# Patient Record
Sex: Female | Born: 1955 | Race: Black or African American | Hispanic: No | Smoking: Former smoker
Health system: Southern US, Community
[De-identification: ages and names within clinical notes are randomized; demographics above are authoritative.]

## PROBLEM LIST (undated history)

## (undated) DIAGNOSIS — C3490 Malignant neoplasm of unspecified part of unspecified bronchus or lung: Secondary | ICD-10-CM

## (undated) DIAGNOSIS — J449 Chronic obstructive pulmonary disease, unspecified: Secondary | ICD-10-CM

## (undated) DIAGNOSIS — M539 Dorsopathy, unspecified: Secondary | ICD-10-CM

## (undated) DIAGNOSIS — I10 Essential (primary) hypertension: Secondary | ICD-10-CM

## (undated) DIAGNOSIS — F32A Depression, unspecified: Secondary | ICD-10-CM

## (undated) DIAGNOSIS — F1990 Other psychoactive substance use, unspecified, uncomplicated: Secondary | ICD-10-CM

## (undated) DIAGNOSIS — G4730 Sleep apnea, unspecified: Secondary | ICD-10-CM

## (undated) DIAGNOSIS — E119 Type 2 diabetes mellitus without complications: Secondary | ICD-10-CM

## (undated) DIAGNOSIS — Z8489 Family history of other specified conditions: Secondary | ICD-10-CM

## (undated) DIAGNOSIS — I729 Aneurysm of unspecified site: Secondary | ICD-10-CM

## (undated) DIAGNOSIS — R0781 Pleurodynia: Secondary | ICD-10-CM

## (undated) DIAGNOSIS — E7800 Pure hypercholesterolemia, unspecified: Secondary | ICD-10-CM

## (undated) DIAGNOSIS — F329 Major depressive disorder, single episode, unspecified: Secondary | ICD-10-CM

## (undated) DIAGNOSIS — G43909 Migraine, unspecified, not intractable, without status migrainosus: Secondary | ICD-10-CM

## (undated) DIAGNOSIS — K449 Diaphragmatic hernia without obstruction or gangrene: Secondary | ICD-10-CM

## (undated) HISTORY — DX: Diaphragmatic hernia without obstruction or gangrene: K44.9

## (undated) HISTORY — DX: Aneurysm of unspecified site: I72.9

## (undated) HISTORY — PX: EYE SURGERY: SHX253

## (undated) HISTORY — DX: Dorsopathy, unspecified: M53.9

## (undated) HISTORY — DX: Essential (primary) hypertension: I10

## (undated) HISTORY — PX: ABDOMINAL HYSTERECTOMY: SHX81

## (undated) HISTORY — DX: Other psychoactive substance use, unspecified, uncomplicated: F19.90

## (undated) HISTORY — DX: Family history of other specified conditions: Z84.89

## (undated) HISTORY — DX: Major depressive disorder, single episode, unspecified: F32.9

## (undated) HISTORY — DX: Migraine, unspecified, not intractable, without status migrainosus: G43.909

## (undated) HISTORY — DX: Pure hypercholesterolemia, unspecified: E78.00

## (undated) HISTORY — DX: Depression, unspecified: F32.A

## (undated) HISTORY — PX: HERNIA REPAIR: SHX51

## (undated) HISTORY — DX: Chronic obstructive pulmonary disease, unspecified: J44.9

## (undated) HISTORY — DX: Sleep apnea, unspecified: G47.30

## (undated) HISTORY — DX: Type 2 diabetes mellitus without complications: E11.9

## (undated) HISTORY — DX: Pleurodynia: R07.81

---

## 2004-11-26 ENCOUNTER — Emergency Department: Payer: Self-pay | Admitting: Emergency Medicine

## 2004-11-27 ENCOUNTER — Emergency Department: Payer: Self-pay | Admitting: Emergency Medicine

## 2007-08-20 ENCOUNTER — Emergency Department: Payer: Self-pay | Admitting: Emergency Medicine

## 2007-09-24 ENCOUNTER — Emergency Department: Payer: Self-pay | Admitting: Emergency Medicine

## 2007-09-27 ENCOUNTER — Emergency Department: Payer: Self-pay | Admitting: Emergency Medicine

## 2009-10-17 ENCOUNTER — Inpatient Hospital Stay: Payer: Self-pay | Admitting: Internal Medicine

## 2009-12-04 ENCOUNTER — Inpatient Hospital Stay: Payer: Self-pay | Admitting: Internal Medicine

## 2010-11-18 ENCOUNTER — Emergency Department: Payer: Self-pay | Admitting: Internal Medicine

## 2012-04-03 ENCOUNTER — Emergency Department: Payer: Self-pay | Admitting: Emergency Medicine

## 2012-04-03 LAB — BASIC METABOLIC PANEL
Chloride: 106 mmol/L (ref 98–107)
Co2: 25 mmol/L (ref 21–32)
EGFR (African American): >60
Glucose: 87 mg/dL (ref 65–99)
Potassium: 3.8 mmol/L (ref 3.5–5.1)
Sodium: 140 mmol/L (ref 136–145)

## 2012-04-03 LAB — CBC
HGB: 15.4 g/dL (ref 12.0–16.0)
MCV: 86 fL (ref 80–100)
WBC: 6.6 10*3/uL (ref 3.6–11.0)

## 2012-04-03 LAB — CK TOTAL AND CKMB (NOT AT ARMC): CK, Total: 88 U/L (ref 21–215)

## 2012-04-03 LAB — TROPONIN I: Troponin-I: <0.02 ng/mL

## 2012-07-18 ENCOUNTER — Inpatient Hospital Stay: Payer: Self-pay | Admitting: Specialist

## 2012-07-18 LAB — COMPREHENSIVE METABOLIC PANEL
Alkaline Phosphatase: 116 U/L (ref 50–136)
Anion Gap: 10 (ref 7–16)
Bilirubin,Total: 0.5 mg/dL (ref 0.2–1.0)
Calcium, Total: 9.6 mg/dL (ref 8.5–10.1)
Co2: 23 mmol/L (ref 21–32)
EGFR (African American): >60
EGFR (Non-African Amer.): >60
Glucose: 124 mg/dL — ABNORMAL HIGH (ref 65–99)
Potassium: 3.7 mmol/L (ref 3.5–5.1)
SGPT (ALT): 20 U/L (ref 12–78)
Sodium: 141 mmol/L (ref 136–145)

## 2012-07-18 LAB — URINALYSIS, COMPLETE
Bilirubin,UR: NEGATIVE
Glucose,UR: NEGATIVE mg/dL (ref 0–75)
Leukocyte Esterase: NEGATIVE
Nitrite: NEGATIVE
RBC,UR: 2 /HPF (ref 0–5)
WBC UR: NONE SEEN /HPF (ref 0–5)

## 2012-07-18 LAB — CBC
HGB: 15.2 g/dL (ref 12.0–16.0)
MCH: 27.4 pg (ref 26.0–34.0)
MCHC: 32.4 g/dL (ref 32.0–36.0)
MCV: 85 fL (ref 80–100)
Platelet: 321 10*3/uL (ref 150–440)
RBC: 5.56 10*6/uL — ABNORMAL HIGH (ref 3.80–5.20)
RDW: 15.3 % — ABNORMAL HIGH (ref 11.5–14.5)

## 2012-07-18 LAB — DRUG SCREEN, URINE
Amphetamines, Ur Screen: NEGATIVE (ref ?–1000)
Barbiturates, Ur Screen: NEGATIVE (ref ?–200)
Cannabinoid 50 Ng, Ur ~~LOC~~: POSITIVE (ref ?–50)
MDMA (Ecstasy)Ur Screen: NEGATIVE (ref ?–500)
Methadone, Ur Screen: NEGATIVE (ref ?–300)
Opiate, Ur Screen: POSITIVE (ref ?–300)
Tricyclic, Ur Screen: NEGATIVE (ref ?–1000)

## 2012-07-19 LAB — LIPID PANEL
Cholesterol: 125 mg/dL (ref 0–200)
HDL Cholesterol: 26 mg/dL — ABNORMAL LOW (ref 40–60)
VLDL Cholesterol, Calc: 26 mg/dL (ref 5–40)

## 2012-07-19 LAB — BASIC METABOLIC PANEL
Chloride: 110 mmol/L — ABNORMAL HIGH (ref 98–107)
Co2: 26 mmol/L (ref 21–32)
EGFR (Non-African Amer.): >60
Glucose: 91 mg/dL (ref 65–99)
Potassium: 3.6 mmol/L (ref 3.5–5.1)

## 2012-07-19 LAB — CBC WITH DIFFERENTIAL/PLATELET
Eosinophil %: 1 %
HGB: 12.9 g/dL (ref 12.0–16.0)
Lymphocyte #: 2.8 10*3/uL (ref 1.0–3.6)
Lymphocyte %: 46.6 %
MCH: 27.9 pg (ref 26.0–34.0)
MCV: 86 fL (ref 80–100)
Neutrophil %: 40.3 %
RDW: 15.7 % — ABNORMAL HIGH (ref 11.5–14.5)

## 2012-07-19 LAB — MAGNESIUM: Magnesium: 1.9 mg/dL

## 2012-07-19 LAB — HEMOGLOBIN A1C: Hemoglobin A1C: 6.2 % (ref 4.2–6.3)

## 2012-07-20 LAB — BASIC METABOLIC PANEL
Anion Gap: 9 (ref 7–16)
BUN: 7 mg/dL (ref 7–18)
Calcium, Total: 8.7 mg/dL (ref 8.5–10.1)
Co2: 26 mmol/L (ref 21–32)
EGFR (African American): >60
EGFR (Non-African Amer.): >60
Glucose: 79 mg/dL (ref 65–99)
Osmolality: 282 (ref 275–301)

## 2012-07-22 LAB — CBC WITH DIFFERENTIAL/PLATELET
Basophil %: 0.6 %
Eosinophil #: 0.2 10*3/uL (ref 0.0–0.7)
Eosinophil %: 3.1 %
HGB: 13.1 g/dL (ref 12.0–16.0)
Lymphocyte #: 2.2 10*3/uL (ref 1.0–3.6)
Lymphocyte %: 44 %
MCH: 27.8 pg (ref 26.0–34.0)
MCHC: 33.3 g/dL (ref 32.0–36.0)
MCV: 84 fL (ref 80–100)
Monocyte %: 13.2 %
Neutrophil #: 2 10*3/uL (ref 1.4–6.5)
Neutrophil %: 39.1 %
Platelet: 238 10*3/uL (ref 150–440)
RDW: 15 % — ABNORMAL HIGH (ref 11.5–14.5)
WBC: 5.1 10*3/uL (ref 3.6–11.0)

## 2012-07-22 LAB — PROTIME-INR
INR: 1
Prothrombin Time: 13.6 secs (ref 11.5–14.7)

## 2012-07-23 LAB — BASIC METABOLIC PANEL
Anion Gap: 4 — ABNORMAL LOW (ref 7–16)
BUN: 4 mg/dL — ABNORMAL LOW (ref 7–18)
Calcium, Total: 9.2 mg/dL (ref 8.5–10.1)
Chloride: 108 mmol/L — ABNORMAL HIGH (ref 98–107)
Co2: 32 mmol/L (ref 21–32)
Creatinine: 0.84 mg/dL (ref 0.60–1.30)
EGFR (African American): >60
EGFR (Non-African Amer.): >60
Potassium: 3.5 mmol/L (ref 3.5–5.1)
Sodium: 144 mmol/L (ref 136–145)

## 2012-07-23 LAB — CBC WITH DIFFERENTIAL/PLATELET
Basophil #: 0.1 10*3/uL (ref 0.0–0.1)
Basophil %: 1.3 %
Eosinophil %: 3.6 %
HCT: 40.3 % (ref 35.0–47.0)
Lymphocyte #: 2.6 10*3/uL (ref 1.0–3.6)
MCH: 28.1 pg (ref 26.0–34.0)
MCHC: 33.3 g/dL (ref 32.0–36.0)
MCV: 85 fL (ref 80–100)
Monocyte #: 0.6 x10 3/mm (ref 0.2–0.9)
Monocyte %: 11.4 %
Neutrophil #: 1.8 10*3/uL (ref 1.4–6.5)
Neutrophil %: 33.4 %

## 2012-10-13 ENCOUNTER — Emergency Department: Payer: Self-pay | Admitting: Emergency Medicine

## 2012-11-25 ENCOUNTER — Ambulatory Visit: Payer: Self-pay | Admitting: Urgent Care

## 2012-12-22 ENCOUNTER — Ambulatory Visit: Payer: Self-pay | Admitting: Nurse Practitioner

## 2013-01-27 ENCOUNTER — Ambulatory Visit: Payer: Self-pay | Admitting: Pain Medicine

## 2013-02-15 ENCOUNTER — Ambulatory Visit: Payer: Self-pay | Admitting: Pain Medicine

## 2013-02-15 LAB — MAGNESIUM: Magnesium: 1.6 mg/dL — ABNORMAL LOW

## 2013-02-15 LAB — BASIC METABOLIC PANEL
Calcium, Total: 9 mg/dL (ref 8.5–10.1)
Chloride: 115 mmol/L — ABNORMAL HIGH (ref 98–107)
Co2: 21 mmol/L (ref 21–32)
Creatinine: 0.96 mg/dL (ref 0.60–1.30)
EGFR (African American): >60
EGFR (Non-African Amer.): >60
Glucose: 85 mg/dL (ref 65–99)
Osmolality: 283 (ref 275–301)
Sodium: 141 mmol/L (ref 136–145)

## 2013-04-05 ENCOUNTER — Ambulatory Visit: Payer: Self-pay | Admitting: Pain Medicine

## 2013-04-08 ENCOUNTER — Ambulatory Visit: Payer: Self-pay | Admitting: Pain Medicine

## 2013-04-26 ENCOUNTER — Ambulatory Visit: Payer: Self-pay | Admitting: Pain Medicine

## 2013-04-29 ENCOUNTER — Ambulatory Visit: Payer: Self-pay | Admitting: Pain Medicine

## 2013-05-13 ENCOUNTER — Ambulatory Visit: Payer: Self-pay | Admitting: Pain Medicine

## 2013-05-24 ENCOUNTER — Ambulatory Visit: Payer: Self-pay | Admitting: Pain Medicine

## 2013-06-22 ENCOUNTER — Encounter: Payer: Self-pay | Admitting: Pain Medicine

## 2013-06-23 ENCOUNTER — Encounter: Payer: Self-pay | Admitting: Pain Medicine

## 2013-07-23 ENCOUNTER — Encounter: Payer: Self-pay | Admitting: Pain Medicine

## 2013-08-23 ENCOUNTER — Encounter: Payer: Self-pay | Admitting: Pain Medicine

## 2013-09-08 ENCOUNTER — Ambulatory Visit: Payer: Self-pay | Admitting: Pain Medicine

## 2013-09-21 ENCOUNTER — Ambulatory Visit: Payer: Self-pay | Admitting: Pain Medicine

## 2013-12-01 ENCOUNTER — Ambulatory Visit: Payer: Self-pay | Admitting: Pain Medicine

## 2013-12-07 ENCOUNTER — Ambulatory Visit: Payer: Self-pay | Admitting: Pain Medicine

## 2014-01-25 ENCOUNTER — Ambulatory Visit: Payer: Self-pay | Admitting: Pain Medicine

## 2014-02-08 ENCOUNTER — Ambulatory Visit: Payer: Self-pay | Admitting: Pain Medicine

## 2014-05-05 ENCOUNTER — Ambulatory Visit: Payer: Self-pay | Admitting: Nurse Practitioner

## 2014-06-26 ENCOUNTER — Emergency Department
Admit: 2014-06-26 | Disposition: A | Discharge status: Home / Self Care | Payer: Self-pay | Admitting: Emergency Medicine

## 2014-07-15 NOTE — Consult Note (Signed)
Chief Complaint:  Subjective/Chief Complaint seen for epigastric/generalized pain.  continues wiwth epigastric pain.  tolerated prep for colonoscopy well.  No n/v.  no blood seen in the prep.   VITAL SIGNS/ANCILLARY NOTES: **Vital Signs.:   01-May-14 04:27  Vital Signs Type Routine  Temperature Temperature (F) 98.1  Celsius 36.7  Temperature Source oral  Pulse Pulse 69  Respirations Respirations 18  Systolic BP Systolic BP 151  Diastolic BP (mmHg) Diastolic BP (mmHg) 76  Mean BP 99  Pulse Ox % Pulse Ox % 93  Pulse Ox Activity Level  At rest  Oxygen Delivery Room Air/ 21 %   Brief Assessment:  Cardiac Regular   Respiratory clear BS   Gastrointestinal details normal Soft  No masses palpable  Bowel sounds normal  No rebound tenderness  No gaurding  mild to moderate distension, mild generalized tenderness, moreso in the epigastrum   Lab Results: Routine Chem:  01-May-14 05:01   Glucose, Serum 83  BUN  4  Creatinine (comp) 0.84  Sodium, Serum 144  Potassium, Serum 3.5  Chloride, Serum  108  CO2, Serum 32  Calcium (Total), Serum 9.2  Anion Gap  4  Osmolality (calc) 283  eGFR (African American) >60  eGFR (Non-African American) >60 (eGFR values <58m/min/1.73 m2 may be an indication of chronic kidney disease (CKD). Calculated eGFR is useful in patients with stable renal function. The eGFR calculation will not be reliable in acutely ill patients when serum creatinine is changing rapidly. It is not useful in  patients on dialysis. The eGFR calculation may not be applicable to patients at the low and high extremes of body sizes, pregnant women, and vegetarians.)  Routine Hem:  01-May-14 05:01   WBC (CBC) 5.3  RBC (CBC) 4.76  Hemoglobin (CBC) 13.4  Hematocrit (CBC) 40.3  Platelet Count (CBC) 243  MCV 85  MCH 28.1  MCHC 33.3  RDW  15.2  Neutrophil % 33.4  Lymphocyte % 50.3  Monocyte % 11.4  Eosinophil % 3.6  Basophil % 1.3  Neutrophil # 1.8  Lymphocyte # 2.6   Monocyte # 0.6  Eosinophil # 0.2  Basophil # 0.1 (Result(s) reported on 23 Jul 2012 at [facility])   Radiology Results: XRay:    30-Apr-14 07:59, Abdomen Flat and Erect  Abdomen Flat and Erect   REASON FOR EXAM:    bowel obstruction/pseudoobstruction.  COMMENTS:       PROCEDURE: DXR - DXR ABDOMEN 2 V FLAT AND ERECT  - Jul 22 2012  7:59AM     RESULT: Comparisons:  None    Findings:      Supine and upright views of the abdomen are provided.    There is mild gaseous distention of small bowel and colon. There is no   pathologic dilatation. There are no air-fluid levels. There is no   pathologic calcification along the expected course of the ureters. There   is no evidence of pneumoperitoneum, portal venous gas, or pneumatosis.  The osseous structures are unremarkable.    IMPRESSION:     Unremarkable abdominal radiograph.    Dictation Site: 1        Verified By: HJennette Banker M.D., MD   Assessment/Plan:  Assessment/Plan:  Assessment 1) abdominal pain.  EGD and CT uninformative.   2) multiple medical issues with copd, gerd, h.o brain aneurysm, hysterectomy, depression, hiatal hernia   Plan 1) colonoscopy today.  I have discussed the risks benefits and complications of colonoscopy to include not limited to bleeding infection  perforation and sedation and she wishes to proceed.   further risks to follow.   Electronic Signatures: Loistine Simas (MD)  (Signed 01-May-14 11:25)  Authored: Chief Complaint, VITAL SIGNS/ANCILLARY NOTES, Brief Assessment, Lab Results, Radiology Results, Assessment/Plan   Last Updated: 01-May-14 11:25 by Loistine Simas (MD)

## 2014-07-15 NOTE — Consult Note (Signed)
Chief Complaint:  Subjective/Chief Complaint Covering for Dr. Gustavo Lah. Upper abd pain persists but less than before.   VITAL SIGNS/ANCILLARY NOTES: **Vital Signs.:   02-May-14 13:57  Vital Signs Type Recheck  Systolic BP Systolic BP 505  Diastolic BP (mmHg) Diastolic BP (mmHg) 88  Mean BP 121  BP Source  if not from Vital Sign Device manual   Brief Assessment:  Cardiac Regular   Respiratory clear BS   Gastrointestinal mild upper abd tenderness   Radiology Results: XRay:    02-May-14 11:45, Small Bowel  Small Bowel   REASON FOR EXAM:    abdominal pain.  COMMENTS:       PROCEDURE: FL  - FL SMALL BOWEL  - Jul 24 2012 11:45AM     RESULT: Indication: Abdominal pain    Findings:  Scout frontal abdominal and pelvic radiograph demonstrates no focal   abnormality.    Medium density barium was periodically observed under fluoroscopy to   travel from the stomach to the ascending colon (over a 90 minute time   period). Total fluoroscopy time was 0.2 minutes. There is no evidence of   small bowel stricture or obstruction. No large filling defects to suggest     mass lesion. In addition, there is no evidence of tethering or definite   inflammatory changes present within the small bowel.    IMPRESSION:   Normal small bowel follow-through study without evidence of tethering,   mass lesion, or obstruction within the small bowel.    Dictation Site: 1        Verified By: Jennette Banker, M.D., MD   Assessment/Plan:  Assessment/Plan:  Assessment Abd pain. No obvious etiologies. Min gastritis. Diverticulosis. Nl SB. Nl GB on CT.   Plan Continue carafate. Start bentyl '10mg'$  tid qac. See if patient can tolerate solids. If yes, discharge patient. Have patient f/u with Dr. Gustavo Lah as outpt. Will sign off. Thanks.   Electronic Signatures: Verdie Shire (MD)  (Signed [phone] 14:32)  Authored: Chief Complaint, VITAL SIGNS/ANCILLARY NOTES, Brief Assessment, Radiology Results,  Assessment/Plan   Last Updated: 02-May-14 14:32 by Verdie Shire (MD)

## 2014-07-15 NOTE — Consult Note (Signed)
Details:   - Saw patient in fu for upper abdominal pain, NV: CT and EGD not informative, unable to tolerate egg sandwich yesterday for gastric emptying test. 2 view abd xray unremarkable today. States that she is some better, but continues with upper abdominal pain which she is finding difficult to bear.  No NV at present, states she had bm today: describes as soft and slimy with some mucous. Reports today that this has been happening at home prior to arrival at hospital and is a change for her. Nursing report a bowel movement today that looked like mucous with tiny bright red specks in it- did receive Dulcolax supp about 0130: the bowel movement was several hrs later. Patient states no black or bloody stools, and that she is passing flatus well. States no lower abominal pain. Afebrile, VSS. labs unremarkable. On exam, lungs clear, resps eupniec. No cardiac abnormalities, no edema. Neuro intact. Abdomen with protuberance, bS X4, diffuse tenderness across both upper quadrants. Skin w/d/pink. No history of prior colonoscopy. Family history negative for colorectal cancer, liver disease.  Considering UGI/SBFt v. colonsocopy for abdominal pain/change of bowel habit. Will discuss with Dr Gustavo Lah.  Addendum: Did discuss with Dr Gustavo Lah: rectal exam with a few external hemorrhoids; few irregularities noted inside anal ring. No obvious stool present. Mucosa was heme negative. Will plan for colonoscopy tomorrow to assess pain:? transverse colon issue? I discussed the procedure, indication, risks and benefits with patient and she is agreeable.   Nursing/Ancillary Notes:  Nursing/Ancillary Notes: **Vital Signs.:   30-Apr-14 14:20  Vital Signs Type Routine  Temperature Temperature (F) 98.4  Celsius 36.8  Temperature Source oral  Pulse Pulse 77  Respirations Respirations 18  Systolic BP Systolic BP 161  Diastolic BP (mmHg) Diastolic BP (mmHg) 81  Mean BP 106  Pulse Ox % Pulse Ox % 96  Pulse Ox Activity  Level  At rest  Oxygen Delivery Room Air/ 21 %  *Intake and Output.:   30-Apr-14 11:08  Stool  small, mucus, small specks of light red blood   Lab Results:    Pathology:  29-Apr-14 00:12   Pathology Report ========== TEST NAME ==========  ========= RESULTS =========  = REFERENCE RANGE =  PATHOLOGY REPORT  Pathology Report .                               [   Final Report         ]                   Material submitted:         Marland Kitchen PART A: ANTRAL AND BODY OF STOMACH COLD BIOPSY PART B: GEJ COLD BIOPSY .                               [   Final Report         ]                   Pre-operative diagnosis:                                        . EPIGASTRIC PAIN, NAUSEA AND VOMITING, EGD .                               [  Final Report         ]                   ********************************************************************** Diagnosis: Part A: ANTRUM AND BODY OF STOMACH COLD BIOPSY: - ANTRAL MUCOSA WITH MILD REACTIVE GASTROPATHY. - OXYNTIC MUCOSA WITHOUT PATHOLOGIC CHANGES. - NO ACTIVE INFLAMMATION, ATROPHY, OR INTESTINAL METAPLASIA. - NEGATIVE FOR HELICOBACTER PYLORI ON DIFF-QUIK STAIN. Marland Kitchen Part B: GASTROESOPHAGEAL JUNCTION COLD BIOPSY: - SQUAMOCOLUMNAR-JUNCTION MUCOSA WITH MILD CHRONIC INFLAMMATION. - NEGATIVE FOR GOBLET CELLS, DYSPLASIA AND MALIGNANCY. MSO/07/22/2012 ********************************************************************** .                               [   Final Report         ]                   Electronically signed:                                     Marland Kitchen Vivia Ewing, MD, Pathologist .                               [   Final Report         ]                   Gross description:           . A. Received in a formalin filled container labeled Charlene Williams and antrum and body of stomach cold biopsy are two tan pink soft tissue fragments each 0.3 cm in greatest dimensions. Entirely submitted in cassette A1. . B. Received in a formalin filled container  labeled Charlene Williams and GEJ cold biopsy is a 0.3 cm tan pink soft tissue fragment, entirely submitted in cassette B1. QAC/KCT .                               [   Final Report         ]                   Pathologist provided ICD-9: 535.50 .                               [   Final Report         ]                   CPT                                                        . [id number], [id number], [place]            No: [id number]           [address], [place], [place] [address]           Lindon Romp, MD  [id number]                                         Co: [id number]   Result(s) reported on 22 Jul 2012 at 12:48PM.  Routine Chem:  27-Apr-14 03:40   Glucose, Serum 91  BUN 8  Creatinine (comp) 0.84  Sodium, Serum 142  Potassium, Serum 3.6  Chloride, Serum  110  CO2, Serum 26  Calcium (Total), Serum 8.7  Anion Gap  6  Osmolality (calc) 281  eGFR (African American) >60  eGFR (Non-African American) >60 (eGFR values <80m/min/1.73 m2 may be an indication of chronic kidney disease (CKD). Calculated eGFR is useful in patients with stable renal function. The eGFR calculation will not be reliable in acutely ill patients when serum creatinine is changing rapidly. It is not useful in  patients on dialysis. The eGFR calculation may not be applicable to patients at the low and high extremes of body sizes, pregnant women, and vegetarians.)  Magnesium, Serum 1.9 (1.8-2.4 THERAPEUTIC RANGE: 4-7 mg/dL TOXIC: > 10 mg/dL  -----------------------)  Hemoglobin A1c ([facility]) 6.2 (The American Diabetes Association recommends that a primary goal of therapy should be <7% and that physicians should reevaluate the treatment regimen in patients with HbA1c values consistently >8%.)  Cholesterol, Serum 125  Triglycerides, Serum 131  HDL (INHOUSE)  26  VLDL Cholesterol Calculated 26  LDL Cholesterol Calculated 73 (Result(s) reported  on 19 Jul 2012 at 05:13AM.)  28-Apr-14 04:01   Glucose, Serum 79  BUN 7  Creatinine (comp) 0.79  Sodium, Serum 143  Potassium, Serum 3.5  Chloride, Serum  108  CO2, Serum 26  Calcium (Total), Serum 8.7  Anion Gap 9  Osmolality (calc) 282  eGFR (African American) >60  eGFR (Non-African American) >60 (eGFR values <677mmin/1.73 m2 may be an indication of chronic kidney disease (CKD). Calculated eGFR is useful in patients with stable renal function. The eGFR calculation will not be reliable in acutely ill patients when serum creatinine is changing rapidly. It is not useful in  patients on dialysis. The eGFR calculation may not be applicable to patients at the low and high extremes of body sizes, pregnant women, and vegetarians.)  Routine Hem:  27-Apr-14 03:40   WBC (CBC) 6.0  RBC (CBC) 4.63  Hemoglobin (CBC) 12.9  Hematocrit (CBC) 39.7  Platelet Count (CBC) 262  MCV 86  MCH 27.9  MCHC 32.6  RDW  15.7  Neutrophil % 40.3  Lymphocyte % 46.6  Monocyte % 11.5  Eosinophil % 1.0  Basophil % 0.6  Neutrophil # 2.4  Lymphocyte # 2.8  Monocyte # 0.7  Eosinophil # 0.1  Basophil # 0.0 (Result(s) reported on 19 Jul 2012 at 05:09AM.)   Other Results:  Radiology Results: XRay:    30-Apr-14 07:59, Abdomen Flat and Erect  Abdomen Flat and Erect   REASON FOR EXAM:    bowel obstruction/pseudoobstruction.  COMMENTS:       PROCEDURE: DXR - DXR ABDOMEN 2 V FLAT AND ERECT  - Jul 22 2012  7:59AM     RESULT: Comparisons:  None    Findings:      Supine and upright views of the abdomen are provided.    There is mild gaseous distention of small bowel and colon. There is no   pathologic dilatation. There are no air-fluid levels. There is no   pathologic calcification along the expected course of  the ureters. There   is no evidence of pneumoperitoneum, portal venous gas, or pneumatosis.  The osseous structures are unremarkable.    IMPRESSION:     Unremarkable abdominal  radiograph.    Dictation Site: 1        Verified By: Jennette Banker, M.D., MD  CT:    26-Apr-14 11:52, CT Abdomen and Pelvis With Contrast  CT Abdomen and Pelvis With Contrast   REASON FOR EXAM:    (1) epigastric abd pain; (2) epigastric abd pain  COMMENTS:   May transport without cardiac monitor    PROCEDURE: CT  - CT ABDOMEN / PELVIS  W  - Jul 18 2012 11:52AM     RESULT: History: Epigastric pain    Comparison:  12/04/2009    Technique: Multiple axial images of the abdomen and pelvis were performed   from the lung bases to the pubic symphysis, without p.o. contrast and   with 100 ml of Isovue 370 intravenous contrast.    Findings:  The lung bases are clear. There is no pneumothorax. The heart size is   normal.     The liver demonstrates no focal abnormality. There is no intrahepatic or   extrahepatic biliary ductal dilatation. The gallbladder is unremarkable.   The spleen demonstrates no focal abnormality. There is a17 mm hypodense,   fluid attenuating left inferior pole renal mass most consistent with a   cyst. There is fullness of the left adrenal gland with a focal 13 mm   nodule present, unchanged compared with 12/04/2009. The right kidney,   right adrenal gland and pancreas are normal. The bladder is unremarkable.     There is gastric wall thickening involving the distal body which may   reflect underdistention or peristalsis versus gastritis. The  duodenum,   small intestine, and large intestine demonstrate no dilatation. There is   a normal caliber appendix in the right lower quadrant without     periappendiceal inflammatory changes. There is diverticulosis without   evidence of diverticulitis. There is no pneumoperitoneum, pneumatosis, or   portalvenous gas. There is no abdominal or pelvic free fluid. There is   no lymphadenopathy.     The abdominal aorta is normal in caliber with atherosclerosis.    The osseous structures are unremarkable.    IMPRESSION:      1. There is gastric wall thickening involving the distal body which may   reflect underdistention or peristalsis versus gastritis.    Dictation Site: 1    Verified By: Jennette Banker, M.D., MD  Nuclear Med:    29-Apr-14 10:52, Gastric Emptying Study - Nuc Med  Gastric Emptying Study - Nuc Med   REASON FOR EXAM:    nausea/vomiting/epigastric pain  COMMENTS:       PROCEDURE: NM  - NM GASTRIC EMPTYING STUDY  - Jul 21 2012 10:52AM     RESULT: A gastric emptying study was planned. However, the patient could   tolerate only a small portion of theradiolabeled food substances   normally administered. The study was thus canceled.    IMPRESSION:  Gastric imaging study was not performed. There is no charge   for this study.     Dictation Site: 2      Verified By: DAVID A. Martinique, M.D., MD   Electronic Signatures: Theodore Demark (NP)  (Signed 30-Apr-14 15:28)  Authored: Details, Nursing/Ancillary Notes, Lab Results, Other Results   Last Updated: 30-Apr-14 15:28 by Theodore Demark (NP)

## 2014-07-15 NOTE — Consult Note (Signed)
Brief Consult Note: Diagnosis: nausea vomiting epigastric abdominal pain.   Patient was seen by consultant.   Consult note dictated.   Recommend to proceed with surgery or procedure.   Orders entered.   Comments: Please see full GI consult # [phone].  Patietn with h/o pudz and gerd, presenting with exacerbation of n/v and epigastric/luq pain with CT showing possible gastritis.  Recommend luminal evaluation via egd.  Continue ppi.  Will hold carafate tomorrow.  I have discussed the risks benefits and complications of egd to include not limited to bleeding infection perforation and sedation and she wishes to proceed.  Further recs to follow.  Electronic Signatures: Loistine Simas (MD)  (Signed 27-Apr-14 14:39)  Authored: Brief Consult Note   Last Updated: 27-Apr-14 14:39 by Loistine Simas (MD)

## 2014-07-15 NOTE — Discharge Summary (Signed)
PATIENT NAME:  Charlene Williams, Charlene Williams MR#:  [id number] DATE OF BIRTH:  June 27, 1955  DATE OF ADMISSION:  07/18/2012 DATE OF DISCHARGE:  07/25/2012  Please refer to interim discharge summary dictated by Dr. Verdell Carmine on 07/24/2012.   DISCHARGE DIAGNOSES:  1.  Abdominal pain of unclear etiology upper abdomen, suspected colon spasms.  2.  Malignant hypertension.  3.  Diabetes mellitus.  4.  Hyperlipidemia.  5.  Depression.  6.  Chronic obstructive pulmonary disease.   DISCHARGE CONDITION:  Stable.   DISCHARGE MEDICATIONS:  The patient is to continue:  1.  Proventil HFA CFC free 2 puffs 4 times daily as needed.  2.  Amitriptyline 25 mg by mouth at bedtime.  3.  Cymbalta 60 mg 2 capsules once daily.  4.  Flovent HFA CFC free 220 mcg inhalation aerosol 2 puffs 4 times daily.  5.  Gabapentin 300 mg by mouth 4 times daily. 6.  Maxalt 5 mg by mouth daily as needed.  7.  Metformin 850 mg by mouth once daily.  8.  Multivitamins once daily.  9.  Promethazine 25 mg every 8 hours as needed.  10.  Simvastatin 40 mg by mouth at bedtime.  11.  Ultram 50 mg by mouth 3 times daily. 12.  Q-var 2 puffs twice daily.  13.  Claritin 10 mg by mouth daily.  14.  Acetaminophen hydrocodone 325/5 mg 1 tablet every 4 hours as needed.  15.  Enalapril 10 mg by mouth daily.  16.  Zofran 4 mg every 8 hours as needed.  17.  Metoprolol tartrate 25 mg by mouth twice daily.  18.  Dicyclomine 10 mg by mouth every 8 hours.  19.  Docusate sodium 100 mg by mouth twice daily.  20.  Sucralfate 1 gram 4 times daily before meals and at bedtime.  21.  Pantoprazole 40 mg by mouth twice daily.  22.  Nicotine transdermal film 21 mg topically daily.   DIET:  2 gram salt, low fat, low cholesterol, carbohydrate-controlled diet.  Diet consistency regular.   ACTIVITY LIMITATIONS:  As tolerated.   FOLLOW-UP APPOINTMENT:  With Dr. Gustavo Lah in two days after discharge.    CONSULTANTS:  Dr. Gustavo Lah, Dr. Candace Cruise.    Please refer again to  interim discharge summary by Dr. Verdell Carmine.    HOSPITAL COURSE:  On 07/25/2012, the patient felt somewhat better.  She did however still complain of upper abdominal pains.  She was concerned that she was never worked up for gallbladder disease so ultrasound of her abdomen, limited survey, was performed on 07/25/2012 to better evaluate her gallbladder.  Her liver was found to be normal.  Portal vein was patent.  Pancrease was also normal.  Gallbladder was found to be normal.  Gallbladder wall thickness was found to be 1.6 mm.  The common bile duct caliber was 4.7 mm.  No acute abnormality was found.  It was felt that patient's abdominal discomfort was of unclear etiology.  The patient is to continue Bentyl.  It was suspected that colon spasms could be causing that discomfort.    For malignant hypertension, the patient's blood pressure medications were advanced to current levels and her blood pressure was much better controlled today on 07/25/2012.  By the day of discharge temperature was 98.3, pulse was 63, respiratory rate was 18, blood pressure 130/70, O2 saturation was 95% to 96% on room air at rest.  The patient was advised to continue her medication doses and follow up with her primary care physician  and make decisions about advancement if needed.    For diabetes mellitus, she is to continue her outpatient management.   For hyperlipidemia, she is to continue a low-fat, low-cholesterol diet as well as simvastatin.    For depression, she is to continue amitriptyline as well as Cymbalta.   For COPD, she is to stop smoking and that was discussed with her extensively.  She is to continue inhalation therapy as well.  Her oxygenation was good and no oxygen was prescribed for her upon discharge.  The patient is being discharged in stable condition with the above-mentioned medications and follow-up.   TIME SPENT:  40 minutes.    ____________________________ Theodoro Grist, MD rv:ea D: 07/25/2012 19:09:59  ET T: 07/26/2012 03:05:16 ET JOB#: [id number]  cc: Theodoro Grist, MD, <Dictator> Lollie Sails, MD [place] MD ELECTRONICALLY SIGNED 08/06/2012 18:12

## 2014-07-15 NOTE — Consult Note (Signed)
Chief Complaint:  Subjective/Chief Complaint seen for nausea, vomiting and pain.  Patient did nto tolerate gastric emptying study today.  C/o generalized abdominal discomfort and some distension.   VITAL SIGNS/ANCILLARY NOTES: **Vital Signs.:   29-Apr-14 14:00  Vital Signs Type Routine  Temperature Temperature (F) 99.1  Celsius 37.2  Temperature Source oral  Pulse Pulse 67  Respirations Respirations 19  Systolic BP Systolic BP 702  Diastolic BP (mmHg) Diastolic BP (mmHg) 87  Mean BP 110  Pulse Ox % Pulse Ox % 100  Pulse Ox Activity Level  At rest  Oxygen Delivery Room Air/ 21 %   Brief Assessment:  Cardiac Regular   Respiratory clear BS   Gastrointestinal details normal Bowel sounds normal  mild to moderate distension, mild generalized discomfort to palpation.   Lab Results: Routine Chem:  28-Apr-14 04:01   Glucose, Serum 79  BUN 7  Creatinine (comp) 0.79  Sodium, Serum 143  Potassium, Serum 3.5  Chloride, Serum  108  CO2, Serum 26  Calcium (Total), Serum 8.7  Anion Gap 9  Osmolality (calc) 282  eGFR (African American) >60  eGFR (Non-African American) >60 (eGFR values <21m/min/1.73 m2 may be an indication of chronic kidney disease (CKD). Calculated eGFR is useful in patients with stable renal function. The eGFR calculation will not be reliable in acutely ill patients when serum creatinine is changing rapidly. It is not useful in  patients on dialysis. The eGFR calculation may not be applicable to patients at the low and high extremes of body sizes, pregnant women, and vegetarians.)   Radiology Results: XRay:    29-Apr-14 13:35, Abdomen Complete 3 or more views of abd  Abdomen Complete 3 or more views of abd   REASON FOR EXAM:    abdomain pain and distention  COMMENTS:       PROCEDURE: DXR - DXR ABDOMEN COMPLETE  - Jul 21 2012  1:35PM     RESULT: The bowel gas pattern is nonspecific. There are moderate amounts   of gas within the colon with smaller  amountsin the small bowel loops.   There is no gas in the rectum. No free extraluminal gas collections are   demonstrated.    IMPRESSION:  There is mild distention of the colon with small amounts of   small bowel gas as well. There is stool in the right colon. I do not see   evidence of obstruction or perforation. Followup films are recommended.     Dictation Site: 2    Verified By: DAVID A. JMartinique M.D., MD  Nuclear Med:    29-Apr-14 10:52, Gastric Emptying Study - Nuc Med  Gastric Emptying Study - Nuc Med   REASON FOR EXAM:    nausea/vomiting/epigastric pain  COMMENTS:       PROCEDURE: NM  - NM GASTRIC EMPTYING STUDY  - Jul 21 2012 10:52AM     RESULT: A gastric emptying study was planned. However, the patient could   tolerate only a small portion of theradiolabeled food substances   normally administered. The study was thus canceled.    IMPRESSION:  Gastric imaging study was not performed. There is no charge   for this study.     Dictation Site: 2      Verified By: DAVID A. JMartinique M.D., MD   Assessment/Plan:  Assessment/Plan:  Assessment 1) abdominal pain, disension.  EGD uninformative, did not tolerate gstric emptying study.  abd films today indicate possible early obstruction/pseudoobstruction.  etiology uncertain. Patient has had abdominal surgery  in the past (hysterectomy) and would have higher risk for bowel obstruction (sbo) however pattern is mixed small and large bowel.  Abdominal CT done several days ago showed no evidence of obstructing lesion.   Plan 1) limit diet to clears.  serial abdomen 2 ways films.  following.   Electronic Signatures: Loistine Simas (MD)  (Signed 29-Apr-14 17:22)  Authored: Chief Complaint, VITAL SIGNS/ANCILLARY NOTES, Brief Assessment, Lab Results, Radiology Results, Assessment/Plan   Last Updated: 29-Apr-14 17:22 by Loistine Simas (MD)

## 2014-07-15 NOTE — H&P (Signed)
PATIENT NAME:  Charlene Williams, Charlene Williams MR#:  [id number] DATE OF BIRTH:  1956/01/30  DATE OF ADMISSION:  07/18/2012  REFERRING PHYSICIAN: Desiree Lucy. Jasmine December, MD  PRIMARY CARE PHYSICIAN: [facility]; however, she will start seeing [facility] starting the 30th of this month.    CHIEF COMPLAINT: Nausea, vomiting, abdominal pain.  HISTORY OF PRESENT ILLNESS: The patient is a 59 year old African-American female with history of COPD, brain aneurysm, GERD, hiatal hernia, who came in with nausea, vomiting and epigastric abdominal pain for 2 days. The patient has intractable vomiting and vomits whatever she takes in and has had difficulty taking in her pills. She denies any diarrhea, but has epigastric pain which was 10 out of 10. She has vomited several times, more than five, in the last couple of days. No hematemesis. No fevers, chills or sick contacts or any new restaurants or new foods. She received several doses of Dilaudid while here, and a CT scan of abdomen and pelvis did show potential gastritis. Lipase is within normal limits. Hospitalist service was contacted for further evaluation and management. The patient also was noted to have significantly elevated blood pressure and complained of dizziness for the last couple of days. Blood pressure here was as high as 240/108; however, it has come down with medications and control of her pain. Her dizziness has resolved.   PAST MEDICAL HISTORY:  1. Hypertension. 2. COPD. 3. Brain aneurysm. 4. GERD. 5. Hiatal hernia.  6. Depression. 7. History of duodenitis in 2011, status post EGD.  8. History of partial hysterectomy.   FAMILY HISTORY: Mom with congestive heart failure, hypertension and diabetes. Daughter with valve disease, status post open heart surgery.   SOCIAL HISTORY: Still smokes, a pack lasts 3 to 4 days, smoking since age 29. Occasional marijuana use. Disabled.   OUTPATIENT MEDICATIONS:  1. Advair 250/50 inhaled 1 puff b.i.d.  2. QVAR  unknown dose either 40 or 80.  3. Ventolin p.r.n. 2 puffs 4 times a day as needed.  4. Amitriptyline 25 mg at bedtime.  5. Aspirin 81 mg daily. 6. Cymbalta 120 mg daily.  7. Enalapril 5 mg daily,. 8. Flovent 2 puffs 4 times a day.  9. Gabapentin 300 mg 4 times a day.  10. Hydrochlorothiazide 25 mg daily. 11. Maxalt 5 mg 1 tab daily as needed.  12. Metformin 850 mg daily.  13. Multivitamin 1 tab daily.  14. Omeprazole 20 mg 2 times a day.  15. Promethazine 25 mg every 8 hours as needed.  16. Simvastatin 40 mg daily. 17. Ultram 50 mg every 4 hours as needed for pain. 18. Zofran p.r.n.   REVIEW OF SYSTEMS:   CONSTITUTIONAL: Positive for fatigue.  EYES: No blurry vision, double vision or redness.  ENT: No tinnitus. Did have off-and-on right ear fullness for the past week or so. No discharge. No snoring.  RESPIRATORY: No cough, wheezing, shortness of breath. Has history of COPD.  CARDIOVASCULAR: No chest pain. Occasional swelling in the legs. Has high blood pressure. No syncope. No palpitations.  GASTROINTESTINAL: Positive for nausea, vomiting, abdominal pain, epigastric, 10 out of 10, which is better now. No hematemesis. No bloody stools or melena. History of GERD and duodenitis in the past.  GENITOURINARY: Denies dysuria or hematuria.  HEMATOLOGIC AND LYMPHATIC: No anemia or easy bruising.  SKIN: No rashes.  MUSCULOSKELETAL: Has chronic back pain.  NEUROLOGIC: No numbness, weakness or strokes.  PSYCHIATRIC: Has anxiety and depression.   PHYSICAL EXAMINATION:  VITALS: Temperature on arrival 98.2,  pulse 100, respiratory 22. Initial blood pressure 229/108, last blood pressure 152/71, O2 saturation 100% on room air.  GENERAL: The patient is an obese African-American female sitting in bed, in no obvious distress.  HEENT: Normocephalic, atraumatic. Pupils are equal and reactive. Anicteric sclerae. Extraocular muscles intact. Dry mucous membranes. Examination of the right ear: I could see  good light reflex, positive for earwax.  NECK: Supple. No thyroid tenderness. No cervical lymphadenopathy.  CARDIOVASCULAR: S1, S2, regular rate and rhythm. No significant murmurs, rubs or gallops.  LUNGS: Clear to auscultation without wheezing or rhonchi.  ABDOMEN: Soft. Significant abdominal tenderness, epigastric, to palpation, without rebound or guarding. Hyperactive bowel sounds. No organomegaly appreciated.  EXTREMITIES: No significant lower extremity edema.  SKIN: No obvious rashes.  NEUROLOGIC: Cranial nerves II through XII appear to be grossly intact. Strength is 5 out of 5 in all extremities. Sensation is intact to light touch.  PSYCHIATRIC: Awake, alert, oriented x3. Cooperative, pleasant.   LABORATORY DATA: Glucose 124, BUN 14, creatinine 0.91, sodium 141, potassium 3.7, chloride 108, serum CO2 23. Lipase 147. LFTs within normal limits. Troponin negative. Hemoglobin was 15.2, hematocrit was 47, WBC of 6.6, platelets are 321. Lactic acid is 1. CT of abdomen and pelvis with contrast shows gastric wall thickening involving the distal body, which may reflect under distention or peristalsis versus gastritis. EKG shows normal sinus rhythm, rate is 86. No acute ST elevations or depressions. Possible left atrial enlargement.   ASSESSMENT AND PLAN: We have a 59 year old African-American female with history of depression, gastroesophageal reflux disease, hiatal hernia, chronic obstructive pulmonary disease, still smokes, who came in with nausea, vomiting, which appears to be intractable, with epigastric abdominal pain, with accelerated hypertension with dizziness. At this point, we will admit the patient to the hospital. In regards to the nausea and vomiting, this is likely secondary to gastritis seen on the CAT scan. Lipase is within normal limits. There are no sick contacts. No fevers or diarrhea. She does have a history of EGD showing duodenitis back in 2011. At this point, I will start the patient  on IV fluids, morphine for pain control, Zofran and a PPI IV b.i.d., and if symptoms are persistent, would consider a GI consult. The patient did have uncontrolled hypertension, likely from medication not being absorbed from persistent vomiting. She did have dizziness and significant elevation of blood pressure on arrival, now better with the resolution of dizziness. At this point, would add hydralazine IV to the patient's medications q.4 hours p.r.n. In regards to her diabetes, would hold metformin and start the patient on sliding scale insulin and check a hemoglobin A1c. Will check a lipid profile and resume her statin. Continue her depression medications. Would continue her Advair. Add albuterol p.r.n. for chronic obstructive pulmonary disease, which appears to be stable; however, the patient still smokes, and she was counseled for 3 minutes about smoking cessation, and she does want a patch at this point, which we would order. Will start her on heparin for DVT prophylaxis and start her on clears and see how she does and advance diet as tolerated.   CODE STATUS: The patient is full code.   TOTAL TIME SPENT: 60 minutes.   ____________________________ Vivien Presto, MD sa:OSi D: 07/18/2012 13:48:23 ET T: 07/18/2012 14:03:23 ET JOB#: [id number]  cc: Vivien Presto, MD, <Dictator> [facility], [facility] K[facility] MD ELECTRONICALLY SIGNED 08/25/2012 12:26

## 2014-07-15 NOTE — Consult Note (Signed)
Chief Complaint:  Subjective/Chief Complaint seen for nausea vomiting and epigastric pain.Marland Kitchen less n, no v, continues with epigastric pain.   VITAL SIGNS/ANCILLARY NOTES: **Vital Signs.:   28-Apr-14 10:18  Vital Signs Type Routine  Temperature Temperature (F) 98.4  Celsius 36.8  Temperature Source oral  Pulse Pulse 69  Respirations Respirations 18  Systolic BP Systolic BP 175  Diastolic BP (mmHg) Diastolic BP (mmHg) 83  Mean BP 106  Pulse Ox % Pulse Ox % 96  Pulse Ox Activity Level  At rest  Oxygen Delivery Room Air/ 21 %   Brief Assessment:  Cardiac Regular   Respiratory clear BS   Gastrointestinal details normal Soft  Nondistended  No masses palpable  Bowel sounds normal  No rebound tenderness  tender to palpation throughout the epigastrum, mostly centrally/epigastric.   Lab Results: Routine Chem:  28-Apr-14 04:01   Glucose, Serum 79  BUN 7  Creatinine (comp) 0.79  Sodium, Serum 143  Potassium, Serum 3.5  Chloride, Serum  108  CO2, Serum 26  Calcium (Total), Serum 8.7  Anion Gap 9  Osmolality (calc) 282  eGFR (African American) >60  eGFR (Non-African American) >60 (eGFR values <69m/min/1.73 m2 may be an indication of chronic kidney disease (CKD). Calculated eGFR is useful in patients with stable renal function. The eGFR calculation will not be reliable in acutely ill patients when serum creatinine is changing rapidly. It is not useful in  patients on dialysis. The eGFR calculation may not be applicable to patients at the low and high extremes of body sizes, pregnant women, and vegetarians.)   Radiology Results: CT:    26-Apr-14 11:52, CT Abdomen and Pelvis With Contrast  CT Abdomen and Pelvis With Contrast   REASON FOR EXAM:    (1) epigastric abd pain; (2) epigastric abd pain  COMMENTS:   May transport without cardiac monitor    PROCEDURE: CT  - CT ABDOMEN / PELVIS  W  - Jul 18 2012 11:52AM     RESULT: History: Epigastric pain    Comparison:   12/04/2009    Technique: Multiple axial images of the abdomen and pelvis were performed   from the lung bases to the pubic symphysis, without p.o. contrast and   with 100 ml of Isovue 370 intravenous contrast.    Findings:  The lung bases are clear. There is no pneumothorax. The heart size is   normal.     The liver demonstrates no focal abnormality. There is no intrahepatic or   extrahepatic biliary ductal dilatation. The gallbladder is unremarkable.   The spleen demonstrates no focal abnormality. There is a17 mm hypodense,   fluid attenuating left inferior pole renal mass most consistent with a   cyst. There is fullness of the left adrenal gland with a focal 13 mm   nodule present, unchanged compared with 12/04/2009. The right kidney,   right adrenal gland and pancreas are normal. The bladder is unremarkable.     There is gastric wall thickening involving the distal body which may   reflect underdistention or peristalsis versus gastritis. The  duodenum,   small intestine, and large intestine demonstrate no dilatation. There is   a normal caliber appendix in the right lower quadrant without     periappendiceal inflammatory changes. There is diverticulosis without   evidence of diverticulitis. There is no pneumoperitoneum, pneumatosis, or   portalvenous gas. There is no abdominal or pelvic free fluid. There is   no lymphadenopathy.     The abdominal aorta is  normal in caliber with atherosclerosis.    The osseous structures are unremarkable.    IMPRESSION:     1. There is gastric wall thickening involving the distal body which may   reflect underdistention or peristalsis versus gastritis.    Dictation Site: 1    Verified By: Jennette Banker, M.D., MD   Assessment/Plan:  Assessment/Plan:  Assessment 1) epigastric pain , n/v.  abnormal CT abdomen with possible gstritis. on iv ppi bid and carafate   Plan 1) egd today, further recs to follow.   Electronic  Signatures: Loistine Simas (MD)  (Signed 28-Apr-14 14:50)  Authored: Chief Complaint, VITAL SIGNS/ANCILLARY NOTES, Brief Assessment, Lab Results, Radiology Results, Assessment/Plan   Last Updated: 28-Apr-14 14:50 by Loistine Simas (MD)

## 2014-07-15 NOTE — Consult Note (Signed)
PATIENT NAME:  Charlene Williams, Charlene Williams MR#:  [id number] DATE OF BIRTH:  [birth year]  GASTROENTEROLOGY CONSULTATION  DATE OF CONSULTATION:  07/19/2012  CONSULTING PHYSICIAN:  Lollie Sails, MD  Patient of Dr. Bridgette Habermann.  REASON FOR CONSULTATION: Nausea, vomiting, and epigastric abdominal pain.   HISTORY OF PRESENT ILLNESS: The patient is a 59 year old African American female who presented to the Emergency Room yesterday with a complaint of epigastric abdominal pain, nausea and vomiting. She states that she has some chronic issues with nausea for which she does take some type of antinausea tablets, this for some time, however the vomiting is more so new. She states she has no heartburn or dysphagia, but she does say that she has reflux for which she takes omeprazole twice a day. Occasionally she will only take it once a day. She states that she had been having some increased symptoms of nausea and some epigastric pain over a period of the past 4 to 5 days, this increasing over the past 3 days. The increased symptoms necessitated her coming to the hospital.   She states she has a bowel movement daily. There has been no change to the bowel habits. No black stools, blood in the stools, or slimy stools. The patient takes no NSAIDs. She does have a remote history of peptic ulcer disease. She has never had a colonoscopy in the past, however in the past she has had an EGD showing gastritis. Her appetite has been recently decreased, and about a 10-pound weight loss over the period of the past month. The patient does use cannabis and was positive for this on a urine drug screen.   GI FAMILY HISTORY: Negative for colorectal cancer, liver disease, or ulcers.   PAST MEDICAL HISTORY:  The patient has a history of multiple medical issues:   1.  Hypertension.  2.  Chronic obstructive pulmonary disease.  3.  History of brain aneurysm.  4.  Gastroesophageal reflux.  5.  Hiatal hernia.  6.  Depression.  7.   History of duodenitis with EGD in 2011.  8.  History of hysterectomy.   SOCIAL HISTORY: The patient states that she smokes about 1/2 pack cigarettes a day unless she is nervous, and will smoke up to a full pack at that time. She denies use of alcohol for many years. Her daughters are ages 41 and 29, and apparently she stopped drinking when she had her children.   OUTPATIENT MEDICATIONS: Her outpatient medications include: Advair Diskus 250/50,  1 puff twice a day, amitriptyline 25 mg at night, 81 mg aspirin daily, Claritin 10 mg a day, Cymbalta 60 mg, 2 capsules once a day, enalapril 5 mg twice a day, Flovent HFA, gabapentin 300 mg one 4 times a day, hydrochlorothiazide 25 mg once a day, Maxalt 5 mg once a day, metformin 850 mg once a day, multiple vitamin, omeprazole 20 mg twice a day, promethazine 25 mg every 8 hours p.r.n., Proventil HFA, QVAR 80 mcg inhalation twice a day, simvastatin  40 mg once a day, Ultram 50 mg 3 times a day.    ALLERGIES: She is allergic to PENICILLIN.   REVIEW OF SYSTEMS: 10 systems reviewed per admission history and physical; agree with same.   PHYSICAL EXAMINATION: VITAL SIGNS: Temperature is 98, pulse 70, respirations 20, blood pressure 130/78, pulse ox 95%.  GENERAL: She is a 59 year old female in no acute distress.  HEENT: Normocephalic, atraumatic.  EYES: Anicteric.  NOSE: Septum midline. No lesions.  OROPHARYNX: No lesions.  NECK:  Supple. No JVD. No lymphadenopathy.  HEART: Regular rate and rhythm, without rub or gallop.  LUNGS: Bilaterally clear.  ABDOMEN: Soft. She is tender to palpation throughout, but mostly in the epigastric region to the left upper quadrant. There are no masses, rebound, or organomegaly. Bowel sounds positive, normoactive. There is no rebound.  EXTREMITIES: No clubbing, cyanosis, or edema.  NEUROLOGICAL: Cranial nerves II-XII grossly intact.   RECTAL: Anorectal exam deferred.   LABORATORIES: Include the following: On admission to  the hospital she had a glucose of 124, BUN 14, creatinine 0.91, sodium 141, potassium 3.7, chloride 108, bicarbonate 23, calcium 9.6, lipase 147.   Hepatic profile normal.   Cardiac enzymes x 1: Troponin I less than 0.02.   Urinalysis positive for opiate as well as cannabinoids; negative for amphetamines, barbiturates, benzodiazepines, cocaine, methadone, phencyclidine, tricyclic antidepressants and MDMA.   Hemogram on admission showed a white count of 6.6, H and H 15.2/47.0, platelet count of 321, MCV is 85. Repeat hemogram today showed white count of 6.0; otherwise normal.   Urinalysis was 1+ ketones, negative leukocyte esterase, negative nitrite.   She had a CT scan of the abdomen and pelvis in regards to abdominal pain, this showing gastric wall to be thickened in the distal body, which may reflect either under-distention, peristalsis, or gastritis. The duodenum and large and small intestines showed no dilatation.   ASSESSMENT:  Patient with a history of gastroesophageal reflux, apparently taking omeprazole occasionally once a day, occasionally twice a day. History of chronic nausea for which she takes Phenergan. Recent exacerbation of symptoms of epigastric pain, nausea and vomiting, as noted. CT showing possibility of gastritis. The patient has had duodenitis in the past, and peptic ulcer disease, remote.   RECOMMENDATION: Continue IV PPI b.i.d. Will proceed with EGD when clinically feasible; hopefully we will be able get her on the schedule for tomorrow. I have discussed the risks, benefits, and complications of EGD to include, but not limited to: Bleeding, infection, perforation, and the risk of sedation. She wishes to proceed.    Due to her current medications I will need anesthesia assistance with this case. It is of note, the patient has also been placed on Carafate. Will hold his starting tomorrow until her EGD is done.     ____________________________ Lollie Sails,  MD mus:dm D: 07/19/2012 14:35:13 ET T: 07/19/2012 14:53:29 ET JOB#: [id number]  cc: Lollie Sails, MD, <Dictator> Lollie Sails MD ELECTRONICALLY SIGNED 08/04/2012 13:03

## 2014-07-15 NOTE — Consult Note (Signed)
Chief Complaint:  Subjective/Chief Complaint Please see colonoscopy-findings of diverticulosis, negative otherwise.  Of note is a marked spasm of the lower/right colon.  Recommend bentyl prn for abdominal pain. continue ppi.  If patietn is continuing to have pain, do small bowel series.  Dr Candace Cruise to cover tomorrow and this coming weekend.   Electronic Signatures for Addendum Section:  Loistine Simas (MD) (Signed Addendum 01-May-14 12:34)  will hold reglan for now, as starting bentyl   Electronic Signatures: Loistine Simas (MD)  (Signed 01-May-14 12:26)  Authored: Chief Complaint   Last Updated: 01-May-14 12:34 by Loistine Simas (MD)

## 2015-01-17 ENCOUNTER — Encounter: Payer: Self-pay | Admitting: Pain Medicine

## 2015-01-17 ENCOUNTER — Ambulatory Visit: Payer: Medicare Other | Attending: Pain Medicine | Admitting: Pain Medicine

## 2015-01-17 VITALS — BP 110/60 | HR 67 | Temp 98.7°F | Resp 16 | Ht 63.0 in | Wt 162.0 lb

## 2015-01-17 DIAGNOSIS — Z8659 Personal history of other mental and behavioral disorders: Secondary | ICD-10-CM | POA: Insufficient documentation

## 2015-01-17 DIAGNOSIS — G8929 Other chronic pain: Secondary | ICD-10-CM | POA: Insufficient documentation

## 2015-01-17 DIAGNOSIS — E612 Magnesium deficiency: Secondary | ICD-10-CM | POA: Insufficient documentation

## 2015-01-17 DIAGNOSIS — M545 Low back pain: Secondary | ICD-10-CM

## 2015-01-17 DIAGNOSIS — M4726 Other spondylosis with radiculopathy, lumbar region: Secondary | ICD-10-CM

## 2015-01-17 DIAGNOSIS — F17200 Nicotine dependence, unspecified, uncomplicated: Secondary | ICD-10-CM | POA: Insufficient documentation

## 2015-01-17 DIAGNOSIS — E119 Type 2 diabetes mellitus without complications: Secondary | ICD-10-CM | POA: Diagnosis not present

## 2015-01-17 DIAGNOSIS — M5416 Radiculopathy, lumbar region: Secondary | ICD-10-CM

## 2015-01-17 DIAGNOSIS — M4806 Spinal stenosis, lumbar region: Secondary | ICD-10-CM

## 2015-01-17 DIAGNOSIS — M5136 Other intervertebral disc degeneration, lumbar region: Secondary | ICD-10-CM | POA: Diagnosis not present

## 2015-01-17 DIAGNOSIS — F329 Major depressive disorder, single episode, unspecified: Secondary | ICD-10-CM | POA: Insufficient documentation

## 2015-01-17 DIAGNOSIS — Z8489 Family history of other specified conditions: Secondary | ICD-10-CM

## 2015-01-17 DIAGNOSIS — I10 Essential (primary) hypertension: Secondary | ICD-10-CM | POA: Diagnosis not present

## 2015-01-17 DIAGNOSIS — F1910 Other psychoactive substance abuse, uncomplicated: Secondary | ICD-10-CM | POA: Insufficient documentation

## 2015-01-17 DIAGNOSIS — J449 Chronic obstructive pulmonary disease, unspecified: Secondary | ICD-10-CM | POA: Insufficient documentation

## 2015-01-17 DIAGNOSIS — Z720 Tobacco use: Secondary | ICD-10-CM | POA: Insufficient documentation

## 2015-01-17 DIAGNOSIS — G40909 Epilepsy, unspecified, not intractable, without status epilepticus: Secondary | ICD-10-CM | POA: Insufficient documentation

## 2015-01-17 DIAGNOSIS — F411 Generalized anxiety disorder: Secondary | ICD-10-CM | POA: Insufficient documentation

## 2015-01-17 DIAGNOSIS — G894 Chronic pain syndrome: Secondary | ICD-10-CM | POA: Insufficient documentation

## 2015-01-17 DIAGNOSIS — Z87898 Personal history of other specified conditions: Secondary | ICD-10-CM | POA: Insufficient documentation

## 2015-01-17 DIAGNOSIS — F32A Depression, unspecified: Secondary | ICD-10-CM | POA: Insufficient documentation

## 2015-01-17 DIAGNOSIS — M48061 Spinal stenosis, lumbar region without neurogenic claudication: Secondary | ICD-10-CM | POA: Insufficient documentation

## 2015-01-17 DIAGNOSIS — J439 Emphysema, unspecified: Secondary | ICD-10-CM | POA: Insufficient documentation

## 2015-01-17 DIAGNOSIS — M47816 Spondylosis without myelopathy or radiculopathy, lumbar region: Secondary | ICD-10-CM | POA: Insufficient documentation

## 2015-01-17 DIAGNOSIS — Z8709 Personal history of other diseases of the respiratory system: Secondary | ICD-10-CM | POA: Insufficient documentation

## 2015-01-17 HISTORY — DX: Family history of other specified conditions: Z84.89

## 2015-01-17 MED ORDER — SODIUM CHLORIDE 0.9 % IJ SOLN: 2.0000 mL | Freq: Once | INTRAMUSCULAR | Status: DC

## 2015-01-17 MED ORDER — IOHEXOL 180 MG/ML  SOLN
INTRAMUSCULAR | Status: AC
  Administered 2015-01-17: 10:00:00
  Filled 2015-01-17: qty 20

## 2015-01-17 MED ORDER — LIDOCAINE HCL (PF) 1 % IJ SOLN
INTRAMUSCULAR | Status: AC
  Administered 2015-01-17: 10:00:00
  Filled 2015-01-17: qty 5

## 2015-01-17 MED ORDER — LIDOCAINE HCL (PF) 1 % IJ SOLN: 10.0000 mL | Freq: Once | INTRAMUSCULAR | Status: DC

## 2015-01-17 MED ORDER — TRIAMCINOLONE ACETONIDE 40 MG/ML IJ SUSP: 40.0000 mg | Freq: Once | INTRAMUSCULAR | Status: DC

## 2015-01-17 MED ORDER — ROPIVACAINE HCL 2 MG/ML IJ SOLN
INTRAMUSCULAR | Status: AC
  Administered 2015-01-17: 10:00:00
  Filled 2015-01-17: qty 10

## 2015-01-17 MED ORDER — TRIAMCINOLONE ACETONIDE 40 MG/ML IJ SUSP
INTRAMUSCULAR | Status: AC
Start: 2015-01-17 — End: 2015-01-17
  Administered 2015-01-17: 10:00:00
  Filled 2015-01-17: qty 1

## 2015-01-17 MED ORDER — IOHEXOL 180 MG/ML  SOLN: 10.0000 mL | Freq: Once | INTRAMUSCULAR | Status: DC | PRN

## 2015-01-17 MED ORDER — SODIUM CHLORIDE 0.9 % IJ SOLN
INTRAMUSCULAR | Status: AC
  Administered 2015-01-17: 10:00:00
  Filled 2015-01-17: qty 10

## 2015-01-17 MED ORDER — ROPIVACAINE HCL 2 MG/ML IJ SOLN: 2.0000 mL | Freq: Once | INTRAMUSCULAR | Status: DC

## 2015-01-17 NOTE — Progress Notes (Signed)
.  kt

## 2015-01-17 NOTE — Progress Notes (Signed)
Patient's Name: Charlene Williams MRN: [id number] DOB: 09/24/1955 DOS: 01/17/2015  Primary Reason(s) for Visit: Interventional Pain Management Treatment. CC: Back Pain and Leg Pain   Pre-Procedure Assessment: Charlene Williams is a 59 y.o. year old, female patient, seen today for interventional treatment. She has Chronic pain syndrome; Chronic pain; Facet syndrome, lumbar; Chronic low back pain; Chronic radicular lumbar pain, bilateral; DDD (degenerative disc disease), lumbar; Osteoarthritis of spine with radiculopathy, lumbar region; Polysubstance abuse; Lumbar spondylosis; Lumbar facet arthropathy, bilateral; Bilateral lower extremity pain; Magnesium deficiency; Lumbar spinal stenosis; Family history of chronic pain; Nicotine dependence; Smoker; Tobacco abuse; Essential hypertension, benign; Chronic obstructive pulmonary disease (COPD) ([place]); Emphysema of lung ([place]); History of bronchitis; History of shortness of breath; Generalized anxiety disorder; Seizure disorder ([place]); Depression; History of panic attacks; and Non-insulin dependent type 2 diabetes mellitus ([place]) on her problem list.. Her primarily concern today is the Back Pain and Leg Pain Verification of the correct person, correct site (including marking of site), and correct procedure were performed and confirmed by the patient. Today's Vitals   01/17/15 1009 01/17/15 1021 01/17/15 1026 01/17/15 1030  BP: 112/63 117/65 110/61 110/60  Pulse: 66 62 63 67  Temp:      Resp: 18 16 16 16   Height:      Weight:      SpO2: 90% 93% 93% 94%  PainSc:    7   PainLoc:      Calculated BMI: Body mass index is 28.7 kg/(m^2). Allergies: She is allergic to ampicillin and penicillins.. Primary Diagnosis: Osteoarthritis of spine with radiculopathy, lumbar region [M47.26]  Procedure: Type: Palliative Inter-Laminar Epidural Steroid Injection Region: Lumbar Level: L4-5 Level. Laterality: Right-Sided Paramedial  Indications: Spondylosis, Lumbosacral  Region  Consent: Secured. Under the influence of no sedatives a written informed consent was obtained, after having provided information on the risks and possible complications. To fulfill our ethical and legal obligations, as recommended by the American Medical Association's Code of Ethics, we have provided information to the patient about our clinical impression; the nature and purpose of the treatment or procedure; the risks, benefits, and possible complications of the intervention; alternatives; the risk(s) and benefit(s) of the alternative treatment(s) or procedure(s); and the risk(s) and benefit(s) of doing nothing. The patient was provided information about the risks and possible complications associated with the procedure. In the case of spinal procedures these may include, but are not limited to, failure to achieve desired goals, infection, bleeding, organ or nerve damage, allergic reactions, paralysis, and death. In addition, the patient was informed that Medicine is not an exact science; therefore, there is also the possibility of unforeseen risks and possible complications that may result in a catastrophic outcome. The patient indicated having understood very clearly. We have given the patient no guarantees and we have made no promises. Enough time was given to the patient to ask questions, all of which were answered to the patient's satisfaction.  Pre-Procedure Preparation: Safety Precautions: Allergies reviewed. Appropriate site, procedure, and patient were confirmed by following the Joint Commission's Universal Protocol (UP.01.01.01), in the form of a "Time Out". The patient was asked to confirm marked site and procedure, before commencing. The patient was asked about blood thinners, or active infections, both of which were denied. Patient was assessed for positional comfort and all pressure points were checked before starting procedure. Monitoring:  As per clinic protocol. Infection Control  Precautions: Sterile technique used. Standard Universal Precautions were taken as recommended by the Department of [employer] for  Disease Control and Prevention (CDC). Standard pre-surgical skin prep was conducted. Respiratory hygiene and cough etiquette was practiced. Hand hygiene observed. Safe injection practices and needle disposal techniques followed. SDV (single dose vial) medications used. Medications properly checked for expiration dates and contaminants. Personal protective equipment (PPE) used: Surgical mask. Sterile double glove technique. Radiation resistant gloves. Sterile surgical gloves.  Anesthesia, Analgesia, Anxiolysis: Type: Local Anesthesia Local Anesthetic(s): Lidocaine 1% Route: Subcutaneous IV Access: None. Sedation: Declined. Indication(s):Not applicable.  Description of Procedure Process:  Time-out: "Time-out" completed before starting procedure, as per protocol. Position: Prone Target Area: For Epidural Steroid injections, the target area is the  interlaminar space, initially targeting the lower border of the superior vertebral body lamina. Approach: Posterior approach. Area Prepped: Entire Posterior Lumbosacral Region Prepping solution: ChloraPrep (2% chlorhexidine gluconate and 70% isopropyl alcohol) Safety Precautions: Aspiration looking for blood return was conducted prior to all injections. At no point did we inject any substances, as a needle was being advanced. No attempts were made at seeking any paresthesias. Safe injection practices and needle disposal techniques used. Medications properly checked for expiration dates. SDV (single dose vial) medications used. Description of the Procedure: Protocol guidelines were followed. The patient was placed in position over the fluoroscopy table. The target area was identified and the area prepped in the usual manner. Skin desensitized using vapocoolant spray. Skin & deeper tissues infiltrated with local  anesthetic. Appropriate amount of time allowed to pass for local anesthetics to take effect. The procedure needle was introduced through the skin, ipsilateral to the reported pain, and advanced to the target area. Bone was contacted and the needle walked caudad, until the lamina was cleared. The epidural space was identified using "loss-of-resistance technique" with 2-3 ml of PF-NaCl (0.9% NSS), in a 5cc LOR glass syringe. Proper needle placement secured. Negative aspiration confirmed. Solution injected in intermittent fashion, asking for systemic symptoms every 0.5cc of injectate. The needles were then removed and the area cleansed, making sure to leave some of the prepping solution back to take advantage of its long term bactericidal properties. EBL: None Materials & Medications Used:  Needle(s) Used: 20g - 10cm, Tuohy-style epidural needle Medications Administered today: We administered iohexol, lidocaine (PF), sodium chloride, ropivacaine (PF) 2 mg/ml (0.2%), and triamcinolone acetonide.Please see chart orders for dosing details.  Imaging Guidance:  Type of Imaging Technique: Fluoroscopy Guidance (Spinal) Indication(s): Assistance in needle guidance and placement for procedures requiring needle placement in or near specific anatomical locations not easily accessible without such assistance. Exposure Time: Please see nurses notes. Contrast: Before injecting any contrast, we confirmed that the patient did not have an allergy to iodine, shellfish, or radiological contrast. Once satisfactory needle placement was completed at the desired level, radiological contrast was injected. Injection was conducted under continuous fluoroscopic guidance. Injection of contrast accomplished without complications. See chart for type and volume of contrast used. Fluoroscopic Guidance: I was personally present in the fluoroscopy suite, where the patient was placed in position for the procedure, over the  fluoroscopy-compatible table. Fluoroscopy was manipulated, using "Tunnel Vision Technique", to obtain the best possible view of the target area, on the affected side. Parallax error was corrected before commencing the procedure. A "direction-depth-direction" technique was used to introduce the needle under continuous pulsed fluoroscopic guidance. Once the target was reached, antero-posterior, oblique, and lateral fluoroscopic projection views were taken to confirm needle placement in all planes. Permanently recorded images stored by scanning into EMR. Interpretation: Intraoperative imaging interpretation by performing Physician. Adequate needle placement confirmed.  Adequate needle placement confirmed in AP, lateral, & Oblique Views. Appropriate spread of contrast to desired area. No evidence of afferent or efferent intravascular uptake. No intrathecal or subarachnoid spread observed.  Antibiotics:  Type:  Antibiotics Given (last 72 hours)    None      Indication(s): No indications identified.  Post-operative Assessment:  Complications: No immediate post-treatment complications were observed. Relevant Post-operative Information:  Disposition: Return to clinic for follow-up evaluation. The patient tolerated the entire procedure well. A repeat set of vitals were taken after the procedure and the patient was kept under observation following institutional policy, for this procedure. Post-procedural neurological assessment was performed, showing return to baseline, prior to discharge. The patient was discharged home, once institutional criteria were met. The patient was provided with post-procedure discharge instructions, including a section on how to identify potential problems. Should any problems arise concerning this procedure, the patient was given instructions to immediately contact us, at any time, without hesitation. In any case, we plan to contact the patient by telephone for a follow-up status  report regarding this interventional procedure. Comments:  No additional relevant information.  Primary Care Physician: Kasandra Knudsen, NP Location: [facility] Note by: Keltin Baird A. Dossie Arbour, M.D, DABA, DABAPM, DABPM, DABIPP, FIPP  Disclaimer:  Medicine is not an exact science. The only guarantee in medicine is that nothing is guaranteed. It is important to note that the decision to proceed with this intervention was based on the information collected from the patient. The Data and conclusions were drawn from the patient's questionnaire, the interview, and the physical examination. Because the information was provided in large part by the patient, it cannot be guaranteed that it has not been purposely or unconsciously manipulated. Every effort has been made to obtain as much relevant data as possible for this evaluation. It is important to note that the conclusions that lead to this procedure are derived in large part from the available data. Always take into account that the treatment will also be dependent on availability of resources and existing treatment guidelines, considered by other Pain Management Practitioners as being common knowledge and practice, at the time of the intervention. For Medico-Legal purposes, it is also important to point out that variation in procedural techniques and pharmacological choices are the acceptable norm. The indications, contraindications, technique, and results of the above procedure should only be interpreted and judged by a Board-Certified Interventional Pain Specialist with extensive familiarity and expertise in the same exact procedure and technique. Attempts at providing opinions without similar or greater experience and expertise than that of the treating physician will be considered as inappropriate and unethical, and shall result in a formal complaint to the state medical board and applicable specialty societies.

## 2015-01-17 NOTE — Patient Instructions (Addendum)
Smoking Cessation, Tips for Success If you are ready to quit smoking, congratulations! You have chosen to help yourself be healthier. Cigarettes bring nicotine, tar, carbon monoxide, and other irritants into your body. Your lungs, heart, and blood vessels will be able to work better without these poisons. There are many different ways to quit smoking. Nicotine gum, nicotine patches, a nicotine inhaler, or nicotine nasal spray can help with physical craving. Hypnosis, support groups, and medicines help break the habit of smoking. WHAT THINGS CAN I DO TO MAKE QUITTING EASIER?  Here are some tips to help you quit for good: 1. Pick a date when you will quit smoking completely. Tell all of your friends and family about your plan to quit on that date. 2. Do not try to slowly cut down on the number of cigarettes you are smoking. Pick a quit date and quit smoking completely starting on that day. 3. Throw away all cigarettes.  4. Clean and remove all ashtrays from your home, work, and car. 5. On a card, write down your reasons for quitting. Carry the card with you and read it when you get the urge to smoke. 6. Cleanse your body of nicotine. Drink enough water and fluids to keep your urine clear or pale yellow. Do this after quitting to flush the nicotine from your body. 7. Learn to predict your moods. Do not let a bad situation be your excuse to have a cigarette. Some situations in your life might tempt you into wanting a cigarette. 8. Never have "just one" cigarette. It leads to wanting another and another. Remind yourself of your decision to quit. 9. Change habits associated with smoking. If you smoked while driving or when feeling stressed, try other activities to replace smoking. Stand up when drinking your coffee. Brush your teeth after eating. Sit in a different chair when you read the paper. Avoid alcohol while trying to quit, and try to drink fewer caffeinated beverages. Alcohol and caffeine may urge you  to smoke. 10. Avoid foods and drinks that can trigger a desire to smoke, such as sugary or spicy foods and alcohol. 11. Ask people who smoke not to smoke around you. 12. Have something planned to do right after eating or having a cup of coffee. For example, plan to take a walk or exercise. 13. Try a relaxation exercise to calm you down and decrease your stress. Remember, you may be tense and nervous for the first 2 weeks after you quit, but this will pass. 14. Find new activities to keep your hands busy. Play with a pen, coin, or rubber band. Doodle or draw things on paper. 15. Brush your teeth right after eating. This will help cut down on the craving for the taste of tobacco after meals. You can also try mouthwash.  16. Use oral substitutes in place of cigarettes. Try using lemon drops, carrots, cinnamon sticks, or chewing gum. Keep them handy so they are available when you have the urge to smoke. 17. When you have the urge to smoke, try deep breathing. 18. Designate your home as a nonsmoking area. 19. If you are a heavy smoker, ask your health care provider about a prescription for nicotine chewing gum. It can ease your withdrawal from nicotine. 20. Reward yourself. Set aside the cigarette money you save and buy yourself something nice. 21. Look for support from others. Join a support group or smoking cessation program. Ask someone at home or at work to help you with your plan   to quit smoking. 22. Always ask yourself, "Do I need this cigarette or is this just a reflex?" Tell yourself, "Today, I choose not to smoke," or "I do not want to smoke." You are reminding yourself of your decision to quit. 23. Do not replace cigarette smoking with electronic cigarettes (commonly called e-cigarettes). The safety of e-cigarettes is unknown, and some may contain harmful chemicals. 24. If you relapse, do not give up! Plan ahead and think about what you will do the next time you get the urge to smoke. HOW WILL  I FEEL WHEN I QUIT SMOKING? You may have symptoms of withdrawal because your body is used to nicotine (the addictive substance in cigarettes). You may crave cigarettes, be irritable, feel very hungry, cough often, get headaches, or have difficulty concentrating. The withdrawal symptoms are only temporary. They are strongest when you first quit but will go away within 10-14 days. When withdrawal symptoms occur, stay in control. Think about your reasons for quitting. Remind yourself that these are signs that your body is healing and getting used to being without cigarettes. Remember that withdrawal symptoms are easier to treat than the major diseases that smoking can cause.  Even after the withdrawal is over, expect periodic urges to smoke. However, these cravings are generally short lived and will go away whether you smoke or not. Do not smoke! WHAT RESOURCES ARE AVAILABLE TO HELP ME QUIT SMOKING? Your health care provider can direct you to community resources or hospitals for support, which may include: 1. Group support. 2. Education. 3. Hypnosis. 4. Therapy.   This information is not intended to replace advice given to you by your health care provider. Make sure you discuss any questions you have with your health care provider.   Document Released: 12/08/2003 Document Revised: 04/01/2014 Document Reviewed: 08/27/2012 Elsevier Interactive Patient Education 2016 [employer] Pain Management Discharge Instructions  General Discharge Instructions :  If you need to reach your doctor call: Monday-Friday 8:00 am - 4:00 pm at [phone] or toll free [phone].  After clinic hours [phone] to have operator reach doctor.  Bring all of your medication bottles to all your appointments in the pain clinic.  To cancel or reschedule your appointment with Pain Management please remember to call 24 hours in advance to avoid a fee.  Refer to the educational materials which you have been given on:  General Risks, I had my Procedure. Discharge Instructions, Post Sedation.  Post Procedure Instructions:  The drugs you were given will stay in your system until tomorrow, so for the next 24 hours you should not drive, make any legal decisions or drink any alcoholic beverages.  You may eat anything you prefer, but it is better to start with liquids then soups and crackers, and gradually work up to solid foods.  Please notify your doctor immediately if you have any unusual bleeding, trouble breathing or pain that is not related to your normal pain.  Depending on the type of procedure that was done, some parts of your body may feel week and/or numb.  This usually clears up by tonight or the next day.  Walk with the use of an assistive device or accompanied by an adult for the 24 hours.  You may use ice on the affected area for the first 24 hours.  Put ice in a Ziploc bag and cover with a towel and place against area 15 minutes on 15 minutes off.  You may switch to heat after 24 hours.Epidural Steroid Injection  Patient Information  Description: The epidural space surrounds the nerves as they exit the spinal cord.  In some patients, the nerves can be compressed and inflamed by a bulging disc or a tight spinal canal (spinal stenosis).  By injecting steroids into the epidural space, we can bring irritated nerves into direct contact with a potentially helpful medication.  These steroids act directly on the irritated nerves and can reduce swelling and inflammation which often leads to decreased pain.  Epidural steroids may be injected anywhere along the spine and from the neck to the low back depending upon the location of your pain.   After numbing the skin with local anesthetic (like Novocaine), a small needle is passed into the epidural space slowly.  You may experience a sensation of pressure while this is being done.  The entire block usually last less than 10 minutes.  Conditions which may be  treated by epidural steroids:   Low back and leg pain  Neck and arm pain  Spinal stenosis  Post-laminectomy syndrome  Herpes zoster (shingles) pain  Pain from compression fractures  Preparation for the injection:  5. Do not eat any solid food or dairy products within 6 hours of your appointment.  6. You may drink clear liquids up to 2 hours before appointment.  Clear liquids include water, black coffee, juice or soda.  No milk or cream please. 7. You may take your regular medication, including pain medications, with a sip of water before your appointment  Diabetics should hold regular insulin (if taken separately) and take 1/2 normal NPH dos the morning of the procedure.  Carry some sugar containing items with you to your appointment. 8. A driver must accompany you and be prepared to drive you home after your procedure.  9. Bring all your current medications with your. 10. An IV may be inserted and sedation may be given at the discretion of the physician.   11. A blood pressure cuff, EKG and other monitors will often be applied during the procedure.  Some patients may need to have extra oxygen administered for a short period. 12. You will be asked to provide medical information, including your allergies, prior to the procedure.  We must know immediately if you are taking blood thinners (like Coumadin/Warfarin)  Or if you are allergic to IV iodine contrast (dye). We must know if you could possible be pregnant.  Possible side-effects:  Bleeding from needle site  Infection (rare, may require surgery)  Nerve injury (rare)  Numbness & tingling (temporary)  Difficulty urinating (rare, temporary)  Spinal headache ( a headache worse with upright posture)  Light -headedness (temporary)  Pain at injection site (several days)  Decreased blood pressure (temporary)  Weakness in arm/leg (temporary)  Pressure sensation in back/neck (temporary)  Call if you  experience:  Fever/chills associated with headache or increased back/neck pain.  Headache worsened by an upright position.  New onset weakness or numbness of an extremity below the injection site  Hives or difficulty breathing (go to the emergency room)  Inflammation or drainage at the infection site  Severe back/neck pain  Any new symptoms which are concerning to you  Please note:  Although the local anesthetic injected can often make your back or neck feel good for several hours after the injection, the pain will likely return.  It takes 3-7 days for steroids to work in the epidural space.  You may not notice any pain relief for at least that one week.  If effective,  we will often do a series of three injections spaced 3-6 weeks apart to maximally decrease your pain.  After the initial series, we generally will wait several months before considering a repeat injection of the same type.  If you have any questions, please call [phone] [facility]

## 2015-02-02 ENCOUNTER — Emergency Department: Payer: Medicare Other

## 2015-02-02 ENCOUNTER — Emergency Department
Admission: EM | Admit: 2015-02-02 | Discharge: 2015-02-02 | Disposition: A | Discharge status: Home / Self Care | Payer: Medicare Other | Attending: Student | Admitting: Student

## 2015-02-02 DIAGNOSIS — W231XXA Caught, crushed, jammed, or pinched between stationary objects, initial encounter: Secondary | ICD-10-CM | POA: Diagnosis not present

## 2015-02-02 DIAGNOSIS — Z7951 Long term (current) use of inhaled steroids: Secondary | ICD-10-CM | POA: Insufficient documentation

## 2015-02-02 DIAGNOSIS — E119 Type 2 diabetes mellitus without complications: Secondary | ICD-10-CM | POA: Diagnosis not present

## 2015-02-02 DIAGNOSIS — Z7982 Long term (current) use of aspirin: Secondary | ICD-10-CM | POA: Diagnosis not present

## 2015-02-02 DIAGNOSIS — I10 Essential (primary) hypertension: Secondary | ICD-10-CM | POA: Diagnosis not present

## 2015-02-02 DIAGNOSIS — Y9389 Activity, other specified: Secondary | ICD-10-CM | POA: Insufficient documentation

## 2015-02-02 DIAGNOSIS — Z880 Allergy status to penicillin: Secondary | ICD-10-CM | POA: Diagnosis not present

## 2015-02-02 DIAGNOSIS — S60222A Contusion of left hand, initial encounter: Secondary | ICD-10-CM | POA: Insufficient documentation

## 2015-02-02 DIAGNOSIS — Y998 Other external cause status: Secondary | ICD-10-CM | POA: Insufficient documentation

## 2015-02-02 DIAGNOSIS — S6992XA Unspecified injury of left wrist, hand and finger(s), initial encounter: Secondary | ICD-10-CM | POA: Diagnosis present

## 2015-02-02 DIAGNOSIS — Y9289 Other specified places as the place of occurrence of the external cause: Secondary | ICD-10-CM | POA: Insufficient documentation

## 2015-02-02 DIAGNOSIS — Z720 Tobacco use: Secondary | ICD-10-CM | POA: Insufficient documentation

## 2015-02-02 MED ORDER — IBUPROFEN 600 MG PO TABS
600.0000 mg | ORAL_TABLET | Freq: Once | ORAL | Status: AC
  Administered 2015-02-02: 600 mg via ORAL
  Filled 2015-02-02: qty 1

## 2015-02-02 MED ORDER — HYDROCODONE-ACETAMINOPHEN 5-325 MG PO TABS: 1.0000 | ORAL_TABLET | ORAL | Status: DC | PRN

## 2015-02-02 NOTE — ED Provider Notes (Signed)
[facility] Emergency Department Provider Note  ____________________________________________  Time seen: Approximately 12:14 PM  I have reviewed the triage vital signs and the nursing notes.   HISTORY  Chief Complaint Hand Pain   HPI Charlene Williams is a 59 y.o. female is here with complaint of pain to her left hand. Patient states that she was at the "clinic" when her hand was caught in the elevator. 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 Patient complains of throbbing constant pain. Pain is improved with elevation somewhat. Patient has not taken any over-the-counter medication for this. She is also the designated driver. She rates her pain as an 8 out of 10.   Past Medical History  Diagnosis Date  . Diabetes mellitus without complication ([place])   . Sleep apnea   . Hypertension   . Aneurysm ([place])   . Hiatal hernia   . COPD (chronic obstructive pulmonary disease) ([place])   . Illicit drug use   . Migraines   . Hypercholesteremia   . Depression   . Spine disorder   . Family history of chronic pain 01/17/2015    Patient Active Problem List   Diagnosis Date Noted  . Chronic pain syndrome 01/17/2015  . Chronic pain 01/17/2015  . Facet syndrome, lumbar 01/17/2015  . Chronic low back pain 01/17/2015  . Chronic radicular lumbar pain, bilateral 01/17/2015  . DDD (degenerative disc disease), lumbar 01/17/2015  . Osteoarthritis of spine with radiculopathy, lumbar region 01/17/2015  . Polysubstance abuse 01/17/2015  . Lumbar spondylosis 01/17/2015  . Lumbar facet arthropathy, bilateral 01/17/2015  . Bilateral lower extremity pain 01/17/2015  . Magnesium deficiency 01/17/2015  . Lumbar spinal stenosis 01/17/2015  . Family history of chronic pain 01/17/2015  . Nicotine dependence 01/17/2015  . Smoker 01/17/2015  . Tobacco abuse 01/17/2015  . Essential hypertension, benign 01/17/2015  .  Chronic obstructive pulmonary disease (COPD) ([place]) 01/17/2015  . Emphysema of lung ([place]) 01/17/2015  . History of bronchitis 01/17/2015  . History of shortness of breath 01/17/2015  . Generalized anxiety disorder 01/17/2015  . Seizure disorder ([place]) 01/17/2015  . Depression 01/17/2015  . History of panic attacks 01/17/2015  . Non-insulin dependent type 2 diabetes mellitus ([place]) 01/17/2015    Past Surgical History  Procedure Laterality Date  . Abdominal hysterectomy    . Hernia repair    . Eye surgery      Current Outpatient Rx  Name  Route  Sig  Dispense  Refill  . aspirin 81 MG tablet   Oral   Take 81 mg by mouth daily.         . DULoxetine (CYMBALTA) 60 MG capsule   Oral   Take 60 mg by mouth daily.         . enalapril (VASOTEC) 5 MG tablet   Oral   Take 5 mg by mouth daily.         . fluticasone (FLOVENT HFA) 220 MCG/ACT inhaler   Inhalation   Inhale 1 puff into the lungs 2 (two) times daily.         Marland Kitchen gabapentin (NEURONTIN) 800 MG tablet   Oral   Take 800 mg by mouth 3 (three) times daily.         Marland Kitchen HYDROcodone-acetaminophen (NORCO/VICODIN) 5-325 MG tablet   Oral   Take 1 tablet by mouth every 4 (four) hours as needed for moderate pain.   20 tablet  0   . Magnesium 400 MG TABS   Oral   Take 1 tablet by mouth daily.         . metFORMIN (GLUCOPHAGE-XR) 750 MG 24 hr tablet   Oral   Take 750 mg by mouth daily with breakfast.         . omeprazole (PRILOSEC) 20 MG capsule   Oral   Take 20 mg by mouth daily.         . simvastatin (ZOCOR) 40 MG tablet   Oral   Take 40 mg by mouth daily.           Allergies Ampicillin and Penicillins  Family History  Problem Relation Age of [place]  . Heart disease Mother     Social History Social History  Substance Use Topics  . Smoking status: Current Every Day Smoker -- 0.50 packs/day for 45 years    Types: Cigarettes  . Smokeless tobacco: None  . Alcohol Use: No    Review of  Systems Constitutional: No fever/chills Eyes: No visual changes. Cardiovascular: Denies chest pain. Respiratory: Denies shortness of breath. Gastrointestinal:   No nausea, no vomiting.  Genitourinary: Negative for dysuria. Musculoskeletal: Negative for back pain. Positive left hand pain. Skin: Negative for rash. Neurological: Negative for headaches, focal weakness or numbness.  10-point ROS otherwise negative.  ____________________________________________   PHYSICAL EXAM:  VITAL SIGNS: ED Triage Vitals  Enc Vitals Group     BP 02/02/15 1112 156/83 mmHg     Pulse Rate 02/02/15 1112 72     Resp 02/02/15 1112 16     Temp 02/02/15 1112 98.6 F (37 C)     Temp Source 02/02/15 1112 Oral     SpO2 02/02/15 1112 96 %     Weight 02/02/15 1112 162 lb (73.483 kg)     Height 02/02/15 1112 '5\' 3"'$  (1.6 m)     Head Cir --      Peak Flow --      Pain Score 02/02/15 1113 8     Pain Loc --      Pain Edu? --      Excl. in [place]? --     Constitutional: Alert and oriented. Well appearing and in no acute distress. Eyes: Conjunctivae are normal. PERRL. EOMI. Head: Atraumatic. Nose: No congestion/rhinnorhea. Neck: No stridor.   Cardiovascular: Normal rate, regular rhythm. Grossly normal heart sounds.  Good peripheral circulation. Respiratory: Normal respiratory effort.  No retractions. Lungs CTAB. Gastrointestinal: Soft and nontender. No distention.  Musculoskeletal: Left hand markedly tender to palpation. Range of motion of the digits is within normal limits. There is no gross deformity noted on exam. Minimal swelling was noted. Neurologic:  Normal speech and language. No gross focal neurologic deficits are appreciated. No gait instability. Skin:  Skin is warm, dry and intact. No rash noted. No abrasions, erythema or ecchymosis noted Psychiatric: Mood and affect are normal. Speech and behavior are normal.  ____________________________________________   LABS (all labs ordered are listed, but  only abnormal results are displayed)  Labs Reviewed - No data to display   RADIOLOGY  Left hand x-ray per radiologist shows no acute fracture or dislocation. I, [name], personally viewed and evaluated these images (plain radiographs) as part of my medical decision making.  ____________________________________________   PROCEDURES  Procedure(s) performed: None  Critical Care performed: No  ____________________________________________   INITIAL IMPRESSION / ASSESSMENT AND PLAN / ED COURSE  Pertinent labs & imaging results that were available during my  care of the patient were reviewed by me and considered in my medical decision making (see chart for details)  Patient's family was placed in a sling to elevate and she was given a ice bag. 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 She was told to ice and elevate for swelling and to reduce pain. She is to follow-up with her doctor or the orthopedist on call listed on her papers if any continued problems.   ____________________________________________   FINAL CLINICAL IMPRESSION(S) / ED DIAGNOSES  Final diagnoses:  Contusion of left hand, initial encounter      [name], PA-C 02/02/15 1343  Joanne Gavel, MD 02/02/15 1625

## 2015-02-02 NOTE — ED Notes (Signed)
Slammed left hand in elevator today. Throbbing pain. Full ROM

## 2015-02-02 NOTE — ED Notes (Addendum)
Xray complete

## 2015-02-02 NOTE — Discharge Instructions (Signed)
Contusion °A contusion is a deep bruise. Contusions happen when an injury causes bleeding under the skin. Symptoms of bruising include pain, swelling, and discolored skin. The skin may turn blue, purple, or yellow. °HOME CARE  °· Rest the injured area. °· If told, put ice on the injured area. °· Put ice in a plastic bag. °· Place a towel between your skin and the bag. °· Leave the ice on for 20 minutes, 2-3 times per day. °· If told, put light pressure (compression) on the injured area using an elastic bandage. Make sure the bandage is not too tight. Remove it and put it back on as told by your doctor. °· If possible, raise (elevate) the injured area above the level of your heart while you are sitting or lying down. °· Take over-the-counter and prescription medicines only as told by your doctor. °GET HELP IF: °· Your symptoms do not get better after several days of treatment. °· Your symptoms get worse. °· You have trouble moving the injured area. °GET HELP RIGHT AWAY IF:  °· You have very bad pain. °· You have a loss of feeling (numbness) in a hand or foot. °· Your hand or foot turns pale or cold. °  °This information is not intended to replace advice given to you by your health care provider. Make sure you discuss any questions you have with your health care provider. °  °Document Released: 08/28/2007 Document Revised: 11/30/2014 Document Reviewed: 07/27/2014 °Elsevier Interactive Patient Education ©2016 [employer] ° °Cryotherapy °Cryotherapy is when you put ice on your injury. Ice helps lessen pain and puffiness (swelling) after an injury. Ice works the best when you start using it in the first 24 to 48 hours after an injury. °HOME CARE °· Put a dry or damp towel between the ice pack and your skin. °· You may press gently on the ice pack. °· Leave the ice on for no more than 10 to 20 minutes at a time. °· Check your skin after 5 minutes to make sure your skin is okay. °· Rest at least 20 minutes between ice  pack uses. °· Stop using ice when your skin loses feeling (numbness). °· Do not use ice on someone who cannot tell you when it hurts. This includes small children and people with memory problems (dementia). °GET HELP RIGHT AWAY IF: °· You have white spots on your skin. °· Your skin turns blue or pale. °· Your skin feels waxy or hard. °· Your puffiness gets worse. °MAKE SURE YOU:  °· Understand these instructions. °· Will watch your condition. °· Will get help right away if you are not doing well or get worse. °  °This information is not intended to replace advice given to you by your health care provider. Make sure you discuss any questions you have with your health care provider. °  °Document Released: 08/28/2007 Document Revised: 06/03/2011 Document Reviewed: 11/01/2010 °Elsevier Interactive Patient Education ©2016 Elsevier Inc. ° °

## 2015-02-06 ENCOUNTER — Ambulatory Visit: Payer: Medicare Other | Admitting: Pain Medicine

## 2015-03-28 ENCOUNTER — Emergency Department
Admission: EM | Admit: 2015-03-28 | Discharge: 2015-03-28 | Disposition: A | Discharge status: Home / Self Care | Payer: Medicare Other | Attending: Emergency Medicine | Admitting: Emergency Medicine

## 2015-03-28 ENCOUNTER — Encounter: Payer: Self-pay | Admitting: Emergency Medicine

## 2015-03-28 DIAGNOSIS — M5442 Lumbago with sciatica, left side: Secondary | ICD-10-CM | POA: Insufficient documentation

## 2015-03-28 DIAGNOSIS — Z880 Allergy status to penicillin: Secondary | ICD-10-CM | POA: Diagnosis not present

## 2015-03-28 DIAGNOSIS — Z79899 Other long term (current) drug therapy: Secondary | ICD-10-CM | POA: Diagnosis not present

## 2015-03-28 DIAGNOSIS — Z7982 Long term (current) use of aspirin: Secondary | ICD-10-CM | POA: Insufficient documentation

## 2015-03-28 DIAGNOSIS — Z7951 Long term (current) use of inhaled steroids: Secondary | ICD-10-CM | POA: Insufficient documentation

## 2015-03-28 DIAGNOSIS — E119 Type 2 diabetes mellitus without complications: Secondary | ICD-10-CM | POA: Insufficient documentation

## 2015-03-28 DIAGNOSIS — F17210 Nicotine dependence, cigarettes, uncomplicated: Secondary | ICD-10-CM | POA: Diagnosis not present

## 2015-03-28 DIAGNOSIS — I10 Essential (primary) hypertension: Secondary | ICD-10-CM | POA: Insufficient documentation

## 2015-03-28 DIAGNOSIS — Z7984 Long term (current) use of oral hypoglycemic drugs: Secondary | ICD-10-CM | POA: Insufficient documentation

## 2015-03-28 DIAGNOSIS — M5441 Lumbago with sciatica, right side: Secondary | ICD-10-CM | POA: Insufficient documentation

## 2015-03-28 DIAGNOSIS — M545 Low back pain: Secondary | ICD-10-CM | POA: Diagnosis present

## 2015-03-28 DIAGNOSIS — G8929 Other chronic pain: Secondary | ICD-10-CM | POA: Diagnosis not present

## 2015-03-28 DIAGNOSIS — M549 Dorsalgia, unspecified: Secondary | ICD-10-CM

## 2015-03-28 MED ORDER — HYDROMORPHONE HCL 1 MG/ML IJ SOLN
1.0000 mg | Freq: Once | INTRAMUSCULAR | Status: AC
  Administered 2015-03-28: 1 mg via INTRAMUSCULAR
  Filled 2015-03-28: qty 1

## 2015-03-28 MED ORDER — PREDNISONE 10 MG PO TABS: ORAL_TABLET | ORAL | Status: DC

## 2015-03-28 MED ORDER — OXYCODONE HCL 5 MG PO TABS: 5.0000 mg | ORAL_TABLET | Freq: Three times a day (TID) | ORAL | Status: DC | PRN

## 2015-03-28 NOTE — ED Notes (Signed)
State she is having lower back pain and radiates into right leg

## 2015-03-28 NOTE — Discharge Instructions (Signed)
Chronic Pain  Chronic pain can be defined as pain that is off and on and lasts for 3-6 months or longer. Many things cause chronic pain, which can make it difficult to make a diagnosis. There are many treatment options available for chronic pain. However, finding a treatment that works well for you may require trying various approaches until the right one is found. Many people benefit from a combination of two or more types of treatment to control their pain.  SYMPTOMS   Chronic pain can occur anywhere in the body and can range from mild to very severe. Some types of chronic pain include:  · Headache.  · Low back pain.  · Cancer pain.  · Arthritis pain.  · Neurogenic pain. This is pain resulting from damage to nerves.   People with chronic pain may also have other symptoms such as:  · Depression.  · Anger.  · Insomnia.  · Anxiety.  DIAGNOSIS   Your health care provider will help diagnose your condition over time. In many cases, the initial focus will be on excluding possible conditions that could be causing the pain. Depending on your symptoms, your health care provider may order tests to diagnose your condition. Some of these tests may include:   · Blood tests.    · CT scan.    · MRI.    · X-rays.    · Ultrasounds.    · Nerve conduction studies.    You may need to see a specialist.   TREATMENT   Finding treatment that works well may take time. You may be referred to a pain specialist. He or she may prescribe medicine or therapies, such as:   · Mindful meditation or yoga.  · Shots (injections) of numbing or pain-relieving medicines into the spine or area of pain.  · Local electrical stimulation.  · Acupuncture.    · Massage therapy.    · Aroma, color, light, or sound therapy.    · Biofeedback.    · Working with a physical therapist to keep from getting stiff.    · Regular, gentle exercise.    · Cognitive or behavioral therapy.    · Group support.    Sometimes, surgery may be recommended.   HOME CARE INSTRUCTIONS    · Take all medicines as directed by your health care provider.    · Lessen stress in your life by relaxing and doing things such as listening to calming music.    · Exercise or be active as directed by your health care provider.    · Eat a healthy diet and include things such as vegetables, fruits, fish, and lean meats in your diet.    · Keep all follow-up appointments with your health care provider.    · Attend a support group with others suffering from chronic pain.  SEEK MEDICAL CARE IF:   · Your pain gets worse.    · You develop a new pain that was not there before.    · You cannot tolerate medicines given to you by your health care provider.    · You have new symptoms since your last visit with your health care provider.    SEEK IMMEDIATE MEDICAL CARE IF:   · You feel weak.    · You have decreased sensation or numbness.    · You lose control of bowel or bladder function.    · Your pain suddenly gets much worse.    · You develop shaking.  · You develop chills.  · You develop confusion.  · You develop chest pain.  · You develop shortness of breath.    MAKE SURE YOU:  ·   Document Revised: 11/11/2012 Document Reviewed: 09/04/2012 Elsevier Interactive Patient Education 2016 Elsevier Inc.  Radicular Pain Radicular pain in either the arm or leg is usually from a bulging or herniated disk in the spine. A piece of the herniated disk may press against the nerves as the nerves exit the spine. This causes pain which is felt at the tips of the nerves down the arm or leg. Other causes of radicular pain may include:  Fractures.  Heart disease.  Cancer.  An abnormal and usually  degenerative state of the nervous system or nerves (neuropathy). Diagnosis may require CT or MRI scanning to determine the primary cause.  Nerves that start at the neck (nerve roots) may cause radicular pain in the outer shoulder and arm. It can spread down to the thumb and fingers. The symptoms vary depending on which nerve root has been affected. In most cases radicular pain improves with conservative treatment. Neck problems may require physical therapy, a neck collar, or cervical traction. Treatment may take many weeks, and surgery may be considered if the symptoms do not improve.  Conservative treatment is also recommended for sciatica. Sciatica causes pain to radiate from the lower back or buttock area down the leg into the foot. Often there is a history of back problems. Most patients with sciatica are better after 2 to 4 weeks of rest and other supportive care. Short term bed rest can reduce the disk pressure considerably. Sitting, however, is not a good position since this increases the pressure on the disk. You should avoid bending, lifting, and all other activities which make the problem worse. Traction can be used in severe cases. Surgery is usually reserved for patients who do not improve within the first months of treatment. Only take over-the-counter or prescription medicines for pain, discomfort, or fever as directed by your caregiver. Narcotics and muscle relaxants may help by relieving more severe pain and spasm and by providing mild sedation. Cold or massage can give significant relief. Spinal manipulation is not recommended. It can increase the degree of disc protrusion. Epidural steroid injections are often effective treatment for radicular pain. These injections deliver medicine to the spinal nerve in the space between the protective covering of the spinal cord and back bones (vertebrae). Your caregiver can give you more information about steroid injections. These injections are most  effective when given within two weeks of the onset of pain.  You should see your caregiver for follow up care as recommended. A program for neck and back injury rehabilitation with stretching and strengthening exercises is an important part of management.  SEEK IMMEDIATE MEDICAL CARE IF:  You develop increased pain, weakness, or numbness in your arm or leg.  You develop difficulty with bladder or bowel control.  You develop abdominal pain.   This information is not intended to replace advice given to you by your health care provider. Make sure you discuss any questions you have with your health care provider.   Document Released: 04/18/2004 Document Revised: 04/01/2014 Document Reviewed: 10/05/2014 Elsevier Interactive Patient Education [employer].

## 2015-03-28 NOTE — ED Provider Notes (Signed)
CSN: [id number]     Arrival date & time 03/28/15  1851 History   First MD Initiated Contact with Patient 03/28/15 2008     Chief Complaint  Patient presents with  . Back Pain     (Consider location/radiation/quality/duration/timing/severity/associated sxs/prior Treatment) HPI  60 year old female presents to emergency department for evaluation of acute on chronic lower back pain with right greater than left radicular symptoms. Symptoms began 1 day ago, she denies any trauma or injury. She describes pain and burning and numbness ON down the right posterior thigh and calf and the right foot and occasionally down the left posterior thigh. He has had intermittent radicular symptoms since 2012, she has seen pain management, received numerous epidural steroid injections with temporary relief. She is currently taking gabapentin. Her pain over the last 24 hours has been severe 10 out of 10. She denies any weakness loss of bowel or bladder symptoms. She is ambulatory.  Past Medical History  Diagnosis Date  . Diabetes mellitus without complication ([place])   . Sleep apnea   . Hypertension   . Aneurysm ([place])   . Hiatal hernia   . COPD (chronic obstructive pulmonary disease) ([place])   . Illicit drug use   . Migraines   . Hypercholesteremia   . Depression   . Spine disorder   . Family history of chronic pain 01/17/2015   Past Surgical History  Procedure Laterality Date  . Abdominal hysterectomy    . Hernia repair    . Eye surgery     Family History  Problem Relation Age of Onset  . Heart disease Mother    Social History  Substance Use Topics  . Smoking status: Current Every Day Smoker -- 0.50 packs/day for 45 years    Types: Cigarettes  . Smokeless tobacco: None  . Alcohol Use: No   OB History    No data available     Review of Systems  Constitutional: Negative for fever, chills, activity change and fatigue.  HENT: Negative for congestion, sinus pressure and sore throat.   Eyes:  Negative for visual disturbance.  Respiratory: Negative for cough, chest tightness and shortness of breath.   Cardiovascular: Negative for chest pain and leg swelling.  Gastrointestinal: Negative for nausea, vomiting, abdominal pain and diarrhea.  Genitourinary: Negative for dysuria.  Musculoskeletal: Positive for back pain. Negative for arthralgias and gait problem.  Skin: Negative for rash.  Neurological: Positive for numbness (right greater than left legs). Negative for weakness and headaches.  Hematological: Negative for adenopathy.  Psychiatric/Behavioral: Negative for behavioral problems, confusion and agitation.      Allergies  Ampicillin and Penicillins  Home Medications   Prior to Admission medications   Medication Sig Start Date End Date Taking? Authorizing Provider  aspirin 81 MG tablet Take 81 mg by mouth daily.    Historical Provider, MD  DULoxetine (CYMBALTA) 60 MG capsule Take 60 mg by mouth daily.    Historical Provider, MD  enalapril (VASOTEC) 5 MG tablet Take 5 mg by mouth daily.    Historical Provider, MD  fluticasone (FLOVENT HFA) 220 MCG/ACT inhaler Inhale 1 puff into the lungs 2 (two) times daily.    Historical Provider, MD  gabapentin (NEURONTIN) 800 MG tablet Take 800 mg by mouth 3 (three) times daily.    Historical Provider, MD  HYDROcodone-acetaminophen (NORCO/VICODIN) 5-325 MG tablet Take 1 tablet by mouth every 4 (four) hours as needed for moderate pain. 02/02/15   [name], PA-C  Magnesium 400 MG TABS  Take 1 tablet by mouth daily.    Historical Provider, MD  metFORMIN (GLUCOPHAGE-XR) 750 MG 24 hr tablet Take 750 mg by mouth daily with breakfast.    Historical Provider, MD  omeprazole (PRILOSEC) 20 MG capsule Take 20 mg by mouth daily.    Historical Provider, MD  oxyCODONE (ROXICODONE) 5 MG immediate release tablet Take 1 tablet (5 mg total) by mouth every 8 (eight) hours as needed. 03/28/15 03/27/16  Duanne Guess, PA-C  predniSONE (DELTASONE) 10 MG  tablet 10 day taper. 5,5,4,4,3,3,2,2,1,1 03/28/15   Duanne Guess, PA-C  simvastatin (ZOCOR) 40 MG tablet Take 40 mg by mouth daily.    Historical Provider, MD   BP 135/84 mmHg  Pulse 95  Temp(Src) 98.1 F (36.7 C) (Oral)  Resp 20  Ht '5\' 3"'$  (1.6 m)  Wt 73.483 kg  BMI 28.70 kg/m2  SpO2 97% Physical Exam  Constitutional: She is oriented to person, place, and time. She appears well-developed and well-nourished. No distress.  HENT:  Head: Normocephalic and atraumatic.  Mouth/Throat: Oropharynx is clear and moist.  Eyes: EOM are normal. Pupils are equal, round, and reactive to light. Right eye exhibits no discharge. Left eye exhibits no discharge.  Neck: Normal range of motion. Neck supple.  Cardiovascular: Normal rate, regular rhythm and intact distal pulses.   Pulmonary/Chest: Effort normal and breath sounds normal. No respiratory distress. She exhibits no tenderness.  Abdominal: Soft. She exhibits no distension. There is no tenderness.  Musculoskeletal:  Lumbar Spine: Examination of the lumbar spine reveals no bony abnormality, no edema, and no ecchymosis.  There is no step off.  The patient has decreased range of motion of the lumbar spine with flexion and extension.  The patient has normal lateral bend and rotation.  The patient has no mild pain with range of motion activities.  The patient has a negative axial load test, and a negative rotational Waddell test.  The patient is non tender along the spinous process.  The patient is mildly tender along the right paravertebral muscles, with no muscle spasms.  The patient is non tender along the iliac crest.  The patient is non tender in the sciatic notch.  The patient is non tender along the Sacroiliac joint.  There is no Coccyx joint tenderness.    Bilateral Lower Extremities: Examination of the lower extremities reveals no bony abnormality, no edema, and no ecchymosis.  The patient has full active and passive range of motion of the hips,  knees, and ankles.  There is no discomfort with range of motion exercises.  The patient is non tender along the greater trochanter region.  The patient has a negative Bevelyn Buckles' test bilaterally.  There is normal skin warmth.  There is normal capillary refill bilaterally.    Neurologic: The patient has a negative straight leg raise.  The patient has normal muscle strength testing for the quadriceps, calves, ankle dorsiflexion, ankle plantarflexion, and extensor hallicus longus.  The patient has sensation that is intact to light touch.  The deep tendon reflexes are nor   Neurological: She is alert and oriented to person, place, and time. She has normal reflexes.  Skin: Skin is warm and dry.  Psychiatric: She has a normal mood and affect. Her behavior is normal. Thought content normal.    ED Course  Procedures (including critical care time) Labs Review Labs Reviewed - No data to display  Imaging Review No results found. I have personally reviewed and evaluated these images and lab results  as part of my medical decision-making.   EKG Interpretation None      MDM   Final diagnoses:  Chronic back pain  Acute back pain with sciatica, right  Acute back pain with sciatica, left    60 year old female with acute on chronic lower back pain with right greater than left radicular symptoms. No neurological deficits. No loss of bowel or bladder symptoms. She is given a 10 day prednisone taper with oxycodone 5 mg 1 tab by mouth 3 times a day. Pain quantity #15 was 0 refills. She'll follow-up with her back doctor. Return to the ER for any worsening symptoms urgent changes in her health.    Duanne Guess, PA-C 03/28/15 2034  Nena Polio, MD 03/29/15 [phone]

## 2015-04-26 ENCOUNTER — Other Ambulatory Visit: Payer: Self-pay | Admitting: Nurse Practitioner

## 2015-04-26 DIAGNOSIS — Z1231 Encounter for screening mammogram for malignant neoplasm of breast: Secondary | ICD-10-CM

## 2015-05-10 ENCOUNTER — Ambulatory Visit: Payer: Medicare Other | Attending: Nurse Practitioner

## 2015-05-15 ENCOUNTER — Ambulatory Visit: Payer: Medicare Other | Attending: Pain Medicine | Admitting: Pain Medicine

## 2015-05-15 ENCOUNTER — Other Ambulatory Visit: Payer: Self-pay | Admitting: Pain Medicine

## 2015-05-15 ENCOUNTER — Encounter: Payer: Self-pay | Admitting: Pain Medicine

## 2015-05-15 VITALS — BP 123/93 | HR 85 | Temp 98.2°F | Resp 18 | Ht 62.0 in | Wt 160.0 lb

## 2015-05-15 DIAGNOSIS — E559 Vitamin D deficiency, unspecified: Secondary | ICD-10-CM | POA: Insufficient documentation

## 2015-05-15 DIAGNOSIS — G43909 Migraine, unspecified, not intractable, without status migrainosus: Secondary | ICD-10-CM | POA: Insufficient documentation

## 2015-05-15 DIAGNOSIS — E612 Magnesium deficiency: Secondary | ICD-10-CM | POA: Diagnosis not present

## 2015-05-15 DIAGNOSIS — M545 Low back pain, unspecified: Secondary | ICD-10-CM

## 2015-05-15 DIAGNOSIS — M533 Sacrococcygeal disorders, not elsewhere classified: Secondary | ICD-10-CM | POA: Diagnosis not present

## 2015-05-15 DIAGNOSIS — E7800 Pure hypercholesterolemia, unspecified: Secondary | ICD-10-CM | POA: Insufficient documentation

## 2015-05-15 DIAGNOSIS — F329 Major depressive disorder, single episode, unspecified: Secondary | ICD-10-CM | POA: Insufficient documentation

## 2015-05-15 DIAGNOSIS — M4806 Spinal stenosis, lumbar region: Secondary | ICD-10-CM | POA: Insufficient documentation

## 2015-05-15 DIAGNOSIS — F1990 Other psychoactive substance use, unspecified, uncomplicated: Secondary | ICD-10-CM | POA: Diagnosis not present

## 2015-05-15 DIAGNOSIS — F17210 Nicotine dependence, cigarettes, uncomplicated: Secondary | ICD-10-CM | POA: Insufficient documentation

## 2015-05-15 DIAGNOSIS — E119 Type 2 diabetes mellitus without complications: Secondary | ICD-10-CM | POA: Diagnosis not present

## 2015-05-15 DIAGNOSIS — M7918 Myalgia, other site: Secondary | ICD-10-CM | POA: Insufficient documentation

## 2015-05-15 DIAGNOSIS — M62838 Other muscle spasm: Secondary | ICD-10-CM | POA: Diagnosis not present

## 2015-05-15 DIAGNOSIS — J449 Chronic obstructive pulmonary disease, unspecified: Secondary | ICD-10-CM | POA: Diagnosis not present

## 2015-05-15 DIAGNOSIS — Z9889 Other specified postprocedural states: Secondary | ICD-10-CM | POA: Insufficient documentation

## 2015-05-15 DIAGNOSIS — M79606 Pain in leg, unspecified: Secondary | ICD-10-CM

## 2015-05-15 DIAGNOSIS — M791 Myalgia: Secondary | ICD-10-CM

## 2015-05-15 DIAGNOSIS — M792 Neuralgia and neuritis, unspecified: Secondary | ICD-10-CM | POA: Diagnosis not present

## 2015-05-15 DIAGNOSIS — F411 Generalized anxiety disorder: Secondary | ICD-10-CM | POA: Diagnosis not present

## 2015-05-15 DIAGNOSIS — G8929 Other chronic pain: Secondary | ICD-10-CM | POA: Insufficient documentation

## 2015-05-15 DIAGNOSIS — M47816 Spondylosis without myelopathy or radiculopathy, lumbar region: Secondary | ICD-10-CM | POA: Insufficient documentation

## 2015-05-15 DIAGNOSIS — I10 Essential (primary) hypertension: Secondary | ICD-10-CM | POA: Diagnosis not present

## 2015-05-15 DIAGNOSIS — M48061 Spinal stenosis, lumbar region without neurogenic claudication: Secondary | ICD-10-CM

## 2015-05-15 DIAGNOSIS — G4730 Sleep apnea, unspecified: Secondary | ICD-10-CM | POA: Insufficient documentation

## 2015-05-15 DIAGNOSIS — M549 Dorsalgia, unspecified: Secondary | ICD-10-CM | POA: Diagnosis present

## 2015-05-15 MED ORDER — GABAPENTIN 800 MG PO TABS: 800.0000 mg | ORAL_TABLET | Freq: Three times a day (TID) | ORAL | Status: DC

## 2015-05-15 MED ORDER — MAGNESIUM 400 MG PO TABS: 1.0000 | ORAL_TABLET | Freq: Every day | ORAL | Status: DC

## 2015-05-15 NOTE — Patient Instructions (Signed)
Smoking Cessation, Tips for Success If you are ready to quit smoking, congratulations! You have chosen to help yourself be healthier. Cigarettes bring nicotine, tar, carbon monoxide, and other irritants into your body. Your lungs, heart, and blood vessels will be able to work better without these poisons. There are many different ways to quit smoking. Nicotine gum, nicotine patches, a nicotine inhaler, or nicotine nasal spray can help with physical craving. Hypnosis, support groups, and medicines help break the habit of smoking. WHAT THINGS CAN I DO TO MAKE QUITTING EASIER?  Here are some tips to help you quit for good:  Pick a date when you will quit smoking completely. Tell all of your friends and family about your plan to quit on that date.  Do not try to slowly cut down on the number of cigarettes you are smoking. Pick a quit date and quit smoking completely starting on that day.  Throw away all cigarettes.   Clean and remove all ashtrays from your home, work, and car.  On a card, write down your reasons for quitting. Carry the card with you and read it when you get the urge to smoke.  Cleanse your body of nicotine. Drink enough water and fluids to keep your urine clear or pale yellow. Do this after quitting to flush the nicotine from your body.  Learn to predict your moods. Do not let a bad situation be your excuse to have a cigarette. Some situations in your life might tempt you into wanting a cigarette.  Never have "just one" cigarette. It leads to wanting another and another. Remind yourself of your decision to quit.  Change habits associated with smoking. If you smoked while driving or when feeling stressed, try other activities to replace smoking. Stand up when drinking your coffee. Brush your teeth after eating. Sit in a different chair when you read the paper. Avoid alcohol while trying to quit, and try to drink fewer caffeinated beverages. Alcohol and caffeine may urge you to  smoke.  Avoid foods and drinks that can trigger a desire to smoke, such as sugary or spicy foods and alcohol.  Ask people who smoke not to smoke around you.  Have something planned to do right after eating or having a cup of coffee. For example, plan to take a walk or exercise.  Try a relaxation exercise to calm you down and decrease your stress. Remember, you may be tense and nervous for the first 2 weeks after you quit, but this will pass.  Find new activities to keep your hands busy. Play with a pen, coin, or rubber band. Doodle or draw things on paper.  Brush your teeth right after eating. This will help cut down on the craving for the taste of tobacco after meals. You can also try mouthwash.   Use oral substitutes in place of cigarettes. Try using lemon drops, carrots, cinnamon sticks, or chewing gum. Keep them handy so they are available when you have the urge to smoke.  When you have the urge to smoke, try deep breathing.  Designate your home as a nonsmoking area.  If you are a heavy smoker, ask your health care provider about a prescription for nicotine chewing gum. It can ease your withdrawal from nicotine.  Reward yourself. Set aside the cigarette money you save and buy yourself something nice.  Look for support from others. Join a support group or smoking cessation program. Ask someone at home or at work to help you with your plan   to quit smoking.  Always ask yourself, "Do I need this cigarette or is this just a reflex?" Tell yourself, "Today, I choose not to smoke," or "I do not want to smoke." You are reminding yourself of your decision to quit.  Do not replace cigarette smoking with electronic cigarettes (commonly called e-cigarettes). The safety of e-cigarettes is unknown, and some may contain harmful chemicals.  If you relapse, do not give up! Plan ahead and think about what you will do the next time you get the urge to smoke. HOW WILL I FEEL WHEN I QUIT SMOKING? You  may have symptoms of withdrawal because your body is used to nicotine (the addictive substance in cigarettes). You may crave cigarettes, be irritable, feel very hungry, cough often, get headaches, or have difficulty concentrating. The withdrawal symptoms are only temporary. They are strongest when you first quit but will go away within 10-14 days. When withdrawal symptoms occur, stay in control. Think about your reasons for quitting. Remind yourself that these are signs that your body is healing and getting used to being without cigarettes. Remember that withdrawal symptoms are easier to treat than the major diseases that smoking can cause.  Even after the withdrawal is over, expect periodic urges to smoke. However, these cravings are generally short lived and will go away whether you smoke or not. Do not smoke! WHAT RESOURCES ARE AVAILABLE TO HELP ME QUIT SMOKING? Your health care provider can direct you to community resources or hospitals for support, which may include:  Group support.  Education.  Hypnosis.  Therapy.   This information is not intended to replace advice given to you by your health care provider. Make sure you discuss any questions you have with your health care provider.   Document Released: 12/08/2003 Document Revised: 04/01/2014 Document Reviewed: 08/27/2012 Elsevier Interactive Patient Education 2016 [employer] Inc[employer]  

## 2015-05-15 NOTE — Progress Notes (Signed)
Patient's Name: Charlene Williams MRN: [id number] DOB: [birth year] DOS: 05/15/2015  Primary Reason(s) for Visit: Post-Procedure evaluation CC: Back Pain   HPI  Charlene Williams is a 60 y.o. year old, female patient, who returns today as an established patient. She has Chronic pain syndrome; Chronic pain; Lumbar facet syndrome (Location of Primary Source of Pain) (Bilateral) (R>L); Chronic low back pain (Location of Primary Source of Pain) (Bilateral) (R>L); Chronic radicular lumbar pain (Bilateral); Osteoarthritis of spine with radiculopathy, lumbar region; Polysubstance abuse; Lumbar spondylosis; Lumbar facet arthropathy (Bilateral); Magnesium deficiency; Lumbar spinal stenosis (Central Spinal Stenosis) (Severe) (L3-4 and L4-5); Family history of chronic pain; Nicotine dependence; Smoker; Tobacco abuse; Essential hypertension, benign; Chronic obstructive pulmonary disease (COPD) ([place]); Emphysema of lung ([place]); History of bronchitis; History of shortness of breath; Generalized anxiety disorder; Seizure disorder ([place]); Depression; History of panic attacks; Non-insulin dependent type 2 diabetes mellitus ([place]); Myofascial pain; Muscle spasms of lower extremity; Chronic sacroiliac joint pain (Location of Primary Source of Pain) (Bilateral) (R>L); Neurogenic pain; Avitaminosis D; Chronic lower extremity pain (Bilateral); and Substance use disorder (SUB Risk: Very high) on her problem list.. Her primarily concern today is the Back Pain   The patient returns to the clinics today after having had a palliative interlaminar epidural steroid injection at the L4-5 level on the right side done on 01/17/2015.  Reported Pain Score: 9 , clinically she looks like a 2-3/10. Reported level is inconsistent with clinical obrservations. Pain Type: Chronic pain Pain Location: Back Pain Orientation: Lower Pain Frequency: Constant  Date of Last Visit: 01/17/15 Service Provided on Last Visit: Procedure (LESI)  Controlled  Substance Pharmacotherapy Assessment  We are currently not providing any controlled substances to this patient due to her history of polysubstance abuse and substance use disorder.  Inflammation Markers Lab Results  Component Value Date   ESRSEDRATE 11 02/15/2013    Renal Function Lab Results  Component Value Date   BUN 20* 02/15/2013   CREATININE 0.96 02/15/2013   GFRAA >60 02/15/2013   GFRNONAA >60 02/15/2013    Hepatic Function Lab Results  Component Value Date   AST 20 07/18/2012   ALT 20 07/18/2012   ALBUMIN 4.0 07/18/2012    Electrolytes Lab Results  Component Value Date   NA 141 02/15/2013   K 4.1 02/15/2013   CL 115* 02/15/2013   CALCIUM 9.0 02/15/2013   MG 1.6* 02/15/2013    Post-Procedure Assessment  Procedure done on last visit: Palliative, right-sided, L4-5 interlaminar epidural steroid injection under fluoroscopic guidance, no sedation. Side-effects or Adverse reactions: None reported Sedation: No sedation used during procedure  Results: Ultra-Short Term Relief (First 1 hour after procedure): 0 %  The patient indicates not having any benefit from the local anesthetics injected into the area or the IV sedation provided. This is rather rare and could suggest that the patient does not understand what is being asked, or is attempting to convey the notion that nothing is providing permanent relief. In both instances, further patient education is needed Short Term Relief (Initial 4-6 hrs after procedure): 0 % No short-term benefit could suggest the source of pain to be elsewhere Long Term Relief : 0 % No long-term benefit would suggest etiology to be non-inflammatory, possibly compressive   Current Relief (Now):  0%  No persistent benefit would suggest no long-term anti-inflammatory benefit from intervention and/or persistent or recurrent aggravating factors Interpretation of Results: Typically this procedure provides patient with excellent relief of the pain.  Either there has been a  change in her pathology or she simply does not recall the results of the therapy since she did not keep her follow-up appointment.  Allergies  Charlene Williams is allergic to ampicillin and penicillins.  Meds  The patient has a current medication list which includes the following prescription(s): aspirin, duloxetine, enalapril, fluticasone, gabapentin, magnesium, metformin, omeprazole, and simvastatin.  Current Outpatient Prescriptions on File Prior to Visit  Medication Sig  . aspirin 81 MG tablet Take 81 mg by mouth daily.  . DULoxetine (CYMBALTA) 60 MG capsule Take 60 mg by mouth daily.  . enalapril (VASOTEC) 5 MG tablet Take 5 mg by mouth daily.  . fluticasone (FLOVENT HFA) 220 MCG/ACT inhaler Inhale 1 puff into the lungs 2 (two) times daily.  . metFORMIN (GLUCOPHAGE-XR) 750 MG 24 hr tablet Take 750 mg by mouth daily with breakfast.  . omeprazole (PRILOSEC) 20 MG capsule Take 20 mg by mouth daily.  . simvastatin (ZOCOR) 40 MG tablet Take 40 mg by mouth daily.   No current facility-administered medications on file prior to visit.    ROS  Constitutional: Afebrile, no chills, well hydrated and well nourished Gastrointestinal: negative Musculoskeletal:negative Neurological: negative Behavioral/Psych: negative  PFSH  Medical:  Charlene Williams  has a past medical history of Diabetes mellitus without complication ([place]); Sleep apnea; Hypertension; Aneurysm ([place]); Hiatal hernia; COPD (chronic obstructive pulmonary disease) ([place]); Illicit drug use; Migraines; Hypercholesteremia; Depression; Spine disorder; and Family history of chronic pain (01/17/2015). Family: family history includes Heart disease in her mother. Surgical:  has past surgical history that includes Abdominal hysterectomy; Hernia repair; and Eye surgery. Tobacco:  reports that she has been smoking Cigarettes.  She has a 22.5 pack-year smoking history. She does not have any smokeless tobacco history on  file. Alcohol:  reports that she does not drink alcohol. Drug:  reports that she uses illicit drugs.  Physical Exam  Vitals:  Today's Vitals   05/15/15 1100 05/15/15 1102  BP:  123/93  Pulse: 85   Temp: 98.2 F (36.8 C)   Resp: 18   Height: '5\' 2"'$  (1.575 m)   Weight: 160 lb (72.576 kg)   SpO2: 100%   PainSc: 9  9   PainLoc: Back     Calculated BMI: Body mass index is 29.26 kg/(m^2).  General appearance: cooperative, appears stated age, distracted, mild distress, moderately obese and slowed mentation Eyes: PERLA Respiratory: No evidence respiratory distress, no audible rales or ronchi and no use of accessory muscles of respiration   Lumbar Spine Inspection: No gross anomalies detected Alignment: Symetrical ROM: Decreased  Gait: Antalgic (limping)  Lower Extremities Inspection: No gross anomalies detected ROM: Decreased Sensory:  Normal Motor: Grossly intact  Assessment & Plan  Primary Diagnosis & Pertinent Problem List: The primary encounter diagnosis was Chronic sacroiliac joint pain (Location of Primary Source of Pain) (Bilateral) (R>L). Diagnoses of Myofascial pain, Muscle spasm of right lower extremity, Neurogenic pain, Chronic pain, Chronic low back pain (Location of Primary Source of Pain) (Bilateral) (R>L), Avitaminosis D, Chronic pain of lower extremity, unspecified laterality, Lumbar spinal stenosis (Central Spinal Stenosis) (Severe) (L3-4 and L4-5), and Substance use disorder (SUB Risk: Very high) were also pertinent to this visit.  Visit Diagnosis: 1. Chronic sacroiliac joint pain (Location of Primary Source of Pain) (Bilateral) (R>L)   2. Myofascial pain   3. Muscle spasm of right lower extremity   4. Neurogenic pain   5. Chronic pain   6. Chronic low back pain (Location of Primary Source of Pain) (Bilateral) (R>L)  7. Avitaminosis D   8. Chronic pain of lower extremity, unspecified laterality   9. Lumbar spinal stenosis (Central Spinal Stenosis)  (Severe) (L3-4 and L4-5)   10. Substance use disorder (SUB Risk: Very high)     Problem-specific Plan(s): No problem-specific assessment & plan notes found for this encounter.   Plan of Care  Pharmacotherapy (Medications Ordered): Meds ordered this encounter  Medications  . gabapentin (NEURONTIN) 800 MG tablet    Sig: Take 1 tablet (800 mg total) by mouth 3 (three) times daily.    Dispense:  120 tablet    Refill:  0    Do not place this medication, or any other prescription from our practice, on "Automatic Refill". Patient may have prescription filled one day early if pharmacy is closed on scheduled refill date.  . Magnesium 400 MG TABS    Sig: Take 1 tablet by mouth daily.    Dispense:  30 tablet    Refill:  PRN    Do not place this medication, or any other prescription from our practice, on "Automatic Refill". Patient may have prescription filled one day early if pharmacy is closed on scheduled refill date.    Lab-work & Procedure Ordered: Orders Placed This Encounter  Procedures  . SACROILIAC JOINT INJECTINS    Standing Status: Future     Number of Occurrences:      Standing Expiration Date: 05/14/2016    Scheduling Instructions:     Side: Bilateral     Sedation: With Sedation.     Timeframe: ASAP    Order Specific Question:  Where will this procedure be performed?    Answer:  [facility]  . Vitamin B12    Indication: Bone Pain (M89.9)  . Vitamin D pnl(25-hydrxy+1,25-dihy)-bld    Imaging Ordered: None  Interventional Therapies: Scheduled: Diagnostic, bilateral, sacroiliac joint block, under fluoroscopic guidance and IV sedation. PRN Procedures: Possible radiofrequency ablation.    Referral(s) or Consult(s): None at this time.  Medications administered during this visit: Ms. Worthey had no medications administered during this visit.  Future Appointments Date Time Provider [place]  05/25/2015 9:00 AM Milinda Pointer, MD [facility] None     Primary Care Physician: Kasandra Knudsen, NP Location: [facility] Note by: Kathlen Brunswick. Dossie Arbour, M.D, DABA, DABAPM, DABPM, DABIPP, FIPP

## 2015-05-15 NOTE — Progress Notes (Signed)
Safety precautions to be maintained throughout the outpatient stay will include: orient to surroundings, keep bed in low position, maintain call bell within reach at all times, provide assistance with transfer out of bed and ambulation.  

## 2015-05-19 ENCOUNTER — Ambulatory Visit
Admission: RE | Admit: 2015-05-19 | Discharge: 2015-05-19 | Disposition: A | Discharge status: Home / Self Care | Payer: Medicare Other | Source: Ambulatory Visit | Attending: Nurse Practitioner | Admitting: Nurse Practitioner

## 2015-05-19 ENCOUNTER — Other Ambulatory Visit: Payer: Self-pay | Admitting: Nurse Practitioner

## 2015-05-19 DIAGNOSIS — M79606 Pain in leg, unspecified: Secondary | ICD-10-CM

## 2015-05-19 DIAGNOSIS — M79601 Pain in right arm: Secondary | ICD-10-CM

## 2015-05-19 DIAGNOSIS — G8929 Other chronic pain: Secondary | ICD-10-CM | POA: Insufficient documentation

## 2015-05-19 DIAGNOSIS — F1990 Other psychoactive substance use, unspecified, uncomplicated: Secondary | ICD-10-CM | POA: Insufficient documentation

## 2015-05-21 LAB — TOXASSURE SELECT 13 (MW), URINE: PDF: 0

## 2015-05-23 NOTE — Progress Notes (Signed)
Quick Note:  Positive, undisclosed use of illicit substance (Cannabinoids). Our program has a "Zero Tolerance" for the use of illicit substances. No controlled substances will be prescribed. ______

## 2015-05-25 ENCOUNTER — Ambulatory Visit: Payer: Medicare Other | Attending: Pain Medicine | Admitting: Pain Medicine

## 2015-05-25 ENCOUNTER — Encounter: Payer: Self-pay | Admitting: Pain Medicine

## 2015-05-25 VITALS — BP 136/76 | HR 76 | Temp 98.5°F | Resp 16 | Ht 63.0 in | Wt 162.0 lb

## 2015-05-25 DIAGNOSIS — M549 Dorsalgia, unspecified: Secondary | ICD-10-CM | POA: Diagnosis present

## 2015-05-25 DIAGNOSIS — M545 Low back pain, unspecified: Secondary | ICD-10-CM

## 2015-05-25 DIAGNOSIS — I10 Essential (primary) hypertension: Secondary | ICD-10-CM | POA: Diagnosis not present

## 2015-05-25 DIAGNOSIS — G40909 Epilepsy, unspecified, not intractable, without status epilepticus: Secondary | ICD-10-CM | POA: Insufficient documentation

## 2015-05-25 DIAGNOSIS — M47896 Other spondylosis, lumbar region: Secondary | ICD-10-CM | POA: Diagnosis not present

## 2015-05-25 DIAGNOSIS — E559 Vitamin D deficiency, unspecified: Secondary | ICD-10-CM | POA: Diagnosis not present

## 2015-05-25 DIAGNOSIS — M4806 Spinal stenosis, lumbar region: Secondary | ICD-10-CM | POA: Insufficient documentation

## 2015-05-25 DIAGNOSIS — M791 Myalgia: Secondary | ICD-10-CM | POA: Diagnosis not present

## 2015-05-25 DIAGNOSIS — M79606 Pain in leg, unspecified: Secondary | ICD-10-CM

## 2015-05-25 DIAGNOSIS — M48061 Spinal stenosis, lumbar region without neurogenic claudication: Secondary | ICD-10-CM

## 2015-05-25 DIAGNOSIS — E119 Type 2 diabetes mellitus without complications: Secondary | ICD-10-CM | POA: Insufficient documentation

## 2015-05-25 DIAGNOSIS — E612 Magnesium deficiency: Secondary | ICD-10-CM | POA: Insufficient documentation

## 2015-05-25 DIAGNOSIS — M533 Sacrococcygeal disorders, not elsewhere classified: Secondary | ICD-10-CM | POA: Diagnosis not present

## 2015-05-25 DIAGNOSIS — F17200 Nicotine dependence, unspecified, uncomplicated: Secondary | ICD-10-CM | POA: Diagnosis not present

## 2015-05-25 DIAGNOSIS — M47816 Spondylosis without myelopathy or radiculopathy, lumbar region: Secondary | ICD-10-CM | POA: Diagnosis not present

## 2015-05-25 DIAGNOSIS — J449 Chronic obstructive pulmonary disease, unspecified: Secondary | ICD-10-CM | POA: Insufficient documentation

## 2015-05-25 DIAGNOSIS — G8929 Other chronic pain: Secondary | ICD-10-CM | POA: Insufficient documentation

## 2015-05-25 MED ORDER — LACTATED RINGERS IV SOLN: 1000.0000 mL | INTRAVENOUS | Status: AC

## 2015-05-25 MED ORDER — ROPIVACAINE HCL 2 MG/ML IJ SOLN: 5.0000 mL | Freq: Once | INTRAMUSCULAR | Status: DC

## 2015-05-25 MED ORDER — METHYLPREDNISOLONE ACETATE 80 MG/ML IJ SUSP
INTRAMUSCULAR | Status: AC
  Administered 2015-05-25: 10:00:00
  Filled 2015-05-25: qty 1

## 2015-05-25 MED ORDER — MIDAZOLAM HCL 5 MG/5ML IJ SOLN: 5.0000 mg | INTRAMUSCULAR | Status: DC

## 2015-05-25 MED ORDER — METHYLPREDNISOLONE ACETATE 80 MG/ML IJ SUSP: 80.0000 mg | Freq: Once | INTRAMUSCULAR | Status: DC

## 2015-05-25 MED ORDER — ROPIVACAINE HCL 2 MG/ML IJ SOLN
INTRAMUSCULAR | Status: AC
  Administered 2015-05-25: 10:00:00
  Filled 2015-05-25: qty 10

## 2015-05-25 MED ORDER — FENTANYL CITRATE (PF) 100 MCG/2ML IJ SOLN: 100.0000 ug | INTRAMUSCULAR | Status: DC

## 2015-05-25 MED ORDER — LIDOCAINE HCL (PF) 1 % IJ SOLN: 10.0000 mL | Freq: Once | INTRAMUSCULAR | Status: DC

## 2015-05-25 MED ORDER — MIDAZOLAM HCL 5 MG/5ML IJ SOLN
INTRAMUSCULAR | Status: AC
  Administered 2015-05-25: 2 mg via INTRAVENOUS
  Filled 2015-05-25: qty 5

## 2015-05-25 MED ORDER — FENTANYL CITRATE (PF) 100 MCG/2ML IJ SOLN
INTRAMUSCULAR | Status: AC
  Administered 2015-05-25: 50 ug via INTRAVENOUS
  Filled 2015-05-25: qty 2

## 2015-05-25 NOTE — Patient Instructions (Addendum)
Safety precautions to be maintained throughout the outpatient stay will include: orient to surroundings, keep bed in low position, maintain call bell within reach at all times, provide assistance with transfer out of bed and ambulation.Sacroiliac (SI) Joint Injection Patient Information  Description: The sacroiliac joint connects the scrum (very low back and tailbone) to the ilium (a pelvic bone which also forms half of the hip joint).  Normally this joint experiences very little motion.  When this joint becomes inflamed or unstable low back and or hip and pelvis pain may result.  Injection of this joint with local anesthetics (numbing medicines) and steroids can provide diagnostic information and reduce pain.  This injection is performed with the aid of x-ray guidance into the tailbone area while you are lying on your stomach.   You may experience an electrical sensation down the leg while this is being done.  You may also experience numbness.  We also may ask if we are reproducing your normal pain during the injection.  Conditions which may be treated SI injection:   Low back, buttock, hip or leg pain  Preparation for the Injection:  1. Do not eat any solid food or dairy products within 6 hours of your appointment.  2. You may drink clear liquids up to 2 hours before appointment.  Clear liquids include water, black coffee, juice or soda.  No milk or cream please. 3. You may take your regular medications, including pain medications with a sip of water before your appointment.  Diabetics should hold regular insulin (if take separately) and take 1/2 normal NPH dose the morning of the procedure.  Carry some sugar containing items with you to your appointment. 4. A driver must accompany you and be prepared to drive you home after your procedure. 5. Bring all of your current medications with you. 6. An IV may be inserted and sedation may be given at the discretion of the physician. 7. A blood pressure  cuff, EKG and other monitors will often be applied during the procedure.  Some patients may need to have extra oxygen administered for a short period.  8. You will be asked to provide medical information, including your allergies, prior to the procedure.  We must know immediately if you are taking blood thinners (like Coumadin/Warfarin) or if you are allergic to IV iodine contrast (dye).  We must know if you could possible be pregnant.  Possible side effects:   Bleeding from needle site  Infection (rare, may require surgery)  Nerve injury (rare)  Numbness & tingling (temporary)  A brief convulsion or seizure  Light-headedness (temporary)  Pain at injection site (several days)  Decreased blood pressure (temporary)  Weakness in the leg (temporary)   Call if you experience:   New onset weakness or numbness of an extremity below the injection site that last more than 8 hours.  Hives or difficulty breathing ( go to the emergency room)  Inflammation or drainage at the injection site  Any new symptoms which are concerning to you  Please note:  Although the local anesthetic injected can often make your back/ hip/ buttock/ leg feel good for several hours after the injections, the pain will likely return.  It takes 3-7 days for steroids to work in the sacroiliac area.  You may not notice any pain relief for at least that one week.  If effective, we will often do a series of three injections spaced 3-6 weeks apart to maximally decrease your pain.  After the initial  series, we generally will wait some months before a repeat injection of the same type.  If you have any questions, please call [phone] [facility]  Pain Management Discharge Instructions  General Discharge Instructions :  If you need to reach your doctor call: Monday-Friday 8:00 am - 4:00 pm at [phone] or toll free [phone].  After clinic hours [phone] to have  operator reach doctor.  Bring all of your medication bottles to all your appointments in the pain clinic.  To cancel or reschedule your appointment with Pain Management please remember to call 24 hours in advance to avoid a fee.  Refer to the educational materials which you have been given on: General Risks, I had my Procedure. Discharge Instructions, Post Sedation.  Post Procedure Instructions:  The drugs you were given will stay in your system until tomorrow, so for the next 24 hours you should not drive, make any legal decisions or drink any alcoholic beverages.  You may eat anything you prefer, but it is better to start with liquids then soups and crackers, and gradually work up to solid foods.  Please notify your doctor immediately if you have any unusual bleeding, trouble breathing or pain that is not related to your normal pain.  Depending on the type of procedure that was done, some parts of your body may feel week and/or numb.  This usually clears up by tonight or the next day.  Walk with the use of an assistive device or accompanied by an adult for the 24 hours.  You may use ice on the affected area for the first 24 hours.  Put ice in a Ziploc bag and cover with a towel and place against area 15 minutes on 15 minutes off.  You may switch to heat after 24 hours.

## 2015-05-25 NOTE — Progress Notes (Signed)
Safety precautions to be maintained throughout the outpatient stay will include: orient to surroundings, keep bed in low position, maintain call bell within reach at all times, provide assistance with transfer out of bed and ambulation.  

## 2015-05-25 NOTE — Progress Notes (Signed)
Patient's Name: Charlene Williams MRN: [id number] DOB: 01/30/1956 DOS: 05/25/2015  Primary Reason(s) for Visit: Interventional Pain Management Treatment. CC: Back Pain   Pre-Procedure Assessment  Charlene Williams is a 60 y.o. year old, female patient, seen today for interventional treatment. She has Chronic pain syndrome; Chronic pain; Lumbar facet syndrome (Location of Primary Source of Pain) (Bilateral) (R>L); Chronic low back pain (Location of Primary Source of Pain) (Bilateral) (R>L); Chronic radicular lumbar pain (Bilateral); Osteoarthritis of spine with radiculopathy, lumbar region; Polysubstance abuse; Lumbar spondylosis; Lumbar facet arthropathy (Bilateral); Magnesium deficiency; Lumbar spinal stenosis (Central Spinal Stenosis) (Severe) (L3-4 and L4-5); Family history of chronic pain; Nicotine dependence; Smoker; Tobacco abuse; Essential hypertension, benign; Chronic obstructive pulmonary disease (COPD) ([place]); Emphysema of lung ([place]); History of bronchitis; History of shortness of breath; Generalized anxiety disorder; Seizure disorder ([place]); Depression; History of panic attacks; Non-insulin dependent type 2 diabetes mellitus ([place]); Myofascial pain; Muscle spasms of lower extremity; Chronic sacroiliac joint pain (Location of Primary Source of Pain) (Bilateral) (R>L); Neurogenic pain; Avitaminosis D; Chronic lower extremity pain (Bilateral); and Substance use disorder (SUB Risk: Very high) on her problem list.. Her primarily concern today is the Back Pain   Today's Initial Pain Score: 8/10 Reported level of pain is incompatible with clinical obrservations. This may be secondary to a possible lack of understanding on how the pain scale works. Pain Type: Chronic pain Pain Location: Back Pain Orientation: Lower, Right Pain Descriptors / Indicators: Aching, Radiating, Sharp Pain Frequency: Constant  Post-procedure Pain Score: 0-No pain  Date of Last Visit: 05/15/15 Service Provided on Last Visit:  Evaluation  Verification of the correct person, correct site (including marking of site), and correct procedure were performed and confirmed by the patient.  Today's Vitals   05/25/15 1010 05/25/15 1020 05/25/15 1030 05/25/15 1040  BP: 143/69 137/81 144/73 136/76  Pulse: 62 69 72 76  Temp:      Resp: 16 18 16 16   Height:      Weight:      SpO2: 95% 96% 97% 98%  PainSc: Asleep Asleep 0-No pain 0-No pain  PainLoc:      Calculated BMI: Body mass index is 28.7 kg/(m^2). Allergies: She is allergic to ampicillin and penicillins.. Primary Diagnosis: Chronic low back pain [M54.5, G89.29]  Procedure  Type: Diagnostic Sacroiliac Joint Steroid Injection Region: Superior Lumbosacral Region Level: PSIS (Posterior Superior Iliac Spine) Laterality: Bilateral  Indications: 1. Chronic low back pain (Location of Primary Source of Pain) (Bilateral) (R>L)   2. Chronic sacroiliac joint pain (Location of Primary Source of Pain) (Bilateral) (R>L)   3. Chronic pain of lower extremity, unspecified laterality   4. Lumbar spinal stenosis (Central Spinal Stenosis) (Severe) (L3-4 and L4-5)     In addition, Charlene Williams has Chronic pain syndrome; Chronic pain; Lumbar facet syndrome (Location of Primary Source of Pain) (Bilateral) (R>L); Chronic low back pain (Location of Primary Source of Pain) (Bilateral) (R>L); Chronic radicular lumbar pain (Bilateral); Osteoarthritis of spine with radiculopathy, lumbar region; Lumbar spondylosis; Lumbar facet arthropathy (Bilateral); Lumbar spinal stenosis (Central Spinal Stenosis) (Severe) (L3-4 and L4-5); Myofascial pain; Muscle spasms of lower extremity; Chronic sacroiliac joint pain (Location of Primary Source of Pain) (Bilateral) (R>L); Neurogenic pain; Avitaminosis D; and Chronic lower extremity pain (Bilateral) on her pertinent problem list.  Consent: Secured. Under the influence of no sedatives a written informed consent was obtained, after having provided information  on the risks and possible complications. To fulfill our ethical and legal obligations, as recommended by the  American Medical Association's Code of Ethics, we have provided information to the patient about our clinical impression; the nature and purpose of the treatment or procedure; the risks, benefits, and possible complications of the intervention; alternatives; the risk(s) and benefit(s) of the alternative treatment(s) or procedure(s); and the risk(s) and benefit(s) of doing nothing. The patient was provided information about the risks and possible complications associated with the procedure. These include, but are not limited to, failure to achieve desired goals, infection, bleeding, organ or nerve damage, allergic reactions, paralysis, and death. In the case of intra- or periarticular procedures these may include, but are not limited to, failure to achieve desired goals, infection, bleeding (hemarthrosis), organ or nerve damage, allergic reactions, and death. In addition, the patient was informed that Medicine is not an exact science; therefore, there is also the possibility of unforeseen risks and possible complications that may result in a catastrophic outcome. The patient indicated having understood very clearly. We have given the patient no guarantees and we have made no promises. Enough time was given to the patient to ask questions, all of which were answered to the patient's satisfaction.  Pre-Procedure Preparation: Safety Precautions: Allergies reviewed. Appropriate site, procedure, and patient were confirmed by following the Joint Commission's Universal Protocol (UP.01.01.01), in the form of a "Time Out". The patient was asked to confirm marked site and procedure, before commencing. The patient was asked about blood thinners, or active infections, both of which were denied. Patient was assessed for positional comfort and all pressure points were checked before starting procedure. Monitoring:  As  per clinic protocol. Infection Control Precautions: Sterile technique used. Standard Universal Precautions were taken as recommended by the Department of [facility] for Disease Control and Prevention (CDC). Standard pre-surgical skin prep was conducted. Respiratory hygiene and cough etiquette was practiced. Hand hygiene observed. Safe injection practices and needle disposal techniques followed. SDV (single dose vial) medications used. Medications properly checked for expiration dates and contaminants. Personal protective equipment (PPE) used: Sterile Radiation-resistant gloves.  Anesthesia, Analgesia, Anxiolysis  Type: Moderate (Conscious) Sedation & Local Anesthesia Local Anesthetic: Lidocaine 1% Route: Intravenous (IV) IV Access: Secured Sedation: Meaningful verbal contact was maintained at all times during the procedure  Indication(s): Analgesia & Anxiolysis  Description of Procedure Process:  Time-out: "Time-out" completed before starting procedure, as per protocol. Position: Prone Target Area: Superior, posterior, aspect of the sacroiliac fissure Approach: Posterior, paraspinal, ipsilateral approach. Area Prepped: Entire Lower Lumbosacral Region Prepping solution: ChloraPrep (2% chlorhexidine gluconate and 70% isopropyl alcohol) Safety Precautions: Aspiration looking for blood return was conducted prior to all injections. At no point did we inject any substances, as a needle was being advanced. No attempts were made at seeking any paresthesias. Safe injection practices and needle disposal techniques used. Medications properly checked for expiration dates. SDV (single dose vial) medications used.   Description of the Procedure: Protocol guidelines were followed. The patient was placed in position over the procedure table. The target area was identified and the area prepped in the usual manner. Skin & deeper tissues infiltrated with local anesthetic. Appropriate amount of time  allowed to pass for local anesthetics to take effect. The procedure needle was advanced under fluoroscopic guidance into the sacroiliac joint until a firm endpoint was obtained. Proper needle placement secured. Negative aspiration confirmed. Solution injected in intermittent fashion, asking for systemic symptoms every 0.5cc of injectate. The needles were then removed and the area cleansed, making sure to leave some of the prepping solution back to take  advantage of its long term bactericidal properties. EBL: None Materials & Medications Used:  Needle(s) Used: 22g - 3.5" Spinal Needle(s) Solution Injected: 0.2% PF-Ropivacaine (56m) + SDV-DepoMedrol 80 mg/ml (149m Medications Administered today: We administered ropivacaine (PF) 2 mg/ml (0.2%), methylPREDNISolone acetate, midazolam, and fentaNYL.Please see chart orders for dosing details.  Imaging Guidance  Type of Imaging Technique: Fluoroscopy Guidance (Spinal) Indication(s): Assistance in needle guidance and placement for procedures requiring needle placement in or near specific anatomical locations not easily accessible without such assistance. Exposure Time: Please see nurses notes. Contrast: None used. Fluoroscopic Guidance: I was personally present in the fluoroscopy suite, where the patient was placed in position for the procedure, over the fluoroscopy-compatible table. Fluoroscopy was manipulated, using "Tunnel Vision Technique", to obtain the best possible view of the target area, on the affected side. Parallax error was corrected before commencing the procedure. A "direction-depth-direction" technique was used to introduce the needle under continuous pulsed fluoroscopic guidance. Once the target was reached, antero-posterior, oblique, and lateral fluoroscopic projection views were taken to confirm needle placement in all planes. Permanently recorded images stored by scanning into EMR. Interpretation: No contrast injected. Intraoperative imaging  interpretation by performing Physician. Adequate needle placement confirmed. Permanent hardcopy images in multiple planes scanned into the patient's record.  Antibiotic Prophylaxis:  Indication(s): No indications identified. Type:  Antibiotics Given (last 72 hours)    None       Post-operative Assessment  Complications: No immediate post-treatment complications were observed. Disposition: The patient was discharged home, once institutional criteria were met. Return to clinic in 2 weeks for follow-up evaluation and interpretation of results. The patient tolerated the entire procedure well. A repeat set of vitals were taken after the procedure and the patient was kept under observation following institutional policy, for this type of procedure. Post-procedural neurological assessment was performed, showing return to baseline, prior to discharge. The patient was provided with post-procedure discharge instructions, including a section on how to identify potential problems. Should any problems arise concerning this procedure, the patient was given instructions to immediately contact us[place]oreaat any time, without hesitation. In any case, we plan to contact the patient by telephone for a follow-up status report regarding this interventional procedure. Comments:  No additional relevant information. The patient will be referred to neurosurgery for evaluation and treatment of her severe lumbar spinal stenosis at the L3-4 and L4-5 level.  Medications administered during this visit: We administered ropivacaine (PF) 2 mg/ml (0.2%), methylPREDNISolone acetate, midazolam, and fentaNYL.  Future Appointments Date Time Provider [place]3/22/2017 8:40 AM [facility]Milinda [facility]    Primary Care Physician: HOKasandra KnudsenNP [facility] Note by: FrKathlen BrunswickNaDossie ArbourM.D, DABA, DABAPM, DABPM, DABIPP, FIPP  Disclaimer:  Medicine is not an exact  science. The only guarantee in medicine is that nothing is guaranteed. It is important to note that the decision to proceed with this intervention was based on the information collected from the patient. The Data and conclusions were drawn from the patient's questionnaire, the interview, and the physical examination. Because the information was provided in large part by the patient, it cannot be guaranteed that it has not been purposely or unconsciously manipulated. Every effort has been made to obtain as much relevant data as possible for this evaluation. It is important to note that the conclusions that lead to this procedure are derived in large part from the available data. Always take into account that the treatment will also be dependent  on availability of resources and existing treatment guidelines, considered by other Pain Management Practitioners as being common knowledge and practice, at the time of the intervention. For Medico-Legal purposes, it is also important to point out that variation in procedural techniques and pharmacological choices are the acceptable norm. The indications, contraindications, technique, and results of the above procedure should only be interpreted and judged by a Board-Certified Interventional Pain Specialist with extensive familiarity and expertise in the same exact procedure and technique. Attempts at providing opinions without similar or greater experience and expertise than that of the treating physician will be considered as inappropriate and unethical, and shall result in a formal complaint to the state medical board and applicable specialty societies.

## 2015-05-26 ENCOUNTER — Telehealth: Payer: Self-pay | Admitting: *Deleted

## 2015-05-26 NOTE — Telephone Encounter (Signed)
No problems; having little pain, will call if any concerns

## 2015-06-01 ENCOUNTER — Other Ambulatory Visit: Payer: Self-pay | Admitting: Pain Medicine

## 2015-06-14 ENCOUNTER — Encounter: Payer: Medicare Other | Admitting: Pain Medicine

## 2015-06-25 ENCOUNTER — Emergency Department: Payer: Medicare Other

## 2015-06-25 ENCOUNTER — Encounter: Payer: Self-pay | Admitting: Emergency Medicine

## 2015-06-25 ENCOUNTER — Inpatient Hospital Stay
Admission: EM | Admit: 2015-06-25 | Discharge: 2015-06-27 | DRG: 190 | Disposition: A | Discharge status: Home Health | Payer: Medicare Other | Attending: Internal Medicine | Admitting: Internal Medicine

## 2015-06-25 DIAGNOSIS — E785 Hyperlipidemia, unspecified: Secondary | ICD-10-CM | POA: Diagnosis not present

## 2015-06-25 DIAGNOSIS — J90 Pleural effusion, not elsewhere classified: Secondary | ICD-10-CM

## 2015-06-25 DIAGNOSIS — J440 Chronic obstructive pulmonary disease with acute lower respiratory infection: Secondary | ICD-10-CM | POA: Diagnosis present

## 2015-06-25 DIAGNOSIS — E871 Hypo-osmolality and hyponatremia: Secondary | ICD-10-CM | POA: Diagnosis not present

## 2015-06-25 DIAGNOSIS — J181 Lobar pneumonia, unspecified organism: Secondary | ICD-10-CM

## 2015-06-25 DIAGNOSIS — R0902 Hypoxemia: Secondary | ICD-10-CM | POA: Diagnosis present

## 2015-06-25 DIAGNOSIS — Z7984 Long term (current) use of oral hypoglycemic drugs: Secondary | ICD-10-CM | POA: Diagnosis not present

## 2015-06-25 DIAGNOSIS — F329 Major depressive disorder, single episode, unspecified: Secondary | ICD-10-CM | POA: Diagnosis not present

## 2015-06-25 DIAGNOSIS — I248 Other forms of acute ischemic heart disease: Secondary | ICD-10-CM | POA: Diagnosis not present

## 2015-06-25 DIAGNOSIS — J189 Pneumonia, unspecified organism: Secondary | ICD-10-CM | POA: Diagnosis present

## 2015-06-25 DIAGNOSIS — I10 Essential (primary) hypertension: Secondary | ICD-10-CM | POA: Diagnosis present

## 2015-06-25 DIAGNOSIS — Z23 Encounter for immunization: Secondary | ICD-10-CM | POA: Diagnosis not present

## 2015-06-25 DIAGNOSIS — E119 Type 2 diabetes mellitus without complications: Secondary | ICD-10-CM | POA: Diagnosis not present

## 2015-06-25 DIAGNOSIS — E7800 Pure hypercholesterolemia, unspecified: Secondary | ICD-10-CM | POA: Diagnosis not present

## 2015-06-25 DIAGNOSIS — Z880 Allergy status to penicillin: Secondary | ICD-10-CM

## 2015-06-25 DIAGNOSIS — F17210 Nicotine dependence, cigarettes, uncomplicated: Secondary | ICD-10-CM | POA: Diagnosis present

## 2015-06-25 DIAGNOSIS — R7989 Other specified abnormal findings of blood chemistry: Secondary | ICD-10-CM

## 2015-06-25 DIAGNOSIS — R6510 Systemic inflammatory response syndrome (SIRS) of non-infectious origin without acute organ dysfunction: Secondary | ICD-10-CM

## 2015-06-25 DIAGNOSIS — J918 Pleural effusion in other conditions classified elsewhere: Secondary | ICD-10-CM | POA: Diagnosis not present

## 2015-06-25 DIAGNOSIS — R778 Other specified abnormalities of plasma proteins: Secondary | ICD-10-CM

## 2015-06-25 DIAGNOSIS — Z79899 Other long term (current) drug therapy: Secondary | ICD-10-CM

## 2015-06-25 DIAGNOSIS — R0600 Dyspnea, unspecified: Secondary | ICD-10-CM

## 2015-06-25 DIAGNOSIS — Z8249 Family history of ischemic heart disease and other diseases of the circulatory system: Secondary | ICD-10-CM

## 2015-06-25 DIAGNOSIS — R531 Weakness: Secondary | ICD-10-CM

## 2015-06-25 DIAGNOSIS — Z7982 Long term (current) use of aspirin: Secondary | ICD-10-CM

## 2015-06-25 LAB — BASIC METABOLIC PANEL
Anion gap: 4 — ABNORMAL LOW (ref 5–15)
BUN: 15 mg/dL (ref 6–20)
CALCIUM: 8.9 mg/dL (ref 8.9–10.3)
CO2: 24 mmol/L (ref 22–32)
CREATININE: 0.9 mg/dL (ref 0.44–1.00)
Chloride: 106 mmol/L (ref 101–111)
GFR calc non Af Amer: >60 mL/min (ref >60)
GLUCOSE: 93 mg/dL (ref 65–99)
Potassium: 4.1 mmol/L (ref 3.5–5.1)
Sodium: 134 mmol/L — ABNORMAL LOW (ref 135–145)

## 2015-06-25 LAB — CBC
HCT: 41.4 % (ref 35.0–47.0)
Hemoglobin: 13.8 g/dL (ref 12.0–16.0)
MCH: 28.7 pg (ref 26.0–34.0)
MCHC: 33.4 g/dL (ref 32.0–36.0)
MCV: 86 fL (ref 80.0–100.0)
PLATELETS: 324 10*3/uL (ref 150–440)
RBC: 4.81 MIL/uL (ref 3.80–5.20)
RDW: 14.9 % — AB (ref 11.5–14.5)
WBC: 6.1 10*3/uL (ref 3.6–11.0)

## 2015-06-25 LAB — URINALYSIS COMPLETE WITH MICROSCOPIC (ARMC ONLY)
BACTERIA UA: NONE SEEN
BILIRUBIN URINE: NEGATIVE
GLUCOSE, UA: NEGATIVE mg/dL
Hgb urine dipstick: NEGATIVE
Ketones, ur: NEGATIVE mg/dL
Leukocytes, UA: NEGATIVE
Nitrite: NEGATIVE
Protein, ur: NEGATIVE mg/dL
RBC / HPF: NONE SEEN RBC/hpf (ref 0–5)
Specific Gravity, Urine: 1.02 (ref 1.005–1.030)
pH: 6 (ref 5.0–8.0)

## 2015-06-25 LAB — TROPONIN I
TROPONIN I: 0.09 ng/mL — AB (ref <0.031)
Troponin I: 0.11 ng/mL — ABNORMAL HIGH (ref <0.031)

## 2015-06-25 LAB — RAPID INFLUENZA A&B ANTIGENS (ARMC ONLY)
INFLUENZA A (ARMC): NEGATIVE
INFLUENZA B (ARMC): NEGATIVE

## 2015-06-25 LAB — BRAIN NATRIURETIC PEPTIDE: B NATRIURETIC PEPTIDE 5: 27 pg/mL (ref 0.0–100.0)

## 2015-06-25 MED ORDER — GUAIFENESIN ER 600 MG PO TB12
600.0000 mg | ORAL_TABLET | Freq: Two times a day (BID) | ORAL | Status: DC
  Administered 2015-06-25 – 2015-06-27 (×4): 600 mg via ORAL
  Filled 2015-06-25 (×4): qty 1

## 2015-06-25 MED ORDER — LEVOFLOXACIN IN D5W 750 MG/150ML IV SOLN
750.0000 mg | INTRAVENOUS | Status: DC
  Administered 2015-06-26: 750 mg via INTRAVENOUS
  Filled 2015-06-25 (×2): qty 150

## 2015-06-25 MED ORDER — ONDANSETRON HCL 4 MG/2ML IJ SOLN
4.0000 mg | Freq: Once | INTRAMUSCULAR | Status: AC
  Administered 2015-06-25: 4 mg via INTRAVENOUS
  Filled 2015-06-25: qty 2

## 2015-06-25 MED ORDER — LEVOFLOXACIN IN D5W 750 MG/150ML IV SOLN
750.0000 mg | Freq: Once | INTRAVENOUS | Status: DC
  Administered 2015-06-25: 750 mg via INTRAVENOUS
  Filled 2015-06-25: qty 150

## 2015-06-25 MED ORDER — METOPROLOL TARTRATE 25 MG PO TABS
25.0000 mg | ORAL_TABLET | Freq: Two times a day (BID) | ORAL | Status: DC
  Administered 2015-06-25 – 2015-06-27 (×4): 25 mg via ORAL
  Filled 2015-06-25 (×4): qty 1

## 2015-06-25 MED ORDER — PANTOPRAZOLE SODIUM 40 MG PO TBEC
40.0000 mg | DELAYED_RELEASE_TABLET | Freq: Every day | ORAL | Status: DC
  Administered 2015-06-26 – 2015-06-27 (×2): 40 mg via ORAL
  Filled 2015-06-25 (×2): qty 1

## 2015-06-25 MED ORDER — BUDESONIDE 0.5 MG/2ML IN SUSP
2.0000 mL | Freq: Two times a day (BID) | RESPIRATORY_TRACT | Status: DC
  Administered 2015-06-25 – 2015-06-27 (×4): 0.5 mg via RESPIRATORY_TRACT
  Filled 2015-06-25 (×4): qty 2

## 2015-06-25 MED ORDER — LEVOFLOXACIN IN D5W 750 MG/150ML IV SOLN: 750.0000 mg | Freq: Once | INTRAVENOUS | Status: DC

## 2015-06-25 MED ORDER — METHYLPREDNISOLONE SODIUM SUCC 125 MG IJ SOLR
60.0000 mg | INTRAMUSCULAR | Status: DC
  Administered 2015-06-25 – 2015-06-26 (×2): 60 mg via INTRAVENOUS
  Filled 2015-06-25 (×2): qty 2

## 2015-06-25 MED ORDER — ENOXAPARIN SODIUM 40 MG/0.4ML ~~LOC~~ SOLN: 40.0000 mg | SUBCUTANEOUS | Status: DC

## 2015-06-25 MED ORDER — MAGNESIUM OXIDE 400 (241.3 MG) MG PO TABS
400.0000 mg | ORAL_TABLET | Freq: Every day | ORAL | Status: DC
  Administered 2015-06-26 – 2015-06-27 (×2): 400 mg via ORAL
  Filled 2015-06-25 (×2): qty 1

## 2015-06-25 MED ORDER — ONDANSETRON HCL 4 MG PO TABS
4.0000 mg | ORAL_TABLET | Freq: Four times a day (QID) | ORAL | Status: DC | PRN
  Administered 2015-06-26: 4 mg via ORAL
  Filled 2015-06-25: qty 1

## 2015-06-25 MED ORDER — ENALAPRIL MALEATE 5 MG PO TABS
5.0000 mg | ORAL_TABLET | Freq: Every day | ORAL | Status: DC
  Administered 2015-06-26: 5 mg via ORAL
  Filled 2015-06-25: qty 1

## 2015-06-25 MED ORDER — LEVOFLOXACIN IN D5W 750 MG/150ML IV SOLN
750.0000 mg | INTRAVENOUS | Status: DC
  Filled 2015-06-25: qty 150

## 2015-06-25 MED ORDER — ACETAMINOPHEN 650 MG RE SUPP: 650.0000 mg | Freq: Four times a day (QID) | RECTAL | Status: DC | PRN

## 2015-06-25 MED ORDER — SODIUM CHLORIDE 0.9 % IV BOLUS (SEPSIS)
1000.0000 mL | Freq: Once | INTRAVENOUS | Status: AC
  Administered 2015-06-25: 1000 mL via INTRAVENOUS

## 2015-06-25 MED ORDER — NICOTINE 21 MG/24HR TD PT24
21.0000 mg | MEDICATED_PATCH | Freq: Every day | TRANSDERMAL | Status: DC
  Administered 2015-06-25 – 2015-06-27 (×3): 21 mg via TRANSDERMAL
  Filled 2015-06-25 (×3): qty 1

## 2015-06-25 MED ORDER — DULOXETINE HCL 30 MG PO CPEP
60.0000 mg | ORAL_CAPSULE | Freq: Every day | ORAL | Status: DC
  Administered 2015-06-26: 60 mg via ORAL
  Filled 2015-06-25: qty 2

## 2015-06-25 MED ORDER — DOCUSATE SODIUM 100 MG PO CAPS
100.0000 mg | ORAL_CAPSULE | Freq: Two times a day (BID) | ORAL | Status: DC
  Administered 2015-06-25 – 2015-06-27 (×4): 100 mg via ORAL
  Filled 2015-06-25 (×4): qty 1

## 2015-06-25 MED ORDER — NITROGLYCERIN 2 % TD OINT
0.5000 [in_us] | TOPICAL_OINTMENT | Freq: Three times a day (TID) | TRANSDERMAL | Status: DC
  Administered 2015-06-25 – 2015-06-26 (×2): 0.5 [in_us] via TOPICAL
  Filled 2015-06-25 (×2): qty 1

## 2015-06-25 MED ORDER — PNEUMOCOCCAL VAC POLYVALENT 25 MCG/0.5ML IJ INJ
0.5000 mL | INJECTION | INTRAMUSCULAR | Status: AC
  Administered 2015-06-26: 0.5 mL via INTRAMUSCULAR
  Filled 2015-06-25: qty 0.5

## 2015-06-25 MED ORDER — ACETAMINOPHEN 325 MG PO TABS
650.0000 mg | ORAL_TABLET | Freq: Four times a day (QID) | ORAL | Status: DC | PRN
  Administered 2015-06-26 – 2015-06-27 (×3): 650 mg via ORAL
  Filled 2015-06-25 (×3): qty 2

## 2015-06-25 MED ORDER — ASPIRIN EC 81 MG PO TBEC
81.0000 mg | DELAYED_RELEASE_TABLET | Freq: Every day | ORAL | Status: DC
  Administered 2015-06-26 – 2015-06-27 (×2): 81 mg via ORAL
  Filled 2015-06-25 (×2): qty 1

## 2015-06-25 MED ORDER — IPRATROPIUM BROMIDE 0.02 % IN SOLN
0.5000 mg | Freq: Four times a day (QID) | RESPIRATORY_TRACT | Status: DC
  Administered 2015-06-25 – 2015-06-27 (×6): 0.5 mg via RESPIRATORY_TRACT
  Filled 2015-06-25 (×7): qty 2.5

## 2015-06-25 MED ORDER — ASPIRIN EC 325 MG PO TBEC
325.0000 mg | DELAYED_RELEASE_TABLET | Freq: Once | ORAL | Status: AC
  Administered 2015-06-25: 325 mg via ORAL
  Filled 2015-06-25: qty 1

## 2015-06-25 MED ORDER — OXYCODONE HCL 5 MG PO TABS
5.0000 mg | ORAL_TABLET | ORAL | Status: DC | PRN
  Administered 2015-06-25 – 2015-06-27 (×7): 5 mg via ORAL
  Filled 2015-06-25 (×7): qty 1

## 2015-06-25 MED ORDER — ALBUTEROL SULFATE (2.5 MG/3ML) 0.083% IN NEBU
2.5000 mg | INHALATION_SOLUTION | Freq: Four times a day (QID) | RESPIRATORY_TRACT | Status: DC
  Administered 2015-06-25 – 2015-06-27 (×6): 2.5 mg via RESPIRATORY_TRACT
  Filled 2015-06-25 (×7): qty 3

## 2015-06-25 MED ORDER — MIRTAZAPINE 15 MG PO TABS
45.0000 mg | ORAL_TABLET | Freq: Every day | ORAL | Status: DC
  Administered 2015-06-25 – 2015-06-26 (×2): 45 mg via ORAL
  Filled 2015-06-25 (×2): qty 3

## 2015-06-25 MED ORDER — SODIUM CHLORIDE 0.9% FLUSH
3.0000 mL | Freq: Two times a day (BID) | INTRAVENOUS | Status: DC
  Administered 2015-06-25 – 2015-06-27 (×4): 3 mL via INTRAVENOUS

## 2015-06-25 MED ORDER — SIMVASTATIN 40 MG PO TABS
40.0000 mg | ORAL_TABLET | Freq: Every day | ORAL | Status: DC
  Administered 2015-06-26: 40 mg via ORAL
  Filled 2015-06-25: qty 1

## 2015-06-25 MED ORDER — ENOXAPARIN SODIUM 40 MG/0.4ML ~~LOC~~ SOLN
40.0000 mg | SUBCUTANEOUS | Status: DC
  Administered 2015-06-25 – 2015-06-26 (×2): 40 mg via SUBCUTANEOUS
  Filled 2015-06-25 (×2): qty 0.4

## 2015-06-25 MED ORDER — HYDROMORPHONE HCL 1 MG/ML IJ SOLN
0.5000 mg | Freq: Once | INTRAMUSCULAR | Status: AC
  Administered 2015-06-25: 0.5 mg via INTRAVENOUS
  Filled 2015-06-25: qty 1

## 2015-06-25 MED ORDER — ONDANSETRON HCL 4 MG/2ML IJ SOLN: 4.0000 mg | Freq: Four times a day (QID) | INTRAMUSCULAR | Status: DC | PRN

## 2015-06-25 MED ORDER — GABAPENTIN 400 MG PO CAPS
800.0000 mg | ORAL_CAPSULE | Freq: Three times a day (TID) | ORAL | Status: DC
  Administered 2015-06-25 – 2015-06-27 (×5): 800 mg via ORAL
  Filled 2015-06-25 (×5): qty 2

## 2015-06-25 NOTE — H&P (Addendum)
[facility] NAME: Charlene Williams    MR#:  [id number]  DATE OF BIRTH:  [birth year]  DATE OF ADMISSION:  06/25/2015  PRIMARY CARE PHYSICIAN: Kasandra Knudsen, NP   REQUESTING/REFERRING PHYSICIAN:   CHIEF COMPLAINT:   Chief Complaint  Patient presents with  . Cough    HISTORY OF PRESENT ILLNESS: Charlene Williams  is a 60 y.o. female with a known history of Hypertension, hyperlipidemia, diabetes mellitus, COPD, not on oxygen at home who presents to the hospital with complaints of about 1 month history of cough, phlegm production, feeling weak, tired, having fevers and chills.  Patient was seen by primary care physician a few weeks ago and was diagnosed with pneumonia, she was initiated on doxycycline and prednisone, however, did not improve her. She said in hurting in left side of the chest about a week ago. She was seen again by primary care physician and x-rays were ordered. Patient was also told that she will need CT scan of the chest, which is scheduled for 06/27/2015. Over the past day or so she's been weak and tired, having headaches, feeling feverish and chilly has sweats. She decided to come to emergency room for further evaluation. In emergency room, she was noted to be hypoxic, O2 sats of 89% on room air. She has labs revealed mild elevation of troponin to 0.09, hyponatremia 134, but no leukocytosis. Chest x-ray showed moderate left pleural effusion. Hospitalist services were contacted for admission.  PAST MEDICAL HISTORY:   Past Medical History  Diagnosis Date  . Diabetes mellitus without complication ([place])   . Sleep apnea   . Hypertension   . Aneurysm ([place])   . Hiatal hernia   . COPD (chronic obstructive pulmonary disease) ([place])   . Illicit drug use   . Migraines   . Hypercholesteremia   . Depression   . Spine disorder   . Family history of chronic pain 01/17/2015    PAST SURGICAL HISTORY: Past Surgical History   Procedure Laterality Date  . Abdominal hysterectomy    . Hernia repair    . Eye surgery      SOCIAL HISTORY:  Social History  Substance Use Topics  . Smoking status: Current Every Day Smoker -- 0.50 packs/day for 45 years    Types: Cigarettes  . Smokeless tobacco: Not on file  . Alcohol Use: No    FAMILY HISTORY:  Family History  Problem Relation Age of Onset  . Heart disease Mother     DRUG ALLERGIES:  Allergies  Allergen Reactions  . Ampicillin Hives  . Penicillins Hives    Review of Systems  Constitutional: Positive for fever, chills, weight loss, malaise/fatigue and diaphoresis.  HENT: Negative for congestion.   Eyes: Negative for blurred vision and double vision.  Respiratory: Positive for cough, shortness of breath and wheezing. Negative for sputum production.   Cardiovascular: Positive for chest pain, palpitations and leg swelling. Negative for orthopnea and PND.  Gastrointestinal: Positive for nausea, abdominal pain and diarrhea. Negative for vomiting, constipation, blood in stool and melena.  Genitourinary: Negative for dysuria, urgency, frequency and hematuria.  Musculoskeletal: Positive for myalgias. Negative for falls.  Skin: Negative for rash.  Neurological: Positive for headaches. Negative for dizziness and weakness.  Psychiatric/Behavioral: Negative for depression and memory loss. The patient is not nervous/anxious.     MEDICATIONS AT HOME:  Prior to Admission medications   Medication Sig Start Date End Date Taking? Authorizing Provider  aspirin  81 MG tablet Take 81 mg by mouth daily.    Historical Provider, MD  DULoxetine (CYMBALTA) 60 MG capsule Take 60 mg by mouth daily.    Historical Provider, MD  enalapril (VASOTEC) 5 MG tablet Take 5 mg by mouth daily.    Historical Provider, MD  fluticasone (FLOVENT HFA) 220 MCG/ACT inhaler Inhale 1 puff into the lungs 2 (two) times daily.    Historical Provider, MD  gabapentin (NEURONTIN) 800 MG tablet Take 1  tablet (800 mg total) by mouth 3 (three) times daily. 05/15/15   Milinda Pointer, MD  Magnesium 400 MG TABS Take 1 tablet by mouth daily. 05/15/15   Milinda Pointer, MD  metFORMIN (GLUCOPHAGE-XR) 750 MG 24 hr tablet Take 750 mg by mouth daily with breakfast.    Historical Provider, MD  omeprazole (PRILOSEC) 20 MG capsule Take 20 mg by mouth daily.    Historical Provider, MD  simvastatin (ZOCOR) 40 MG tablet Take 40 mg by mouth daily.    Historical Provider, MD      PHYSICAL EXAMINATION:   VITAL SIGNS: Blood pressure 120/85, pulse 92, temperature 98.5 F (36.9 C), temperature source Oral, resp. rate 20, height '5\' 3"'$  (1.6 m), weight 72.576 kg (160 lb), SpO2 97 %.  GENERAL:  60 y.o.-year-old patient lying in the bed  In moderate distress. Suffering facial expression, uncomfortable EYES: Pupils equal, round, reactive to light and accommodation. No scleral icterus. Extraocular muscles intact.  HEENT: Head atraumatic, normocephalic. Oropharynx and nasopharynx clear.  NECK:  Supple, no jugular venous distention. No thyroid enlargement, no tenderness.  LUNGS: Diminished breath sounds bilaterally, more in left posterior lower aspect of the lungs, scattered wheezing, few rales,rhonchi and crepitations, better audible in left posterolateral lower lung zone. Not using accessory muscles of respiration, overall, has difficulty taking deep breaths due to splinting.  CARDIOVASCULAR: S1, S2 normal. No murmurs, rubs, or gallops.  ABDOMEN: Soft, tender in left upper quadrant just under the rib cage, nondistended. Bowel sounds present. No organomegaly or mass.  EXTREMITIES: Trace pedal edema, no cyanosis, or clubbing.  NEUROLOGIC: Cranial nerves II through XII are intact. Muscle strength 5/5 in all extremities. Sensation intact. Gait not checked.  PSYCHIATRIC: The patient is alert and oriented x 3.  SKIN: No obvious rash, lesion, or ulcer.   LABORATORY PANEL:   CBC  Recent Labs Lab 06/25/15 1241  WBC  6.1  HGB 13.8  HCT 41.4  PLT 324  MCV 86.0  MCH 28.7  MCHC 33.4  RDW 14.9*   ------------------------------------------------------------------------------------------------------------------  Chemistries   Recent Labs Lab 06/25/15 1241  NA 134*  K 4.1  CL 106  CO2 24  GLUCOSE 93  BUN 15  CREATININE 0.90  CALCIUM 8.9   ------------------------------------------------------------------------------------------------------------------  Cardiac Enzymes  Recent Labs Lab 06/25/15 1241  TROPONINI 0.09*   ------------------------------------------------------------------------------------------------------------------  RADIOLOGY: Dg Chest 2 View  06/25/2015  CLINICAL DATA:  Left-sided chest pain. EXAM: CHEST  2 VIEW COMPARISON:  April 03, 2012. FINDINGS: There is interval development of moderate left pleural effusion with probable underlying atelectasis or infiltrate. Right lung is clear. No pneumothorax is noted. Bony thorax is unremarkable. IMPRESSION: Interval development of moderate left pleural effusion with probable underlying atelectasis or infiltrate. Electronically Signed   By: Marijo Conception, M.D.   On: 06/25/2015 13:26    EKG: Orders placed or performed during the hospital encounter of 06/25/15  . ED EKG within 10 minutes  . ED EKG within 10 minutes  . EKG 12-Lead  . EKG  12-Lead  EKG in the emergency room revealed normal sinus rhythm at 96 bpm, normal axis, no acute ST-T changes  IMPRESSION AND PLAN:  Active Problems:   SIRS (systemic inflammatory response syndrome) (HCC)   Pleural effusion, left   Left lower lobe pneumonia   Hypoxemia   Elevated troponin   Hyponatremia   Generalized weakness #1. Left lower lobe pneumonia with left-sided pleural effusion, questionable empyema, admit patient to medical floor initiated her on levofloxacin intravenously, get CT of the chest and left thoracentesis to confirm or rule out empyema. #2. Hypoxemia, continue  oxygen therapy #3. COPD exacerbation due to pneumonia, initiate patient on duo nebs, continue oxygen, initiate steroids intravenously #4. Elevated troponin, likely demand ischemia, start patient on aspirin, metoprolol, nitroglycerin, continue Lovenox subcutaneously, check cardiac enzymes 3, get echocardiogram done, get cardiologist input #5. Generalized weakness, get physical therapist involved #6. Tobacco abuse. Counseling, discussed this patient for 4 minutes, nicotine replacement therapy will be initiated   All the records are reviewed and case discussed with ED provider. Management plans discussed with the patient, family and they are in agreement.  CODE STATUS: Code Status History    This patient does not have a recorded code status. Please follow your organizational policy for patients in this situation.       TOTAL TIME TAKING CARE OF THIS PATIENT: 55 minutes.    Theodoro Grist M.D on 06/25/2015 at 5:05 PM  Between 7am to 6pm - Pager - [phone] After 6pm go to [email or web address] - password [email or web address] [facility]  [facility]  Office  [phone]  CC: Primary care physician; Kasandra Knudsen, NP

## 2015-06-25 NOTE — ED Provider Notes (Addendum)
[facility] Emergency Department Provider Note  ____________________________________________  Time seen: Approximately 4:08 PM  I have reviewed the triage vital signs and the nursing notes.   HISTORY  Chief Complaint Cough    HPI Charlene Williams is a 60 y.o. female with a history of HTN, HL, DM, COPD not on oxygen,sent by her PCP for failure of outpatient treatment of pneumonia. Patient reports that a few weeks ago she was given oral antibiotics for a diagnosis of pneumonia. Since then, she has persisted with her productive cough, has had nausea without vomiting, congestion and rhinorrhea. No sore throat or ear pain. She also has upper abdominal pain which she attributes to the amount of coughing that she has had. She has exertional shortness of breath and a left sided chest pain without palpitations, syncope. She does have postural lightheadedness and has had poor by mouth intake.   Past Medical History  Diagnosis Date  . Diabetes mellitus without complication ([place])   . Sleep apnea   . Hypertension   . Aneurysm ([place])   . Hiatal hernia   . COPD (chronic obstructive pulmonary disease) ([place])   . Illicit drug use   . Migraines   . Hypercholesteremia   . Depression   . Spine disorder   . Family history of chronic pain 01/17/2015    Patient Active Problem List   Diagnosis Date Noted  . Chronic lower extremity pain (Bilateral) 05/19/2015  . Substance use disorder (SUB Risk: Very high) 05/19/2015  . Myofascial pain 05/15/2015  . Muscle spasms of lower extremity 05/15/2015  . Chronic sacroiliac joint pain (Location of Primary Source of Pain) (Bilateral) (R>L) 05/15/2015  . Neurogenic pain 05/15/2015  . Avitaminosis D 05/15/2015  . Chronic pain syndrome 01/17/2015  . Chronic pain 01/17/2015  . Lumbar facet syndrome (Location of Primary Source of Pain) (Bilateral) (R>L) 01/17/2015  . Chronic low back pain (Location of Primary Source of Pain) (Bilateral)  (R>L) 01/17/2015  . Chronic radicular lumbar pain (Bilateral) 01/17/2015  . Osteoarthritis of spine with radiculopathy, lumbar region 01/17/2015  . Polysubstance abuse 01/17/2015  . Lumbar spondylosis 01/17/2015  . Lumbar facet arthropathy (Bilateral) 01/17/2015  . Magnesium deficiency 01/17/2015  . Lumbar spinal stenosis (Central Spinal Stenosis) (Severe) (L3-4 and L4-5) 01/17/2015  . Family history of chronic pain 01/17/2015  . Nicotine dependence 01/17/2015  . Smoker 01/17/2015  . Tobacco abuse 01/17/2015  . Essential hypertension, benign 01/17/2015  . Chronic obstructive pulmonary disease (COPD) ([place]) 01/17/2015  . Emphysema of lung ([place]) 01/17/2015  . History of bronchitis 01/17/2015  . History of shortness of breath 01/17/2015  . Generalized anxiety disorder 01/17/2015  . Seizure disorder ([place]) 01/17/2015  . Depression 01/17/2015  . History of panic attacks 01/17/2015  . Non-insulin dependent type 2 diabetes mellitus ([place]) 01/17/2015    Past Surgical History  Procedure Laterality Date  . Abdominal hysterectomy    . Hernia repair    . Eye surgery      Current Outpatient Rx  Name  Route  Sig  Dispense  Refill  . aspirin 81 MG tablet   Oral   Take 81 mg by mouth daily.         . DULoxetine (CYMBALTA) 60 MG capsule   Oral   Take 60 mg by mouth daily.         . enalapril (VASOTEC) 5 MG tablet   Oral   Take 5 mg by mouth daily.         Marland Kitchen  fluticasone (FLOVENT HFA) 220 MCG/ACT inhaler   Inhalation   Inhale 1 puff into the lungs 2 (two) times daily.         Marland Kitchen gabapentin (NEURONTIN) 800 MG tablet   Oral   Take 1 tablet (800 mg total) by mouth 3 (three) times daily.   120 tablet   0     Do not place this medication, or any other prescri ...   . Magnesium 400 MG TABS   Oral   Take 1 tablet by mouth daily.   30 tablet   PRN     Do not place this medication, or any other prescri ...   . metFORMIN (GLUCOPHAGE-XR) 750 MG 24 hr tablet   Oral   Take 750  mg by mouth daily with breakfast.         . omeprazole (PRILOSEC) 20 MG capsule   Oral   Take 20 mg by mouth daily.         . simvastatin (ZOCOR) 40 MG tablet   Oral   Take 40 mg by mouth daily.           Allergies Ampicillin and Penicillins  Family History  Problem Relation Age of Onset  . Heart disease Mother     Social History Social History  Substance Use Topics  . Smoking status: Current Every Day Smoker -- 0.50 packs/day for 45 years    Types: Cigarettes  . Smokeless tobacco: None  . Alcohol Use: No    Review of Systems Constitutional: Positive subjective fever and shaking chills. Positive postural lightheadedness. Negative syncope. Eyes: No visual changes. No eye discharge. ENT: No sore throat. Positive congestion without rhinorrhea. Cardiovascular: Denies chest pain. Denies palpitations. Respiratory: Positive exertional shortness of breath.  Positive productive cough. Gastrointestinal: No abdominal pain.  Nausea nausea, no vomiting.  No diarrhea.  No constipation. Genitourinary: Negative for dysuria. Musculoskeletal: Negative for back pain. Skin: Negative for rash. Neurological: Negative for headaches. No focal numbness, tingling or weakness.   10-point ROS otherwise negative.  ____________________________________________   PHYSICAL EXAM:  VITAL SIGNS: ED Triage Vitals  Enc Vitals Group     BP 06/25/15 1240 120/85 mmHg     Pulse Rate 06/25/15 1240 92     Resp 06/25/15 1240 20     Temp 06/25/15 1240 98.5 F (36.9 C)     Temp Source 06/25/15 1240 Oral     SpO2 06/25/15 1240 97 %     Weight 06/25/15 1240 160 lb (72.576 kg)     Height 06/25/15 1240 '5\' 3"'$  (1.6 m)     Head Cir --      Peak Flow --      Pain Score 06/25/15 1238 9     Pain Loc --      Pain Edu? --      Excl. in [place]? --     Constitutional: Alert and oriented. Well appearing and in no acute distress. Answers questions appropriately. Eyes: Conjunctivae are normal.  EOMI. No  scleral icterus. Head: Atraumatic. Nose: No congestion/rhinnorhea. Mouth/Throat: Mucous membranes are dry. No posterior pharyngeal erythema, tonsillar swelling or exudate. Posterior palate is symmetric and uvula is midline..  Neck: No stridor.  Supple.  No JVD. No meningismus. Cardiovascular: Normal rate, regular rhythm. No murmurs, rubs or gallops.  Respiratory: Pulse ox is 89% with a good tracing on my exam. Mild tachypnea but able to speak in full sentences. Mild accessory muscle use without retractions. No wheezes rales or rhonchi.  Gastrointestinal:  Mild tenderness to palpation diffusely in the upper abdomen which is also reproduced with any contraction of her rectus muscles Soft and nondistended.  No guarding or rebound.  No peritoneal signs. Musculoskeletal: Minimal nonpitting edema that is symmetric to just above the ankles bilaterally. No ttp in the calves or palpable cords.  Negative Homan's sign. Neurologic:  A&Ox3.  Speech is clear.  Face and smile are symmetric.  EOMI.  Moves all extremities well. Skin:  Skin is warm, dry and intact. No rash noted. Psychiatric: Mood and affect are normal. Speech and behavior are normal.  Normal judgement.  ____________________________________________   LABS (all labs ordered are listed, but only abnormal results are displayed)  Labs Reviewed  BASIC METABOLIC PANEL - Abnormal; Notable for the following:    Sodium 134 (*)    Anion gap 4 (*)    All other components within normal limits  CBC - Abnormal; Notable for the following:    RDW 14.9 (*)    All other components within normal limits  TROPONIN I - Abnormal; Notable for the following:    Troponin I 0.09 (*)    All other components within normal limits  CULTURE, BLOOD (ROUTINE X 2)  CULTURE, BLOOD (ROUTINE X 2)  URINE CULTURE  RAPID INFLUENZA A&B ANTIGENS ([facility] ONLY)  URINALYSIS COMPLETEWITH MICROSCOPIC ([facility] ONLY)  BRAIN NATRIURETIC PEPTIDE    ____________________________________________  EKG  ED ECG REPORT I, Eula Listen, the attending physician, personally viewed and interpreted this ECG.   Date: 06/25/2015  EKG Time: 1242  Rate: 96  Rhythm: normal sinus rhythm  Axis: Normal  Intervals:none  ST&T Change: Nonspecific T-wave inversions in V1. No ST elevation.  ____________________________________________  RADIOLOGY  Dg Chest 2 View  06/25/2015  CLINICAL DATA:  Left-sided chest pain. EXAM: CHEST  2 VIEW COMPARISON:  April 03, 2012. FINDINGS: There is interval development of moderate left pleural effusion with probable underlying atelectasis or infiltrate. Right lung is clear. No pneumothorax is noted. Bony thorax is unremarkable. IMPRESSION: Interval development of moderate left pleural effusion with probable underlying atelectasis or infiltrate. Electronically Signed   By: Marijo Conception, M.D.   On: 06/25/2015 13:26    ____________________________________________   PROCEDURES  Procedure(s) performed: None  Critical Care performed: No ____________________________________________   INITIAL IMPRESSION / ASSESSMENT AND PLAN / ED COURSE  Pertinent labs & imaging results that were available during my care of the patient were reviewed by me and considered in my medical decision making (see chart for details).  60 y.o. female recently treated for pneumonia as an outpatient with oral antibiotics presenting with continued cough, general malaise, exertional shortness of breath and postural lightheadedness, fever and chills. The patient has had also some chest pain. She has had nausea without vomiting or diarrhea. The patient's chest x-ray from triage that showed that she has a left pleural effusion with a possible underlying infiltrate and clinically she has continued pneumonia. Her labs are reassuring except for her troponin which is 0.09 and will need to be trended. We'll treat her with aspirin, immediate  antibiotics, and admission to the hospital for her pneumonia with hypoxia.   ____________________________________________  FINAL CLINICAL IMPRESSION(S) / ED DIAGNOSES  Final diagnoses:  CAP (community acquired pneumonia)      NEW MEDICATIONS STARTED DURING THIS VISIT:  New Prescriptions   No medications on file     Eula Listen, MD 06/25/15 1617  Eula Listen, MD 06/25/15 1624

## 2015-06-25 NOTE — ED Notes (Signed)
Pt c/o left chest and back pain.  Was treated outpt for PNA a couple weeks ago and has finished abx but is getting worse per pt.  Pt reports went to her doctor last week and had another xray and was told she need a CT.  Does have a cough.  Has been sweating.

## 2015-06-26 ENCOUNTER — Inpatient Hospital Stay: Payer: Medicare Other

## 2015-06-26 DIAGNOSIS — J440 Chronic obstructive pulmonary disease with acute lower respiratory infection: Secondary | ICD-10-CM | POA: Diagnosis not present

## 2015-06-26 LAB — BODY FLUID CELL COUNT WITH DIFFERENTIAL
Eos, Fluid: 13 %
LYMPHS FL: 71 %
Monocyte-Macrophage-Serous Fluid: 6 %
NEUTROPHIL FLUID: 7 %
OTHER CELLS FL: 3 %
Total Nucleated Cell Count, Fluid: 677 cu mm

## 2015-06-26 LAB — GLUCOSE, SEROUS FLUID: Glucose, Fluid: 137 mg/dL

## 2015-06-26 LAB — LACTATE DEHYDROGENASE, PLEURAL OR PERITONEAL FLUID: LD, Fluid: 170 U/L — ABNORMAL HIGH (ref 3–23)

## 2015-06-26 LAB — COMPREHENSIVE METABOLIC PANEL
ALK PHOS: 93 U/L (ref 38–126)
ALT: 15 U/L (ref 14–54)
AST: 20 U/L (ref 15–41)
Albumin: 3.3 g/dL — ABNORMAL LOW (ref 3.5–5.0)
Anion gap: 6 (ref 5–15)
BUN: 11 mg/dL (ref 6–20)
CALCIUM: 8.9 mg/dL (ref 8.9–10.3)
CO2: 23 mmol/L (ref 22–32)
CREATININE: 0.79 mg/dL (ref 0.44–1.00)
Chloride: 107 mmol/L (ref 101–111)
GFR calc non Af Amer: >60 mL/min (ref >60)
Glucose, Bld: 130 mg/dL — ABNORMAL HIGH (ref 65–99)
Potassium: 4.6 mmol/L (ref 3.5–5.1)
SODIUM: 136 mmol/L (ref 135–145)
Total Bilirubin: 0.5 mg/dL (ref 0.3–1.2)
Total Protein: 6.9 g/dL (ref 6.5–8.1)

## 2015-06-26 LAB — TROPONIN I
Troponin I: 0.05 ng/mL — ABNORMAL HIGH (ref <0.031)
Troponin I: 0.04 ng/mL — ABNORMAL HIGH (ref <0.031)

## 2015-06-26 LAB — URINE CULTURE

## 2015-06-26 LAB — PROTEIN, BODY FLUID: Total protein, fluid: 4.6 g/dL

## 2015-06-26 LAB — HEMOGLOBIN A1C: Hgb A1c MFr Bld: 5.7 % (ref 4.0–6.0)

## 2015-06-26 MED ORDER — ENALAPRIL MALEATE 10 MG PO TABS
10.0000 mg | ORAL_TABLET | Freq: Every day | ORAL | Status: DC
  Administered 2015-06-27: 10 mg via ORAL
  Filled 2015-06-26: qty 1

## 2015-06-26 MED ORDER — TRAZODONE HCL 50 MG PO TABS
150.0000 mg | ORAL_TABLET | Freq: Every day | ORAL | Status: DC
  Administered 2015-06-26: 150 mg via ORAL
  Filled 2015-06-26: qty 1

## 2015-06-26 MED ORDER — DULOXETINE HCL 30 MG PO CPEP
90.0000 mg | ORAL_CAPSULE | Freq: Every day | ORAL | Status: DC
  Administered 2015-06-27: 90 mg via ORAL
  Filled 2015-06-26: qty 3

## 2015-06-26 MED ORDER — SIMVASTATIN 20 MG PO TABS
20.0000 mg | ORAL_TABLET | Freq: Every day | ORAL | Status: DC
  Administered 2015-06-27: 20 mg via ORAL
  Filled 2015-06-26: qty 1

## 2015-06-26 NOTE — Progress Notes (Signed)
Charlene Williams is a 60 y.o. female  [id number]  Primary Cardiologist: [place] Reason for Consultation: chest pains  HPI: [name] presented wirth chest pains and shortness of breath. No further chest pains. But had pneumonia for which she has been treated by [name] in our office.   Review of Systems: No orthopnea and PND  Past Medical History  Diagnosis Date  . Diabetes mellitus without complication ([place])   . Sleep apnea   . Hypertension   . Aneurysm ([place])   . Hiatal hernia   . COPD (chronic obstructive pulmonary disease) ([place])   . Illicit drug use   . Migraines   . Hypercholesteremia   . Depression   . Spine disorder   . Family history of chronic pain 01/17/2015    Facility-administered medications prior to admission  Medication Dose Route Frequency Provider Last Rate Last Dose  . fentaNYL (SUBLIMAZE) injection 100 mcg  100 mcg Intravenous UD Milinda Pointer, MD      . lidocaine (PF) (XYLOCAINE) 1 % injection 10 mL  10 mL Infiltration Once Milinda Pointer, MD      . methylPREDNISolone acetate (DEPO-MEDROL) injection 80 mg  80 mg Intra-articular Once Milinda Pointer, MD      . midazolam (VERSED) 5 MG/5ML injection 5 mg  5 mg Intravenous UD Milinda Pointer, MD      . ropivacaine (PF) 2 mg/ml (0.2%) (NAROPIN) epidural 5 mL  5 mL Other Once Milinda Pointer, MD       Medications Prior to Admission  Medication Sig Dispense Refill  . alendronate (FOSAMAX) 70 MG tablet Take 70 mg by mouth once a week. Take with a full glass of water on an empty stomach. *take on Tuesday*    . aspirin EC 81 MG tablet Take 81 mg by mouth daily.    . cetirizine (ZYRTEC) 10 MG tablet Take 10 mg by mouth at bedtime.    . DULoxetine (CYMBALTA) 30 MG capsule Take 90 mg by mouth every morning.    . enalapril (VASOTEC) 10 MG tablet Take 10 mg by mouth every morning.    . fluticasone (FLOVENT HFA) 220 MCG/ACT inhaler Inhale 1 puff into the lungs 2 (two) times daily.    Marland Kitchen  gabapentin (NEURONTIN) 800 MG tablet Take 1 tablet (800 mg total) by mouth 3 (three) times daily. 120 tablet 0  . Magnesium 400 MG TABS Take 1 tablet by mouth daily. (Patient taking differently: Take 400 mg by mouth at bedtime. ) 30 tablet PRN  . metFORMIN (GLUCOPHAGE-XR) 750 MG 24 hr tablet Take 750 mg by mouth daily with breakfast.    . mirtazapine (REMERON) 45 MG tablet Take 45 mg by mouth at bedtime.    Marland Kitchen omeprazole (PRILOSEC) 20 MG capsule Take 20 mg by mouth daily before breakfast.     . simvastatin (ZOCOR) 20 MG tablet Take 20 mg by mouth every evening.    . traZODone (DESYREL) 150 MG tablet Take 150 mg by mouth at bedtime.       Marland Kitchen albuterol  2.5 mg Nebulization Q6H  . aspirin EC  81 mg Oral Daily  . budesonide  2 mL Inhalation BID  . docusate sodium  100 mg Oral BID  . DULoxetine  60 mg Oral Daily  . enalapril  5 mg Oral Daily  . enoxaparin (LOVENOX) injection  40 mg Subcutaneous Q24H  . gabapentin  800 mg Oral TID  . guaiFENesin  600 mg Oral BID  . ipratropium  0.5 mg Nebulization Q6H  . levofloxacin (LEVAQUIN) IV  750 mg Intravenous Q24H  . magnesium oxide  400 mg Oral Daily  . methylPREDNISolone (SOLU-MEDROL) injection  60 mg Intravenous Q24H  . metoprolol tartrate  25 mg Oral BID  . mirtazapine  45 mg Oral QHS  . nicotine  21 mg Transdermal Daily  . nitroGLYCERIN  0.5 inch Topical 3 times per day  . pantoprazole  40 mg Oral Daily  . simvastatin  40 mg Oral Daily  . sodium chloride flush  3 mL Intravenous Q12H    Infusions:    Allergies  Allergen Reactions  . Ampicillin Hives and Rash    Has patient had a PCN reaction causing immediate rash, facial/tongue/throat swelling, SOB or lightheadedness with hypotension: Yes Has patient had a PCN reaction causing severe rash involving mucus membranes or skin necrosis: No Has patient had a PCN reaction that required hospitalization No Has patient had a PCN reaction occurring within the last 10 years: Yes If all of the above  answers are "NO", then may proceed with Cephalosporin use.  Marland Kitchen Penicillins Hives and Rash    Has patient had a PCN reaction causing immediate rash, facial/tongue/throat swelling, SOB or lightheadedness with hypotension: Yes Has patient had a PCN reaction causing severe rash involving mucus membranes or skin necrosis: No Has patient had a PCN reaction that required hospitalization No Has patient had a PCN reaction occurring within the last 10 years: Yes If all of the above answers are "NO", then may proceed with Cephalosporin use.    Social History   Social History  . Marital Status: Divorced    Spouse Name: N/A  . Number of Children: N/A  . Years of Education: N/A   Occupational History  . Not on file.   Social History Main Topics  . Smoking status: Current Every Day Smoker -- 0.50 packs/day for 45 years    Types: Cigarettes  . Smokeless tobacco: Not on file  . Alcohol Use: No  . Drug Use: Yes     Comment: "sometimes I smoke a blunt so I can go to sleep"  . Sexual Activity: Not on file   Other Topics Concern  . Not on file   Social History Narrative    Family History  Problem Relation Age of Onset  . Heart disease Mother     PHYSICAL EXAM: Filed Vitals:   06/26/15 0408 06/26/15 0807  BP: 113/66 121/71  Pulse: 76 89  Temp: 97.8 F (36.6 C)   Resp: 20      Intake/Output Summary (Last 24 hours) at 06/26/15 0959 Last data filed at 06/26/15 0841  Gross per 24 hour  Intake      0 ml  Output   1280 ml  Net  -1280 ml    General:  Well appearing. No respiratory difficulty HEENT: normal Neck: supple. no JVD. Carotids 2+ bilat; no bruits. No lymphadenopathy or thryomegaly appreciated. Cor: PMI nondisplaced. Regular rate & rhythm. No rubs, gallops or murmurs. Lungs: clear Abdomen: soft, nontender, nondistended. No hepatosplenomegaly. No bruits or masses. Good bowel sounds. Extremities: no cyanosis, clubbing, rash, edema Neuro: alert & oriented x 3, cranial nerves  grossly intact. moves all 4 extremities w/o difficulty. Affect pleasant.  ECG: NSR no acute changes  Results for orders placed or performed during the hospital encounter of 06/25/15 (from the past 24 hour(s))  Basic metabolic panel     Status: Abnormal   Collection Time: 06/25/15 12:41 PM  Result Value  Ref Range   Sodium 134 (L) 135 - 145 mmol/L   Potassium 4.1 3.5 - 5.1 mmol/L   Chloride 106 101 - 111 mmol/L   CO2 24 22 - 32 mmol/L   Glucose, Bld 93 65 - 99 mg/dL   BUN 15 6 - 20 mg/dL   Creatinine, Ser 0.90 0.44 - 1.00 mg/dL   Calcium 8.9 8.9 - 10.3 mg/dL   GFR calc non Af Amer >60 >60 mL/min   GFR calc Af Amer >60 >60 mL/min   Anion gap 4 (L) 5 - 15  CBC     Status: Abnormal   Collection Time: 06/25/15 12:41 PM  Result Value Ref Range   WBC 6.1 3.6 - 11.0 K/uL   RBC 4.81 3.80 - 5.20 MIL/uL   Hemoglobin 13.8 12.0 - 16.0 g/dL   HCT 41.4 35.0 - 47.0 %   MCV 86.0 80.0 - 100.0 fL   MCH 28.7 26.0 - 34.0 pg   MCHC 33.4 32.0 - 36.0 g/dL   RDW 14.9 (H) 11.5 - 14.5 %   Platelets 324 150 - 440 K/uL  Troponin I     Status: Abnormal   Collection Time: 06/25/15 12:41 PM  Result Value Ref Range   Troponin I 0.09 (H) <0.031 ng/mL  Brain natriuretic peptide     Status: None   Collection Time: 06/25/15 12:41 PM  Result Value Ref Range   B Natriuretic Peptide 27.0 0.0 - 100.0 pg/mL  Rapid Influenza A&B Antigens ([facility] only)     Status: None   Collection Time: 06/25/15  4:10 PM  Result Value Ref Range   Influenza A ([facility]) NEGATIVE NEGATIVE   Influenza B ([facility]) NEGATIVE NEGATIVE  Urinalysis complete, with microscopic ([facility] only)     Status: Abnormal   Collection Time: 06/25/15  4:19 PM  Result Value Ref Range   Color, Urine YELLOW (A) YELLOW   APPearance CLEAR (A) CLEAR   Glucose, UA NEGATIVE NEGATIVE mg/dL   Bilirubin Urine NEGATIVE NEGATIVE   Ketones, ur NEGATIVE NEGATIVE mg/dL   Specific Gravity, Urine 1.020 1.005 - 1.030   Hgb urine dipstick NEGATIVE NEGATIVE   pH 6.0 5.0 - 8.0    Protein, ur NEGATIVE NEGATIVE mg/dL   Nitrite NEGATIVE NEGATIVE   Leukocytes, UA NEGATIVE NEGATIVE   RBC / HPF NONE SEEN 0 - 5 RBC/hpf   WBC, UA 0-5 0 - 5 WBC/hpf   Bacteria, UA NONE SEEN NONE SEEN   Squamous Epithelial / LPF 0-5 (A) NONE SEEN   Mucous PRESENT   Urine culture     Status: None   Collection Time: 06/25/15  4:19 PM  Result Value Ref Range   Specimen Description URINE, RANDOM    Special Requests NONE    Culture INSIGNIFICANT GROWTH    Report Status 06/26/2015 FINAL   Hemoglobin A1c     Status: None   Collection Time: 06/25/15  6:49 PM  Result Value Ref Range   Hgb A1c MFr Bld 5.7 4.0 - 6.0 %  Troponin I     Status: Abnormal   Collection Time: 06/25/15  6:49 PM  Result Value Ref Range   Troponin I 0.11 (H) <0.031 ng/mL  Troponin I     Status: Abnormal   Collection Time: 06/26/15 12:40 AM  Result Value Ref Range   Troponin I 0.05 (H) <0.031 ng/mL  Comprehensive metabolic panel     Status: Abnormal   Collection Time: 06/26/15 12:40 AM  Result Value Ref Range   Sodium  136 135 - 145 mmol/L   Potassium 4.6 3.5 - 5.1 mmol/L   Chloride 107 101 - 111 mmol/L   CO2 23 22 - 32 mmol/L   Glucose, Bld 130 (H) 65 - 99 mg/dL   BUN 11 6 - 20 mg/dL   Creatinine, Ser 0.79 0.44 - 1.00 mg/dL   Calcium 8.9 8.9 - 10.3 mg/dL   Total Protein 6.9 6.5 - 8.1 g/dL   Albumin 3.3 (L) 3.5 - 5.0 g/dL   AST 20 15 - 41 U/L   ALT 15 14 - 54 U/L   Alkaline Phosphatase 93 38 - 126 U/L   Total Bilirubin 0.5 0.3 - 1.2 mg/dL   GFR calc non Af Amer >60 >60 mL/min   GFR calc Af Amer >60 >60 mL/min   Anion gap 6 5 - 15  Troponin I     Status: Abnormal   Collection Time: 06/26/15  6:26 AM  Result Value Ref Range   Troponin I 0.04 (H) <0.031 ng/mL   Dg Chest 2 View  06/25/2015  CLINICAL DATA:  Left-sided chest pain. EXAM: CHEST  2 VIEW COMPARISON:  April 03, 2012. FINDINGS: There is interval development of moderate left pleural effusion with probable underlying atelectasis or infiltrate. Right  lung is clear. No pneumothorax is noted. Bony thorax is unremarkable. IMPRESSION: Interval development of moderate left pleural effusion with probable underlying atelectasis or infiltrate. Electronically Signed   By: Marijo Conception, M.D.   On: 06/25/2015 13:26   Ct Chest Wo Contrast  06/25/2015  CLINICAL DATA:  Patient recently treated for pneumonia. Today has increasing shortness of breath with left left-sided chest pain and left effusion on chest x-ray. EXAM: CT CHEST WITHOUT CONTRAST TECHNIQUE: Multidetector CT imaging of the chest was performed following the standard protocol without IV contrast. COMPARISON:  Chest x-ray today and abdominal pelvic CT 07/18/2012. FINDINGS: Examination demonstrates a moderate size left pleural effusion with compressive atelectasis in the left base. Possible mild associated airspace density with nodularity over the posterior left upper lobe as cannot exclude infection. Right lung is clear. Airways are within normal. Heart is normal in size. There is calcified plaque over the left main and 3 vessel coronary arteries. Calcified plaque is present over the thoracic aorta. There is no significant mediastinal or axillary adenopathy. Remaining mediastinal structures are unremarkable. Images through the upper abdomen are unchanged. There are mild degenerate changes of the spine. IMPRESSION: Moderate size left pleural effusion with compressive atelectasis in the left base. Suggestion of associated airspace density with nodularity over the posterior left upper lobe which may be due to infection. Recommend follow-up CT 3-4 weeks. Atherosclerotic coronary artery disease. Electronically Signed   By: Marin Olp M.D.   On: 06/25/2015 17:40     ASSESSMENT AND PLAN: Atypical chest pins with mildly elevated troponins and normal EKG. Patient has still residual left pneumonia, and that's probably cause of chest pains. Advise continuing treatment of pneumonia and may go home with f/u office  tomorrow at [place]

## 2015-06-26 NOTE — Progress Notes (Signed)
[facility] [place] NAME: Charlene Williams    MR#:  [id number]  DATE OF BIRTH:  [birth year]  SUBJECTIVE:   Feeling better this am after thoracentesis  REVIEW OF SYSTEMS:    Review of Systems  Constitutional: Negative for fever, chills and malaise/fatigue.  HENT: Negative for ear discharge, ear pain, hearing loss, nosebleeds and sore throat.   Eyes: Negative for blurred vision and pain.  Respiratory: Negative for cough, hemoptysis, shortness of breath and wheezing.   Cardiovascular: Positive for chest pain (from PNA). Negative for palpitations and leg swelling.  Gastrointestinal: Negative for nausea, vomiting, abdominal pain, diarrhea and blood in stool.  Genitourinary: Negative for dysuria.  Musculoskeletal: Negative for back pain.  Neurological: Negative for dizziness, tremors, speech change, focal weakness, seizures and headaches.  Endo/Heme/Allergies: Does not bruise/bleed easily.  Psychiatric/Behavioral: Negative for depression, suicidal ideas and hallucinations.    Tolerating Diet: yes      DRUG ALLERGIES:   Allergies  Allergen Reactions  . Ampicillin Hives and Rash    Has patient had a PCN reaction causing immediate rash, facial/tongue/throat swelling, SOB or lightheadedness with hypotension: Yes Has patient had a PCN reaction causing severe rash involving mucus membranes or skin necrosis: No Has patient had a PCN reaction that required hospitalization No Has patient had a PCN reaction occurring within the last 10 years: Yes If all of the above answers are "NO", then may proceed with Cephalosporin use.  Marland Kitchen Penicillins Hives and Rash    Has patient had a PCN reaction causing immediate rash, facial/tongue/throat swelling, SOB or lightheadedness with hypotension: Yes Has patient had a PCN reaction causing severe rash involving mucus membranes or skin necrosis: No Has patient had a PCN reaction that required  hospitalization No Has patient had a PCN reaction occurring within the last 10 years: Yes If all of the above answers are "NO", then may proceed with Cephalosporin use.    VITALS:  Blood pressure 135/75, pulse 66, temperature 97.8 F (36.6 C), temperature source Oral, resp. rate 20, height '5\' 3"'$  (1.6 m), weight 74.571 kg (164 lb 6.4 oz), SpO2 98 %.  PHYSICAL EXAMINATION:   Physical Exam  Constitutional: She is oriented to person, place, and time and well-developed, well-nourished, and in no distress. No distress.  HENT:  Head: Normocephalic.  Eyes: No scleral icterus.  Neck: Normal range of motion. Neck supple. No JVD present. No tracheal deviation present.  Cardiovascular: Normal rate, regular rhythm and normal heart sounds.  Exam reveals no gallop and no friction rub.   No murmur heard. Pulmonary/Chest: Effort normal and breath sounds normal. No respiratory distress. She has no wheezes. She has no rales. She exhibits no tenderness.  Abdominal: Soft. Bowel sounds are normal. She exhibits no distension and no mass. There is no tenderness. There is no rebound and no guarding.  Musculoskeletal: Normal range of motion. She exhibits no edema.  Neurological: She is alert and oriented to person, place, and time.  Skin: Skin is warm. No rash noted. No erythema.  Psychiatric: Affect and judgment normal.      LABORATORY PANEL:   CBC  Recent Labs Lab 06/25/15 1241  WBC 6.1  HGB 13.8  HCT 41.4  PLT 324   ------------------------------------------------------------------------------------------------------------------  Chemistries   Recent Labs Lab 06/26/15 0040  NA 136  K 4.6  CL 107  CO2 23  GLUCOSE 130*  BUN 11  CREATININE 0.79  CALCIUM 8.9  AST 20  ALT 15  ALKPHOS 93  BILITOT 0.5   ------------------------------------------------------------------------------------------------------------------  Cardiac Enzymes  Recent Labs Lab 06/25/15 1849 06/26/15 0040  06/26/15 0626  TROPONINI 0.11* 0.05* 0.04*   ------------------------------------------------------------------------------------------------------------------  RADIOLOGY:  Dg Chest 1 View  06/26/2015  CLINICAL DATA:  Status post left thoracentesis. EXAM: CHEST 1 VIEW COMPARISON:  June 25, 2015. FINDINGS: Stable cardiomediastinal silhouette. Right lung is clear. No pneumothorax is noted. Left pleural effusion is significantly smaller status post thoracentesis. Left basilar atelectasis or infiltrate is noted. IMPRESSION: No pneumothorax status post left thoracentesis. Left pleural effusion is significantly smaller. Electronically Signed   By: Marijo Conception, M.D.   On: 06/26/2015 11:49   Dg Chest 2 View  06/25/2015  CLINICAL DATA:  Left-sided chest pain. EXAM: CHEST  2 VIEW COMPARISON:  April 03, 2012. FINDINGS: There is interval development of moderate left pleural effusion with probable underlying atelectasis or infiltrate. Right lung is clear. No pneumothorax is noted. Bony thorax is unremarkable. IMPRESSION: Interval development of moderate left pleural effusion with probable underlying atelectasis or infiltrate. Electronically Signed   By: Marijo Conception, M.D.   On: 06/25/2015 13:26   Ct Chest Wo Contrast  06/25/2015  CLINICAL DATA:  Patient recently treated for pneumonia. Today has increasing shortness of breath with left left-sided chest pain and left effusion on chest x-ray. EXAM: CT CHEST WITHOUT CONTRAST TECHNIQUE: Multidetector CT imaging of the chest was performed following the standard protocol without IV contrast. COMPARISON:  Chest x-ray today and abdominal pelvic CT 07/18/2012. FINDINGS: Examination demonstrates a moderate size left pleural effusion with compressive atelectasis in the left base. Possible mild associated airspace density with nodularity over the posterior left upper lobe as cannot exclude infection. Right lung is clear. Airways are within normal. Heart is normal in size.  There is calcified plaque over the left main and 3 vessel coronary arteries. Calcified plaque is present over the thoracic aorta. There is no significant mediastinal or axillary adenopathy. Remaining mediastinal structures are unremarkable. Images through the upper abdomen are unchanged. There are mild degenerate changes of the spine. IMPRESSION: Moderate size left pleural effusion with compressive atelectasis in the left base. Suggestion of associated airspace density with nodularity over the posterior left upper lobe which may be due to infection. Recommend follow-up CT 3-4 weeks. Atherosclerotic coronary artery disease. Electronically Signed   By: Marin Olp M.D.   On: 06/25/2015 17:40   US Thoracentesis Asp Pleural Space W/img Guide  06/26/2015  INDICATION: Left pleural effusion. EXAM: ULTRASOUND GUIDED left THORACENTESIS MEDICATIONS: None. COMPLICATIONS: None immediate. PROCEDURE: An ultrasound guided thoracentesis was thoroughly discussed with the patient and questions answered. The benefits, risks, alternatives and complications were also discussed. The patient understands and wishes to proceed with the procedure. Written consent was obtained. Ultrasound was performed to localize and mark an adequate pocket of fluid in the left chest. The area was then prepped and draped in the normal sterile fashion. 1% Lidocaine was used for local anesthesia. Under ultrasound guidance a Safe-T-Centesis catheter was introduced. Thoracentesis was performed. The catheter was removed and a dressing applied. FINDINGS: A total of approximately 1.5 L of bloody serous fluid was removed. Samples were sent to the laboratory as requested by the clinical team. IMPRESSION: Successful ultrasound guided left thoracentesis yielding 1.5 L of pleural fluid. Electronically Signed   By: Marijo Conception, M.D.   On: 06/26/2015 11:50     ASSESSMENT AND PLAN:  60 y.o. female with a known history of Hypertension, hyperlipidemia, diabetes  mellitus,  COPD, not on oxygen at home who presents to the hospital with complaints of about 1 month history of cough, phlegm production, feeling weak, tired, having fevers and chills and found to have left lower lobe pneumonia and left-sided pleural effusion.     1. CAP with left-sided pleural effusion: Patient underwent thoracentesis. Follow-up on results. Continue Levaquin  2. Essential hypertension: Continue enalapril.  3. COPD: Patient is without wheezing this morning. Wean steroids and continue nebulizer.  4. Diabetes: Continue ADA diet and add sliding scale insulin.   5. Depression: Continue Remeron.  6. Hyperlipidemia: Continue Zocor.   7. Elevated troponin with atypical chest pain and normal EKG: This is due to demand ischemia and not ACS.    patient can follow-up with cardiology once she is medical stable for discharge.  CP is due to PNA Management plans discussed with the patient and she is in agreement.  CODE STATUS: FULL  TOTAL TIME TAKING CARE OF THIS PATIENT: 30 minutes.     POSSIBLE D/C tomorrow, DEPENDING ON CLINICAL CONDITION.   Faten Frieson M.D on 06/26/2015 at 11:59 AM  Between 7am to 6pm - Pager - [phone] After 6pm go to [email or web address] - password [email or web address]  [facility]  Office  [phone]  CC: Primary care physician; Kasandra Knudsen, NP  Note: This dictation was prepared with Dragon dictation along with smaller phrase technology. Any transcriptional errors that result from this process are unintentional.

## 2015-06-26 NOTE — Evaluation (Addendum)
Physical Therapy Evaluation Patient Details Name: Charlene Williams MRN: [id number] DOB: [birth year] Today's Date: 06/26/2015   History of Present Illness  60 yo F presented to ed for chest pain and back pain. She was recently treated for pneumonia in outpatient. She was found to have SIRS, pleural effusion and CAP. PMH includes DM, HTN, Aneurysm, COPD, illicit drug use, chronic pain, and seizure disorder.  Clinical Impression  Pt demonstrated generalized weakness and difficulty walking dur to recent illness and decreased activity. She requires min guard for transfers and ambulation with SPC. Mod I for bed mobility. Pt had 1 LOB with initial ambulation with self correction. Cues given for increased BOS. Pt demonstrated SOB but maintained SpO2 at 98% on 2L and HR 73 bpm. HHPT recommended to address pt's activity tolerance, strength, balance and gait deficits as well as assess home safety. Pt will benefit from skilled PT services to increase functional I and mobility for safe discharge.     Follow Up Recommendations Home health PT    Equipment Recommendations  Cane;Other (comment) (Single point cane) Pt reports her QC is old and she often trips on the pegs.   Recommendations for Other Services       Precautions / Restrictions Precautions Precautions: Fall Restrictions Weight Bearing Restrictions: No      Mobility  Bed Mobility Overal bed mobility: Modified Independent                Transfers   Equipment used: Straight cane             General transfer comment: cues for hand placement  Ambulation/Gait Ambulation/Gait assistance: Min guard Ambulation Distance (Feet): 75 Feet Assistive device: Straight cane Gait Pattern/deviations: Decreased stride length;Narrow base of support Gait velocity: reduced Gait velocity interpretation: Below normal speed for age/gender General Gait Details: Demonstrated generally steady gait with 1 LOB with self correction initially. Cues  for increased BOS.  Stairs            Wheelchair Mobility    Modified Rankin (Stroke Patients Only)       Balance                                             Pertinent Vitals/Pain Pain Assessment: 0-10 Pain Score: 7  Pain Location: L thorax Pain Descriptors / Indicators: Aching Pain Intervention(s): Limited activity within patient's tolerance;Monitored during session    Home Living Family/patient expects to be discharged to:: Private residence Living Arrangements: Children Available Help at Discharge: Family Type of Home: House Home Access: Stairs to enter Entrance Stairs-Rails:  (Pt stated she has one but 'its no good") Entrance Stairs-Number of Steps: 5 Home Layout: One level Home Equipment: Cane - quad Additional Comments: Grandchildren (10 and 12) live with her. Daughter lives in [place] and helps often.    Prior Function Level of Independence: Independent with assistive device(s)         Comments: Pt I with ADLs, ambulation with SPC, occasionally driving, taking care of grandchildren.      Hand Dominance        Extremity/Trunk Assessment   Upper Extremity Assessment: Generalized weakness           Lower Extremity Assessment: Generalized weakness         Communication   Communication: No difficulties  Cognition Arousal/Alertness: Awake/alert Behavior During Therapy: WFL for tasks assessed/performed Overall Cognitive  Status: Within Functional Limits for tasks assessed                      General Comments      Exercises Other Exercises Other Exercises: Pt ambulated 75 ft and 150 ft with SPC and min guard. Cues provided for increased BOS. SpO2 98% on 2L and HR 73 afterwards.      Assessment/Plan    PT Assessment Patient needs continued PT services  PT Diagnosis Difficulty walking;Generalized weakness   PT Problem List Decreased strength;Decreased activity tolerance;Decreased balance;Decreased  mobility  PT Treatment Interventions Gait training;Stair training;Therapeutic activities;Therapeutic exercise;Balance training;Neuromuscular re-education;Patient/family education   PT Goals (Current goals can be found in the Care Plan section) Acute Rehab PT Goals Patient Stated Goal: To take care of myself PT Goal Formulation: With patient Time For Goal Achievement: 07/10/15 Potential to Achieve Goals: Fair    Frequency Min 2X/week   Barriers to discharge Inaccessible home environment;Decreased caregiver support steps to enter, caregiver for grandchildren    Co-evaluation               End of Session Equipment Utilized During Treatment: Gait belt;Oxygen Activity Tolerance: Patient tolerated treatment well Patient left: in chair;with call bell/phone within reach;with chair alarm set Nurse Communication: Mobility status         Time: 5520-8022 PT Time Calculation (min) (ACUTE ONLY): 27 min   Charges:   PT Evaluation $PT Eval Moderate Complexity: 1 Procedure PT Treatments $Gait Training: 8-22 mins   PT G Codes:        Neoma Laming, PT, DPT  06/26/2015, 4:37 PM [phone]

## 2015-06-26 NOTE — Care Management Note (Signed)
Case Management Note  Patient Details  Name: SHERL YZAGUIRRE MRN: [id number] Date of Birth: 09-Apr-1955  Subjective/Objective:  CM consult for discharge planning. Met with patient at bedside. She was recently treated for PNA and failed out patient treatment. She is in the process of getting started with PCS services through [facility]. No home health services currently. She uses a cane. She lives at home with her two grandchildren ages 4 and 24. She states her daughter transports her to appointments and other needs. She is disabled due to back problems. It is noted that patient was positive for marijuana and opiates. She is open to home health services with no agency preference. PT pending. She states MD told her possible DC today. Will follow up following PT evaluation.                   Action/Plan:   Expected Discharge Date:                  Expected Discharge Plan:  [place]  In-House Referral:     Discharge planning Services  CM Consult  Post Acute Care Choice:  Home Health Choice offered to:  Patient  DME Arranged:    DME Agency:     HH Arranged:    [place] Agency:     Status of Service:  In process, will continue to follow  Medicare Important Message Given:    Date Medicare IM Given:    Medicare IM give by:    Date Additional Medicare IM Given:    Additional Medicare Important Message give by:     If discussed at [place] of Stay Meetings, dates discussed:    Additional Comments:  Jolly Mango, RN 06/26/2015, 10:06 AM

## 2015-06-26 NOTE — Progress Notes (Signed)
PT Cancellation Note  Patient Details Name: Charlene Williams MRN: [id number] DOB: 1956/03/02   Cancelled Treatment:    Reason Eval/Treat Not Completed: Patient at procedure or test/unavailable. Pt's chart reviewed and discussed with RN. Pt currently out of room for a thoracentesis. PT will f/u and complete evaluation at a later time.   Neoma Laming, PT, DPT  06/26/2015, 11:45 AM [phone]

## 2015-06-26 NOTE — Progress Notes (Signed)
Per Dr. Humphrey Rolls d/c nitro paste at this time. Wilnette Kales

## 2015-06-26 NOTE — Procedures (Signed)
Under US guidance, 1500 mls of bloody serous fluid removed from left hemithorax. Sample sent to lab.

## 2015-06-26 NOTE — Progress Notes (Addendum)
Patient back from [place] post thoracentesis. VSS. C/o of pain 7/10. Pain medication given, patient resting at this time. Site dry, clean, and intact. Wilnette Kales

## 2015-06-27 ENCOUNTER — Inpatient Hospital Stay: Payer: Medicare Other

## 2015-06-27 ENCOUNTER — Inpatient Hospital Stay: Admit: 2015-06-27 | Payer: Medicare Other

## 2015-06-27 MED ORDER — LEVOFLOXACIN 750 MG PO TABS: 750.0000 mg | ORAL_TABLET | ORAL | Status: DC

## 2015-06-27 MED ORDER — NICOTINE 21 MG/24HR TD PT24: 21.0000 mg | MEDICATED_PATCH | Freq: Every day | TRANSDERMAL | Status: DC

## 2015-06-27 MED ORDER — METOPROLOL TARTRATE 25 MG PO TABS: 25.0000 mg | ORAL_TABLET | Freq: Two times a day (BID) | ORAL | Status: AC

## 2015-06-27 NOTE — Plan of Care (Signed)
Problem: Pain Managment: Goal: General experience of comfort will improve Outcome: Progressing Pain relief with prn meds as requested.

## 2015-06-27 NOTE — Progress Notes (Signed)
SUBJECTIVE: Patient doing much better  Filed Vitals:   06/26/15 1944 06/26/15 2039 06/27/15 0238 06/27/15 0919  BP: 133/70   121/75  Pulse: 71   85  Temp: 98.6 F (37 C)     TempSrc: Oral     Resp: 22     Height:      Weight:      SpO2: 94% 94% 94%     Intake/Output Summary (Last 24 hours) at 06/27/15 1133 Last data filed at 06/27/15 0957  Gross per 24 hour  Intake    630 ml  Output    250 ml  Net    380 ml    LABS: Basic Metabolic Panel:  Recent Labs  06/25/15 1241 06/26/15 0040  NA 134* 136  K 4.1 4.6  CL 106 107  CO2 24 23  GLUCOSE 93 130*  BUN 15 11  CREATININE 0.90 0.79  CALCIUM 8.9 8.9   Liver Function Tests:  Recent Labs  06/26/15 0040  AST 20  ALT 15  ALKPHOS 93  BILITOT 0.5  PROT 6.9  ALBUMIN 3.3*   No results for input(s): LIPASE, AMYLASE in the last 72 hours. CBC:  Recent Labs  06/25/15 1241  WBC 6.1  HGB 13.8  HCT 41.4  MCV 86.0  PLT 324   Cardiac Enzymes:  Recent Labs  06/25/15 1849 06/26/15 0040 06/26/15 0626  TROPONINI 0.11* 0.05* 0.04*   BNP: Invalid input(s): POCBNP D-Dimer: No results for input(s): DDIMER in the last 72 hours. Hemoglobin A1C:  Recent Labs  06/25/15 1849  HGBA1C 5.7   Fasting Lipid Panel: No results for input(s): CHOL, HDL, LDLCALC, TRIG, CHOLHDL, LDLDIRECT in the last 72 hours. Thyroid Function Tests: No results for input(s): TSH, T4TOTAL, T3FREE, THYROIDAB in the last 72 hours.  Invalid input(s): FREET3 Anemia Panel: No results for input(s): VITAMINB12, FOLATE, FERRITIN, TIBC, IRON, RETICCTPCT in the last 72 hours.   PHYSICAL EXAM General: Well developed, well nourished, in no acute distress HEENT:  Normocephalic and atramatic Neck:  No JVD.  Lungs: Clear bilaterally to auscultation and percussion. Heart: HRRR . Normal S1 and S2 without gallops or murmurs.  Abdomen: Bowel sounds are positive, abdomen soft and non-tender  Msk:  Back normal, normal gait. Normal strength and tone for  age. Extremities: No clubbing, cyanosis or edema.   Neuro: Alert and oriented X 3. Psych:  Good affect, responds appropriately  TELEMETRY:NSR  ASSESSMENT AND PLAN: Atypical chest pains and mildly elevated troponin, will do out patient stress test once discharged. Will see her Thursday 2 pm.  Active Problems:   SIRS (systemic inflammatory response syndrome) (HCC)   Pleural effusion, left   Left lower lobe pneumonia   Hypoxemia   Hyponatremia   Elevated troponin   Generalized weakness    Burney Calzadilla A, MD, [facility] 06/27/2015 11:33 AM

## 2015-06-27 NOTE — Progress Notes (Signed)
Antibiotic IV to Oral Route Change Policy  RECOMMENDATION: This patient is receiving levofloxacin by the intravenous route.  Based on criteria approved by the Pharmacy and Therapeutics Committee, the antibiotic(s) is/are being converted to the equivalent oral dose form(s).   DESCRIPTION: These criteria include:  Patient being treated for a respiratory tract infection, urinary tract infection, cellulitis or clostridium difficile associated diarrhea if on metronidazole  The patient is not neutropenic and does not exhibit a GI malabsorption state  The patient is eating (either orally or via tube) and/or has been taking other orally administered medications for a least 24 hours  The patient is improving clinically and has a Tmax < 100.5  If you have questions about this conversion, please contact the [place], PharmD Clinical Pharmacist  06/27/2015 11:27 AM

## 2015-06-27 NOTE — Progress Notes (Signed)
Patient d/c'd home. Education provided by charge nurse Carlyle Dolly, RN. No questions at this time. Patient picked up by daughter. Telemetry removed. Wilnette Kales

## 2015-06-27 NOTE — Care Management (Signed)
For discharge today with home health SN PT Aide and social work.  Patient is requiring assist with initiating medicaid pcs .  No gency preference.  Referral to [facility] and requested home health orders from attending.  Patient says she is current with her pcp

## 2015-06-27 NOTE — Discharge Summary (Signed)
[facility] [place] NAME: Charlene Williams    MR#:  [id number]  DATE OF BIRTH:  27-Jun-1955  DATE OF ADMISSION:  06/25/2015 ADMITTING PHYSICIAN: Theodoro Grist, MD  DATE OF DISCHARGE: 06/27/2015  PRIMARY CARE PHYSICIAN: HOLLAND, [place], NP    ADMISSION DIAGNOSIS:  Pleural effusion [J90] CAP (community acquired pneumonia) [J18.9]  DISCHARGE DIAGNOSIS:  Active Problems:   SIRS (systemic inflammatory response syndrome) (HCC)   Pleural effusion, left   Left lower lobe pneumonia   Hypoxemia   Hyponatremia   Elevated troponin   Generalized weakness   SECONDARY DIAGNOSIS:   Past Medical History  Diagnosis Date  . Diabetes mellitus without complication ([place])   . Sleep apnea   . Hypertension   . Aneurysm ([place])   . Hiatal hernia   . COPD (chronic obstructive pulmonary disease) ([place])   . Illicit drug use   . Migraines   . Hypercholesteremia   . Depression   . Spine disorder   . Family history of chronic pain 01/17/2015    HOSPITAL COURSE:    60 y.o. female with a known history of Hypertension, hyperlipidemia, diabetes mellitus, COPD, not on oxygen at home who presents to the hospital with complaints of about 1 month history of cough, phlegm production, feeling weak, tired, having fevers and chills and found to have left lower lobe pneumonia and left-sided pleural effusion.     1. CAP with left-sided pleural effusion: Patient underwent thoracentesis which is consistent with effusion due to pneumonia.Marland Kitchen She will continue Levaquin. She will need a follow-up chest x-ray in 3-4 weeks.  2. Essential hypertension: Continue enalapril and metoprolol.  3. COPD: She was initially started on IV steroids. She has no wheezing. I do not feel the patient has an acute exacerbation. She will not be discharged on steroids.  4. Diabetes: Continue ADA diet and outpatient regimen. She will need follow-up with her primary care physician.   5.  Depression: Continue Remeron.  6. Hyperlipidemia: Continue Zocor.   7. Elevated troponin with atypical chest pain and normal EKG: This is due to demand ischemia and not ACS.  She will follow-up with cardiology on Thursday for stress test. Chest pain is due to the pleural effusion and pneumonia.   8. Tobacco dependence: Patient is highly encouraged to stop smoking. She will be discharged with nicotine patch. Patient was counseled for 3 minutes. DISCHARGE CONDITIONS AND DIET:   SHe is in stable for discharge on a heart healthy diet  CONSULTS OBTAINED:  Treatment Team:  Dionisio David, MD  DRUG ALLERGIES:   Allergies  Allergen Reactions  . Ampicillin Hives and Rash    Has patient had a PCN reaction causing immediate rash, facial/tongue/throat swelling, SOB or lightheadedness with hypotension: Yes Has patient had a PCN reaction causing severe rash involving mucus membranes or skin necrosis: No Has patient had a PCN reaction that required hospitalization No Has patient had a PCN reaction occurring within the last 10 years: Yes If all of the above answers are "NO", then may proceed with Cephalosporin use.  Marland Kitchen Penicillins Hives and Rash    Has patient had a PCN reaction causing immediate rash, facial/tongue/throat swelling, SOB or lightheadedness with hypotension: Yes Has patient had a PCN reaction causing severe rash involving mucus membranes or skin necrosis: No Has patient had a PCN reaction that required hospitalization No Has patient had a PCN reaction occurring within the last 10 years: Yes If all of the above  answers are "NO", then may proceed with Cephalosporin use.    DISCHARGE MEDICATIONS:   Current Discharge Medication List    START taking these medications   Details  levofloxacin (LEVAQUIN) 750 MG tablet Take 1 tablet (750 mg total) by mouth daily. Qty: 6 tablet, Refills: 0    metoprolol tartrate (LOPRESSOR) 25 MG tablet Take 1 tablet (25 mg total) by mouth 2 (two)  times daily. Qty: 60 tablet, Refills: 0    nicotine (NICODERM CQ - DOSED IN MG/24 HOURS) 21 mg/24hr patch Place 1 patch (21 mg total) onto the skin daily. Qty: 28 patch, Refills: 0      CONTINUE these medications which have NOT CHANGED   Details  alendronate (FOSAMAX) 70 MG tablet Take 70 mg by mouth once a week. Take with a full glass of water on an empty stomach. *take on Tuesday*    aspirin EC 81 MG tablet Take 81 mg by mouth daily.    cetirizine (ZYRTEC) 10 MG tablet Take 10 mg by mouth at bedtime.    DULoxetine (CYMBALTA) 30 MG capsule Take 90 mg by mouth every morning.    enalapril (VASOTEC) 10 MG tablet Take 10 mg by mouth every morning.    fluticasone (FLOVENT HFA) 220 MCG/ACT inhaler Inhale 1 puff into the lungs 2 (two) times daily.    gabapentin (NEURONTIN) 800 MG tablet Take 1 tablet (800 mg total) by mouth 3 (three) times daily. Qty: 120 tablet, Refills: 0   Associated Diagnoses: Neurogenic pain    Magnesium 400 MG TABS Take 1 tablet by mouth daily. Qty: 30 tablet, Refills: PRN   Associated Diagnoses: Myofascial pain    metFORMIN (GLUCOPHAGE-XR) 750 MG 24 hr tablet Take 750 mg by mouth daily with breakfast.    mirtazapine (REMERON) 45 MG tablet Take 45 mg by mouth at bedtime.    omeprazole (PRILOSEC) 20 MG capsule Take 20 mg by mouth daily before breakfast.     simvastatin (ZOCOR) 20 MG tablet Take 20 mg by mouth every evening.    traZODone (DESYREL) 150 MG tablet Take 150 mg by mouth at bedtime.      STOP taking these medications     aspirin 81 MG tablet               Today   CHIEF COMPLAINT:  Patient doing fairly well this morning. Chest pain is resolved. She has no shortness of breath or wheezing.   VITAL SIGNS:  Blood pressure 136/77, pulse 77, temperature 97.4 F (36.3 C), temperature source Oral, resp. rate 18, height '5\' 3"'$  (1.6 m), weight 74.571 kg (164 lb 6.4 oz), SpO2 94 %.   REVIEW OF SYSTEMS:  Review of Systems  Constitutional:  Negative for fever, chills and malaise/fatigue.  HENT: Negative for ear discharge, ear pain, hearing loss, nosebleeds and sore throat.   Eyes: Negative for blurred vision and pain.  Respiratory: Negative for cough, hemoptysis, shortness of breath and wheezing.   Cardiovascular: Negative for chest pain, palpitations and leg swelling.  Gastrointestinal: Negative for nausea, vomiting, abdominal pain, diarrhea and blood in stool.  Genitourinary: Negative for dysuria.  Musculoskeletal: Negative for back pain.  Neurological: Negative for dizziness, tremors, speech change, focal weakness, seizures and headaches.  Endo/Heme/Allergies: Does not bruise/bleed easily.  Psychiatric/Behavioral: Negative for depression, suicidal ideas and hallucinations.     PHYSICAL EXAMINATION:  GENERAL:  60 y.o.-year-old patient lying in the bed with no acute distress.  NECK:  Supple, no jugular venous distention. No thyroid enlargement, no  tenderness.  LUNGS: Crackles left lung base no wheezing, rales,rhonchi  No use of accessory muscles of respiration.  CARDIOVASCULAR: S1, S2 normal. No murmurs, rubs, or gallops.  ABDOMEN: Soft, non-tender, non-distended. Bowel sounds present. No organomegaly or mass.  EXTREMITIES: No pedal edema, cyanosis, or clubbing.  PSYCHIATRIC: The patient is alert and oriented x 3.  SKIN: No obvious rash, lesion, or ulcer.   DATA REVIEW:   CBC  Recent Labs Lab 06/25/15 1241  WBC 6.1  HGB 13.8  HCT 41.4  PLT 324    Chemistries   Recent Labs Lab 06/26/15 0040  NA 136  K 4.6  CL 107  CO2 23  GLUCOSE 130*  BUN 11  CREATININE 0.79  CALCIUM 8.9  AST 20  ALT 15  ALKPHOS 93  BILITOT 0.5    Cardiac Enzymes  Recent Labs Lab 06/25/15 1849 06/26/15 0040 06/26/15 0626  TROPONINI 0.11* 0.05* 0.04*    Microbiology Results  '@[id number]'$ @  RADIOLOGY:  Dg Chest 1 View  06/27/2015  CLINICAL DATA:  Recent history of pneumonia, followup exam EXAM: CHEST 1 VIEW  COMPARISON:  06/26/2015 FINDINGS: Persistent left basilar changes are noted with infiltrate and pleural effusion. The cardiac shadow is stable. Right lung remains clear. No acute bony abnormality is noted. IMPRESSION: Persistent changes in the left lung base. Electronically Signed   By: Inez Catalina M.D.   On: 06/27/2015 09:51   Dg Chest 1 View  06/26/2015  CLINICAL DATA:  Status post left thoracentesis. EXAM: CHEST 1 VIEW COMPARISON:  June 25, 2015. FINDINGS: Stable cardiomediastinal silhouette. Right lung is clear. No pneumothorax is noted. Left pleural effusion is significantly smaller status post thoracentesis. Left basilar atelectasis or infiltrate is noted. IMPRESSION: No pneumothorax status post left thoracentesis. Left pleural effusion is significantly smaller. Electronically Signed   By: Marijo Conception, M.D.   On: 06/26/2015 11:49   Dg Chest 2 View  06/25/2015  CLINICAL DATA:  Left-sided chest pain. EXAM: CHEST  2 VIEW COMPARISON:  April 03, 2012. FINDINGS: There is interval development of moderate left pleural effusion with probable underlying atelectasis or infiltrate. Right lung is clear. No pneumothorax is noted. Bony thorax is unremarkable. IMPRESSION: Interval development of moderate left pleural effusion with probable underlying atelectasis or infiltrate. Electronically Signed   By: Marijo Conception, M.D.   On: 06/25/2015 13:26   Ct Chest Wo Contrast  06/25/2015  CLINICAL DATA:  Patient recently treated for pneumonia. Today has increasing shortness of breath with left left-sided chest pain and left effusion on chest x-ray. EXAM: CT CHEST WITHOUT CONTRAST TECHNIQUE: Multidetector CT imaging of the chest was performed following the standard protocol without IV contrast. COMPARISON:  Chest x-ray today and abdominal pelvic CT 07/18/2012. FINDINGS: Examination demonstrates a moderate size left pleural effusion with compressive atelectasis in the left base. Possible mild associated airspace density  with nodularity over the posterior left upper lobe as cannot exclude infection. Right lung is clear. Airways are within normal. Heart is normal in size. There is calcified plaque over the left main and 3 vessel coronary arteries. Calcified plaque is present over the thoracic aorta. There is no significant mediastinal or axillary adenopathy. Remaining mediastinal structures are unremarkable. Images through the upper abdomen are unchanged. There are mild degenerate changes of the spine. IMPRESSION: Moderate size left pleural effusion with compressive atelectasis in the left base. Suggestion of associated airspace density with nodularity over the posterior left upper lobe which may be due to infection. Recommend follow-up CT  3-4 weeks. Atherosclerotic coronary artery disease. Electronically Signed   By: Marin Olp M.D.   On: 06/25/2015 17:40   US Thoracentesis Asp Pleural Space W/img Guide  06/26/2015  INDICATION: Left pleural effusion. EXAM: ULTRASOUND GUIDED left THORACENTESIS MEDICATIONS: None. COMPLICATIONS: None immediate. PROCEDURE: An ultrasound guided thoracentesis was thoroughly discussed with the patient and questions answered. The benefits, risks, alternatives and complications were also discussed. The patient understands and wishes to proceed with the procedure. Written consent was obtained. Ultrasound was performed to localize and mark an adequate pocket of fluid in the left chest. The area was then prepped and draped in the normal sterile fashion. 1% Lidocaine was used for local anesthesia. Under ultrasound guidance a Safe-T-Centesis catheter was introduced. Thoracentesis was performed. The catheter was removed and a dressing applied. FINDINGS: A total of approximately 1.5 L of bloody serous fluid was removed. Samples were sent to the laboratory as requested by the clinical team. IMPRESSION: Successful ultrasound guided left thoracentesis yielding 1.5 L of pleural fluid. Electronically Signed   By:  Marijo Conception, M.D.   On: 06/26/2015 11:50      Management plans discussed with the patient and she4 is in agreement. Stable for discharge home  Patient should follow up with Dr Humphrey Rolls Thursday 2:00 in PCP in one week  CODE STATUS:     Code Status Orders        Start     Ordered   06/25/15 1836  Full code   Continuous     06/25/15 1835    Code Status History    Date Active Date Inactive Code Status Order ID Comments User Context   This patient has a current code status but no historical code status.      TOTAL TIME TAKING CARE OF THIS PATIENT: 35 minutes.    Note: This dictation was prepared with Dragon dictation along with smaller phrase technology. Any transcriptional errors that result from this process are unintentional.  Tameshia Bonneville M.D on 06/27/2015 at 11:47 AM  Between 7am to 6pm - Pager - [phone] After 6pm go to [email or web address] - password [email or web address] [facility]  [facility]  Office  [phone]  CC: Primary care physician; Kasandra Knudsen, NP

## 2015-06-27 NOTE — Plan of Care (Signed)
Problem: Activity: Goal: Risk for activity intolerance will decrease Outcome: Progressing Tolerating activity in room without distress.  No syncopal episodes noted.

## 2015-06-27 NOTE — Care Management Important Message (Signed)
Important Message  Patient Details  Name: Charlene Williams MRN: [id number] Date of Birth: February 20, 1956   Medicare Important Message Given:  Yes    Juliann Pulse A Adellyn Capek 06/27/2015, 9:58 AM

## 2015-06-30 LAB — CULTURE, BLOOD (ROUTINE X 2)
CULTURE: NO GROWTH
Culture: NO GROWTH

## 2015-06-30 LAB — CYTOLOGY - NON PAP

## 2015-07-01 LAB — CULTURE, BODY FLUID W GRAM STAIN -BOTTLE: Culture: NO GROWTH

## 2015-07-01 LAB — CULTURE, BODY FLUID-BOTTLE

## 2015-07-04 LAB — PH, BODY FLUID: pH, Body Fluid: 7.9

## 2015-07-05 ENCOUNTER — Ambulatory Visit: Payer: Medicare Other | Attending: Nurse Practitioner

## 2015-07-11 ENCOUNTER — Inpatient Hospital Stay: Payer: Medicare Other

## 2015-07-11 ENCOUNTER — Inpatient Hospital Stay
Admission: AD | Admit: 2015-07-11 | Discharge: 2015-07-19 | DRG: 166 | Disposition: A | Discharge status: Home Health | Payer: Medicare Other | Source: Ambulatory Visit | Attending: Specialist | Admitting: Specialist

## 2015-07-11 DIAGNOSIS — R05 Cough: Secondary | ICD-10-CM

## 2015-07-11 DIAGNOSIS — Z7982 Long term (current) use of aspirin: Secondary | ICD-10-CM | POA: Diagnosis not present

## 2015-07-11 DIAGNOSIS — J9621 Acute and chronic respiratory failure with hypoxia: Secondary | ICD-10-CM | POA: Diagnosis present

## 2015-07-11 DIAGNOSIS — K219 Gastro-esophageal reflux disease without esophagitis: Secondary | ICD-10-CM | POA: Diagnosis present

## 2015-07-11 DIAGNOSIS — J95811 Postprocedural pneumothorax: Secondary | ICD-10-CM | POA: Diagnosis not present

## 2015-07-11 DIAGNOSIS — M1990 Unspecified osteoarthritis, unspecified site: Secondary | ICD-10-CM | POA: Diagnosis present

## 2015-07-11 DIAGNOSIS — E119 Type 2 diabetes mellitus without complications: Secondary | ICD-10-CM

## 2015-07-11 DIAGNOSIS — Z880 Allergy status to penicillin: Secondary | ICD-10-CM | POA: Diagnosis not present

## 2015-07-11 DIAGNOSIS — G4733 Obstructive sleep apnea (adult) (pediatric): Secondary | ICD-10-CM | POA: Diagnosis present

## 2015-07-11 DIAGNOSIS — J189 Pneumonia, unspecified organism: Secondary | ICD-10-CM | POA: Diagnosis present

## 2015-07-11 DIAGNOSIS — F17210 Nicotine dependence, cigarettes, uncomplicated: Secondary | ICD-10-CM | POA: Diagnosis not present

## 2015-07-11 DIAGNOSIS — E785 Hyperlipidemia, unspecified: Secondary | ICD-10-CM | POA: Diagnosis present

## 2015-07-11 DIAGNOSIS — I10 Essential (primary) hypertension: Secondary | ICD-10-CM | POA: Diagnosis present

## 2015-07-11 DIAGNOSIS — E7800 Pure hypercholesterolemia, unspecified: Secondary | ICD-10-CM | POA: Diagnosis present

## 2015-07-11 DIAGNOSIS — Z7984 Long term (current) use of oral hypoglycemic drugs: Secondary | ICD-10-CM

## 2015-07-11 DIAGNOSIS — C801 Malignant (primary) neoplasm, unspecified: Secondary | ICD-10-CM | POA: Diagnosis not present

## 2015-07-11 DIAGNOSIS — J90 Pleural effusion, not elsewhere classified: Secondary | ICD-10-CM | POA: Diagnosis not present

## 2015-07-11 DIAGNOSIS — J939 Pneumothorax, unspecified: Secondary | ICD-10-CM

## 2015-07-11 DIAGNOSIS — Z8249 Family history of ischemic heart disease and other diseases of the circulatory system: Secondary | ICD-10-CM | POA: Diagnosis not present

## 2015-07-11 DIAGNOSIS — Z87891 Personal history of nicotine dependence: Secondary | ICD-10-CM | POA: Diagnosis not present

## 2015-07-11 DIAGNOSIS — C3432 Malignant neoplasm of lower lobe, left bronchus or lung: Principal | ICD-10-CM | POA: Diagnosis present

## 2015-07-11 DIAGNOSIS — F4320 Adjustment disorder, unspecified: Secondary | ICD-10-CM | POA: Diagnosis present

## 2015-07-11 DIAGNOSIS — R059 Cough, unspecified: Secondary | ICD-10-CM

## 2015-07-11 DIAGNOSIS — R0600 Dyspnea, unspecified: Secondary | ICD-10-CM | POA: Diagnosis not present

## 2015-07-11 DIAGNOSIS — C3492 Malignant neoplasm of unspecified part of left bronchus or lung: Secondary | ICD-10-CM

## 2015-07-11 DIAGNOSIS — F4322 Adjustment disorder with anxiety: Secondary | ICD-10-CM

## 2015-07-11 DIAGNOSIS — Z09 Encounter for follow-up examination after completed treatment for conditions other than malignant neoplasm: Secondary | ICD-10-CM

## 2015-07-11 DIAGNOSIS — J440 Chronic obstructive pulmonary disease with acute lower respiratory infection: Secondary | ICD-10-CM | POA: Diagnosis present

## 2015-07-11 DIAGNOSIS — J441 Chronic obstructive pulmonary disease with (acute) exacerbation: Secondary | ICD-10-CM | POA: Diagnosis present

## 2015-07-11 DIAGNOSIS — Z9889 Other specified postprocedural states: Secondary | ICD-10-CM

## 2015-07-11 DIAGNOSIS — F419 Anxiety disorder, unspecified: Secondary | ICD-10-CM | POA: Diagnosis present

## 2015-07-11 DIAGNOSIS — J449 Chronic obstructive pulmonary disease, unspecified: Secondary | ICD-10-CM | POA: Diagnosis present

## 2015-07-11 DIAGNOSIS — F329 Major depressive disorder, single episode, unspecified: Secondary | ICD-10-CM | POA: Diagnosis present

## 2015-07-11 DIAGNOSIS — J910 Malignant pleural effusion: Secondary | ICD-10-CM | POA: Diagnosis present

## 2015-07-11 DIAGNOSIS — Z79899 Other long term (current) drug therapy: Secondary | ICD-10-CM

## 2015-07-11 DIAGNOSIS — C782 Secondary malignant neoplasm of pleura: Secondary | ICD-10-CM | POA: Diagnosis not present

## 2015-07-11 LAB — BASIC METABOLIC PANEL
Anion gap: 8 (ref 5–15)
BUN: 11 mg/dL (ref 6–20)
CHLORIDE: 106 mmol/L (ref 101–111)
CO2: 25 mmol/L (ref 22–32)
CREATININE: 0.88 mg/dL (ref 0.44–1.00)
Calcium: 9.3 mg/dL (ref 8.9–10.3)
GFR calc non Af Amer: >60 mL/min (ref >60)
Glucose, Bld: 97 mg/dL (ref 65–99)
Potassium: 4.1 mmol/L (ref 3.5–5.1)
Sodium: 139 mmol/L (ref 135–145)

## 2015-07-11 LAB — CBC
HEMATOCRIT: 37.7 % (ref 35.0–47.0)
HEMOGLOBIN: 12.7 g/dL (ref 12.0–16.0)
MCH: 28.3 pg (ref 26.0–34.0)
MCHC: 33.8 g/dL (ref 32.0–36.0)
MCV: 83.9 fL (ref 80.0–100.0)
Platelets: 430 10*3/uL (ref 150–440)
RBC: 4.5 MIL/uL (ref 3.80–5.20)
RDW: 14.8 % — ABNORMAL HIGH (ref 11.5–14.5)
WBC: 7.4 10*3/uL (ref 3.6–11.0)

## 2015-07-11 LAB — GLUCOSE, CAPILLARY: GLUCOSE-CAPILLARY: 153 mg/dL — AB (ref 65–99)

## 2015-07-11 MED ORDER — DEXTROSE 5 % IV SOLN
250.0000 mg | INTRAVENOUS | Status: DC
  Administered 2015-07-11: 250 mg via INTRAVENOUS
  Filled 2015-07-11 (×2): qty 250

## 2015-07-11 MED ORDER — TRAZODONE HCL 50 MG PO TABS
150.0000 mg | ORAL_TABLET | Freq: Every day | ORAL | Status: DC
  Administered 2015-07-11 – 2015-07-18 (×8): 150 mg via ORAL
  Filled 2015-07-11 (×8): qty 1

## 2015-07-11 MED ORDER — PANTOPRAZOLE SODIUM 40 MG PO TBEC
40.0000 mg | DELAYED_RELEASE_TABLET | Freq: Every day | ORAL | Status: DC
  Administered 2015-07-12 – 2015-07-19 (×6): 40 mg via ORAL
  Filled 2015-07-11 (×6): qty 1

## 2015-07-11 MED ORDER — ALPRAZOLAM 0.5 MG PO TABS
0.5000 mg | ORAL_TABLET | Freq: Three times a day (TID) | ORAL | Status: DC | PRN
  Administered 2015-07-11 – 2015-07-17 (×10): 0.5 mg via ORAL
  Filled 2015-07-11 (×11): qty 1

## 2015-07-11 MED ORDER — NICOTINE 21 MG/24HR TD PT24
21.0000 mg | MEDICATED_PATCH | Freq: Every day | TRANSDERMAL | Status: DC
  Administered 2015-07-12 – 2015-07-19 (×8): 21 mg via TRANSDERMAL
  Filled 2015-07-11 (×8): qty 1

## 2015-07-11 MED ORDER — ENOXAPARIN SODIUM 40 MG/0.4ML ~~LOC~~ SOLN: 40.0000 mg | SUBCUTANEOUS | Status: DC

## 2015-07-11 MED ORDER — DULOXETINE HCL 60 MG PO CPEP
90.0000 mg | ORAL_CAPSULE | Freq: Every day | ORAL | Status: DC
  Administered 2015-07-12 – 2015-07-19 (×6): 90 mg via ORAL
  Filled 2015-07-11 (×6): qty 1

## 2015-07-11 MED ORDER — CEFTRIAXONE SODIUM 1 G IJ SOLR
1.0000 g | INTRAMUSCULAR | Status: DC
  Administered 2015-07-11: 20:00:00 1 g via INTRAVENOUS
  Filled 2015-07-11 (×2): qty 10

## 2015-07-11 MED ORDER — LORATADINE 10 MG PO TABS
10.0000 mg | ORAL_TABLET | Freq: Every day | ORAL | Status: DC
  Administered 2015-07-12 – 2015-07-19 (×6): 10 mg via ORAL
  Filled 2015-07-11 (×6): qty 1

## 2015-07-11 MED ORDER — MORPHINE SULFATE (PF) 2 MG/ML IV SOLN
1.0000 mg | INTRAVENOUS | Status: DC | PRN
  Administered 2015-07-11 – 2015-07-13 (×15): 1 mg via INTRAVENOUS
  Filled 2015-07-11 (×15): qty 1

## 2015-07-11 MED ORDER — IPRATROPIUM-ALBUTEROL 0.5-2.5 (3) MG/3ML IN SOLN
3.0000 mL | Freq: Four times a day (QID) | RESPIRATORY_TRACT | Status: DC
  Administered 2015-07-11 – 2015-07-14 (×11): 3 mL via RESPIRATORY_TRACT
  Filled 2015-07-11 (×11): qty 3

## 2015-07-11 MED ORDER — MIRTAZAPINE 15 MG PO TABS
45.0000 mg | ORAL_TABLET | Freq: Every day | ORAL | Status: DC
  Administered 2015-07-11 – 2015-07-18 (×8): 45 mg via ORAL
  Filled 2015-07-11 (×8): qty 3

## 2015-07-11 MED ORDER — GABAPENTIN 800 MG PO TABS
800.0000 mg | ORAL_TABLET | Freq: Four times a day (QID) | ORAL | Status: DC
  Filled 2015-07-11: qty 1

## 2015-07-11 MED ORDER — ASPIRIN EC 81 MG PO TBEC
81.0000 mg | DELAYED_RELEASE_TABLET | Freq: Every day | ORAL | Status: DC
  Administered 2015-07-12: 81 mg via ORAL
  Filled 2015-07-11: qty 1

## 2015-07-11 MED ORDER — ONDANSETRON HCL 4 MG/2ML IJ SOLN: 4.0000 mg | Freq: Four times a day (QID) | INTRAMUSCULAR | Status: DC | PRN

## 2015-07-11 MED ORDER — METHYLPREDNISOLONE SODIUM SUCC 40 MG IJ SOLR
40.0000 mg | Freq: Two times a day (BID) | INTRAMUSCULAR | Status: DC
  Administered 2015-07-11 – 2015-07-12 (×2): 40 mg via INTRAVENOUS
  Filled 2015-07-11 (×2): qty 1

## 2015-07-11 MED ORDER — BUDESONIDE 0.25 MG/2ML IN SUSP
0.2500 mg | Freq: Two times a day (BID) | RESPIRATORY_TRACT | Status: DC
  Administered 2015-07-11 – 2015-07-19 (×16): 0.25 mg via RESPIRATORY_TRACT
  Filled 2015-07-11 (×16): qty 2

## 2015-07-11 MED ORDER — GABAPENTIN 400 MG PO CAPS
800.0000 mg | ORAL_CAPSULE | Freq: Four times a day (QID) | ORAL | Status: DC
  Administered 2015-07-11 – 2015-07-19 (×25): 800 mg via ORAL
  Filled 2015-07-11 (×25): qty 2

## 2015-07-11 MED ORDER — ONDANSETRON HCL 4 MG PO TABS: 4.0000 mg | ORAL_TABLET | Freq: Four times a day (QID) | ORAL | Status: DC | PRN

## 2015-07-11 MED ORDER — ACETAMINOPHEN 650 MG RE SUPP: 650.0000 mg | Freq: Four times a day (QID) | RECTAL | Status: DC | PRN

## 2015-07-11 MED ORDER — INSULIN ASPART 100 UNIT/ML ~~LOC~~ SOLN
0.0000 [IU] | Freq: Three times a day (TID) | SUBCUTANEOUS | Status: DC
  Administered 2015-07-12: 13:00:00 3 [IU] via SUBCUTANEOUS
  Administered 2015-07-13: 4 [IU] via SUBCUTANEOUS
  Administered 2015-07-13: 11 [IU] via SUBCUTANEOUS
  Administered 2015-07-15: 08:00:00 3 [IU] via SUBCUTANEOUS
  Administered 2015-07-15: 7 [IU] via SUBCUTANEOUS
  Administered 2015-07-15 – 2015-07-16 (×2): 3 [IU] via SUBCUTANEOUS
  Administered 2015-07-16 – 2015-07-17 (×4): 4 [IU] via SUBCUTANEOUS
  Administered 2015-07-18 – 2015-07-19 (×2): 3 [IU] via SUBCUTANEOUS
  Filled 2015-07-11: qty 4
  Filled 2015-07-11: qty 3
  Filled 2015-07-11 (×2): qty 4
  Filled 2015-07-11: qty 3
  Filled 2015-07-11: qty 11
  Filled 2015-07-11 (×2): qty 3
  Filled 2015-07-11: qty 4
  Filled 2015-07-11: qty 7
  Filled 2015-07-11 (×2): qty 3
  Filled 2015-07-11: qty 4

## 2015-07-11 MED ORDER — ACETAMINOPHEN 325 MG PO TABS
650.0000 mg | ORAL_TABLET | Freq: Four times a day (QID) | ORAL | Status: DC | PRN
  Administered 2015-07-16: 09:00:00 650 mg via ORAL
  Filled 2015-07-11: qty 2

## 2015-07-11 MED ORDER — ENALAPRIL MALEATE 5 MG PO TABS
10.0000 mg | ORAL_TABLET | Freq: Every day | ORAL | Status: DC
  Administered 2015-07-12 – 2015-07-19 (×5): 10 mg via ORAL
  Filled 2015-07-11 (×6): qty 2

## 2015-07-11 MED ORDER — INSULIN ASPART 100 UNIT/ML ~~LOC~~ SOLN
0.0000 [IU] | Freq: Every day | SUBCUTANEOUS | Status: DC
  Administered 2015-07-14: 4 [IU] via SUBCUTANEOUS
  Filled 2015-07-11: qty 4

## 2015-07-11 MED ORDER — MAGNESIUM OXIDE 400 (241.3 MG) MG PO TABS
400.0000 mg | ORAL_TABLET | Freq: Every day | ORAL | Status: DC
  Administered 2015-07-11 – 2015-07-18 (×8): 400 mg via ORAL
  Filled 2015-07-11 (×8): qty 1

## 2015-07-11 MED ORDER — SIMVASTATIN 20 MG PO TABS
20.0000 mg | ORAL_TABLET | Freq: Every day | ORAL | Status: DC
  Administered 2015-07-11 – 2015-07-18 (×8): 20 mg via ORAL
  Filled 2015-07-11 (×8): qty 1

## 2015-07-11 MED ORDER — METOPROLOL TARTRATE 25 MG PO TABS
25.0000 mg | ORAL_TABLET | Freq: Two times a day (BID) | ORAL | Status: DC
  Administered 2015-07-11 – 2015-07-19 (×15): 25 mg via ORAL
  Filled 2015-07-11 (×16): qty 1

## 2015-07-11 NOTE — Progress Notes (Signed)
Pharmacy Note - Enoxaparin Spoke with admitting MD regarding enoxaparin/aspirin and planned thoracentesis procedure; order to give enoxaparin/aspirin after thoracentesis, ok to time enoxaparin to start at bedtime as thoracentesis should be done tonight. Also per MD, no epidurals or spinal injections recently. Informed RN of plan for enoxaparin dose to be held until after thoracentesis; RN stated he would pass on to night shift. Next dose of aspirin due in AM. Also RN Merry Proud asked pt about recent spinal injections, epidural meds/catheter, etc. Per RN, pt stated last injection for med via a spinal route was a couple months ago, none recently.

## 2015-07-11 NOTE — Consult Note (Addendum)
[facility] Consultation      Assessment and Plan:  The patient is a 60 year old female presenting with undiagnosed left-sided chest pain, she underwent a thoracentesis earlier this month, she presents now with recurrence of the left-sided pleural effusion with complete left lung atelectasis.  Metastatic pleural effusion. -Review of most recent thoracentesis results suggest a lymphocytic exudative pleural effusion. 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  Adenocarcinoma. -Metastatic left pleural effusion, source of the cancer is uncertain, will consult oncology service to perform further workup.  Acute respiratory failure secondary to large left pleural effusion. -Interventional radiology has been consulted to perform another left-sided thoracentesis. - Should the pleural effusion recur, which I assume that we will, the patient may require placement of a indwelling pleural catheter versus pleurodesis.   COPD. -Duo nebs as needed while inpatient.  Sleep apnea. -Does not appear to be using CPAP at the present time can reevaluate on an outpatient basis.  I discussed the unfortunate diagnosis of cancer with the patient, I explained that we will need to find the primary source of this cancer and the type of cancer, with that we can determine the best possible course of treatment. The patient was understandably tearful and upset, I offered to call the patient's family and inform them of the diagnosis. However, she preferred for them to come into the hospital and hear it directly from her.   Date: 07/11/2015  MRN# [id number] Charlene Williams 23-Aug-1955  Referring Physician: Dr. Verdell Carmine.   Charlene Williams is a 60 y.o. old female seen in consultation for chief complaint of: Dyspnea    HPI:   The patient is a 60 year old female initially presented to the hospital earlier this month with left-sided chest pain. She was found to have a large to moderate left-sided  pleural effusion, she had a drainage procedure performed by interventional radiology. Approximate 1.5 L was drained from the left lung with improvement in her respiratory function. Review of her chest x-ray films at that time showed a moderate left-sided pleural effusion which appeared to have been drained, subsequently now she presents with a large left-sided pleural effusion with complete left lung atelectasis.  She returns today as a direct admission from her 54 office, she again complained of left-sided chest pain. She was admitted to the hospital where she had a chest x-ray which showed complete left lung atelectasis due to recurrence of the pleural effusion. 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   PMHX:   Past Medical History  Diagnosis Date  . Diabetes mellitus without complication ([place])   . Sleep apnea   . Hypertension   . Aneurysm ([place])   . Hiatal hernia   . COPD (chronic obstructive pulmonary disease) ([place])   . Illicit drug use   . Migraines   . Hypercholesteremia   . Depression   . Spine disorder   . Family history of chronic pain 01/17/2015   Surgical Hx:  Past Surgical History  Procedure Laterality Date  . Abdominal hysterectomy    . Hernia repair    . Eye surgery     Family Hx:  Family History  Problem Relation Age of Onset  . Heart disease Mother    Social Hx:   Social History  Substance Use Topics  . Smoking status: Current Every Day Smoker -- 0.50 packs/day for 45 years    Types: Cigarettes  . Smokeless tobacco: Not on  file  . Alcohol Use: No   Medication:   No current outpatient prescriptions on file.    Allergies:  Ampicillin and Penicillins  Review of Systems: Gen:  Denies  fever, sweats, chills HEENT: Denies blurred vision, double vision. bleeds, sore throat Cvc:  No dizziness, chest pain. Resp:   Denies cough or sputum  production. Gi: Denies swallowing difficulty, stomach pain. Gu:  Denies bladder incontinence, burning urine Ext:   No Joint pain, stiffness. Skin: No skin rash,  hives  Endoc:  No polyuria, polydipsia. Psych: No depression, insomnia. Other:  All other systems were reviewed with the patient and were negative other that what is mentioned in the HPI.   Physical Examination:   VS: BP 157/88 mmHg  Pulse 91  Temp(Src) 98.8 F (37.1 C) (Oral)  Resp 18  Ht '5\' 3"'$  (1.6 m)  Wt 163 lb 11.2 oz (74.254 kg)  BMI 29.01 kg/m2  SpO2 96%  General Appearance: No distress  Neuro:without focal findings,  speech normal,  HEENT: PERRLA, EOM intact.   Pulmonary: normal breath sounds, No wheezing.  CardiovascularNormal S1,S2.  No m/r/g.   Abdomen: Benign, Soft, non-tender. Renal:  No costovertebral tenderness  GU:  No performed at this time. Endoc: No evident thyromegaly, no signs of acromegaly. Skin:   warm, no rashes, no ecchymosis  Extremities: normal, no cyanosis, clubbing.  Other findings:    LABORATORY PANEL:   CBC  Recent Labs Lab 07/11/15 1700  WBC 7.4  HGB 12.7  HCT 37.7  PLT 430   ------------------------------------------------------------------------------------------------------------------  Chemistries   Recent Labs Lab 07/11/15 1700  NA 139  K 4.1  CL 106  CO2 25  GLUCOSE 97  BUN 11  CREATININE 0.88  CALCIUM 9.3   ------------------------------------------------------------------------------------------------------------------  Cardiac Enzymes No results for input(s): TROPONINI in the last 168 hours. ------------------------------------------------------------  RADIOLOGY:  Dg Chest 1 View  07/11/2015  CLINICAL DATA:  Left-sided chest pain. EXAM: CHEST 1 VIEW COMPARISON:  June 27, 2015. FINDINGS: There is interval development of complete opacification of the left hemi thorax with mild mediastinal shift to the right. This is concerning for underlying effusion  or possibly hemorrhage. Right lung is clear. No pneumothorax is noted. Bony thorax is unremarkable. IMPRESSION: Interval development of complete opacification of the left hemi thorax with mild mediastinal shift to the right. This is most consistent with large pleural effusion or possibly hemorrhage. CT scan is recommended for further evaluation. These results will be called to the ordering clinician or representative by the Radiologist Assistant, and communication documented in the PACS or zVision Dashboard. Electronically Signed   By: Marijo Conception, M.D.   On: 07/11/2015 17:42       Thank  you for the consultation and for allowing [facility] to assist in the care of your patient. Our recommendations are noted above.  Please contact us if we can be of further service.   Marda Stalker, MD.  Board Certified in Internal Medicine, Pulmonary Medicine, [place], and Sleep Medicine.  [facility] Office Number: [phone]  Patricia Pesa, M.D.  Vilinda Boehringer, M.D.  Merton Border, M.D  07/11/2015

## 2015-07-11 NOTE — Progress Notes (Signed)
Called by Dr. Ashby Dawes that pt's cytology on Pleural fluid was positive for adenocarcinoma.  Primary source of the malignancy is currently unclear. Oncology has been consult.  Patient's ultrasound guided thoracentesis to be done tomorrow as patient is not acutely hypoxic.

## 2015-07-11 NOTE — Progress Notes (Signed)
Chaplain provided a compassionate presence and spiritual support to the patient and family. Minerva Fester [phone]

## 2015-07-11 NOTE — H&P (Signed)
[facility] NAME: Charlene Williams    MR#:  [id number]  DATE OF BIRTH:  [birth year]  DATE OF ADMISSION:  07/11/2015  PRIMARY CARE PHYSICIAN: Kasandra Knudsen, NP   REQUESTING/REFERRING PHYSICIAN: Dr. Mclean-Scocuzza  CHIEF COMPLAINT:  No chief complaint on file. SOB, wheezing.   HISTORY OF PRESENT ILLNESS:  Charlene Williams  is a 60 y.o. female with a known history of diabetes type 2 without complication, hypertension, hiatal hernia, COPD, depression, hyperlipidemia who presents to the hospital due to shortness of breath and left-sided chest pain that has gotten worse over the past week or so. Patient was recently hospitalized about 2 weeks ago for a left lower lobe pneumonia and pleural effusion and had a thoracentesis done which was transudative in nature and discharged on oral antibiotics. Patient says that she was feeling well shortly after discharge when she continued to have significant left-sided chest pain which was pleuritic in nature. Her pain also radiated to her lower back. She went to see her primary care physician today who noticed that she was short of breath and also having significant wheezing and for the patient to the hospital for admission. Patient denies any documented fever but admits to some chills, positive shortness of breath, no nausea, vomiting, abdominal pain, diarrhea or any other associated symptoms.  PAST MEDICAL HISTORY:   Past Medical History  Diagnosis Date  . Diabetes mellitus without complication ([place])   . Sleep apnea   . Hypertension   . Aneurysm ([place])   . Hiatal hernia   . COPD (chronic obstructive pulmonary disease) ([place])   . Illicit drug use   . Migraines   . Hypercholesteremia   . Depression   . Spine disorder   . Family history of chronic pain 01/17/2015    PAST SURGICAL HISTORY:   Past Surgical History  Procedure Laterality Date  . Abdominal hysterectomy    . Hernia repair    . Eye  surgery      SOCIAL HISTORY:   Social History  Substance Use Topics  . Smoking status: Current Every Day Smoker -- 0.50 packs/day for 45 years    Types: Cigarettes  . Smokeless tobacco: Not on file  . Alcohol Use: No    FAMILY HISTORY:   Family History  Problem Relation Age of Onset  . Heart disease Mother     DRUG ALLERGIES:   Allergies  Allergen Reactions  . Ampicillin Hives and Other (See Comments)    Has patient had a PCN reaction causing immediate rash, facial/tongue/throat swelling, SOB or lightheadedness with hypotension: No Has patient had a PCN reaction causing severe rash involving mucus membranes or skin necrosis: No Has patient had a PCN reaction that required hospitalization No Has patient had a PCN reaction occurring within the last 10 years: Yes If all of the above answers are "NO", then may proceed with Cephalosporin use.  Marland Kitchen Penicillins Hives and Other (See Comments)    Has patient had a PCN reaction causing immediate rash, facial/tongue/throat swelling, SOB or lightheadedness with hypotension: No Has patient had a PCN reaction causing severe rash involving mucus membranes or skin necrosis: No Has patient had a PCN reaction that required hospitalization No Has patient had a PCN reaction occurring within the last 10 years: Yes If all of the above answers are "NO", then may proceed with Cephalosporin use.    REVIEW OF SYSTEMS:   Review of Systems  Constitutional: Negative for fever and weight  loss.  HENT: Negative for congestion, nosebleeds and tinnitus.   Eyes: Negative for blurred vision, double vision and redness.  Respiratory: Positive for shortness of breath and wheezing. Negative for cough and hemoptysis.   Cardiovascular: Positive for chest pain (left-sided pleuritic). Negative for orthopnea, leg swelling and PND.  Gastrointestinal: Negative for nausea, vomiting, abdominal pain, diarrhea and melena.  Genitourinary: Negative for dysuria, urgency and  hematuria.  Musculoskeletal: Negative for joint pain and falls.  Neurological: Negative for dizziness, tingling, sensory change, focal weakness, seizures, weakness and headaches.  Endo/Heme/Allergies: Negative for polydipsia. Does not bruise/bleed easily.  Psychiatric/Behavioral: Negative for depression and memory loss. The patient is not nervous/anxious.     MEDICATIONS AT HOME:   Prior to Admission medications   Medication Sig Start Date End Date Taking? Authorizing Provider  alendronate (FOSAMAX) 70 MG tablet Take 70 mg by mouth once a week. Pt takes on Tuesday.   Take with a full glass of water on an empty stomach.   Yes Historical Provider, MD  aspirin EC 81 MG tablet Take 81 mg by mouth daily.   Yes Historical Provider, MD  cetirizine (ZYRTEC) 10 MG tablet Take 10 mg by mouth at bedtime.   Yes Historical Provider, MD  DULoxetine (CYMBALTA) 30 MG capsule Take 90 mg by mouth daily.    Yes Historical Provider, MD  enalapril (VASOTEC) 10 MG tablet Take 10 mg by mouth daily.    Yes Historical Provider, MD  fluticasone (FLOVENT HFA) 220 MCG/ACT inhaler Inhale 1 puff into the lungs 2 (two) times daily.   Yes Historical Provider, MD  gabapentin (NEURONTIN) 800 MG tablet Take 800 mg by mouth 4 (four) times daily.   Yes Historical Provider, MD  magnesium oxide (MAG-OX) 400 (241.3 Mg) MG tablet Take 400 mg by mouth at bedtime.   Yes Historical Provider, MD  metFORMIN (GLUCOPHAGE-XR) 750 MG 24 hr tablet Take 750 mg by mouth daily with breakfast.   Yes Historical Provider, MD  metoprolol tartrate (LOPRESSOR) 25 MG tablet Take 1 tablet (25 mg total) by mouth 2 (two) times daily. 06/27/15  Yes Bettey Costa, MD  mirtazapine (REMERON) 45 MG tablet Take 45 mg by mouth at bedtime.   Yes Historical Provider, MD  nicotine (NICODERM CQ - DOSED IN MG/24 HOURS) 21 mg/24hr patch Place 1 patch (21 mg total) onto the skin daily. 06/27/15  Yes Bettey Costa, MD  omeprazole (PRILOSEC) 20 MG capsule Take 20 mg by mouth daily  before breakfast.    Yes Historical Provider, MD  simvastatin (ZOCOR) 20 MG tablet Take 20 mg by mouth at bedtime.    Yes Historical Provider, MD  traZODone (DESYREL) 150 MG tablet Take 150 mg by mouth at bedtime.   Yes Historical Provider, MD  levofloxacin (LEVAQUIN) 750 MG tablet Take 1 tablet (750 mg total) by mouth daily. Patient not taking: Reported on 07/11/2015 06/27/15   Bettey Costa, MD      VITAL SIGNS:  Blood pressure 157/88, pulse 91, temperature 98.8 F (37.1 C), temperature source Oral, resp. rate 18, height '5\' 3"'$  (1.6 m), weight 74.254 kg (163 lb 11.2 oz), SpO2 96 %.  PHYSICAL EXAMINATION:  Physical Exam  GENERAL:  60 y.o.-year-old patient lying in the bed in moderate distress.  EYES: Pupils equal, round, reactive to light and accommodation. No scleral icterus. Extraocular muscles intact.  HEENT: Head atraumatic, normocephalic. Oropharynx and nasopharynx clear. No oropharyngeal erythema, moist oral mucosa  NECK:  Supple, no jugular venous distention. No thyroid enlargement, no tenderness.  LUNGS: Normal breath sounds bilaterally, + end expiratory wheezing bilaterally, no rales, rhonchi. Poor air entry left mid to lower lung fields.  CARDIOVASCULAR: S1, S2 RRR. No murmurs, rubs, gallops, clicks.  ABDOMEN: Soft, nontender, nondistended. Bowel sounds present. No organomegaly or mass.  EXTREMITIES: No pedal edema, cyanosis, or clubbing. + 2 pedal & radial pulses b/l.   NEUROLOGIC: Cranial nerves II through XII are intact. No focal Motor or sensory deficits appreciated b/l PSYCHIATRIC: The patient is alert and oriented x 3. Good affect.  SKIN: No obvious rash, lesion, or ulcer.   LABORATORY PANEL:   CBC No results for input(s): WBC, HGB, HCT, PLT in the last 168 hours. ------------------------------------------------------------------------------------------------------------------  Chemistries  No results for input(s): NA, K, CL, CO2, GLUCOSE, BUN, CREATININE, CALCIUM, MG,  AST, ALT, ALKPHOS, BILITOT in the last 168 hours.  Invalid input(s): GFRCGP ------------------------------------------------------------------------------------------------------------------  Cardiac Enzymes No results for input(s): TROPONINI in the last 168 hours. ------------------------------------------------------------------------------------------------------------------  RADIOLOGY:  No results found.   IMPRESSION AND PLAN:   60 year old female with past medical history of hypertension, hyperlipidemia, diabetes, COPD, obstructive sleep apnea, migraines, chronic pain who presents to the hospital due to shortness of breath and left-sided pleuritic chest pain.  1. Pleural effusion-patient is a large left-sided pleural effusion which is recurrent in nature. -She was recently hospitalized for this about 2 weeks ago and had a thoracentesis. -I will get another ultrasound guided thoracentesis. A pulmonology consult. -Check pleural fluid for LDH, pH, cell count, cytology. -Continue oxygen support.  2. COPD exacerbation-due to pleural effusion/pneumonia. -Continue treatment as mentioned above, I will also start her on IV steroids, DuoNeb's on the clock, Pulmicort nebs. -Empirically treat her with IV antibiotics with ceftriaxone, Zithromax. Get a pulmonary consult.  3. Diabetes type 2 without complication-hold metformin. Continue sliding scale insulin.  4. Tobacco abuse-continue nicotine patch.  5. Essential hypertension-continue metoprolol.  6. Depression-continue Remeron, trazodone.  7. GERD-continue Protonix.  8. Hyperlipidemia-continue Zocor.    All the records are reviewed and case discussed with ED provider. Management plans discussed with the patient, family and they are in agreement.  CODE STATUS: Full  TOTAL TIME TAKING CARE OF THIS PATIENT: 50 minutes.    Henreitta Leber M.D on 07/11/2015 at 5:41 PM  Between 7am to 6pm - Pager - [phone]  After 6pm go to  [email or web address] - password [email or web address] [facility]  [facility]  Office  [phone]  CC: Primary care physician; Kasandra Knudsen, NP

## 2015-07-12 ENCOUNTER — Inpatient Hospital Stay: Payer: Medicare Other

## 2015-07-12 ENCOUNTER — Encounter: Payer: Self-pay | Admitting: Radiology

## 2015-07-12 DIAGNOSIS — C801 Malignant (primary) neoplasm, unspecified: Secondary | ICD-10-CM

## 2015-07-12 DIAGNOSIS — G4730 Sleep apnea, unspecified: Secondary | ICD-10-CM

## 2015-07-12 DIAGNOSIS — M549 Dorsalgia, unspecified: Secondary | ICD-10-CM

## 2015-07-12 DIAGNOSIS — C3492 Malignant neoplasm of unspecified part of left bronchus or lung: Secondary | ICD-10-CM

## 2015-07-12 DIAGNOSIS — J449 Chronic obstructive pulmonary disease, unspecified: Secondary | ICD-10-CM

## 2015-07-12 DIAGNOSIS — J910 Malignant pleural effusion: Secondary | ICD-10-CM

## 2015-07-12 DIAGNOSIS — E7800 Pure hypercholesterolemia, unspecified: Secondary | ICD-10-CM

## 2015-07-12 DIAGNOSIS — J9811 Atelectasis: Secondary | ICD-10-CM

## 2015-07-12 DIAGNOSIS — Z794 Long term (current) use of insulin: Secondary | ICD-10-CM

## 2015-07-12 DIAGNOSIS — C782 Secondary malignant neoplasm of pleura: Secondary | ICD-10-CM

## 2015-07-12 DIAGNOSIS — R0600 Dyspnea, unspecified: Secondary | ICD-10-CM

## 2015-07-12 DIAGNOSIS — Z79899 Other long term (current) drug therapy: Secondary | ICD-10-CM

## 2015-07-12 DIAGNOSIS — F17210 Nicotine dependence, cigarettes, uncomplicated: Secondary | ICD-10-CM

## 2015-07-12 DIAGNOSIS — E119 Type 2 diabetes mellitus without complications: Secondary | ICD-10-CM

## 2015-07-12 LAB — BASIC METABOLIC PANEL
Anion gap: 8 (ref 5–15)
BUN: 11 mg/dL (ref 6–20)
CALCIUM: 9.1 mg/dL (ref 8.9–10.3)
CO2: 24 mmol/L (ref 22–32)
CREATININE: 0.9 mg/dL (ref 0.44–1.00)
Chloride: 103 mmol/L (ref 101–111)
GFR calc Af Amer: >60 mL/min (ref >60)
GLUCOSE: 145 mg/dL — AB (ref 65–99)
Potassium: 4.5 mmol/L (ref 3.5–5.1)
SODIUM: 135 mmol/L (ref 135–145)

## 2015-07-12 LAB — CBC
HCT: 38.5 % (ref 35.0–47.0)
Hemoglobin: 12.8 g/dL (ref 12.0–16.0)
MCH: 28.5 pg (ref 26.0–34.0)
MCHC: 33.4 g/dL (ref 32.0–36.0)
MCV: 85.3 fL (ref 80.0–100.0)
Platelets: 405 10*3/uL (ref 150–440)
RBC: 4.51 MIL/uL (ref 3.80–5.20)
RDW: 14.5 % (ref 11.5–14.5)
WBC: 8.6 10*3/uL (ref 3.6–11.0)

## 2015-07-12 LAB — GLUCOSE, CAPILLARY
GLUCOSE-CAPILLARY: 107 mg/dL — AB (ref 65–99)
GLUCOSE-CAPILLARY: 139 mg/dL — AB (ref 65–99)
Glucose-Capillary: 138 mg/dL — ABNORMAL HIGH (ref 65–99)
Glucose-Capillary: 105 mg/dL — ABNORMAL HIGH (ref 65–99)
Glucose-Capillary: 110 mg/dL — ABNORMAL HIGH (ref 65–99)

## 2015-07-12 MED ORDER — ALPRAZOLAM 0.5 MG PO TABS
0.5000 mg | ORAL_TABLET | Freq: Once | ORAL | Status: AC
  Administered 2015-07-12: 21:00:00 0.5 mg via ORAL
  Filled 2015-07-12: qty 1

## 2015-07-12 MED ORDER — DIATRIZOATE MEGLUMINE & SODIUM 66-10 % PO SOLN
15.0000 mL | ORAL | Status: AC
  Administered 2015-07-12 (×2): 15 mL via ORAL

## 2015-07-12 MED ORDER — IOPAMIDOL (ISOVUE-300) INJECTION 61%
100.0000 mL | Freq: Once | INTRAVENOUS | Status: AC | PRN
  Administered 2015-07-12: 100 mL via INTRAVENOUS

## 2015-07-12 MED ORDER — HYDROCODONE-ACETAMINOPHEN 5-325 MG PO TABS
1.0000 | ORAL_TABLET | ORAL | Status: DC | PRN
  Administered 2015-07-12 – 2015-07-15 (×7): 1 via ORAL
  Filled 2015-07-12 (×8): qty 1

## 2015-07-12 MED ORDER — TRAMADOL HCL 50 MG PO TABS: 50.0000 mg | ORAL_TABLET | Freq: Four times a day (QID) | ORAL | Status: DC | PRN

## 2015-07-12 NOTE — Progress Notes (Signed)
Please note was made aware by patient of a knot under skin on deltoid muscle in right upper arm, second shift RN  Aware and will let rounding MD know.

## 2015-07-12 NOTE — Progress Notes (Signed)
   07/12/15 0930  Clinical Encounter Type  Visited With Patient  Visit Type Initial  Referral From Nurse  Consult/Referral To Chaplain  Spiritual Encounters  Spiritual Needs Emotional;Prayer  Stress Factors  Patient Stress Factors Exhausted;Family relationships;Health changes  Visited patient who expressed deep anxiety caused by diagnosis from yesterday. She shared her family history and concerns for their future. We will continue to meet and discuss her concerns. Chap. Rosangelica Pevehouse G. [place]

## 2015-07-12 NOTE — Progress Notes (Addendum)
No new complaints Dyspneic with minimal exertion No distress @ rest  Filed Vitals:   07/12/15 0230 07/12/15 0456 07/12/15 0731 07/12/15 0829  BP:  153/68  118/69  Pulse:  82  94  Temp:  98 F (36.7 C)  98.8 F (37.1 C)  TempSrc:  Oral  Oral  Resp:  20    Height:      Weight:      SpO2: 92% 98% 93% 92%   NAD No JVD Diminished BS on L, dull throughout on L Reg, no M No C/C/E  BMP Latest Ref Rng 07/12/2015 07/11/2015 06/26/2015  Glucose 65 - 99 mg/dL 145(H) 97 130(H)  BUN 6 - 20 mg/dL '11 11 11  '$ Creatinine 0.44 - 1.00 mg/dL 0.90 0.88 0.79  Sodium 135 - 145 mmol/L 135 139 136  Potassium 3.5 - 5.1 mmol/L 4.5 4.1 4.6  Chloride 101 - 111 mmol/L 103 106 107  CO2 22 - 32 mmol/L '24 25 23  '$ Calcium 8.9 - 10.3 mg/dL 9.1 9.3 8.9    CBC Latest Ref Rng 07/12/2015 07/11/2015 06/25/2015  WBC 3.6 - 11.0 K/uL 8.6 7.4 6.1  Hemoglobin 12.0 - 16.0 g/dL 12.8 12.7 13.8  Hematocrit 35.0 - 47.0 % 38.5 37.7 41.4  Platelets 150 - 440 K/uL 405 430 324    CXR (07/11/15): Complete opacification of L hemithorax  Cytology from pleural fluid 06/26/15: Adenocarcinoma  IMPRESSION: 1) Dyspnea - this is fully explained by massive, rapidly recurrent malignant L pleural effusion. There is no evidence of PNA or bronchospasm. This will need chest tube drainage and pleurodesis 2) New diagnosis of adenocarcinoma, primary presently unknown  PLAN/REC: Thoracic surgery consultation to consider thoracostomy and talc pleurodesis Please note that CT chest will be more informative if performed after pleural fluid is drained I have canceled US guided thoracentesis for now Oncology consultation has been requested already   PCCM will follow from a distance. Please call if she develops severe respiratory distress or if we are able to be of assistance in any way  Merton Border, MD PCCM service Mobile [phone] Pager [phone] 07/12/2015

## 2015-07-12 NOTE — Progress Notes (Signed)
Charlene Williams at [facility] NAME: Charlene Williams    MR#:  [id number]  DATE OF BIRTH:  [birth year]  SUBJECTIVE: I'm hurting in the right shoulder, Shoulder blade.  CHIEF COMPLAINT:  No chief complaint on file.  the patient is a 60 year old African-American female with past medical history significant for history of hypertension, hyperlipidemia, diabetes mellitus, COPD, not on oxygen at home, recent admission to the hospital and discharge on 06/27/2015 for left pleural effusion, left lower lobe pneumonia, SIRS on levofloxacin, who presents back to the hospital due to shortness of breath and left-sided chest pain, pleuritic in nature. She was seen by primary care physician and was sent back to the hospital for admission. Chest x-ray done in the emergency room revealed complete opacification of left hemithorax with mediastinal shift to the right, concerning for large pleural effusion. Adenocarcinoma was noted on cytology testing per pulmonologist, Dr. Ashby Dawes. Left thoracentesis is being planned for today. The patient is complaining of right shoulder and right shoulder blade pain, , not able to elaborate due to significant emotional distress, crying     Review of Systems  Constitutional: Negative for fever, chills and weight loss.  HENT: Negative for congestion.   Eyes: Negative for blurred vision and double vision.  Respiratory: Positive for shortness of breath. Negative for cough, sputum production and wheezing.   Cardiovascular: Positive for chest pain. Negative for palpitations, orthopnea, leg swelling and PND.  Gastrointestinal: Negative for nausea, vomiting, abdominal pain, diarrhea, constipation and blood in stool.  Genitourinary: Negative for dysuria, urgency, frequency and hematuria.  Musculoskeletal: Negative for falls.  Neurological: Negative for dizziness, tremors, focal weakness and headaches.  Endo/Heme/Allergies: Does not bruise/bleed  easily.  Psychiatric/Behavioral: Negative for depression. The patient does not have insomnia.     VITAL SIGNS: Blood pressure 118/69, pulse 94, temperature 98.8 F (37.1 C), temperature source Oral, resp. rate 20, height '5\' 3"'$  (1.6 m), weight 74.254 kg (163 lb 11.2 oz), SpO2 92 %.  PHYSICAL EXAMINATION:   GENERAL:  60 y.o.-year-old patient lying in the bed in moderate emotional  distress, Crying.  EYES: Pupils equal, round, reactive to light and accommodation. No scleral icterus. Extraocular muscles intact.  HEENT: Head atraumatic, normocephalic. Oropharynx and nasopharynx clear.  NECK:  Supple, no jugular venous distention. No thyroid enlargement, no tenderness.  LUNGS:Markedly diminished breath  sounds. On the left, dull to percussion, no wheezing,few rhonchi , no crepitations. Not use of accessory muscles of respiration. Chest is tender to palpation on the right side posteriorly. No bruising was noted. No swelling  CARDIOVASCULAR: S1, S2 normal. No murmurs, rubs, or gallops.  ABDOMEN: Soft, nontender, nondistended. Bowel sounds present. No organomegaly or mass.  EXTREMITIES: No pedal edema, cyanosis, or clubbing.  NEUROLOGIC: Cranial nerves II through XII are intact. Muscle strength 5/5 in all extremities. Sensation intact. Gait not checked.  PSYCHIATRIC: The patient is alert and oriented x 3, Emotionally distressed, crying.  SKIN: No obvious rash, lesion, or ulcer.   ORDERS/RESULTS REVIEWED:   CBC  Recent Labs Lab 07/11/15 1700 07/12/15 0527  WBC 7.4 8.6  HGB 12.7 12.8  HCT 37.7 38.5  PLT 430 405  MCV 83.9 85.3  MCH 28.3 28.5  MCHC 33.8 33.4  RDW 14.8* 14.5   ------------------------------------------------------------------------------------------------------------------  Chemistries   Recent Labs Lab 07/11/15 1700 07/12/15 0527  NA 139 135  K 4.1 4.5  CL 106 103  CO2 25 24  GLUCOSE 97 145*  BUN 11 11  CREATININE  0.88 0.90  CALCIUM 9.3 9.1    ------------------------------------------------------------------------------------------------------------------ estimated creatinine clearance is 63.4 mL/min (by C-G formula based on Cr of 0.9). ------------------------------------------------------------------------------------------------------------------ No results for input(s): TSH, T4TOTAL, T3FREE, THYROIDAB in the last 72 hours.  Invalid input(s): FREET3  Cardiac Enzymes No results for input(s): CKMB, TROPONINI, MYOGLOBIN in the last 168 hours.  Invalid input(s): CK ------------------------------------------------------------------------------------------------------------------ Invalid input(s): POCBNP ---------------------------------------------------------------------------------------------------------------  RADIOLOGY: Dg Chest 1 View  07/11/2015  CLINICAL DATA:  Left-sided chest pain. EXAM: CHEST 1 VIEW COMPARISON:  June 27, 2015. FINDINGS: There is interval development of complete opacification of the left hemi thorax with mild mediastinal shift to the right. This is concerning for underlying effusion or possibly hemorrhage. Right lung is clear. No pneumothorax is noted. Bony thorax is unremarkable. IMPRESSION: Interval development of complete opacification of the left hemi thorax with mild mediastinal shift to the right. This is most consistent with large pleural effusion or possibly hemorrhage. CT scan is recommended for further evaluation. These results will be called to the ordering clinician or representative by the Radiologist Assistant, and communication documented in the PACS or zVision Dashboard. Electronically Signed   By: Marijo Conception, M.D.   On: 07/11/2015 17:42    EKG:  Orders placed or performed during the hospital encounter of 06/25/15  . ED EKG within 10 minutes  . ED EKG within 10 minutes  . EKG 12-Lead  . EKG 12-Lead  . EKG    ASSESSMENT AND PLAN:  Active Problems:   COPD exacerbation  ([place]) #1. 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 #2 . Adjustment reaction, supportive therapy, Xanax as needed, follow clinically,  patient is on Remeron #3. Essential hypertension, controlled #4. Diabetes mellitus, continue diabetic diet, sliding scale insulin, blood glucose levels could be higher because of steroids. #5. COPD exacerbation, continue steroids, inhalation therapy, relatively stable, initiate oxygen therapy as needed #6. Suspected Left sided pneumonia, continue antibiotic therapy, get sputum cultures if possible, getting CT scan of the chest to rule out pneumonia  Management plans discussed with the patient, family and they are in agreement.   DRUG ALLERGIES:  Allergies  Allergen Reactions  . Ampicillin Hives and Other (See Comments)    Has patient had a PCN reaction causing immediate rash, facial/tongue/throat swelling, SOB or lightheadedness with hypotension: No Has patient had a PCN reaction causing severe rash involving mucus membranes or skin necrosis: No Has patient had a PCN reaction that required hospitalization No Has patient had a PCN reaction occurring within the last 10 years: Yes If all of the above answers are "NO", then may proceed with Cephalosporin use.  Marland Kitchen Penicillins Hives and Other (See Comments)    Has patient had a PCN reaction causing immediate rash, facial/tongue/throat swelling, SOB or lightheadedness with hypotension: No Has patient had a PCN reaction causing severe rash involving mucus membranes or skin necrosis: No Has patient had a PCN reaction that required hospitalization No Has patient had a PCN reaction occurring within the last 10 years: Yes If all of the above answers are "NO", then may proceed with Cephalosporin use.    CODE STATUS:     Code Status Orders         Start     Ordered   07/11/15 1725  Full code   Continuous     07/11/15 1725    Code Status  History    Date Active Date Inactive Code Status Order ID Comments User Context   06/25/2015  6:35 PM 06/27/2015  4:00 PM Full Code [id number]  Theodoro Grist, MD ED      TOTAL TIME TAKING CARE OF THIS PATIENT: 45 minutes.  Emotional support to the patient was provided, time spent approximately 15 minutes  Lamel Mccarley M.D on 07/12/2015 at 11:20 AM  Between 7am to 6pm - Pager - [phone]  After 6pm go to [email or web address] - password [email or web address] [facility]  [facility]  Office  [phone]  CC: Primary care physician; Kasandra Knudsen, NP

## 2015-07-12 NOTE — Consult Note (Signed)
[facility] NOTE  Patient Care Team: Kasandra Knudsen, NP as PCP - General (Nurse Practitioner)  CHIEF COMPLAINTS/PURPOSE OF CONSULTATION: Malignant pleural effusion.  HISTORY OF PRESENTING ILLNESS:  Charlene Williams 60 y.o.  female COPD/smoker was in the hospital approximately 2 weeks ago with a left-sided pleural effusion- that was drained cytology positive for adenocarcinoma suggestive of lung origin. CT scan approximately 2 weeks ago showed moderate-sized Pleural effusion with atelectasis. Possible airspace density with nodularity in the posterior left upper lobe.   Patient presents to the hospital for recurrent shortness of breath; and left-sided chest pain- chest x-ray showed large left-sided pleural effusion/left lung atelectasis.  A repeat CT scan this morning shows approximately 5 cm mass in the left lower lobe; large left-sided pleural effusion. Pulmonary service has evaluated the patient recommend pleurodesis; awaiting surgical evaluation.  Patient continues to complain of shortness of breath no cough. She complains of abdominal distention. Otherwise no nausea no vomiting. She has lost about 10-15 pounds in the last few months. No headaches. No falls.  ROS: A complete 10 point review of system is done which is negative except mentioned above in history of present illness  MEDICAL HISTORY:  Past Medical History  Diagnosis Date  . Diabetes mellitus without complication ([place])   . Sleep apnea   . Hypertension   . Aneurysm ([place])   . Hiatal hernia   . COPD (chronic obstructive pulmonary disease) ([place])   . Illicit drug use   . Migraines   . Hypercholesteremia   . Depression   . Spine disorder   . Family history of chronic pain 01/17/2015    SURGICAL HISTORY: Past Surgical History  Procedure Laterality Date  . Abdominal hysterectomy    . Hernia repair    . Eye surgery      SOCIAL HISTORY: Patient lives by herself in [place]. However her daughter  plans to move to live with her.  She is fairly active for age. She is on disability because of back pain.  Quit smoking 2 weeks ago.  Social History   Social History  . Marital Status: Divorced    Spouse Name: N/A  . Number of Children: N/A  . Years of Education: N/A   Occupational History  . Not on file.   Social History Main Topics  . Smoking status: Current Every Day Smoker -- 0.50 packs/day for 45 years    Types: Cigarettes  . Smokeless tobacco: Not on file  . Alcohol Use: No  . Drug Use: Yes     Comment: "sometimes I smoke a blunt so I can go to sleep"  . Sexual Activity: Not on file   Other Topics Concern  . Not on file   Social History Narrative    FAMILY HISTORY: Family History  Problem Relation Age of Onset  . Heart disease Mother     ALLERGIES:  is allergic to ampicillin and penicillins.  MEDICATIONS:  Current Facility-Administered Medications  Medication Dose Route Frequency Provider Last Rate Last Dose  . acetaminophen (TYLENOL) tablet 650 mg  650 mg Oral Q6H PRN Henreitta Leber, MD       Or  . acetaminophen (TYLENOL) suppository 650 mg  650 mg Rectal Q6H PRN Henreitta Leber, MD      . ALPRAZolam Duanne Moron) tablet 0.5 mg  0.5 mg Oral TID PRN Max Sane, MD   0.5 mg at 07/11/15 1937  . aspirin EC tablet 81 mg  81 mg Oral Daily Vivek J  Sainani, MD   81 mg at 07/12/15 0842  . azithromycin (ZITHROMAX) 250 mg in dextrose 5 % 125 mL IVPB  250 mg Intravenous Q24H Henreitta Leber, MD   250 mg at 07/11/15 2051  . budesonide (PULMICORT) nebulizer solution 0.25 mg  0.25 mg Nebulization BID Henreitta Leber, MD   0.25 mg at 07/12/15 0731  . cefTRIAXone (ROCEPHIN) 1 g in dextrose 5 % 50 mL IVPB  1 g Intravenous Q24H Henreitta Leber, MD   1 g at 07/11/15 1947  . DULoxetine (CYMBALTA) DR capsule 90 mg  90 mg Oral Daily Henreitta Leber, MD   90 mg at 07/12/15 0842  . enalapril (VASOTEC) tablet 10 mg  10 mg Oral Daily Henreitta Leber, MD   10 mg at 07/12/15 0841  . enoxaparin  (LOVENOX) injection 40 mg  40 mg Subcutaneous Q24H Henreitta Leber, MD   40 mg at 07/11/15 2024  . gabapentin (NEURONTIN) capsule 800 mg  800 mg Oral QID Henreitta Leber, MD   800 mg at 07/12/15 0841  . HYDROcodone-acetaminophen (NORCO/VICODIN) 5-325 MG per tablet 1 tablet  1 tablet Oral Q4H PRN Theodoro Grist, MD   1 tablet at 07/12/15 0953  . insulin aspart (novoLOG) injection 0-20 Units  0-20 Units Subcutaneous TID WC Henreitta Leber, MD   3 Units at 07/12/15 1230  . insulin aspart (novoLOG) injection 0-5 Units  0-5 Units Subcutaneous QHS Henreitta Leber, MD   0 Units at 07/11/15 2054  . ipratropium-albuterol (DUONEB) 0.5-2.5 (3) MG/3ML nebulizer solution 3 mL  3 mL Nebulization Q6H Henreitta Leber, MD   3 mL at 07/12/15 0731  . loratadine (CLARITIN) tablet 10 mg  10 mg Oral Daily Henreitta Leber, MD   10 mg at 07/12/15 0842  . magnesium oxide (MAG-OX) tablet 400 mg  400 mg Oral QHS Henreitta Leber, MD   400 mg at 07/11/15 2052  . methylPREDNISolone sodium succinate (SOLU-MEDROL) 40 mg/mL injection 40 mg  40 mg Intravenous Q12H Henreitta Leber, MD   40 mg at 07/12/15 0608  . metoprolol tartrate (LOPRESSOR) tablet 25 mg  25 mg Oral BID Henreitta Leber, MD   25 mg at 07/12/15 0842  . mirtazapine (REMERON) tablet 45 mg  45 mg Oral QHS Henreitta Leber, MD   45 mg at 07/11/15 2052  . morphine 2 MG/ML injection 1 mg  1 mg Intravenous Q3H PRN Henreitta Leber, MD   1 mg at 07/12/15 1053  . nicotine (NICODERM CQ - dosed in mg/24 hours) patch 21 mg  21 mg Transdermal Daily Henreitta Leber, MD   21 mg at 07/12/15 0841  . ondansetron (ZOFRAN) tablet 4 mg  4 mg Oral Q6H PRN Henreitta Leber, MD       Or  . ondansetron (ZOFRAN) injection 4 mg  4 mg Intravenous Q6H PRN Henreitta Leber, MD      . pantoprazole (PROTONIX) EC tablet 40 mg  40 mg Oral Daily Henreitta Leber, MD   40 mg at 07/12/15 0841  . simvastatin (ZOCOR) tablet 20 mg  20 mg Oral QHS Henreitta Leber, MD   20 mg at 07/11/15 2052  . traZODone  (DESYREL) tablet 150 mg  150 mg Oral QHS Henreitta Leber, MD   150 mg at 07/11/15 2052      .  PHYSICAL EXAMINATION:   Filed Vitals:   07/12/15 0456 07/12/15 0829  BP: 153/68  118/69  Pulse: 82 94  Temp: 98 F (36.7 C) 98.8 F (37.1 C)  Resp: 20    Filed Weights   07/11/15 1718  Weight: 163 lb 11.2 oz (74.254 kg)    GENERAL: Well-nourished well-developed; Alert, no distress and comfortable. With daughter/ grandson,; Anxious.  EYES: no pallor or icterus OROPHARYNX: no thrush or ulceration; good dentition  NECK: supple, no masses felt LYMPH:  no palpable lymphadenopathy in the cervical, axillary or inguinal regions LUNGS: Diminished breath sounds on the left lung ; No wheeze or crackles HEART/CVS: regular rate & rhythm and no murmurs; No lower extremity edema ABDOMEN: abdomen soft, non-tender and normal bowel sounds; distended. No hepatosplenomegaly. Musculoskeletal:no cyanosis of digits and no clubbing  PSYCH: alert & oriented x 3 with fluent speech NEURO: no focal motor/sensory deficits SKIN:  no rashes or significant lesions  LABORATORY DATA:  I have reviewed the data as listed Lab Results  Component Value Date   WBC 8.6 07/12/2015   HGB 12.8 07/12/2015   HCT 38.5 07/12/2015   MCV 85.3 07/12/2015   PLT 405 07/12/2015    Recent Labs  06/26/15 0040 07/11/15 1700 07/12/15 0527  NA 136 139 135  K 4.6 4.1 4.5  CL 107 106 103  CO2 _0 GLUCOSE 130* 97 145*  BUN _1 CREATININE 0.79 0.88 0.90  CALCIUM 8.9 9.3 9.1  GFRNONAA >60 >60 >60  GFRAA >60 >60 >60  PROT 6.9  --   --   ALBUMIN 3.3*  --   --   AST 20  --   --   ALT 15  --   --   ALKPHOS 93  --   --   BILITOT 0.5  --   --     RADIOGRAPHIC STUDIES: I have personally reviewed the radiological images as listed and agreed with the findings in the report. Dg Chest 1 View  07/11/2015  CLINICAL DATA:  Left-sided chest pain. EXAM: CHEST 1 VIEW COMPARISON:  June 27, 2015. FINDINGS: There is  interval development of complete opacification of the left hemi thorax with mild mediastinal shift to the right. This is concerning for underlying effusion or possibly hemorrhage. Right lung is clear. No pneumothorax is noted. Bony thorax is unremarkable. IMPRESSION: Interval development of complete opacification of the left hemi thorax with mild mediastinal shift to the right. This is most consistent with large pleural effusion or possibly hemorrhage. CT scan is recommended for further evaluation. These results will be called to the ordering clinician or representative by the Radiologist Assistant, and communication documented in the PACS or zVision Dashboard. Electronically Signed   By: Marijo Conception, M.D.   On: 07/11/2015 17:42   Dg Chest 1 View  06/27/2015  CLINICAL DATA:  Recent history of pneumonia, followup exam EXAM: CHEST 1 VIEW COMPARISON:  06/26/2015 FINDINGS: Persistent left basilar changes are noted with infiltrate and pleural effusion. The cardiac shadow is stable. Right lung remains clear. No acute bony abnormality is noted. IMPRESSION: Persistent changes in the left lung base. Electronically Signed   By: Inez Catalina M.D.   On: 06/27/2015 09:51   Dg Chest 1 View  06/26/2015  CLINICAL DATA:  Status post left thoracentesis. EXAM: CHEST 1 VIEW COMPARISON:  June 25, 2015. FINDINGS: Stable cardiomediastinal silhouette. Right lung is clear. No pneumothorax is noted. Left pleural effusion is significantly smaller status post thoracentesis. Left basilar atelectasis or infiltrate is noted. IMPRESSION: No pneumothorax status post left thoracentesis. Left  pleural effusion is significantly smaller. Electronically Signed   By: Marijo Conception, M.D.   On: 06/26/2015 11:49   Dg Chest 2 View  06/25/2015  CLINICAL DATA:  Left-sided chest pain. EXAM: CHEST  2 VIEW COMPARISON:  April 03, 2012. FINDINGS: There is interval development of moderate left pleural effusion with probable underlying atelectasis or  infiltrate. Right lung is clear. No pneumothorax is noted. Bony thorax is unremarkable. IMPRESSION: Interval development of moderate left pleural effusion with probable underlying atelectasis or infiltrate. Electronically Signed   By: Marijo Conception, M.D.   On: 06/25/2015 13:26   Ct Chest Wo Contrast  06/25/2015  CLINICAL DATA:  Patient recently treated for pneumonia. Today has increasing shortness of breath with left left-sided chest pain and left effusion on chest x-ray. EXAM: CT CHEST WITHOUT CONTRAST TECHNIQUE: Multidetector CT imaging of the chest was performed following the standard protocol without IV contrast. COMPARISON:  Chest x-ray today and abdominal pelvic CT 07/18/2012. FINDINGS: Examination demonstrates a moderate size left pleural effusion with compressive atelectasis in the left base. Possible mild associated airspace density with nodularity over the posterior left upper lobe as cannot exclude infection. Right lung is clear. Airways are within normal. Heart is normal in size. There is calcified plaque over the left main and 3 vessel coronary arteries. Calcified plaque is present over the thoracic aorta. There is no significant mediastinal or axillary adenopathy. Remaining mediastinal structures are unremarkable. Images through the upper abdomen are unchanged. There are mild degenerate changes of the spine. IMPRESSION: Moderate size left pleural effusion with compressive atelectasis in the left base. Suggestion of associated airspace density with nodularity over the posterior left upper lobe which may be due to infection. Recommend follow-up CT 3-4 weeks. Atherosclerotic coronary artery disease. Electronically Signed   By: Marin Olp M.D.   On: 06/25/2015 17:40   US Thoracentesis Asp Pleural Space W/img Guide  06/26/2015  INDICATION: Left pleural effusion. EXAM: ULTRASOUND GUIDED left THORACENTESIS MEDICATIONS: None. COMPLICATIONS: None immediate. PROCEDURE: An ultrasound guided thoracentesis  was thoroughly discussed with the patient and questions answered. The benefits, risks, alternatives and complications were also discussed. The patient understands and wishes to proceed with the procedure. Written consent was obtained. Ultrasound was performed to localize and mark an adequate pocket of fluid in the left chest. The area was then prepped and draped in the normal sterile fashion. 1% Lidocaine was used for local anesthesia. Under ultrasound guidance a Safe-T-Centesis catheter was introduced. Thoracentesis was performed. The catheter was removed and a dressing applied. FINDINGS: A total of approximately 1.5 L of bloody serous fluid was removed. Samples were sent to the laboratory as requested by the clinical team. IMPRESSION: Successful ultrasound guided left thoracentesis yielding 1.5 L of pleural fluid. Electronically Signed   By: Marijo Conception, M.D.   On: 06/26/2015 11:50    ASSESSMENT & PLAN:   60 year old female patient with history of smoking with a recurrent left-sided pleural effusion- cytology positive for adenocarcinoma.  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 Likely suggestive of lung origin. Pleurodesis- likely going to help temporize the fluid buildup.  # Stage IV adenocarcinoma the lung- discussed the patient and family that this is incurable; however could be well treated with chemotherapy/immunotherapy. Also discussed that median life expectancy is in the order of 1-2 years. I would recommend carboplatin and Alimta/Avastin chemotherapy. Also check for driving mutations/ PDL-1 status.   # Also recommend port placement. Will discuss with  thoracic surgery; pulmonary. I reviewed the images myself and with the patient and daughter in detail.   Thank you Dr. Manuella Ghazi for allowing me to participate in the care of your pleasant patient. Please do not hesitate to contact me with questions or concerns in the interim.      Cammie Sickle, MD 07/12/2015 1:39 PM

## 2015-07-12 NOTE — Progress Notes (Signed)
Initial Nutrition Assessment  DOCUMENTATION CODES:   Not applicable  INTERVENTION:   -Cater to pt preferences -Recommend liberalizing diet order to Regular to optimize nutritional intake (RD notes blood sugars have been WDL and no insulin given this am) Will follow, if intake adequate will recommend placing back on Carb Modified diet order -Recommended Ensure Enlive/supplement to pt and pt replied 'not yet' will recommend again on follow if intake inadequate -Recommend collecting daily weights as pt to have thoracentesis   NUTRITION DIAGNOSIS:   Inadequate oral intake related to poor appetite as evidenced by per patient/family report.  GOAL:   Patient will meet greater than or equal to 90% of their needs  MONITOR:   PO intake, Labs, Weight trends, I & O's  REASON FOR ASSESSMENT:   Malnutrition Screening Tool    ASSESSMENT:   Pt admitted with SOB  secondary to Malignant pleural effusion due to adenocarcinoma. Pt with recent admission 2 weeks ago s/p thoracentesis then. Pt with h/o COPD and DM. Pt scheduled for left thoracentesis today.  Past Medical History  Diagnosis Date  . Diabetes mellitus without complication ([place])   . Sleep apnea   . Hypertension   . Aneurysm ([place])   . Hiatal hernia   . COPD (chronic obstructive pulmonary disease) ([place])   . Illicit drug use   . Migraines   . Hypercholesteremia   . Depression   . Spine disorder   . Family history of chronic pain 01/17/2015    Diet Order:  Diet regular Room service appropriate?: Yes; Fluid consistency:: Thin   Pt very tearful on visit regarding new diagnosis. Pt reports poor po intake, poor appetite for months. Pt reports feeling bloated for weeks and had complained to outpatient MD. Pt reports being placed on remeron for the past 4 weeks to help her appetite to which she reports not seeing a difference. Pt reports not wanting to eat anything except drink coffee all day long.   Medications: SS novolog,  Solumedrol, Remeron, Protonix Labs: reviewed, WDL   Gastrointestinal Profile: Last BM:  07/11/2015   Nutrition-Focused Physical Exam Findings: Nutrition-Focused physical exam completed. Findings are WDL for fat depletion, muscle depletion, and edema.    Weight Change: Pt reports weight has gone down about 4lbs PTA. Pt reports usual body weight of 162lbs. RD notes on last admission 1.5L removed via thoracentesis in chart review. Pt current weight of 163lbs.   Skin:  Reviewed, no issues   Height:   Ht Readings from Last 1 Encounters:  07/11/15 '5\' 3"'$  (1.6 m)    Weight:   Wt Readings from Last 1 Encounters:  07/11/15 163 lb 11.2 oz (74.254 kg)   Wt Readings from Last 10 Encounters:  07/11/15 163 lb 11.2 oz (74.254 kg)  06/25/15 164 lb 6.4 oz (74.571 kg)  05/25/15 162 lb (73.483 kg)  05/15/15 160 lb (72.576 kg)  03/28/15 162 lb (73.483 kg)  02/02/15 162 lb (73.483 kg)  01/17/15 162 lb (73.483 kg)     BMI:  Body mass index is 29.01 kg/(m^2).  Estimated Nutritional Needs:   Kcal:  1850-2200kcals  Protein:  81-96g protein  Fluid:  >2L fluid  EDUCATION NEEDS:   No education needs identified at this time  Dwyane Luo, RD, LDN Pager [phone] Weekend/On-Call Pager [phone]

## 2015-07-13 ENCOUNTER — Inpatient Hospital Stay
Admission: AD | Admit: 2015-07-13 | Discharge: 2015-07-13 | Disposition: A | Discharge status: Home / Self Care | Payer: Medicare Other | Source: Ambulatory Visit | Attending: Internal Medicine | Admitting: Internal Medicine

## 2015-07-13 ENCOUNTER — Inpatient Hospital Stay: Payer: Medicare Other

## 2015-07-13 DIAGNOSIS — K5900 Constipation, unspecified: Secondary | ICD-10-CM

## 2015-07-13 DIAGNOSIS — J90 Pleural effusion, not elsewhere classified: Secondary | ICD-10-CM | POA: Insufficient documentation

## 2015-07-13 LAB — PROTIME-INR
INR: 1.01
PROTHROMBIN TIME: 13.5 s (ref 11.4–15.0)

## 2015-07-13 LAB — APTT: aPTT: 39 seconds — ABNORMAL HIGH (ref 24–36)

## 2015-07-13 LAB — GLUCOSE, CAPILLARY
GLUCOSE-CAPILLARY: 105 mg/dL — AB (ref 65–99)
GLUCOSE-CAPILLARY: 192 mg/dL — AB (ref 65–99)
GLUCOSE-CAPILLARY: 279 mg/dL — AB (ref 65–99)
Glucose-Capillary: 99 mg/dL (ref 65–99)
Glucose-Capillary: 80 mg/dL (ref 65–99)

## 2015-07-13 MED ORDER — VANCOMYCIN HCL IN DEXTROSE 1-5 GM/200ML-% IV SOLN
1000.0000 mg | INTRAVENOUS | Status: AC
  Administered 2015-07-14: 1000 mg via INTRAVENOUS
  Filled 2015-07-13: qty 200

## 2015-07-13 MED ORDER — MORPHINE SULFATE (PF) 2 MG/ML IV SOLN
2.0000 mg | INTRAVENOUS | Status: DC | PRN
  Administered 2015-07-13 – 2015-07-14 (×4): 2 mg via INTRAVENOUS
  Filled 2015-07-13 (×4): qty 1

## 2015-07-13 NOTE — Progress Notes (Signed)
Patient ID: Charlene Williams, female   DOB: 11-Sep-1955, 60 y.o.   MRN: [id number]  No chief complaint on file.   Referred By Dr. Ether Griffins  Reason for Referral Left pleural effusion  HPI Location, Quality, Duration, Severity, Timing, Context, Modifying Factors, Associated Signs and Symptoms.  Charlene Williams is a 60 y.o. female.  She states that about 6 weeks ago she began experiencing some vague left-sided chest pain associated with a feeling of being increasingly tired and weak. She presented to her primary care physician where she was diagnosed with pneumonia and placed on antibiotics. She thinks she may have improved slightly but over the course of the next week or 2 her symptoms began to worsen again or she complained of pain on her left side associated with some shortness of breath and again feeling of being extremely weak and tired. Another visit to her primary care physician confirmed a left-sided pleural effusion which was subsequently tapped. The results of that were made available to the patient during her most recent hospitalization which revealed metastatic adenocarcinoma. The patient was recovering at home from her thoracentesis and over the next 2 weeks again experienced increasing shortness of breath which was made worse with any exertion. She also thought she was having some more chronic left-sided chest wall pain. Ultimately she was brought into the emergency room where a chest x-ray and CT scan confirmed a very large left pleural effusion with complete compression of the left lung and evidence of metastatic disease within the pleural space. Of note is that the patient also has been complaining of some right arm pain and over the last 3 weeks has developed a mass over her proximal humerus on the lateral aspect just below the deltoid. This is firm and has been slowly increasing in size.   Past Medical History  Diagnosis Date  . Diabetes mellitus without complication ([place])   . Sleep  apnea   . Hypertension   . Aneurysm ([place])   . Hiatal hernia   . COPD (chronic obstructive pulmonary disease) ([place])   . Illicit drug use   . Migraines   . Hypercholesteremia   . Depression   . Spine disorder   . Family history of chronic pain 01/17/2015  . Cancer [facility])     Past Surgical History  Procedure Laterality Date  . Abdominal hysterectomy    . Hernia repair    . Eye surgery      Family History  Problem Relation Age of Onset  . Heart disease Mother     Social History Social History  Substance Use Topics  . Smoking status: Current Every Day Smoker -- 0.50 packs/day for 45 years    Types: Cigarettes  . Smokeless tobacco: None  . Alcohol Use: No    Allergies  Allergen Reactions  . Ampicillin Hives and Other (See Comments)    Has patient had a PCN reaction causing immediate rash, facial/tongue/throat swelling, SOB or lightheadedness with hypotension: No Has patient had a PCN reaction causing severe rash involving mucus membranes or skin necrosis: No Has patient had a PCN reaction that required hospitalization No Has patient had a PCN reaction occurring within the last 10 years: Yes If all of the above answers are "NO", then may proceed with Cephalosporin use.  Marland Kitchen Penicillins Hives and Other (See Comments)    Has patient had a PCN reaction causing immediate rash, facial/tongue/throat swelling, SOB or lightheadedness with hypotension: No Has patient had a PCN reaction causing severe rash  involving mucus membranes or skin necrosis: No Has patient had a PCN reaction that required hospitalization No Has patient had a PCN reaction occurring within the last 10 years: Yes If all of the above answers are "NO", then may proceed with Cephalosporin use.    Current Facility-Administered Medications  Medication Dose Route Frequency Provider Last Rate Last Dose  . acetaminophen (TYLENOL) tablet 650 mg  650 mg Oral Q6H PRN Henreitta Leber, MD       Or  . acetaminophen  (TYLENOL) suppository 650 mg  650 mg Rectal Q6H PRN Henreitta Leber, MD      . ALPRAZolam Duanne Moron) tablet 0.5 mg  0.5 mg Oral TID PRN Max Sane, MD   0.5 mg at 07/13/15 0930  . budesonide (PULMICORT) nebulizer solution 0.25 mg  0.25 mg Nebulization BID Henreitta Leber, MD   0.25 mg at 07/13/15 0751  . DULoxetine (CYMBALTA) DR capsule 90 mg  90 mg Oral Daily Henreitta Leber, MD   90 mg at 07/13/15 0929  . enalapril (VASOTEC) tablet 10 mg  10 mg Oral Daily Henreitta Leber, MD   10 mg at 07/13/15 0930  . enoxaparin (LOVENOX) injection 40 mg  40 mg Subcutaneous Q24H Henreitta Leber, MD   40 mg at 07/11/15 2024  . gabapentin (NEURONTIN) capsule 800 mg  800 mg Oral QID Henreitta Leber, MD   800 mg at 07/13/15 0930  . HYDROcodone-acetaminophen (NORCO/VICODIN) 5-325 MG per tablet 1 tablet  1 tablet Oral Q4H PRN Theodoro Grist, MD   1 tablet at 07/13/15 0338  . insulin aspart (novoLOG) injection 0-20 Units  0-20 Units Subcutaneous TID WC Henreitta Leber, MD   3 Units at 07/12/15 1230  . insulin aspart (novoLOG) injection 0-5 Units  0-5 Units Subcutaneous QHS Henreitta Leber, MD   0 Units at 07/11/15 2054  . ipratropium-albuterol (DUONEB) 0.5-2.5 (3) MG/3ML nebulizer solution 3 mL  3 mL Nebulization Q6H Henreitta Leber, MD   3 mL at 07/13/15 1300  . loratadine (CLARITIN) tablet 10 mg  10 mg Oral Daily Henreitta Leber, MD   10 mg at 07/13/15 0931  . magnesium oxide (MAG-OX) tablet 400 mg  400 mg Oral QHS Henreitta Leber, MD   400 mg at 07/12/15 2222  . metoprolol tartrate (LOPRESSOR) tablet 25 mg  25 mg Oral BID Henreitta Leber, MD   25 mg at 07/13/15 0930  . mirtazapine (REMERON) tablet 45 mg  45 mg Oral QHS Henreitta Leber, MD   45 mg at 07/12/15 2222  . morphine 2 MG/ML injection 1 mg  1 mg Intravenous Q3H PRN Henreitta Leber, MD   1 mg at 07/13/15 0931  . nicotine (NICODERM CQ - dosed in mg/24 hours) patch 21 mg  21 mg Transdermal Daily Henreitta Leber, MD   21 mg at 07/13/15 0931  . ondansetron (ZOFRAN)  tablet 4 mg  4 mg Oral Q6H PRN Henreitta Leber, MD       Or  . ondansetron (ZOFRAN) injection 4 mg  4 mg Intravenous Q6H PRN Henreitta Leber, MD      . pantoprazole (PROTONIX) EC tablet 40 mg  40 mg Oral Daily Henreitta Leber, MD   40 mg at 07/13/15 0930  . simvastatin (ZOCOR) tablet 20 mg  20 mg Oral QHS Henreitta Leber, MD   20 mg at 07/12/15 2222  . traZODone (DESYREL) tablet 150 mg  150 mg Oral  QHS Henreitta Leber, MD   150 mg at 07/12/15 2223  . [START ON 07/14/2015] vancomycin (VANCOCIN) IVPB 1000 mg/200 mL premix  1,000 mg Intravenous On Call to [place], MD          Review of Systems A complete review of systems was asked and was negative except for the following positive findingsShortness of breath, cough which has been mild, chest wall pain, loss of appetite, slight loss and weight, shortness of breath.  Blood pressure 111/69, pulse 79, temperature 98 F (36.7 C), temperature source Oral, resp. rate 24, height '5\' 3"'$  (1.6 m), weight 165 lb 8 oz (75.07 kg), SpO2 88 %.  Physical Exam CONSTITUTIONAL:  Pleasant, well-developed, well-nourished, and in no acute distress. EYES: Pupils equal and reactive to light, Sclera non-icteric EARS, NOSE, MOUTH AND THROAT:  The oropharynx was clear.  Dentition is in poor repair.  Oral mucosa pink and moist. LYMPH NODES:  Lymph nodes in the neck and axillae were normal RESPIRATORY:  Lungs were clear on the right and absent on the left.  Normal respiratory effort without pathologic use of accessory muscles of respiration CARDIOVASCULAR: Heart was regular without murmurs.  There were no carotid bruits. GI: The abdomen was soft, nontender, and nondistended. There were no palpable masses. There was no hepatosplenomegaly. There were normal bowel sounds in all quadrants. GU:  Rectal deferred.   MUSCULOSKELETAL:  Normal muscle strength and tone.  No clubbing or cyanosis.  She does have a fairly prominent 3-4 cm mass on the proximal humerus right  arm. SKIN:  There were no pathologic skin lesions.  There were no nodules on palpation. NEUROLOGIC:  Sensation is normal.  Cranial nerves are grossly intact. PSYCH:  Oriented to person, place and time.  Mood and affect are normal.  Data Reviewed Chest x-ray and CT  I have personally reviewed the patient's imaging, laboratory findings and medical records.    Assessment    Malignant pleural effusion left lung    Plan    I had a long discussion with the patient regarding the options for management of her malignant pleural effusion. The patient appears to be a reasonably good health and therefore I recommended that she undergo talc pleurodesis. I discussed her care today with Dr. Fabienne Bruns would like [place] to obtain additional sampling of the tumor and to place a Port-A-Cath. I explained all this to the patient. I explained her the indications and risks of left sided thoracoscopy with pleural biopsy and talc pleurodesis as well as Port-A-Cath insertion. The patient like [place] to proceed. Risks of bleeding, infection, air leak, death were all reviewed. All of her questions were answered.       Nestor Lewandowsky, MD 07/13/2015, 1:38 PM

## 2015-07-13 NOTE — Progress Notes (Signed)
Patient ID: Charlene Williams, female   DOB: June 23, 1955, 60 y.o.   MRN: [id number]   At 5:15 PM I was asked to place a left thoracostomy on this patient. She underwent a left thoracentesis this afternoon. Prior to the thoracentesis, a radiograph and CT chest demonstrated a large left pleural effusion with complete collapse of the left lung and shift of the mediastinum to the right. After the procedure, a follow up radiograph was performed. Her shift had resolved and the left lung became partially aerated. There was a pneumothorax (estimated 15%, with the rest of the hemithorax occupied by aerated lung and residual pleural fluid), which I believe is secondary to incomplete re-expansion of the left lung due to severe lung disease. The patient is reportedly not any more short of breath than pre-procedure. Her pulse ox saturations are 90 to 92%, and she is non-tachypneic. I attempted to contact Dr. Genevive Bi unsuccessfully and have discussed the case with Dr. Pat Patrick and Dr. Ether Griffins. I am recommending observation, rather than chest tube placement at this time unless there is worsening of the pneumothorax. I will instruct the attending RN to contact me immediately should the patient develop respiratory compromise. If so, an immediate chest xray can be performed to evaluate the need for emergent chest tube placement.

## 2015-07-13 NOTE — Procedures (Signed)
Successful [place] LT thoracentesis 1.8L removed Pleural fld bloody Post cxr shows mod LT hydroptx from incomplete re expansion Pt remains stable Dr. Clayton Bibles aware.

## 2015-07-13 NOTE — Care Management (Signed)
Admitted to [facility] with the diagnosis of COPD. Lives alone. Daughter, Nastashia Gallo [phone]) is planning on living with her post discharge. Sister is Lenell Antu [phone]). Last seen Kasandra Knudsen NP about 2 weeks ago. Discharged from this facility 06/27/15 with [facility] services. Uses a cane to aid in ambulation. Also has a back brace at home. Aide comes 80 hours every 2 weeks from [employer]. No Life Alert in the home. Takes care of all basic activities of daily living, needs help sometimes. Still drives, if needed. Fell Monday before last. Decreased appetite for a while has lost 15 pounds. CPAP in the home.  Daughter will transport. Received telephone call from [place] at [facility]. States that her company has been contacted that Ms. Manville may need oxygen. Ms. Hascall is currently not on oxygen. Scheduled for Thoracentesis today. Shelbie Ammons RN MSN CCM Care Management    [phone]

## 2015-07-13 NOTE — Progress Notes (Addendum)
[facility] [place] NAME: Charlene Williams    MR#:  [id number]  DATE OF BIRTH:  11-13-1955  SUBJECTIVE: I'm hurting in the right shoulder, Shoulder blade.  CHIEF COMPLAINT:  No chief complaint on file.  the patient is a 60 year old African-American female with past medical history significant for history of hypertension, hyperlipidemia, diabetes mellitus, COPD, not on oxygen at home, recent admission to the hospital and discharge on 06/27/2015 for left pleural effusion, left lower lobe pneumonia, SIRS on levofloxacin, who presents back to the hospital due to shortness of breath and left-sided chest pain, pleuritic in nature. She was seen by primary care physician and was sent back to the hospital for admission. Chest x-ray done in the emergency room revealed complete opacification of left hemithorax with mediastinal shift to the right, concerning for large pleural effusion. Adenocarcinoma was noted on cytology testing per pulmonologist, Dr. Ashby Dawes. Left thoracentesis was performed today, residual small pneumothorax was noted post thoracentesis, since lung did not expand well. Patient was seen prior to thoracentesis, she complains of no significant pain or shortness of breath, although admits of significant shortness of breath on exertion or laying down, less pain in the right shoulder, admits of having nodular area at right deltoid     Review of Systems  Constitutional: Negative for fever, chills and weight loss.  HENT: Negative for congestion.   Eyes: Negative for blurred vision and double vision.  Respiratory: Positive for shortness of breath. Negative for cough, sputum production and wheezing.   Cardiovascular: Positive for chest pain. Negative for palpitations, orthopnea, leg swelling and PND.  Gastrointestinal: Negative for nausea, vomiting, abdominal pain, diarrhea, constipation and blood in stool.  Genitourinary: Negative for dysuria,  urgency, frequency and hematuria.  Musculoskeletal: Negative for falls.  Neurological: Negative for dizziness, tremors, focal weakness and headaches.  Endo/Heme/Allergies: Does not bruise/bleed easily.  Psychiatric/Behavioral: Negative for depression. The patient does not have insomnia.     VITAL SIGNS: Blood pressure 111/69, pulse 79, temperature 98 F (36.7 C), temperature source Oral, resp. rate 24, height '5\' 3"'$  (1.6 m), weight 75.07 kg (165 lb 8 oz), SpO2 88 %.  PHYSICAL EXAMINATION:   GENERAL:  60 y.o.-year-old patient lying in the bed in mild respiratory distress, tachypneic, but comfortable whenever she does not move. Right deltoid area nodule/mass, nonmovable, hard to palpate of approximately 1 inch in diameter EYES: Pupils equal, round, reactive to light and accommodation. No scleral icterus. Extraocular muscles intact.  HEENT: Head atraumatic, normocephalic. Oropharynx and nasopharynx clear.  NECK:  Supple, no jugular venous distention. No thyroid enlargement, no tenderness.  LUNGS:Markedly diminished breath  Sounds on the left, dull to percussion, no wheezing,few rhonchi , no crepitations. Not use of accessory muscles of respiration. Chest is tender to palpation on the right side posteriorly. No bruising was noted. No swelling  CARDIOVASCULAR: S1, S2 normal. No murmurs, rubs, or gallops.  ABDOMEN: Soft, nontender, nondistended. Bowel sounds present. No organomegaly or mass.  EXTREMITIES: No pedal edema, cyanosis, or clubbing.  NEUROLOGIC: Cranial nerves II through XII are intact. Muscle strength 5/5 in all extremities. Sensation intact. Gait not checked.  PSYCHIATRIC: The patient is alert and oriented x 3  SKIN: No obvious rash, lesion, or ulcer.   ORDERS/RESULTS REVIEWED:   CBC  Recent Labs Lab 07/11/15 1700 07/12/15 0527  WBC 7.4 8.6  HGB 12.7 12.8  HCT 37.7 38.5  PLT 430 405  MCV 83.9 85.3  MCH 28.3 28.5  MCHC  33.8 33.4  RDW 14.8* 14.5    ------------------------------------------------------------------------------------------------------------------  Chemistries   Recent Labs Lab 07/11/15 1700 07/12/15 0527  NA 139 135  K 4.1 4.5  CL 106 103  CO2 25 24  GLUCOSE 97 145*  BUN 11 11  CREATININE 0.88 0.90  CALCIUM 9.3 9.1   ------------------------------------------------------------------------------------------------------------------ estimated creatinine clearance is 63.7 mL/min (by C-G formula based on Cr of 0.9). ------------------------------------------------------------------------------------------------------------------ No results for input(s): TSH, T4TOTAL, T3FREE, THYROIDAB in the last 72 hours.  Invalid input(s): FREET3  Cardiac Enzymes No results for input(s): CKMB, TROPONINI, MYOGLOBIN in the last 168 hours.  Invalid input(s): CK ------------------------------------------------------------------------------------------------------------------ Invalid input(s): POCBNP ---------------------------------------------------------------------------------------------------------------  RADIOLOGY: Dg Chest 1 View  07/11/2015  CLINICAL DATA:  Left-sided chest pain. EXAM: CHEST 1 VIEW COMPARISON:  June 27, 2015. FINDINGS: There is interval development of complete opacification of the left hemi thorax with mild mediastinal shift to the right. This is concerning for underlying effusion or possibly hemorrhage. Right lung is clear. No pneumothorax is noted. Bony thorax is unremarkable. IMPRESSION: Interval development of complete opacification of the left hemi thorax with mild mediastinal shift to the right. This is most consistent with large pleural effusion or possibly hemorrhage. CT scan is recommended for further evaluation. These results will be called to the ordering clinician or representative by the Radiologist Assistant, and communication documented in the PACS or zVision Dashboard. Electronically Signed    By: Marijo Conception, M.D.   On: 07/11/2015 17:42   Ct Chest W Contrast  07/12/2015  CLINICAL DATA:  60 year old female inpatient with large left pleural effusion demonstrating metastatic adenocarcinoma on 06/26/2015 left thoracentesis, presenting for staging. EXAM: CT CHEST, ABDOMEN, AND PELVIS WITH CONTRAST TECHNIQUE: Multidetector CT imaging of the chest, abdomen and pelvis was performed following the standard protocol during bolus administration of intravenous contrast. CONTRAST:  170m ISOVUE-300 IOPAMIDOL (ISOVUE-300) INJECTION 61% COMPARISON:  06/25/2015 chest CT. FINDINGS: CT CHEST Mediastinum/Nodes: Right mediastinal shift. Normal heart size. No pericardial fluid/thickening. Left anterior descending, left circumflex and right coronary atherosclerosis. Great vessels are normal in course and caliber. No central pulmonary emboli. Normal visualized thyroid. Normal esophagus. No axillary adenopathy. Mildly prominent left internal mammary nodes, largest 0.5 cm (series 2/ image 14). Mild right hilar adenopathy, largest 1.2 cm (series 2/ image 24). Mild left hilar adenopathy, largest 1.4 cm (series 2/ image 26). No additional pathologically enlarged mediastinal nodes. Lungs/Pleura: No pneumothorax. No right pleural effusion. Large malignant left pleural effusion is significantly increased since 06/25/2015, with irregular and slightly nodular pleural thickening and hyperenhancement throughout the mid to lower left pleural space. Complete left upper lobe and left lower lobe atelectasis. Suggestion of a heterogeneous 5.2 x 3.0 cm lung mass in the posterior left lower lobe (series 2/ image 34), which is poorly delineated given the complete left lung collapse. Medial right lower lobe 5 mm solid pulmonary nodule (series 5/image 116), stable since 06/25/2015 and minimally increased from 4 mm on 07/18/2012, probably benign. No additional significant pulmonary nodules in the aerated lungs. Mild centrilobular emphysema in  the aerated right lung. Musculoskeletal: Lytic osseous metastasis in the posterior right fifth rib. Moderate degenerative changes in the thoracic spine. CT ABDOMEN AND PELVIS Hepatobiliary: Hypodense 0.9 cm right liver lobe lesion (series 2/image 40), which was flash filling and unchanged in size on the 07/18/2012 CT study suggesting a benign lesion. No additional liver lesions. Normal gallbladder with no radiopaque cholelithiasis. No biliary ductal dilatation. Pancreas: Hypodense 0.7 cm lesion in the pancreatic body (series 2/ image  57), not definitely seen on 07/18/2012. Otherwise normal pancreas, with no main pancreatic duct dilation. Spleen: Normal size. No mass. Adrenals/Urinary Tract: Normal right adrenal. Left adrenal 1.4 cm nodule (series 2/ image 55), stable in size since 07/18/2012, most in keeping with a benign left adrenal adenoma. Subcentimeter hypodense renal cortical lesion in the posterior upper right kidney, too small to characterize, not definitely changed since 07/18/2012, suggesting a benign renal cyst. Simple 2.5 cm renal cyst in the lower left kidney. No hydronephrosis. Normal bladder. Stomach/Bowel: Grossly normal stomach. Normal caliber small bowel with no small bowel wall thickening. Normal appendix. Mild distal colonic diverticulosis, with no large bowel wall thickening or pericolonic fat stranding. Oral contrast progresses to the splenic flexure of the colon. Vascular/Lymphatic: Atherosclerotic nonaneurysmal abdominal aorta. Patent portal, splenic, hepatic and renal veins. No pathologically enlarged lymph nodes in the abdomen or pelvis. Reproductive: Status post hysterectomy, with no abnormal findings at the vaginal cuff. No adnexal mass. Other: No pneumoperitoneum, ascites or focal fluid collection. Musculoskeletal: No aggressive appearing focal osseous lesions. Mild degenerative changes in the lumbar spine. IMPRESSION: 1. Large malignant left pleural effusion, significantly increased in  size since 06/25/2015, with enhancing irregular nodular pleural thickening throughout the mid to lower left pleural space. 2. Suggestion of a posterior left lower lobe 5.2 cm lung mass, poorly delineated given the complete left lung collapse, and suspected to represent a primary bronchogenic carcinoma. 3. Bilateral hilar and left internal mammary lymphadenopathy, suspicious for nodal metastases. 4. Posterior right fifth rib lytic osseous metastasis. 5. Subcentimeter low-density lesion in the pancreatic body, new since 07/18/2012, indeterminate, differential includes cystic pancreatic lesion or pancreatic metastasis. If clinically warranted, further evaluation with pancreas protocol MRI abdomen without and with IV contrast could be performed. 6. No additional potential sites of metastatic disease in the abdomen or pelvis. Low-density subcentimeter right liver lobe lesion and left adrenal nodule are stable since 07/18/2012 and probably benign. 7. Additional findings include three-vessel coronary atherosclerosis, mild emphysema and mild distal colonic diverticulosis. Electronically Signed   By: Ilona Sorrel M.D.   On: 07/12/2015 16:50   Ct Abdomen Pelvis W Contrast  07/12/2015  CLINICAL DATA:  60 year old female inpatient with large left pleural effusion demonstrating metastatic adenocarcinoma on 06/26/2015 left thoracentesis, presenting for staging. EXAM: CT CHEST, ABDOMEN, AND PELVIS WITH CONTRAST TECHNIQUE: Multidetector CT imaging of the chest, abdomen and pelvis was performed following the standard protocol during bolus administration of intravenous contrast. CONTRAST:  157m ISOVUE-300 IOPAMIDOL (ISOVUE-300) INJECTION 61% COMPARISON:  06/25/2015 chest CT. FINDINGS: CT CHEST Mediastinum/Nodes: Right mediastinal shift. Normal heart size. No pericardial fluid/thickening. Left anterior descending, left circumflex and right coronary atherosclerosis. Great vessels are normal in course and caliber. No central  pulmonary emboli. Normal visualized thyroid. Normal esophagus. No axillary adenopathy. Mildly prominent left internal mammary nodes, largest 0.5 cm (series 2/ image 14). Mild right hilar adenopathy, largest 1.2 cm (series 2/ image 24). Mild left hilar adenopathy, largest 1.4 cm (series 2/ image 26). No additional pathologically enlarged mediastinal nodes. Lungs/Pleura: No pneumothorax. No right pleural effusion. Large malignant left pleural effusion is significantly increased since 06/25/2015, with irregular and slightly nodular pleural thickening and hyperenhancement throughout the mid to lower left pleural space. Complete left upper lobe and left lower lobe atelectasis. Suggestion of a heterogeneous 5.2 x 3.0 cm lung mass in the posterior left lower lobe (series 2/ image 34), which is poorly delineated given the complete left lung collapse. Medial right lower lobe 5 mm solid pulmonary nodule (series 5/image  116), stable since 06/25/2015 and minimally increased from 4 mm on 07/18/2012, probably benign. No additional significant pulmonary nodules in the aerated lungs. Mild centrilobular emphysema in the aerated right lung. Musculoskeletal: Lytic osseous metastasis in the posterior right fifth rib. Moderate degenerative changes in the thoracic spine. CT ABDOMEN AND PELVIS Hepatobiliary: Hypodense 0.9 cm right liver lobe lesion (series 2/image 40), which was flash filling and unchanged in size on the 07/18/2012 CT study suggesting a benign lesion. No additional liver lesions. Normal gallbladder with no radiopaque cholelithiasis. No biliary ductal dilatation. Pancreas: Hypodense 0.7 cm lesion in the pancreatic body (series 2/ image 57), not definitely seen on 07/18/2012. Otherwise normal pancreas, with no main pancreatic duct dilation. Spleen: Normal size. No mass. Adrenals/Urinary Tract: Normal right adrenal. Left adrenal 1.4 cm nodule (series 2/ image 55), stable in size since 07/18/2012, most in keeping with a  benign left adrenal adenoma. Subcentimeter hypodense renal cortical lesion in the posterior upper right kidney, too small to characterize, not definitely changed since 07/18/2012, suggesting a benign renal cyst. Simple 2.5 cm renal cyst in the lower left kidney. No hydronephrosis. Normal bladder. Stomach/Bowel: Grossly normal stomach. Normal caliber small bowel with no small bowel wall thickening. Normal appendix. Mild distal colonic diverticulosis, with no large bowel wall thickening or pericolonic fat stranding. Oral contrast progresses to the splenic flexure of the colon. Vascular/Lymphatic: Atherosclerotic nonaneurysmal abdominal aorta. Patent portal, splenic, hepatic and renal veins. No pathologically enlarged lymph nodes in the abdomen or pelvis. Reproductive: Status post hysterectomy, with no abnormal findings at the vaginal cuff. No adnexal mass. Other: No pneumoperitoneum, ascites or focal fluid collection. Musculoskeletal: No aggressive appearing focal osseous lesions. Mild degenerative changes in the lumbar spine. IMPRESSION: 1. Large malignant left pleural effusion, significantly increased in size since 06/25/2015, with enhancing irregular nodular pleural thickening throughout the mid to lower left pleural space. 2. Suggestion of a posterior left lower lobe 5.2 cm lung mass, poorly delineated given the complete left lung collapse, and suspected to represent a primary bronchogenic carcinoma. 3. Bilateral hilar and left internal mammary lymphadenopathy, suspicious for nodal metastases. 4. Posterior right fifth rib lytic osseous metastasis. 5. Subcentimeter low-density lesion in the pancreatic body, new since 07/18/2012, indeterminate, differential includes cystic pancreatic lesion or pancreatic metastasis. If clinically warranted, further evaluation with pancreas protocol MRI abdomen without and with IV contrast could be performed. 6. No additional potential sites of metastatic disease in the abdomen or  pelvis. Low-density subcentimeter right liver lobe lesion and left adrenal nodule are stable since 07/18/2012 and probably benign. 7. Additional findings include three-vessel coronary atherosclerosis, mild emphysema and mild distal colonic diverticulosis. Electronically Signed   By: Ilona Sorrel M.D.   On: 07/12/2015 16:50    EKG:  Orders placed or performed during the hospital encounter of 07/11/15  . EKG 12 lead  . EKG 12 lead    ASSESSMENT AND PLAN:  Active Problems:   COPD exacerbation ([place]) #1. Malignant pleural effusion due to adenocarcinoma of unclear source, appreciate oncologist input, CT scan of the chest and abdomen revealed nodular pleural thickening, questionable left lower lobe mass, patient underwent left thoracentesis 07/13/2015, thoracic surgeon, Dr. Genevive Bi saw patient in consultation and recommended talc pleurodesis, pleural biopsy, Port-A-Cath insertion during left-sided thoracoscopy procedure, which will likely be scheduled for tomorrow. #2 . Adjustment reaction, supportive therapy, Xanax as needed, better overall clinically,  patient is on Remeron #3. Essential hypertension, well controlled #4. Diabetes mellitus, continue diabetic diet, sliding scale insulin, blood glucose levels are  high because of stress, off steroids. #5. COPD , no obvious exacerbation, off steroids, continue DuoNeb and Pulmicort inhalation therapy, stable, continue oxygen therapy as patient's O2 sats were 85 to 88% on room air #6. LLL mass , no obvious pneumonia on CT, now off antibiotic therapy  Management plans discussed with the patient, family and they are in agreement.   DRUG ALLERGIES:  Allergies  Allergen Reactions  . Ampicillin Hives and Other (See Comments)    Has patient had a PCN reaction causing immediate rash, facial/tongue/throat swelling, SOB or lightheadedness with hypotension: No Has patient had a PCN reaction causing severe rash involving mucus membranes or skin necrosis: No Has  patient had a PCN reaction that required hospitalization No Has patient had a PCN reaction occurring within the last 10 years: Yes If all of the above answers are "NO", then may proceed with Cephalosporin use.  Marland Kitchen Penicillins Hives and Other (See Comments)    Has patient had a PCN reaction causing immediate rash, facial/tongue/throat swelling, SOB or lightheadedness with hypotension: No Has patient had a PCN reaction causing severe rash involving mucus membranes or skin necrosis: No Has patient had a PCN reaction that required hospitalization No Has patient had a PCN reaction occurring within the last 10 years: Yes If all of the above answers are "NO", then may proceed with Cephalosporin use.    CODE STATUS:     Code Status Orders        Start     Ordered   07/11/15 1725  Full code   Continuous     07/11/15 1725    Code Status History    Date Active Date Inactive Code Status Order ID Comments User Context   06/25/2015  6:35 PM 06/27/2015  4:00 PM Full Code [id number]  Theodoro Grist, MD ED      TOTAL TIME TAKING CARE OF THIS PATIENT: 55 minutes.   About 20 minute discussion with Dr. Genevive Bi and Dr. Rogue Bussing, dr Reesa Chew and DR [facility] in regards to patient's left thoracentesis, resultant pneumothorax and chest tube placement issues  Nil Bolser M.D on 07/13/2015 at 1:52 PM  Between 7am to 6pm - Pager - [phone]  After 6pm go to [email or web address] - password [email or web address] [facility]  [facility]  Office  [phone]  CC: Primary care physician; Kasandra Knudsen, NP

## 2015-07-13 NOTE — Plan of Care (Signed)
Problem: Pain Managment: Goal: General experience of comfort will improve Outcome: Progressing Morphine '1mg'$  times 3 doses Vicodin 1 tablet during shift Xanax times 2 doses during shift     Problem: Nutrition: Goal: Adequate nutrition will be maintained Outcome: Progressing Encouraged Nutrition

## 2015-07-13 NOTE — Progress Notes (Signed)
Charlene Williams   DOB:Jul 24, 1955   KV#:[id number]    Subjective:  Overnight no acute events. Patient continues to complain of significant difficulty breathing. And cough.  No nausea no vomiting.  Continues to complain of pain in the left shoulder area/ Lateral chest wall.  ROS:   Constipation; no diarrhea.   Objective:  Filed Vitals:   07/13/15 1206 07/13/15 1244  BP: 109/65 111/69  Pulse: 88 79  Temp:  98 F (36.7 C)  Resp: 20 24    No intake or output data in the 24 hours ending 07/13/15 1536  GENERAL: Well-nourished well-developed; Alert, no distress and comfortable. With daughter/ grandson,; Anxious.  EYES: no pallor or icterus OROPHARYNX: no thrush or ulceration; good dentition  NECK: supple, no masses felt LYMPH: no palpable lymphadenopathy in the cervical, axillary or inguinal regions LUNGS: Diminished breath sounds on the left lung ; No wheeze or crackles HEART/CVS: regular rate & rhythm and no murmurs; No lower extremity edema ABDOMEN: abdomen soft, non-tender and normal bowel sounds; distended. No hepatosplenomegaly. Musculoskeletal:no cyanosis of digits and no clubbing  PSYCH: alert & oriented x 3 with fluent speech NEURO: no focal motor/sensory deficits SKIN: no rashes or significant lesions.    Labs:  Lab Results  Component Value Date   WBC 8.6 07/12/2015   HGB 12.8 07/12/2015   HCT 38.5 07/12/2015   MCV 85.3 07/12/2015   PLT 405 07/12/2015   NEUTROABS 1.8 07/23/2012    Lab Results  Component Value Date   NA 135 07/12/2015   K 4.5 07/12/2015   CL 103 07/12/2015   CO2 24 07/12/2015    Studies:  Dg Chest 1 View  07/11/2015  CLINICAL DATA:  Left-sided chest pain. EXAM: CHEST 1 VIEW COMPARISON:  June 27, 2015. FINDINGS: There is interval development of complete opacification of the left hemi thorax with mild mediastinal shift to the right. This is concerning for underlying effusion or possibly hemorrhage. Right lung is clear. No pneumothorax is noted.  Bony thorax is unremarkable. IMPRESSION: Interval development of complete opacification of the left hemi thorax with mild mediastinal shift to the right. This is most consistent with large pleural effusion or possibly hemorrhage. CT scan is recommended for further evaluation. These results will be called to the ordering clinician or representative by the Radiologist Assistant, and communication documented in the PACS or zVision Dashboard. Electronically Signed   By: Marijo Conception, M.D.   On: 07/11/2015 17:42   Ct Chest W Contrast  07/12/2015  CLINICAL DATA:  60 year old female inpatient with large left pleural effusion demonstrating metastatic adenocarcinoma on 06/26/2015 left thoracentesis, presenting for staging. EXAM: CT CHEST, ABDOMEN, AND PELVIS WITH CONTRAST TECHNIQUE: Multidetector CT imaging of the chest, abdomen and pelvis was performed following the standard protocol during bolus administration of intravenous contrast. CONTRAST:  165m ISOVUE-300 IOPAMIDOL (ISOVUE-300) INJECTION 61% COMPARISON:  06/25/2015 chest CT. FINDINGS: CT CHEST Mediastinum/Nodes: Right mediastinal shift. Normal heart size. No pericardial fluid/thickening. Left anterior descending, left circumflex and right coronary atherosclerosis. Great vessels are normal in course and caliber. No central pulmonary emboli. Normal visualized thyroid. Normal esophagus. No axillary adenopathy. Mildly prominent left internal mammary nodes, largest 0.5 cm (series 2/ image 14). Mild right hilar adenopathy, largest 1.2 cm (series 2/ image 24). Mild left hilar adenopathy, largest 1.4 cm (series 2/ image 26). No additional pathologically enlarged mediastinal nodes. Lungs/Pleura: No pneumothorax. No right pleural effusion. Large malignant left pleural effusion is significantly increased since 06/25/2015, with irregular and slightly nodular pleural  thickening and hyperenhancement throughout the mid to lower left pleural space. Complete left upper lobe  and left lower lobe atelectasis. Suggestion of a heterogeneous 5.2 x 3.0 cm lung mass in the posterior left lower lobe (series 2/ image 34), which is poorly delineated given the complete left lung collapse. Medial right lower lobe 5 mm solid pulmonary nodule (series 5/image 116), stable since 06/25/2015 and minimally increased from 4 mm on 07/18/2012, probably benign. No additional significant pulmonary nodules in the aerated lungs. Mild centrilobular emphysema in the aerated right lung. Musculoskeletal: Lytic osseous metastasis in the posterior right fifth rib. Moderate degenerative changes in the thoracic spine. CT ABDOMEN AND PELVIS Hepatobiliary: Hypodense 0.9 cm right liver lobe lesion (series 2/image 40), which was flash filling and unchanged in size on the 07/18/2012 CT study suggesting a benign lesion. No additional liver lesions. Normal gallbladder with no radiopaque cholelithiasis. No biliary ductal dilatation. Pancreas: Hypodense 0.7 cm lesion in the pancreatic body (series 2/ image 57), not definitely seen on 07/18/2012. Otherwise normal pancreas, with no main pancreatic duct dilation. Spleen: Normal size. No mass. Adrenals/Urinary Tract: Normal right adrenal. Left adrenal 1.4 cm nodule (series 2/ image 55), stable in size since 07/18/2012, most in keeping with a benign left adrenal adenoma. Subcentimeter hypodense renal cortical lesion in the posterior upper right kidney, too small to characterize, not definitely changed since 07/18/2012, suggesting a benign renal cyst. Simple 2.5 cm renal cyst in the lower left kidney. No hydronephrosis. Normal bladder. Stomach/Bowel: Grossly normal stomach. Normal caliber small bowel with no small bowel wall thickening. Normal appendix. Mild distal colonic diverticulosis, with no large bowel wall thickening or pericolonic fat stranding. Oral contrast progresses to the splenic flexure of the colon. Vascular/Lymphatic: Atherosclerotic nonaneurysmal abdominal aorta.  Patent portal, splenic, hepatic and renal veins. No pathologically enlarged lymph nodes in the abdomen or pelvis. Reproductive: Status post hysterectomy, with no abnormal findings at the vaginal cuff. No adnexal mass. Other: No pneumoperitoneum, ascites or focal fluid collection. Musculoskeletal: No aggressive appearing focal osseous lesions. Mild degenerative changes in the lumbar spine. IMPRESSION: 1. Large malignant left pleural effusion, significantly increased in size since 06/25/2015, with enhancing irregular nodular pleural thickening throughout the mid to lower left pleural space. 2. Suggestion of a posterior left lower lobe 5.2 cm lung mass, poorly delineated given the complete left lung collapse, and suspected to represent a primary bronchogenic carcinoma. 3. Bilateral hilar and left internal mammary lymphadenopathy, suspicious for nodal metastases. 4. Posterior right fifth rib lytic osseous metastasis. 5. Subcentimeter low-density lesion in the pancreatic body, new since 07/18/2012, indeterminate, differential includes cystic pancreatic lesion or pancreatic metastasis. If clinically warranted, further evaluation with pancreas protocol MRI abdomen without and with IV contrast could be performed. 6. No additional potential sites of metastatic disease in the abdomen or pelvis. Low-density subcentimeter right liver lobe lesion and left adrenal nodule are stable since 07/18/2012 and probably benign. 7. Additional findings include three-vessel coronary atherosclerosis, mild emphysema and mild distal colonic diverticulosis. Electronically Signed   By: Ilona Sorrel M.D.   On: 07/12/2015 16:50   Ct Abdomen Pelvis W Contrast  07/12/2015  CLINICAL DATA:  60 year old female inpatient with large left pleural effusion demonstrating metastatic adenocarcinoma on 06/26/2015 left thoracentesis, presenting for staging. EXAM: CT CHEST, ABDOMEN, AND PELVIS WITH CONTRAST TECHNIQUE: Multidetector CT imaging of the chest,  abdomen and pelvis was performed following the standard protocol during bolus administration of intravenous contrast. CONTRAST:  148m ISOVUE-300 IOPAMIDOL (ISOVUE-300) INJECTION 61% COMPARISON:  06/25/2015 chest  CT. FINDINGS: CT CHEST Mediastinum/Nodes: Right mediastinal shift. Normal heart size. No pericardial fluid/thickening. Left anterior descending, left circumflex and right coronary atherosclerosis. Great vessels are normal in course and caliber. No central pulmonary emboli. Normal visualized thyroid. Normal esophagus. No axillary adenopathy. Mildly prominent left internal mammary nodes, largest 0.5 cm (series 2/ image 14). Mild right hilar adenopathy, largest 1.2 cm (series 2/ image 24). Mild left hilar adenopathy, largest 1.4 cm (series 2/ image 26). No additional pathologically enlarged mediastinal nodes. Lungs/Pleura: No pneumothorax. No right pleural effusion. Large malignant left pleural effusion is significantly increased since 06/25/2015, with irregular and slightly nodular pleural thickening and hyperenhancement throughout the mid to lower left pleural space. Complete left upper lobe and left lower lobe atelectasis. Suggestion of a heterogeneous 5.2 x 3.0 cm lung mass in the posterior left lower lobe (series 2/ image 34), which is poorly delineated given the complete left lung collapse. Medial right lower lobe 5 mm solid pulmonary nodule (series 5/image 116), stable since 06/25/2015 and minimally increased from 4 mm on 07/18/2012, probably benign. No additional significant pulmonary nodules in the aerated lungs. Mild centrilobular emphysema in the aerated right lung. Musculoskeletal: Lytic osseous metastasis in the posterior right fifth rib. Moderate degenerative changes in the thoracic spine. CT ABDOMEN AND PELVIS Hepatobiliary: Hypodense 0.9 cm right liver lobe lesion (series 2/image 40), which was flash filling and unchanged in size on the 07/18/2012 CT study suggesting a benign lesion. No  additional liver lesions. Normal gallbladder with no radiopaque cholelithiasis. No biliary ductal dilatation. Pancreas: Hypodense 0.7 cm lesion in the pancreatic body (series 2/ image 57), not definitely seen on 07/18/2012. Otherwise normal pancreas, with no main pancreatic duct dilation. Spleen: Normal size. No mass. Adrenals/Urinary Tract: Normal right adrenal. Left adrenal 1.4 cm nodule (series 2/ image 55), stable in size since 07/18/2012, most in keeping with a benign left adrenal adenoma. Subcentimeter hypodense renal cortical lesion in the posterior upper right kidney, too small to characterize, not definitely changed since 07/18/2012, suggesting a benign renal cyst. Simple 2.5 cm renal cyst in the lower left kidney. No hydronephrosis. Normal bladder. Stomach/Bowel: Grossly normal stomach. Normal caliber small bowel with no small bowel wall thickening. Normal appendix. Mild distal colonic diverticulosis, with no large bowel wall thickening or pericolonic fat stranding. Oral contrast progresses to the splenic flexure of the colon. Vascular/Lymphatic: Atherosclerotic nonaneurysmal abdominal aorta. Patent portal, splenic, hepatic and renal veins. No pathologically enlarged lymph nodes in the abdomen or pelvis. Reproductive: Status post hysterectomy, with no abnormal findings at the vaginal cuff. No adnexal mass. Other: No pneumoperitoneum, ascites or focal fluid collection. Musculoskeletal: No aggressive appearing focal osseous lesions. Mild degenerative changes in the lumbar spine. IMPRESSION: 1. Large malignant left pleural effusion, significantly increased in size since 06/25/2015, with enhancing irregular nodular pleural thickening throughout the mid to lower left pleural space. 2. Suggestion of a posterior left lower lobe 5.2 cm lung mass, poorly delineated given the complete left lung collapse, and suspected to represent a primary bronchogenic carcinoma. 3. Bilateral hilar and left internal mammary  lymphadenopathy, suspicious for nodal metastases. 4. Posterior right fifth rib lytic osseous metastasis. 5. Subcentimeter low-density lesion in the pancreatic body, new since 07/18/2012, indeterminate, differential includes cystic pancreatic lesion or pancreatic metastasis. If clinically warranted, further evaluation with pancreas protocol MRI abdomen without and with IV contrast could be performed. 6. No additional potential sites of metastatic disease in the abdomen or pelvis. Low-density subcentimeter right liver lobe lesion and left adrenal nodule are stable  since 07/18/2012 and probably benign. 7. Additional findings include three-vessel coronary atherosclerosis, mild emphysema and mild distal colonic diverticulosis. Electronically Signed   By: Ilona Sorrel M.D.   On: 07/12/2015 16:50   Dg Chest Port 1 View  07/13/2015  CLINICAL DATA:  Status post left thoracentesis for recurrent malignant effusion EXAM: PORTABLE CHEST 1 VIEW COMPARISON:  07/11/2015 FINDINGS: Large amount of the left effusion has been removed. Patient's previous chest x-ray demonstrated complete opacification of the left hemi thorax. Following the thoracentesis, the patient has a moderate residual left hydro pneumothorax with pleural thickening of the lung related to incomplete re-expansion of the lung. Trachea is midline. Right lung remains clear. Residual lingula and left lower lobe collapse/ consolidation. Stable heart size and mediastinal contours. Atherosclerosis of the aorta evident. IMPRESSION: moderate-sized left hydro pneumothorax following large volume left thoracentesis compatible with incomplete re-expansion of the lung. Patient remains asymptomatic. These results were called by telephone at the time of interpretation on 07/13/2015 at 2:03 pm to Dr. Theodoro Grist , who verbally acknowledged these results. Electronically Signed   By: Jerilynn Mages.  Shick M.D.   On: 07/13/2015 14:04   US Thoracentesis Asp Pleural Space W/img  Guide  07/13/2015  INDICATION: Recurrent malignant left pleural effusion, shortness of breath EXAM: ULTRASOUND GUIDED LEFT THORACENTESIS MEDICATIONS: 1% lidocaine locally COMPLICATIONS: None immediate. PROCEDURE: An ultrasound guided thoracentesis was thoroughly discussed with the patient and questions answered. The benefits, risks, alternatives and complications were also discussed. The patient understands and wishes to proceed with the procedure. Written consent was obtained. Ultrasound was performed to localize and mark an adequate pocket of fluid in the left chest. The area was then prepped and draped in the normal sterile fashion. 1% Lidocaine was used for local anesthesia. Under ultrasound guidance a 6 Fr Safe-T-Centesis catheter was introduced. Thoracentesis was performed. The catheter was removed and a dressing applied. FINDINGS: A total of approximately 1.8 L of bloody pleural fluid was removed. Samples were sent to the laboratory as requested by the clinical team. IMPRESSION: Successful ultrasound guided left thoracentesis yielding 1.8 L of pleural fluid. Electronically Signed   By: Jerilynn Mages.  Shick M.D.   On: 07/13/2015 13:57    Assessment & Plan:   60 year old female patient with history of smoking with a recurrent left-sided pleural effusion- cytology positive for adenocarcinoma.  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 Likely suggestive of lung origin. Pleurodesis- likely going to help temporize the fluid buildup.  Given the symptomatic effusion she'll benefit from thoracentesis today.  # Stage IV adenocarcinoma the lung-  Plan outpatient Palliative chemotherapy.  #  Discuss with Dr. Faith Rogue;  Plan  For pleurodesis in the morning.  Also  Get a pleural biopsy.  Mediport placement.   Cammie Sickle, MD 07/13/2015  3:36 PM

## 2015-07-13 NOTE — Care Management Important Message (Signed)
Important Message  Patient Details  Name: Charlene Williams MRN: [id number] Date of Birth: 04-23-1955   Medicare Important Message Given:  Yes    Juliann Pulse A Jaret Coppedge 07/13/2015, 11:19 AM

## 2015-07-14 ENCOUNTER — Inpatient Hospital Stay: Payer: Medicare Other

## 2015-07-14 ENCOUNTER — Encounter
Admission: AD | Disposition: A | Discharge status: Home Health | Payer: Self-pay | Source: Ambulatory Visit | Attending: Specialist

## 2015-07-14 ENCOUNTER — Encounter: Payer: Self-pay | Admitting: Internal Medicine

## 2015-07-14 ENCOUNTER — Encounter: Payer: Self-pay | Admitting: *Deleted

## 2015-07-14 ENCOUNTER — Inpatient Hospital Stay: Payer: Medicare Other | Admitting: Anesthesiology

## 2015-07-14 DIAGNOSIS — J90 Pleural effusion, not elsewhere classified: Secondary | ICD-10-CM | POA: Insufficient documentation

## 2015-07-14 HISTORY — PX: PORTACATH PLACEMENT: SHX2246

## 2015-07-14 HISTORY — PX: VIDEO ASSISTED THORACOSCOPY (VATS)/THOROCOTOMY: SHX6173

## 2015-07-14 LAB — BASIC METABOLIC PANEL
ANION GAP: 5 (ref 5–15)
BUN: 11 mg/dL (ref 6–20)
CO2: 26 mmol/L (ref 22–32)
Calcium: 8.7 mg/dL — ABNORMAL LOW (ref 8.9–10.3)
Chloride: 106 mmol/L (ref 101–111)
Creatinine, Ser: 0.9 mg/dL (ref 0.44–1.00)
GFR calc Af Amer: >60 mL/min (ref >60)
GFR calc non Af Amer: >60 mL/min (ref >60)
Glucose, Bld: 161 mg/dL — ABNORMAL HIGH (ref 65–99)
POTASSIUM: 4.8 mmol/L (ref 3.5–5.1)
Sodium: 137 mmol/L (ref 135–145)

## 2015-07-14 LAB — CBC
HEMATOCRIT: 42.1 % (ref 35.0–47.0)
Hemoglobin: 13.5 g/dL (ref 12.0–16.0)
MCH: 27.5 pg (ref 26.0–34.0)
MCHC: 32.1 g/dL (ref 32.0–36.0)
MCV: 85.7 fL (ref 80.0–100.0)
Platelets: 388 10*3/uL (ref 150–440)
RBC: 4.91 MIL/uL (ref 3.80–5.20)
RDW: 14.5 % (ref 11.5–14.5)
WBC: 11 10*3/uL (ref 3.6–11.0)

## 2015-07-14 LAB — GLUCOSE, CAPILLARY
GLUCOSE-CAPILLARY: 127 mg/dL — AB (ref 65–99)
Glucose-Capillary: 89 mg/dL (ref 65–99)
Glucose-Capillary: 125 mg/dL — ABNORMAL HIGH (ref 65–99)
Glucose-Capillary: 155 mg/dL — ABNORMAL HIGH (ref 65–99)
Glucose-Capillary: 338 mg/dL — ABNORMAL HIGH (ref 65–99)

## 2015-07-14 SURGERY — VIDEO ASSISTED THORACOSCOPY (VATS)/THOROCOTOMY
Anesthesia: General | Laterality: Left | Wound class: Clean Contaminated

## 2015-07-14 MED ORDER — DEXAMETHASONE SODIUM PHOSPHATE 10 MG/ML IJ SOLN
INTRAMUSCULAR | Status: DC | PRN
  Administered 2015-07-14: 10 mg via INTRAVENOUS

## 2015-07-14 MED ORDER — PROPOFOL 10 MG/ML IV BOLUS
INTRAVENOUS | Status: DC | PRN
  Administered 2015-07-14: 30 mg via INTRAVENOUS
  Administered 2015-07-14: 120 mg via INTRAVENOUS
  Administered 2015-07-14: 50 mg via INTRAVENOUS

## 2015-07-14 MED ORDER — KETAMINE HCL 100 MG/ML IJ SOLN
INTRAMUSCULAR | Status: DC | PRN
  Administered 2015-07-14: 50 mg via INTRAVENOUS

## 2015-07-14 MED ORDER — MIDAZOLAM HCL 5 MG/5ML IJ SOLN
INTRAMUSCULAR | Status: DC | PRN
  Administered 2015-07-14: 2 mg via INTRAVENOUS

## 2015-07-14 MED ORDER — SODIUM CHLORIDE 0.9 % IV SOLN
INTRAVENOUS | Status: DC | PRN
  Administered 2015-07-14: 20 mL via INTRAMUSCULAR

## 2015-07-14 MED ORDER — ALBUTEROL SULFATE (2.5 MG/3ML) 0.083% IN NEBU
2.5000 mg | INHALATION_SOLUTION | Freq: Four times a day (QID) | RESPIRATORY_TRACT | Status: DC
  Administered 2015-07-14 – 2015-07-19 (×18): 2.5 mg via RESPIRATORY_TRACT
  Filled 2015-07-14 (×20): qty 3

## 2015-07-14 MED ORDER — HYDROMORPHONE HCL 1 MG/ML IJ SOLN
INTRAMUSCULAR | Status: AC
  Administered 2015-07-14: 16:00:00
  Filled 2015-07-14: qty 1

## 2015-07-14 MED ORDER — HYDROMORPHONE HCL 1 MG/ML IJ SOLN
0.5000 mg | INTRAMUSCULAR | Status: DC | PRN
  Administered 2015-07-14 – 2015-07-15 (×2): 0.5 mg via INTRAVENOUS
  Filled 2015-07-14 (×2): qty 1

## 2015-07-14 MED ORDER — DEXTROSE IN LACTATED RINGERS 5 % IV SOLN
INTRAVENOUS | Status: DC
  Administered 2015-07-14: 08:00:00 via INTRAVENOUS

## 2015-07-14 MED ORDER — TALC 5 G PL SUSR
INTRAPLEURAL | Status: AC
  Filled 2015-07-14: qty 5

## 2015-07-14 MED ORDER — HYDROMORPHONE HCL 1 MG/ML IJ SOLN
0.2500 mg | INTRAMUSCULAR | Status: DC | PRN
  Administered 2015-07-14 (×4): 0.5 mg via INTRAVENOUS

## 2015-07-14 MED ORDER — EPHEDRINE SULFATE 50 MG/ML IJ SOLN
INTRAMUSCULAR | Status: DC | PRN
  Administered 2015-07-14: 10 mg via INTRAVENOUS

## 2015-07-14 MED ORDER — FENTANYL CITRATE (PF) 100 MCG/2ML IJ SOLN
INTRAMUSCULAR | Status: AC
  Administered 2015-07-14: 100 ug
  Administered 2015-07-14 (×3): 50 ug via INTRAVENOUS
  Administered 2015-07-14: 100 ug via INTRAVENOUS
  Filled 2015-07-14: qty 2

## 2015-07-14 MED ORDER — METOCLOPRAMIDE HCL 5 MG/ML IJ SOLN: 10.0000 mg | Freq: Once | INTRAMUSCULAR | Status: DC | PRN

## 2015-07-14 MED ORDER — PHENYLEPHRINE HCL 10 MG/ML IJ SOLN
INTRAMUSCULAR | Status: DC | PRN
  Administered 2015-07-14 (×4): 150 ug via INTRAVENOUS

## 2015-07-14 MED ORDER — HEPARIN SODIUM (PORCINE) 5000 UNIT/ML IJ SOLN
INTRAMUSCULAR | Status: AC
  Filled 2015-07-14: qty 1

## 2015-07-14 MED ORDER — MINERAL OIL LIGHT 100 % EX OIL
TOPICAL_OIL | CUTANEOUS | Status: AC
  Filled 2015-07-14: qty 25

## 2015-07-14 MED ORDER — SUCCINYLCHOLINE CHLORIDE 20 MG/ML IJ SOLN
INTRAMUSCULAR | Status: DC | PRN
  Administered 2015-07-14: 100 mg via INTRAVENOUS

## 2015-07-14 MED ORDER — TALC 5 G PL SUSR
INTRAPLEURAL | Status: DC | PRN
Start: 2015-07-14 — End: 2015-07-14
  Administered 2015-07-14: 4 g via INTRAPLEURAL

## 2015-07-14 MED ORDER — SUGAMMADEX SODIUM 200 MG/2ML IV SOLN
INTRAVENOUS | Status: DC | PRN
  Administered 2015-07-14: 160 mg via INTRAVENOUS

## 2015-07-14 MED ORDER — LIDOCAINE HCL (PF) 1 % IJ SOLN
INTRAMUSCULAR | Status: DC | PRN
  Administered 2015-07-14: 10 mL

## 2015-07-14 MED ORDER — LACTATED RINGERS IV SOLN
INTRAVENOUS | Status: DC | PRN
  Administered 2015-07-14: 13:00:00 via INTRAVENOUS

## 2015-07-14 MED ORDER — ROCURONIUM BROMIDE 100 MG/10ML IV SOLN
INTRAVENOUS | Status: DC | PRN
  Administered 2015-07-14: 30 mg via INTRAVENOUS

## 2015-07-14 MED ORDER — LIDOCAINE HCL (PF) 1 % IJ SOLN
INTRAMUSCULAR | Status: AC
  Filled 2015-07-14: qty 30

## 2015-07-14 MED ORDER — BUPIVACAINE HCL (PF) 0.25 % IJ SOLN
INTRAMUSCULAR | Status: AC
  Filled 2015-07-14: qty 30

## 2015-07-14 MED ORDER — ALBUTEROL SULFATE (2.5 MG/3ML) 0.083% IN NEBU: 2.5000 mg | INHALATION_SOLUTION | RESPIRATORY_TRACT | Status: DC

## 2015-07-14 MED ORDER — VANCOMYCIN HCL IN DEXTROSE 1-5 GM/200ML-% IV SOLN
1000.0000 mg | Freq: Two times a day (BID) | INTRAVENOUS | Status: AC
  Administered 2015-07-15: 1000 mg via INTRAVENOUS
  Filled 2015-07-14: qty 200

## 2015-07-14 MED ORDER — SODIUM CHLORIDE 0.9 % IV SOLN
INTRAVENOUS | Status: DC
  Administered 2015-07-14 – 2015-07-15 (×4): via INTRAVENOUS

## 2015-07-14 SURGICAL SUPPLY — 79 items
BAG DECANTER FOR FLEXI CONT (MISCELLANEOUS) ×2 IMPLANT
BLADE SURG SZ11 CARB STEEL (BLADE) ×2 IMPLANT
BNDG COHESIVE 4X5 TAN STRL (GAUZE/BANDAGES/DRESSINGS) ×2 IMPLANT
BRONCHOSCOPE PED SLIM DISP (MISCELLANEOUS) ×2 IMPLANT
CANISTER SUCT 1200ML W/VALVE (MISCELLANEOUS) ×2 IMPLANT
CATH THOR STR 32F 8032 SOFT WA (CATHETERS) IMPLANT
CATH TRAY 16F METER LATEX (MISCELLANEOUS) ×2 IMPLANT
CATH URET ROBINSON 16FR STRL (CATHETERS) IMPLANT
CHLORAPREP W/TINT 26ML (MISCELLANEOUS) ×4 IMPLANT
COVER LIGHT HANDLE STERIS (MISCELLANEOUS) ×4 IMPLANT
COVER MAYO STAND STRL (DRAPES) ×2 IMPLANT
CUTTER ECHEON FLEX ENDO 45 340 (ENDOMECHANICALS) IMPLANT
DEFOGGER SCOPE WARMER CLEARIFY (MISCELLANEOUS) ×2 IMPLANT
DRAIN CHANNEL 32F RND 10.7 FF (WOUND CARE) ×2 IMPLANT
DRAIN CHEST DRY SUCT SGL (MISCELLANEOUS) ×2 IMPLANT
DRAPE C-ARM XRAY 36X54 (DRAPES) ×2 IMPLANT
DRAPE C-SECTION (MISCELLANEOUS) ×2 IMPLANT
DRAPE MAG INST 16X20 L/F (DRAPES) ×2 IMPLANT
DRESSING TELFA 4X3 1S ST N-ADH (GAUZE/BANDAGES/DRESSINGS) ×4 IMPLANT
DRSG OPSITE POSTOP 3X4 (GAUZE/BANDAGES/DRESSINGS) ×4 IMPLANT
DRSG TEGADERM 2-3/8X2-3/4 SM (GAUZE/BANDAGES/DRESSINGS) ×4 IMPLANT
DRSG TEGADERM 4X4.75 (GAUZE/BANDAGES/DRESSINGS) ×2 IMPLANT
ELECT BLADE 6 FLAT ULTRCLN (ELECTRODE) IMPLANT
ELECT BLADE 6.5 EXT (BLADE) IMPLANT
ELECT CAUTERY BLADE TIP 2.5 (TIP) ×2
ELECT REM PT RETURN 9FT ADLT (ELECTROSURGICAL) ×2
ELECTRODE CAUTERY BLDE TIP 2.5 (TIP) ×1 IMPLANT
ELECTRODE REM PT RTRN 9FT ADLT (ELECTROSURGICAL) ×1 IMPLANT
GAUZE SPONGE 4X4 12PLY STRL (GAUZE/BANDAGES/DRESSINGS) ×2 IMPLANT
GLOVE EXAM LX STRL 7.5 (GLOVE) ×12 IMPLANT
GLOVE SURG SYN 7.5  E (GLOVE) ×1
GLOVE SURG SYN 7.5 E (GLOVE) ×1 IMPLANT
GOWN STRL REUS W/ TWL LRG LVL3 (GOWN DISPOSABLE) ×4 IMPLANT
GOWN STRL REUS W/TWL LRG LVL3 (GOWN DISPOSABLE) ×4
IV NS 500ML (IV SOLUTION) ×1
IV NS 500ML BAXH (IV SOLUTION) ×1 IMPLANT
KIT PLEURX DRAIN CATH 1000ML (MISCELLANEOUS) ×2 IMPLANT
KIT PLEURX DRAIN CATH 15.5FR (DRAIN) ×2 IMPLANT
KIT PORT POWER 8FR ISP CVUE (Catheter) ×2 IMPLANT
KIT RM TURNOVER STRD PROC AR (KITS) ×2 IMPLANT
LABEL OR SOLS (LABEL) ×2 IMPLANT
LOOP RED MAXI  1X406MM (MISCELLANEOUS)
LOOP VESSEL MAXI 1X406 RED (MISCELLANEOUS) IMPLANT
MARKER SKIN DUAL TIP RULER LAB (MISCELLANEOUS) ×2 IMPLANT
NDL SAFETY 22GX1.5 (NEEDLE) ×2 IMPLANT
NEEDLE FILTER BLUNT 18X 1/2SAF (NEEDLE) ×1
NEEDLE FILTER BLUNT 18X1 1/2 (NEEDLE) ×1 IMPLANT
NEEDLE SPNL 20GX3.5 QUINCKE YW (NEEDLE) IMPLANT
NS IRRIG 500ML POUR BTL (IV SOLUTION) IMPLANT
PACK BASIN MAJOR ARMC (MISCELLANEOUS) ×2 IMPLANT
PACK PORT-A-CATH (MISCELLANEOUS) ×2 IMPLANT
SCISSORS METZENBAUM CVD 33 (INSTRUMENTS) IMPLANT
SPONGE KITTNER 5P (MISCELLANEOUS) IMPLANT
STAPLER SKIN PROX 35W (STAPLE) ×2 IMPLANT
STAPLER VASCULAR ECHELON 35 (CUTTER) IMPLANT
STRIP CLOSURE SKIN 1/2X4 (GAUZE/BANDAGES/DRESSINGS) ×2 IMPLANT
SUT ETHILON 4-0 (SUTURE) ×1
SUT ETHILON 4-0 FS2 18XMFL BLK (SUTURE) ×1
SUT MNCRL AB 3-0 PS2 27 (SUTURE) ×4 IMPLANT
SUT PROLENE 2 0 SH DA (SUTURE) ×6 IMPLANT
SUT SILK 1 SH (SUTURE) ×8 IMPLANT
SUT VIC AB 0 CT1 36 (SUTURE) ×4 IMPLANT
SUT VIC AB 2-0 CT1 27 (SUTURE) ×2
SUT VIC AB 2-0 CT1 TAPERPNT 27 (SUTURE) ×2 IMPLANT
SUT VIC AB 2-0 CT2 27 (SUTURE) IMPLANT
SUT VIC AB 3-0 SH 27 (SUTURE) ×1
SUT VIC AB 3-0 SH 27X BRD (SUTURE) ×1 IMPLANT
SUT VICRYL 2 TP 1 (SUTURE) IMPLANT
SUTURE ETHLN 4-0 FS2 18XMF BLK (SUTURE) ×1 IMPLANT
SYR 3ML LL SCALE MARK (SYRINGE) ×2 IMPLANT
SYR BULB IRRIG 60ML STRL (SYRINGE) ×4 IMPLANT
SYRINGE 10CC LL (SYRINGE) ×4 IMPLANT
TAPE ADH 3 LX (MISCELLANEOUS) ×2 IMPLANT
TAPE TRANSPORE STRL 2 31045 (GAUZE/BANDAGES/DRESSINGS) ×2 IMPLANT
TRAY FOLEY W/METER SILVER 16FR (SET/KITS/TRAYS/PACK) ×2 IMPLANT
TROCAR FLEXIPATH 20X80 (ENDOMECHANICALS) ×2 IMPLANT
TROCAR FLEXIPATH THORACIC 15MM (ENDOMECHANICALS) IMPLANT
WATER STERILE IRR 1000ML POUR (IV SOLUTION) ×2 IMPLANT
YANKAUER SUCT BULB TIP FLEX NO (MISCELLANEOUS) ×2 IMPLANT

## 2015-07-14 NOTE — Progress Notes (Signed)
Charlene Williams Inpatient Post-Op Note  Patient ID: Charlene Williams, female   DOB: 18-Nov-1955, 60 y.o.   MRN: [id number]  HISTORY: Complaining of some pain and shortness of breath this morning.  Had a thoracentesis yesterday and resulting pneumothorax that apparently radiology believes is post vacuo and will not respond to chest tube drainage.   Filed Vitals:   07/13/15 2116 07/14/15 0458  BP: 100/58 107/75  Pulse: 82 90  Temp: 98.3 F (36.8 C) 99 F (37.2 C)  Resp: 18 18     EXAM: Resp: Lungs are clear on the right but distant and markedly diminished on the left.  No respiratory distress, normal effort. Heart:  Regular without murmurs Abd:  Abdomen is soft, non distended and non tender. No masses are palpable.  There is no rebound and no guarding.  Neurological: Alert and oriented to person, place, and time. Coordination normal.  Skin: Skin is warm and dry. No rash noted. No diaphoretic. No erythema. No pallor.  Psychiatric: Normal mood and affect. Normal behavior. Judgment and thought content normal.    ASSESSMENT: Will perform left VATS with talc pleurodesis and PortACath   PLAN:   For OR today.  Start IV Fluids.  Labs OK.  Discussed with patient that we may need PleurX catheter if lung does not expand and I will make that assessment in the OR.      Nestor Lewandowsky, MD

## 2015-07-14 NOTE — Progress Notes (Signed)
          To Whom It May Concern:        Please excuse Ms. Charlene Williams from work on the 19 th and the 21 st of April, 2017 as she has been attending her grandmother [name] who is battling serious illness at [facility]. Thank you for your understanding.        Sincerely,  Theodoro Grist, MD

## 2015-07-14 NOTE — Plan of Care (Signed)
Bedside report received from [place], [place] from [place]. Tolerated procedure well. Left chest tube with sanguineous output noted. Pressure dressing intact. Left chest wall with Dressing intact from port a cath placement. Postop foley catheter with clear yellow uop noted. O2 sat 95% on 3L O2 via [place]. Morphine '2mg'$  IV given for pain. Improvement noted. Xanax given with improvement noted.

## 2015-07-14 NOTE — Anesthesia Postprocedure Evaluation (Signed)
Anesthesia Post Note  Patient: Charlene Williams  Procedure(s) Performed: Procedure(s) (LRB): VIDEO ASSISTED THORACOSCOPY (VATS), pleural biopsy (Left) INSERTION PORT-A-CATH (Left)  Patient location during evaluation: PACU Anesthesia Type: General Level of consciousness: awake Pain management: pain level controlled Vital Signs Assessment: post-procedure vital signs reviewed and stable Respiratory status: spontaneous breathing Cardiovascular status: blood pressure returned to baseline Postop Assessment: no headache Anesthetic complications: no    Last Vitals:  Filed Vitals:   07/14/15 1224 07/14/15 1513  BP: 106/83 157/97  Pulse: 85 92  Temp: 37.4 C 36.3 C  Resp: 16 23    Last Pain:  Filed Vitals:   07/14/15 1525  PainSc: 6                  Taegan Haider M

## 2015-07-14 NOTE — Progress Notes (Signed)
Charlene Williams   DOB:[birth year]   JS#:[id number]    Subjective: Patient continues to be short of breath. Patient had a pneumothorax post ultrasound-guided thoracentesis yesterday. She is awaiting pleurodesis/possible chest tube today. Complains of chest pain in the left chest wall related.  ROS: no fevers. Continues to complain of abdominal distension. No diarrhea.   Objective:  Filed Vitals:   07/14/15 1558 07/14/15 1613  BP: 148/99 141/97  Pulse: 95 89  Temp:    Resp:       Intake/Output Summary (Last 24 hours) at 07/14/15 1618 Last data filed at 07/14/15 1558  Gross per 24 hour  Intake   1465 ml  Output   1605 ml  Net   -140 ml    GENERAL: Well-nourished well-developed; Alert, no distress and comfortable. Alone.   EYES: no pallor or icterus OROPHARYNX: no thrush or ulceration;  NECK: supple, no masses felt LYMPH: no palpable lymphadenopathy in the cervical, axillary or inguinal regions LUNGS: Diminished breath sounds on the left lung ; No wheeze or crackles HEART/CVS: regular rate & rhythm and no murmurs; No lower extremity edema ABDOMEN: abdomen soft, non-tender and normal bowel sounds; distended. No hepatosplenomegaly. Musculoskeletal:no cyanosis of digits and no clubbing; 3x4 cm mass noted in Right arm  PSYCH: alert & oriented x 3 with fluent speech NEURO: no focal motor/sensory deficits SKIN: no rashes or significant lesions.  Labs:  Lab Results  Component Value Date   WBC 8.6 07/12/2015   HGB 12.8 07/12/2015   HCT 38.5 07/12/2015   MCV 85.3 07/12/2015   PLT 405 07/12/2015   NEUTROABS 1.8 07/23/2012    Lab Results  Component Value Date   NA 135 07/12/2015   K 4.5 07/12/2015   CL 103 07/12/2015   CO2 24 07/12/2015    Studies:  Dg Chest Port 1 View  07/14/2015  CLINICAL DATA:  Postop port check. EXAM: CHEST 1 VIEW; RF - DG C-ARM 1-60 MIN COMPARISON:  07/14/2015 FINDINGS: Left-sided power port tip to level of the superior vena cava. A left-sided chest  tube has been placed with decreased size of left pneumothorax. There has been re-expansion of left lung and smaller size of left pneumothorax. There is significant opacity throughout the left lung. Left pleural effusion is noted. The right lung is clear. Heart contour is obscured by the opacity at the left lung base. IMPRESSION: 1. Interval placement of left-sided Port-A-Cath. 2. Interval placement of left-sided chest tube. 3. Smaller left pneumothorax. 4. Pulmonary opacity and pleural effusion. Electronically Signed   By: Nolon Nations M.D.   On: 07/14/2015 15:57   Dg Chest Port 1 View  07/14/2015  CLINICAL DATA:  Follow-up left-sided pneumothorax, history of COPD and left pleural effusion. EXAM: PORTABLE CHEST 1 VIEW COMPARISON:  Chest x-ray of July 13, 2015 FINDINGS: The pneumothorax on the left is increased and occupies approximately 40-50% of the lung volume. There is pleural fluid on the left as well and dust this is a hydro pneumothorax. The right lung is clear. The mediastinum is not significantly shifted. The cardiac silhouette is not enlarged the pulmonary vascularity appears normal. The bony thorax is unremarkable. IMPRESSION: Interval increase in the size of the left-sided hydro pneumothorax which now all occupies 40-50% of the pleural space volume. Critical Value/emergent results were called by telephone at the time of interpretation on 07/14/2015 at 7:33 am to Tamala Fothergill, RN, who verbally acknowledged these results. No active disease. Electronically Signed   By: David  Martinique M.D.  On: 07/14/2015 07:36   Dg Chest Port 1 View  07/13/2015  CLINICAL DATA:  Status post left thoracentesis for recurrent malignant effusion EXAM: PORTABLE CHEST 1 VIEW COMPARISON:  07/11/2015 FINDINGS: Large amount of the left effusion has been removed. Patient's previous chest x-ray demonstrated complete opacification of the left hemi thorax. Following the thoracentesis, the patient has a moderate residual left  hydro pneumothorax with pleural thickening of the lung related to incomplete re-expansion of the lung. Trachea is midline. Right lung remains clear. Residual lingula and left lower lobe collapse/ consolidation. Stable heart size and mediastinal contours. Atherosclerosis of the aorta evident. IMPRESSION: moderate-sized left hydro pneumothorax following large volume left thoracentesis compatible with incomplete re-expansion of the lung. Patient remains asymptomatic. These results were called by telephone at the time of interpretation on 07/13/2015 at 2:03 pm to Dr. Theodoro Grist , who verbally acknowledged these results. Electronically Signed   By: Jerilynn Mages.  Shick M.D.   On: 07/13/2015 14:04   Dg C-arm 1-60 Min-no Report  07/14/2015  CLINICAL DATA: port cath C-ARM 1-60 MINUTES Fluoroscopy was utilized by the requesting physician.  No radiographic interpretation.   US Thoracentesis Asp Pleural Space W/img Guide  07/13/2015  INDICATION: Recurrent malignant left pleural effusion, shortness of breath EXAM: ULTRASOUND GUIDED LEFT THORACENTESIS MEDICATIONS: 1% lidocaine locally COMPLICATIONS: None immediate. PROCEDURE: An ultrasound guided thoracentesis was thoroughly discussed with the patient and questions answered. The benefits, risks, alternatives and complications were also discussed. The patient understands and wishes to proceed with the procedure. Written consent was obtained. Ultrasound was performed to localize and mark an adequate pocket of fluid in the left chest. The area was then prepped and draped in the normal sterile fashion. 1% Lidocaine was used for local anesthesia. Under ultrasound guidance a 6 Fr Safe-T-Centesis catheter was introduced. Thoracentesis was performed. The catheter was removed and a dressing applied. FINDINGS: A total of approximately 1.8 L of bloody pleural fluid was removed. Samples were sent to the laboratory as requested by the clinical team. IMPRESSION: Successful ultrasound guided left  thoracentesis yielding 1.8 L of pleural fluid. Electronically Signed   By: Jerilynn Mages.  Shick M.D.   On: 07/13/2015 13:57    Assessment & Plan:   60 year old female patient with history of smoking with a recurrent left-sided pleural effusion- cytology positive for adenocarcinoma.  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 Pneumothorax from thoracentesis yesterday. Pleurodesis- likely going to help temporize the fluid buildup. Discussed with Dr. Faith Rogue  # Stage IV adenocarcinoma the lung- Plan outpatient Palliative chemotherapy. Carboplatin and Alimta every 3 weeks.    Cammie Sickle, MD 07/14/2015  4:18 PM

## 2015-07-14 NOTE — Progress Notes (Signed)
[facility] [place] NAME: Charlene Williams    MR#:  [id number]  DATE OF BIRTH:  [birth year]  SUBJECTIVE: I'm hurting in the right shoulder, Shoulder blade.  CHIEF COMPLAINT:  No chief complaint on file.  the patient is a 60 year old African-American female with past medical history significant for history of hypertension, hyperlipidemia, diabetes mellitus, COPD, not on oxygen at home, recent admission to the hospital and discharge on 06/27/2015 for left pleural effusion, left lower lobe pneumonia, SIRS on levofloxacin, who presents back to the hospital due to shortness of breath and left-sided chest pain, pleuritic in nature. She was seen by primary care physician and was sent back to the hospital for admission. Chest x-ray done in the emergency room revealed complete opacification of left hemithorax with mediastinal shift to the right, concerning for large pleural effusion. Adenocarcinoma was noted on cytology testing per pulmonologist, Dr. Ashby Dawes. Left thoracentesis was performed today, residual small pneumothorax was noted post thoracentesis, since lung did not expand well. Dr. Genevive Bi recommended chest tube placement by interventional radiologist, however, that was not performed yesterday and pneumothorax has worsened. Patient complains of chest pain on the left side, shortness of breath, patient is for VATS procedure today with talc pleurodesis and Port-A-Cath placement by Dr. Genevive Bi    Review of Systems  Constitutional: Negative for fever, chills and weight loss.  HENT: Negative for congestion.   Eyes: Negative for blurred vision and double vision.  Respiratory: Positive for shortness of breath. Negative for cough, sputum production and wheezing.   Cardiovascular: Positive for chest pain. Negative for palpitations, orthopnea, leg swelling and PND.  Gastrointestinal: Negative for nausea, vomiting, abdominal pain, diarrhea, constipation and  blood in stool.  Genitourinary: Negative for dysuria, urgency, frequency and hematuria.  Musculoskeletal: Negative for falls.  Neurological: Negative for dizziness, tremors, focal weakness and headaches.  Endo/Heme/Allergies: Does not bruise/bleed easily.  Psychiatric/Behavioral: Negative for depression. The patient does not have insomnia.     VITAL SIGNS: Blood pressure 106/83, pulse 85, temperature 99.4 F (37.4 C), temperature source Oral, resp. rate 16, height '5\' 3"'$  (1.6 m), weight 73.936 kg (163 lb), SpO2 97 %.  PHYSICAL EXAMINATION:   GENERAL:  60 y.o.-year-old patient lying in the bed in mild respiratory distress, tachypneic, uncomfortable, somewhat diaphoretic. Right deltoid area nodule/mass, nonmovable, hard to palpate of approximately 1 inch in diameter EYES: Pupils equal, round, reactive to light and accommodation. No scleral icterus. Extraocular muscles intact.  HEENT: Head atraumatic, normocephalic. Oropharynx and nasopharynx clear.  NECK:  Supple, no jugular venous distention. No thyroid enlargement, no tenderness.  LUNGS:Markedly diminished breath  Sounds on the left, dull to percussion, no wheezing,few rhonchi , no crepitations. Intermittently using accessory muscles of respiration.  CARDIOVASCULAR: S1, S2 normal. No murmurs, rubs, or gallops.  ABDOMEN: Soft, nontender, nondistended. Bowel sounds present. No organomegaly or mass.  EXTREMITIES: No pedal edema, cyanosis, or clubbing.  NEUROLOGIC: Cranial nerves II through XII are intact. Muscle strength 5/5 in all extremities. Sensation intact. Gait not checked.  PSYCHIATRIC: The patient is alert and oriented x 3  SKIN: No obvious rash, lesion, or ulcer.   ORDERS/RESULTS REVIEWED:   CBC  Recent Labs Lab 07/11/15 1700 07/12/15 0527  WBC 7.4 8.6  HGB 12.7 12.8  HCT 37.7 38.5  PLT 430 405  MCV 83.9 85.3  MCH 28.3 28.5  MCHC 33.8 33.4  RDW 14.8* 14.5    ------------------------------------------------------------------------------------------------------------------  Chemistries   Recent Labs Lab 07/11/15 1700 07/12/15  0527  NA 139 135  K 4.1 4.5  CL 106 103  CO2 25 24  GLUCOSE 97 145*  BUN 11 11  CREATININE 0.88 0.90  CALCIUM 9.3 9.1   ------------------------------------------------------------------------------------------------------------------ estimated creatinine clearance is 63.2 mL/min (by C-G formula based on Cr of 0.9). ------------------------------------------------------------------------------------------------------------------ No results for input(s): TSH, T4TOTAL, T3FREE, THYROIDAB in the last 72 hours.  Invalid input(s): FREET3  Cardiac Enzymes No results for input(s): CKMB, TROPONINI, MYOGLOBIN in the last 168 hours.  Invalid input(s): CK ------------------------------------------------------------------------------------------------------------------ Invalid input(s): POCBNP ---------------------------------------------------------------------------------------------------------------  RADIOLOGY: Ct Chest W Contrast  07/12/2015  CLINICAL DATA:  60 year old female inpatient with large left pleural effusion demonstrating metastatic adenocarcinoma on 06/26/2015 left thoracentesis, presenting for staging. EXAM: CT CHEST, ABDOMEN, AND PELVIS WITH CONTRAST TECHNIQUE: Multidetector CT imaging of the chest, abdomen and pelvis was performed following the standard protocol during bolus administration of intravenous contrast. CONTRAST:  174m ISOVUE-300 IOPAMIDOL (ISOVUE-300) INJECTION 61% COMPARISON:  06/25/2015 chest CT. FINDINGS: CT CHEST Mediastinum/Nodes: Right mediastinal shift. Normal heart size. No pericardial fluid/thickening. Left anterior descending, left circumflex and right coronary atherosclerosis. Great vessels are normal in course and caliber. No central pulmonary emboli. Normal visualized thyroid.  Normal esophagus. No axillary adenopathy. Mildly prominent left internal mammary nodes, largest 0.5 cm (series 2/ image 14). Mild right hilar adenopathy, largest 1.2 cm (series 2/ image 24). Mild left hilar adenopathy, largest 1.4 cm (series 2/ image 26). No additional pathologically enlarged mediastinal nodes. Lungs/Pleura: No pneumothorax. No right pleural effusion. Large malignant left pleural effusion is significantly increased since 06/25/2015, with irregular and slightly nodular pleural thickening and hyperenhancement throughout the mid to lower left pleural space. Complete left upper lobe and left lower lobe atelectasis. Suggestion of a heterogeneous 5.2 x 3.0 cm lung mass in the posterior left lower lobe (series 2/ image 34), which is poorly delineated given the complete left lung collapse. Medial right lower lobe 5 mm solid pulmonary nodule (series 5/image 116), stable since 06/25/2015 and minimally increased from 4 mm on 07/18/2012, probably benign. No additional significant pulmonary nodules in the aerated lungs. Mild centrilobular emphysema in the aerated right lung. Musculoskeletal: Lytic osseous metastasis in the posterior right fifth rib. Moderate degenerative changes in the thoracic spine. CT ABDOMEN AND PELVIS Hepatobiliary: Hypodense 0.9 cm right liver lobe lesion (series 2/image 40), which was flash filling and unchanged in size on the 07/18/2012 CT study suggesting a benign lesion. No additional liver lesions. Normal gallbladder with no radiopaque cholelithiasis. No biliary ductal dilatation. Pancreas: Hypodense 0.7 cm lesion in the pancreatic body (series 2/ image 57), not definitely seen on 07/18/2012. Otherwise normal pancreas, with no main pancreatic duct dilation. Spleen: Normal size. No mass. Adrenals/Urinary Tract: Normal right adrenal. Left adrenal 1.4 cm nodule (series 2/ image 55), stable in size since 07/18/2012, most in keeping with a benign left adrenal adenoma. Subcentimeter  hypodense renal cortical lesion in the posterior upper right kidney, too small to characterize, not definitely changed since 07/18/2012, suggesting a benign renal cyst. Simple 2.5 cm renal cyst in the lower left kidney. No hydronephrosis. Normal bladder. Stomach/Bowel: Grossly normal stomach. Normal caliber small bowel with no small bowel wall thickening. Normal appendix. Mild distal colonic diverticulosis, with no large bowel wall thickening or pericolonic fat stranding. Oral contrast progresses to the splenic flexure of the colon. Vascular/Lymphatic: Atherosclerotic nonaneurysmal abdominal aorta. Patent portal, splenic, hepatic and renal veins. No pathologically enlarged lymph nodes in the abdomen or pelvis. Reproductive: Status post hysterectomy, with no abnormal findings at the vaginal  cuff. No adnexal mass. Other: No pneumoperitoneum, ascites or focal fluid collection. Musculoskeletal: No aggressive appearing focal osseous lesions. Mild degenerative changes in the lumbar spine. IMPRESSION: 1. Large malignant left pleural effusion, significantly increased in size since 06/25/2015, with enhancing irregular nodular pleural thickening throughout the mid to lower left pleural space. 2. Suggestion of a posterior left lower lobe 5.2 cm lung mass, poorly delineated given the complete left lung collapse, and suspected to represent a primary bronchogenic carcinoma. 3. Bilateral hilar and left internal mammary lymphadenopathy, suspicious for nodal metastases. 4. Posterior right fifth rib lytic osseous metastasis. 5. Subcentimeter low-density lesion in the pancreatic body, new since 07/18/2012, indeterminate, differential includes cystic pancreatic lesion or pancreatic metastasis. If clinically warranted, further evaluation with pancreas protocol MRI abdomen without and with IV contrast could be performed. 6. No additional potential sites of metastatic disease in the abdomen or pelvis. Low-density subcentimeter right liver  lobe lesion and left adrenal nodule are stable since 07/18/2012 and probably benign. 7. Additional findings include three-vessel coronary atherosclerosis, mild emphysema and mild distal colonic diverticulosis. Electronically Signed   By: Ilona Sorrel M.D.   On: 07/12/2015 16:50   Ct Abdomen Pelvis W Contrast  07/12/2015  CLINICAL DATA:  60 year old female inpatient with large left pleural effusion demonstrating metastatic adenocarcinoma on 06/26/2015 left thoracentesis, presenting for staging. EXAM: CT CHEST, ABDOMEN, AND PELVIS WITH CONTRAST TECHNIQUE: Multidetector CT imaging of the chest, abdomen and pelvis was performed following the standard protocol during bolus administration of intravenous contrast. CONTRAST:  164m ISOVUE-300 IOPAMIDOL (ISOVUE-300) INJECTION 61% COMPARISON:  06/25/2015 chest CT. FINDINGS: CT CHEST Mediastinum/Nodes: Right mediastinal shift. Normal heart size. No pericardial fluid/thickening. Left anterior descending, left circumflex and right coronary atherosclerosis. Great vessels are normal in course and caliber. No central pulmonary emboli. Normal visualized thyroid. Normal esophagus. No axillary adenopathy. Mildly prominent left internal mammary nodes, largest 0.5 cm (series 2/ image 14). Mild right hilar adenopathy, largest 1.2 cm (series 2/ image 24). Mild left hilar adenopathy, largest 1.4 cm (series 2/ image 26). No additional pathologically enlarged mediastinal nodes. Lungs/Pleura: No pneumothorax. No right pleural effusion. Large malignant left pleural effusion is significantly increased since 06/25/2015, with irregular and slightly nodular pleural thickening and hyperenhancement throughout the mid to lower left pleural space. Complete left upper lobe and left lower lobe atelectasis. Suggestion of a heterogeneous 5.2 x 3.0 cm lung mass in the posterior left lower lobe (series 2/ image 34), which is poorly delineated given the complete left lung collapse. Medial right lower lobe  5 mm solid pulmonary nodule (series 5/image 116), stable since 06/25/2015 and minimally increased from 4 mm on 07/18/2012, probably benign. No additional significant pulmonary nodules in the aerated lungs. Mild centrilobular emphysema in the aerated right lung. Musculoskeletal: Lytic osseous metastasis in the posterior right fifth rib. Moderate degenerative changes in the thoracic spine. CT ABDOMEN AND PELVIS Hepatobiliary: Hypodense 0.9 cm right liver lobe lesion (series 2/image 40), which was flash filling and unchanged in size on the 07/18/2012 CT study suggesting a benign lesion. No additional liver lesions. Normal gallbladder with no radiopaque cholelithiasis. No biliary ductal dilatation. Pancreas: Hypodense 0.7 cm lesion in the pancreatic body (series 2/ image 57), not definitely seen on 07/18/2012. Otherwise normal pancreas, with no main pancreatic duct dilation. Spleen: Normal size. No mass. Adrenals/Urinary Tract: Normal right adrenal. Left adrenal 1.4 cm nodule (series 2/ image 55), stable in size since 07/18/2012, most in keeping with a benign left adrenal adenoma. Subcentimeter hypodense renal cortical lesion in  the posterior upper right kidney, too small to characterize, not definitely changed since 07/18/2012, suggesting a benign renal cyst. Simple 2.5 cm renal cyst in the lower left kidney. No hydronephrosis. Normal bladder. Stomach/Bowel: Grossly normal stomach. Normal caliber small bowel with no small bowel wall thickening. Normal appendix. Mild distal colonic diverticulosis, with no large bowel wall thickening or pericolonic fat stranding. Oral contrast progresses to the splenic flexure of the colon. Vascular/Lymphatic: Atherosclerotic nonaneurysmal abdominal aorta. Patent portal, splenic, hepatic and renal veins. No pathologically enlarged lymph nodes in the abdomen or pelvis. Reproductive: Status post hysterectomy, with no abnormal findings at the vaginal cuff. No adnexal mass. Other: No  pneumoperitoneum, ascites or focal fluid collection. Musculoskeletal: No aggressive appearing focal osseous lesions. Mild degenerative changes in the lumbar spine. IMPRESSION: 1. Large malignant left pleural effusion, significantly increased in size since 06/25/2015, with enhancing irregular nodular pleural thickening throughout the mid to lower left pleural space. 2. Suggestion of a posterior left lower lobe 5.2 cm lung mass, poorly delineated given the complete left lung collapse, and suspected to represent a primary bronchogenic carcinoma. 3. Bilateral hilar and left internal mammary lymphadenopathy, suspicious for nodal metastases. 4. Posterior right fifth rib lytic osseous metastasis. 5. Subcentimeter low-density lesion in the pancreatic body, new since 07/18/2012, indeterminate, differential includes cystic pancreatic lesion or pancreatic metastasis. If clinically warranted, further evaluation with pancreas protocol MRI abdomen without and with IV contrast could be performed. 6. No additional potential sites of metastatic disease in the abdomen or pelvis. Low-density subcentimeter right liver lobe lesion and left adrenal nodule are stable since 07/18/2012 and probably benign. 7. Additional findings include three-vessel coronary atherosclerosis, mild emphysema and mild distal colonic diverticulosis. Electronically Signed   By: Ilona Sorrel M.D.   On: 07/12/2015 16:50   Dg Chest Port 1 View  07/14/2015  CLINICAL DATA:  Follow-up left-sided pneumothorax, history of COPD and left pleural effusion. EXAM: PORTABLE CHEST 1 VIEW COMPARISON:  Chest x-ray of July 13, 2015 FINDINGS: The pneumothorax on the left is increased and occupies approximately 40-50% of the lung volume. There is pleural fluid on the left as well and dust this is a hydro pneumothorax. The right lung is clear. The mediastinum is not significantly shifted. The cardiac silhouette is not enlarged the pulmonary vascularity appears normal. The bony  thorax is unremarkable. IMPRESSION: Interval increase in the size of the left-sided hydro pneumothorax which now all occupies 40-50% of the pleural space volume. Critical Value/emergent results were called by telephone at the time of interpretation on 07/14/2015 at 7:33 am to Tamala Fothergill, RN, who verbally acknowledged these results. No active disease. Electronically Signed   By: David  Martinique M.D.   On: 07/14/2015 07:36   Dg Chest Port 1 View  07/13/2015  CLINICAL DATA:  Status post left thoracentesis for recurrent malignant effusion EXAM: PORTABLE CHEST 1 VIEW COMPARISON:  07/11/2015 FINDINGS: Large amount of the left effusion has been removed. Patient's previous chest x-ray demonstrated complete opacification of the left hemi thorax. Following the thoracentesis, the patient has a moderate residual left hydro pneumothorax with pleural thickening of the lung related to incomplete re-expansion of the lung. Trachea is midline. Right lung remains clear. Residual lingula and left lower lobe collapse/ consolidation. Stable heart size and mediastinal contours. Atherosclerosis of the aorta evident. IMPRESSION: moderate-sized left hydro pneumothorax following large volume left thoracentesis compatible with incomplete re-expansion of the lung. Patient remains asymptomatic. These results were called by telephone at the time of interpretation on 07/13/2015 at  2:03 pm to Dr. Theodoro Grist , who verbally acknowledged these results. Electronically Signed   By: Jerilynn Mages.  Shick M.D.   On: 07/13/2015 14:04   Dg C-arm 1-60 Min-no Report  07/14/2015  CLINICAL DATA: port cath C-ARM 1-60 MINUTES Fluoroscopy was utilized by the requesting physician.  No radiographic interpretation.   US Thoracentesis Asp Pleural Space W/img Guide  07/13/2015  INDICATION: Recurrent malignant left pleural effusion, shortness of breath EXAM: ULTRASOUND GUIDED LEFT THORACENTESIS MEDICATIONS: 1% lidocaine locally COMPLICATIONS: None immediate. PROCEDURE: An  ultrasound guided thoracentesis was thoroughly discussed with the patient and questions answered. The benefits, risks, alternatives and complications were also discussed. The patient understands and wishes to proceed with the procedure. Written consent was obtained. Ultrasound was performed to localize and mark an adequate pocket of fluid in the left chest. The area was then prepped and draped in the normal sterile fashion. 1% Lidocaine was used for local anesthesia. Under ultrasound guidance a 6 Fr Safe-T-Centesis catheter was introduced. Thoracentesis was performed. The catheter was removed and a dressing applied. FINDINGS: A total of approximately 1.8 L of bloody pleural fluid was removed. Samples were sent to the laboratory as requested by the clinical team. IMPRESSION: Successful ultrasound guided left thoracentesis yielding 1.8 L of pleural fluid. Electronically Signed   By: Jerilynn Mages.  Shick M.D.   On: 07/13/2015 13:57    EKG:  Orders placed or performed during the hospital encounter of 07/11/15  . EKG 12 lead  . EKG 12 lead    ASSESSMENT AND PLAN:  Active Problems:   COPD exacerbation (HCC)   Recurrent left pleural effusion   Pleural effusion #1. 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 Genevive Bi saw patient in consultation and recommended VATS procedure with talc pleurodesis, pleural biopsy, Port-A-Cath insertion for today 21st of April 2017. #2 . Adjustment reaction, supportive therapy, Xanax as needed, , continue Remeron #3. Essential hypertension, well controlled #4. Diabetes mellitus, continue diabetic diet, sliding scale insulin, blood glucose levels were initially high because of stress, now off steroids, blood glucose  levels are between 80-120. #5. COPD , no obvious exacerbation, off steroids, continue DuoNeb and Pulmicort inhalation therapy, stable, continue oxygen therapy as patient's O2 sats were low due to pneumothorax #6. LLL mass , no obvious pneumonia on CT, now off antibiotic therapy #7. Acute on chronic respiratory failure with hypoxia due to pneumothorax after thoracentesis on 07/13/2015, patient will be undergoing VATS procedure with talc pleurodesis today by Dr. Genevive Bi, continue oxygen therapy as needed  Management plans discussed with the patient, family and they are in agreement.   DRUG ALLERGIES:  Allergies  Allergen Reactions  . Ampicillin Hives and Other (See Comments)    Has patient had a PCN reaction causing immediate rash, facial/tongue/throat swelling, SOB or lightheadedness with hypotension: No Has patient had a PCN reaction causing severe rash involving mucus membranes or skin necrosis: No Has patient had a PCN reaction that required hospitalization No Has patient had a PCN reaction occurring within the last 10 years: Yes If all of the above answers are "NO", then may proceed with Cephalosporin use.  Marland Kitchen Penicillins Hives and Other (See Comments)    Has patient had a PCN reaction causing immediate rash, facial/tongue/throat swelling, SOB or lightheadedness with hypotension: No Has patient had a PCN  reaction causing severe rash involving mucus membranes or skin necrosis: No Has patient had a PCN reaction that required hospitalization No Has patient had a PCN reaction occurring within the last 10 years: Yes If all of the above answers are "NO", then may proceed with Cephalosporin use.    CODE STATUS:     Code Status Orders        Start     Ordered   07/11/15 1725  Full code   Continuous     07/11/15 1725    Code Status History    Date Active Date Inactive Code Status Order ID Comments User Context   06/25/2015  6:35 PM 06/27/2015  4:00 PM Full Code [id number]  Theodoro Grist, MD ED       TOTAL TIME TAKING CARE OF THIS PATIENT: 35 minutes.  Discussed with Dr. Aram Beecham M.D on 07/14/2015 at 3:12 PM  Between 7am to 6pm - Pager - [phone]  After 6pm go to [email or web address] - password [email or web address] [facility]  [facility]  Office  [phone]  CC: Primary care physician; Kasandra Knudsen, NP

## 2015-07-14 NOTE — Plan of Care (Signed)
Problem: Pain Managment: Goal: General experience of comfort will improve Outcome: Progressing Morphine '2mg'$  IV given for pain relief. Improvement noted. Xanax given. Improvement noted.

## 2015-07-14 NOTE — Transfer of Care (Signed)
Immediate Anesthesia Transfer of Care Note  Patient: Charlene Williams  Procedure(s) Performed: Procedure(s): VIDEO ASSISTED THORACOSCOPY (VATS), pleural biopsy (Left) INSERTION PORT-A-CATH (Left)  Patient Location: PACU  Anesthesia Type:General  Level of Consciousness: sedated  Airway & Oxygen Therapy: Patient Spontanous Breathing and Patient connected to face mask oxygen  Post-op Assessment: Report given to RN and Post -op Vital signs reviewed and stable  Post vital signs: Reviewed and stable  Last Vitals:  Filed Vitals:   07/14/15 0753 07/14/15 1224  BP: 116/58 106/83  Pulse: 92 85  Temp: 37 C 37.4 C  Resp: 16 16    Complications: No apparent anesthesia complications

## 2015-07-14 NOTE — Plan of Care (Signed)
Report called to OR, RN. No unanswered questions. Procedure consent signed and witnessed. Blood consent signed and witnessed. Transported via bed with surgery transported. Vancomycin sent with transporter.

## 2015-07-14 NOTE — Anesthesia Procedure Notes (Signed)
Procedure Name: Intubation Date/Time: 07/14/2015 1:05 PM Performed by: Jenetta Downer Pre-anesthesia Checklist: Patient identified, Emergency Drugs available, Suction available, Patient being monitored and Timeout performed Patient Re-evaluated:Patient Re-evaluated prior to inductionOxygen Delivery Method: Circle system utilized Preoxygenation: Pre-oxygenation with 100% oxygen Intubation Type: IV induction Ventilation: Mask ventilation without difficulty Laryngoscope Size: Miller and 2 Grade View: Grade I Tube type: Oral Endobronchial tube: Left, Bronchial Blocker placed under direct vision and EBT position confirmed by fiberoptic bronchoscope Tube size: 8.0 mm Number of attempts: 2 (#65f double lumen would not fit; #8 ETT tube then inserted and bronchial blocker placed) Airway Equipment and Method: Stylet and Fiberoptic brochoscope Placement Confirmation: ETT inserted through vocal cords under direct vision,  positive ETCO2,  CO2 detector and breath sounds checked- equal and bilateral Secured at: 21 cm Tube secured with: Tape Dental Injury: Teeth and Oropharynx as per pre-operative assessment

## 2015-07-14 NOTE — Anesthesia Preprocedure Evaluation (Addendum)
Anesthesia Evaluation  Patient identified by MRN, date of birth, ID band Patient awake    Reviewed: Allergy & Precautions, NPO status , Patient's Chart, lab work & pertinent test results  Airway Mallampati: I  TM Distance: >3 FB Neck ROM: Full    Dental  (+) Upper Dentures   Pulmonary sleep apnea and Continuous Positive Airway Pressure Ventilation , pneumonia, unresolved, COPD,  COPD inhaler and oxygen dependent, Current Smoker,   No breath sounds on the left.   + decreased breath sounds      Cardiovascular Exercise Tolerance: Poor hypertension, Pt. on medications and Pt. on home beta blockers Normal cardiovascular exam Rhythm:Regular Rate:Normal     Neuro/Psych    GI/Hepatic hiatal hernia,   Endo/Other  diabetes, Type 2BG 125.  Renal/GU      Musculoskeletal  (+) Arthritis , Osteoarthritis,    Abdominal (+)  Abdomen: soft.    Peds  Hematology Hb 12.8.   Anesthesia Other Findings   Reproductive/Obstetrics                            Anesthesia Physical Anesthesia Plan  ASA: IV  Anesthesia Plan: General   Post-op Pain Management:    Induction: Intravenous  Airway Management Planned: Double Lumen EBT  Additional Equipment:   Intra-op Plan:   Post-operative Plan: Extubation in OR  Informed Consent: I have reviewed the patients History and Physical, chart, labs and discussed the procedure including the risks, benefits and alternatives for the proposed anesthesia with the patient or authorized representative who has indicated his/her understanding and acceptance.     Plan Discussed with: CRNA and Surgeon  Anesthesia Plan Comments: (Will likely need a normal ETT and a bronchial blocker. We may not be able to pass even a 35 DLT.)       Anesthesia Quick Evaluation

## 2015-07-14 NOTE — Op Note (Signed)
  07/11/2015 - 07/14/2015  3:29 PM  PATIENT:  Charlene Williams  60 y.o. female  PRE-OPERATIVE DIAGNOSIS:  Malignant pleural effusion left side  POST-OPERATIVE DIAGNOSIS:  Same  PROCEDURE:  #1 left thoracoscopy with biopsy of pleural space #2 talc pleurodesis #3 insertion of tunneled Pleurx catheter #4 Port-A-Cath insertion using fluoroscopic guidance  SURGEON:  Surgeon(s) and Role:    * Nestor Lewandowsky, MD - Primary  ASSISTANTS: None  ANESTHESIA: Gen.  INDICATIONS FOR PROCEDURE this lady has a malignant pleural effusion and requires additional sampling for definitive tumor markers. She has been apprised of the indications and risks of talc pleurodesis as well as insertion of the Port-A-Cath. She gave her informed consent  DICTATION: The patient is brought to the operating suite and placed in supine position. General endotracheal anesthesia was given with a single-lumen tube. A bronchial blocker was placed in the left mainstem bronchus. At this point the patient become quite unstable with saturations into the 70s and I was concerned that she may have a tension pneumothorax. She was quickly turned on her side and secured to the table. The left chest was then prepped and draped in usual sterile fashion. A skin incision was made and carried down through the muscles of the chest wall into the pleural space was entered. This was approximately the seventh intercostal space. At that point we then passed the scope off the floor and took a look into the pleural space and could identify multiple pleural based tumors. Several these were biopsied. Hemostasis was complete. The remaining portions of the lung did show some adhesions. The lung itself looked like it may expand. For this reason I placed 5 g of sterile talc under direct visualization. The chest was then drained with a 32 [place] Blake position along the paravertebral space. A Pleurx catheter was also inserted through a separate stab wound and positioned  into the chest cavity through our thoracoscopy port. The muscles of the chest were then closed over the tubes and the subcutaneous tissues and skin were also closed. Sterile dressings were applied. At this point the patient's oxygenation was much improved. Lungs are then used for the remaining portion of the case for ventilation.  The patient was placed in the supine position. The left chest was then prepped and draped in usual sterile fashion. The left subclavian vein was percutaneously catheterized and under fluoroscopic guidance a wire was introduced into the right side of the heart. A skin incision was then made on the anterior chest wall and a port site was selected and created using electrocautery. The catheter was then tunneled from the port site to the entrance site and was placed through a peel-away sheath and positioned at the junction of the superior vena cava right atrium. Catheter was then assembled and secured to the anterior chest wall with multiple 2-0 Prolene sutures. The catheter irrigated and flushed nicely. Fluoroscopic guidance was again used in therapy the catheters in appropriate position. The wounds were then closed with 2-0 Vicryl and the subcutaneous tissues and 4-0 nylon on the skin. Sterile dressings were applied. Patient was awakened from general endotracheal anesthesia and taken to the recovery room in stable condition.   Nestor Lewandowsky, MD

## 2015-07-15 DIAGNOSIS — Z938 Other artificial opening status: Secondary | ICD-10-CM

## 2015-07-15 DIAGNOSIS — T85848S Pain due to other internal prosthetic devices, implants and grafts, sequela: Secondary | ICD-10-CM

## 2015-07-15 LAB — CREATININE, SERUM
Creatinine, Ser: 0.76 mg/dL (ref 0.44–1.00)
GFR calc Af Amer: >60 mL/min (ref >60)
GFR calc non Af Amer: >60 mL/min (ref >60)

## 2015-07-15 LAB — GLUCOSE, CAPILLARY
GLUCOSE-CAPILLARY: 137 mg/dL — AB (ref 65–99)
GLUCOSE-CAPILLARY: 235 mg/dL — AB (ref 65–99)
GLUCOSE-CAPILLARY: 133 mg/dL — AB (ref 65–99)
GLUCOSE-CAPILLARY: 142 mg/dL — AB (ref 65–99)
Glucose-Capillary: 419 mg/dL — ABNORMAL HIGH (ref 65–99)

## 2015-07-15 MED ORDER — OXYCODONE HCL 5 MG PO TABS
5.0000 mg | ORAL_TABLET | ORAL | Status: DC | PRN
Start: 2015-07-15 — End: 2015-07-16
  Administered 2015-07-15: 10 mg via ORAL
  Administered 2015-07-15: 13:00:00 5 mg via ORAL
  Administered 2015-07-16: 10 mg via ORAL
  Filled 2015-07-15: qty 10
  Filled 2015-07-15 (×2): qty 2

## 2015-07-15 MED ORDER — OXYCODONE HCL 5 MG PO TABS
5.0000 mg | ORAL_TABLET | Freq: Four times a day (QID) | ORAL | Status: DC | PRN
  Administered 2015-07-15: 10 mg via ORAL
  Filled 2015-07-15: qty 2

## 2015-07-15 MED ORDER — HYDROMORPHONE HCL 1 MG/ML IJ SOLN
1.0000 mg | INTRAMUSCULAR | Status: DC | PRN
  Administered 2015-07-15 – 2015-07-16 (×6): 1 mg via INTRAVENOUS
  Filled 2015-07-15 (×6): qty 1

## 2015-07-15 MED ORDER — CETYLPYRIDINIUM CHLORIDE 0.05 % MT LIQD
7.0000 mL | Freq: Two times a day (BID) | OROMUCOSAL | Status: DC
  Administered 2015-07-15 – 2015-07-18 (×7): 7 mL via OROMUCOSAL

## 2015-07-15 MED ORDER — HYDROMORPHONE HCL 1 MG/ML IJ SOLN
1.0000 mg | INTRAMUSCULAR | Status: DC | PRN
  Administered 2015-07-15 (×2): 1 mg via INTRAVENOUS
  Filled 2015-07-15 (×2): qty 1

## 2015-07-15 MED ORDER — ENOXAPARIN SODIUM 40 MG/0.4ML ~~LOC~~ SOLN
40.0000 mg | SUBCUTANEOUS | Status: DC
  Administered 2015-07-15 – 2015-07-17 (×3): 40 mg via SUBCUTANEOUS
  Filled 2015-07-15 (×3): qty 0.4

## 2015-07-15 NOTE — Progress Notes (Signed)
1 Day Post-Op   Subjective:  60 year old female one day status post left thoracoscopy with biopsy of pleural space and talc pleurodesis for malignant pleural effusion. Continues to complain of left-sided chest pain at the operative sites. States that her pain has not been well controlled overnight. She is tolerating a diet.  Vital signs in last 24 hours: Temp:  [97.4 F (36.3 C)-99.4 F (37.4 C)] 99.2 F (37.3 C) (04/22 0539) Pulse Rate:  [85-117] 117 (04/22 0539) Resp:  [11-23] 18 (04/22 0539) BP: (106-165)/(75-99) 126/75 mmHg (04/22 0539) SpO2:  [90 %-97 %] 96 % (04/22 0802) Weight:  [73.619 kg (162 lb 4.8 oz)-73.936 kg (163 lb)] 73.619 kg (162 lb 4.8 oz) (04/22 0500) Last BM Date: 07/11/15  Intake/Output from previous day: 04/21 0701 - 04/22 0700 In: 2005 [I.V.:2005] Out: 3555 [Urine:2105; Blood:50; Chest Tube:600]  Physical exam: Gen.: No acute distress Chest: Multiple left-sided chest tubes in place with small amount of air leak visualized and catheters. Draining a serous and was fluid Heart: Tachycardic GI: soft, non-tender; bowel sounds normal; no masses,  no organomegaly  Lab Results:  CBC  Recent Labs  07/14/15 1822  WBC 11.0  HGB 13.5  HCT 42.1  PLT 388   CMP     Component Value Date/Time   NA 137 07/14/2015 1822   NA 141 02/15/2013 1152   K 4.8 07/14/2015 1822   K 4.1 02/15/2013 1152   CL 106 07/14/2015 1822   CL 115* 02/15/2013 1152   CO2 26 07/14/2015 1822   CO2 21 02/15/2013 1152   GLUCOSE 161* 07/14/2015 1822   GLUCOSE 85 02/15/2013 1152   BUN 11 07/14/2015 1822   BUN 20* 02/15/2013 1152   CREATININE 0.76 07/15/2015 0532   CREATININE 0.96 02/15/2013 1152   CALCIUM 8.7* 07/14/2015 1822   CALCIUM 9.0 02/15/2013 1152   PROT 6.9 06/26/2015 0040   PROT 7.8 07/18/2012 1025   ALBUMIN 3.3* 06/26/2015 0040   ALBUMIN 4.0 07/18/2012 1025   AST 20 06/26/2015 0040   AST 20 07/18/2012 1025   ALT 15 06/26/2015 0040   ALT 20 07/18/2012 1025   ALKPHOS  93 06/26/2015 0040   ALKPHOS 116 07/18/2012 1025   BILITOT 0.5 06/26/2015 0040   BILITOT 0.5 07/18/2012 1025   GFRNONAA >60 07/15/2015 0532   GFRNONAA >60 02/15/2013 1152   GFRAA >60 07/15/2015 0532   GFRAA >60 02/15/2013 1152   PT/INR  Recent Labs  07/13/15 1006  LABPROT 13.5  INR 1.01    Studies/Results: Dg Chest Port 1 View  07/14/2015  CLINICAL DATA:  Postop port check. EXAM: CHEST 1 VIEW; RF - DG C-ARM 1-60 MIN COMPARISON:  07/14/2015 FINDINGS: Left-sided power port tip to level of the superior vena cava. A left-sided chest tube has been placed with decreased size of left pneumothorax. There has been re-expansion of left lung and smaller size of left pneumothorax. There is significant opacity throughout the left lung. Left pleural effusion is noted. The right lung is clear. Heart contour is obscured by the opacity at the left lung base. IMPRESSION: 1. Interval placement of left-sided Port-A-Cath. 2. Interval placement of left-sided chest tube. 3. Smaller left pneumothorax. 4. Pulmonary opacity and pleural effusion. Electronically Signed   By: Nolon Nations M.D.   On: 07/14/2015 15:57   Dg Chest Port 1 View  07/14/2015  CLINICAL DATA:  Follow-up left-sided pneumothorax, history of COPD and left pleural effusion. EXAM: PORTABLE CHEST 1 VIEW COMPARISON:  Chest x-ray of July 13, 2015 FINDINGS: The pneumothorax on the left is increased and occupies approximately 40-50% of the lung volume. There is pleural fluid on the left as well and dust this is a hydro pneumothorax. The right lung is clear. The mediastinum is not significantly shifted. The cardiac silhouette is not enlarged the pulmonary vascularity appears normal. The bony thorax is unremarkable. IMPRESSION: Interval increase in the size of the left-sided hydro pneumothorax which now all occupies 40-50% of the pleural space volume. Critical Value/emergent results were called by telephone at the time of interpretation on 07/14/2015 at  7:33 am to Tamala Fothergill, RN, who verbally acknowledged these results. No active disease. Electronically Signed   By: David  Martinique M.D.   On: 07/14/2015 07:36   Dg Chest Port 1 View  07/13/2015  CLINICAL DATA:  Status post left thoracentesis for recurrent malignant effusion EXAM: PORTABLE CHEST 1 VIEW COMPARISON:  07/11/2015 FINDINGS: Large amount of the left effusion has been removed. Patient's previous chest x-ray demonstrated complete opacification of the left hemi thorax. Following the thoracentesis, the patient has a moderate residual left hydro pneumothorax with pleural thickening of the lung related to incomplete re-expansion of the lung. Trachea is midline. Right lung remains clear. Residual lingula and left lower lobe collapse/ consolidation. Stable heart size and mediastinal contours. Atherosclerosis of the aorta evident. IMPRESSION: moderate-sized left hydro pneumothorax following large volume left thoracentesis compatible with incomplete re-expansion of the lung. Patient remains asymptomatic. These results were called by telephone at the time of interpretation on 07/13/2015 at 2:03 pm to Dr. Theodoro Grist , who verbally acknowledged these results. Electronically Signed   By: Jerilynn Mages.  Shick M.D.   On: 07/13/2015 14:04   Dg C-arm 1-60 Min-no Report  07/14/2015  CLINICAL DATA: port cath C-ARM 1-60 MINUTES Fluoroscopy was utilized by the requesting physician.  No radiographic interpretation.   US Thoracentesis Asp Pleural Space W/img Guide  07/13/2015  INDICATION: Recurrent malignant left pleural effusion, shortness of breath EXAM: ULTRASOUND GUIDED LEFT THORACENTESIS MEDICATIONS: 1% lidocaine locally COMPLICATIONS: None immediate. PROCEDURE: An ultrasound guided thoracentesis was thoroughly discussed with the patient and questions answered. The benefits, risks, alternatives and complications were also discussed. The patient understands and wishes to proceed with the procedure. Written consent was  obtained. Ultrasound was performed to localize and mark an adequate pocket of fluid in the left chest. The area was then prepped and draped in the normal sterile fashion. 1% Lidocaine was used for local anesthesia. Under ultrasound guidance a 6 Fr Safe-T-Centesis catheter was introduced. Thoracentesis was performed. The catheter was removed and a dressing applied. FINDINGS: A total of approximately 1.8 L of bloody pleural fluid was removed. Samples were sent to the laboratory as requested by the clinical team. IMPRESSION: Successful ultrasound guided left thoracentesis yielding 1.8 L of pleural fluid. Electronically Signed   By: Jerilynn Mages.  Shick M.D.   On: 07/13/2015 13:57    Assessment/Plan: 60 year old female one day status post left-sided thoracostomy and talc pleurodesis. Having significant pain issues. I will increase the frequency and which she can obtain her as needed oral pain medications today. Still has small air leak visualized in the drainage catheters. Continue tubes to suction today. Encourage incentive spirometer and ambulation as tolerated.   Clayburn Pert, MD FACS General Surgeon  07/15/2015

## 2015-07-15 NOTE — Plan of Care (Signed)
Problem: Pain Managment: Goal: General experience of comfort will improve Outcome: Not Progressing Patient's pain is uncontrolled by PRN IV pain medication, MD notified and new order obtained this shift.. Pt is currently able to rest with PRN pain medication but states that it is only giving good relief of pain for about an hour.

## 2015-07-15 NOTE — Plan of Care (Signed)
Problem: Bowel/Gastric: Goal: Will not experience complications related to bowel motility Outcome: Progressing VSS. Continue on 3L o2 per Bond with O2 sats in the mid 90's. Serosanguineous output via chest tube. Foley removed at 1200. Pt up to the bsc, unable to void. Bladder scan results at 1850 143m. Pain managed with dilaudid and roxicodone. Denies n/v. No BM during the shift. Pt had visitors today around the clock.

## 2015-07-15 NOTE — Progress Notes (Signed)
Charlene Williams   DOB:[birth year]   OJ#:[id number]    Subjective: Patient had pleurodesis/chest tube placement yesterday. Patient continues to be short of breath. Complains of chest pain/the site of the chest tube and also left chest wall.   ROS: no fevers.No diarrhea.   Objective:  Filed Vitals:   07/15/15 0942 07/15/15 1304  BP: 115/69 95/55  Pulse: 120 109  Temp:  97.9 F (36.6 C)  Resp: 20      Intake/Output Summary (Last 24 hours) at 07/15/15 1822 Last data filed at 07/15/15 1630  Gross per 24 hour  Intake    900 ml  Output   2670 ml  Net  -1770 ml    GENERAL: Well-nourished well-developed; Alert, no distress and comfortable.Accompanied by niece and nephew. EYES: no pallor or icterus OROPHARYNX: no thrush or ulceration;  NECK: supple, no masses felt LYMPH: no palpable lymphadenopathy in the cervical, axillary or inguinal regions LUNGS: Diminished breath sounds on the left lung ; No wheeze or crackles HEART/CVS: regular rate & rhythm and no murmurs; No lower extremity edema ABDOMEN: abdomen soft, non-tender and normal bowel sounds; distended. No hepatosplenomegaly. Musculoskeletal:no cyanosis of digits and no clubbing; 3x4 cm mass noted in Right arm  PSYCH: alert & oriented x 3 with fluent speech NEURO: no focal motor/sensory deficits SKIN: no rashes or significant lesions.  Labs:  Lab Results  Component Value Date   WBC 11.0 07/14/2015   HGB 13.5 07/14/2015   HCT 42.1 07/14/2015   MCV 85.7 07/14/2015   PLT 388 07/14/2015   NEUTROABS 1.8 07/23/2012    Lab Results  Component Value Date   NA 137 07/14/2015   K 4.8 07/14/2015   CL 106 07/14/2015   CO2 26 07/14/2015    Studies:  Dg Chest Port 1 View  07/14/2015  CLINICAL DATA:  Postop port check. EXAM: CHEST 1 VIEW; RF - DG C-ARM 1-60 MIN COMPARISON:  07/14/2015 FINDINGS: Left-sided power port tip to level of the superior vena cava. A left-sided chest tube has been placed with decreased size of left  pneumothorax. There has been re-expansion of left lung and smaller size of left pneumothorax. There is significant opacity throughout the left lung. Left pleural effusion is noted. The right lung is clear. Heart contour is obscured by the opacity at the left lung base. IMPRESSION: 1. Interval placement of left-sided Port-A-Cath. 2. Interval placement of left-sided chest tube. 3. Smaller left pneumothorax. 4. Pulmonary opacity and pleural effusion. Electronically Signed   By: Nolon Nations M.D.   On: 07/14/2015 15:57   Dg Chest Port 1 View  07/14/2015  CLINICAL DATA:  Follow-up left-sided pneumothorax, history of COPD and left pleural effusion. EXAM: PORTABLE CHEST 1 VIEW COMPARISON:  Chest x-ray of July 13, 2015 FINDINGS: The pneumothorax on the left is increased and occupies approximately 40-50% of the lung volume. There is pleural fluid on the left as well and dust this is a hydro pneumothorax. The right lung is clear. The mediastinum is not significantly shifted. The cardiac silhouette is not enlarged the pulmonary vascularity appears normal. The bony thorax is unremarkable. IMPRESSION: Interval increase in the size of the left-sided hydro pneumothorax which now all occupies 40-50% of the pleural space volume. Critical Value/emergent results were called by telephone at the time of interpretation on 07/14/2015 at 7:33 am to Tamala Fothergill, RN, who verbally acknowledged these results. No active disease. Electronically Signed   By: David  Martinique M.D.   On: 07/14/2015 07:36   Dg  C-arm 1-60 Min-no Report  07/14/2015  CLINICAL DATA: port cath C-ARM 1-60 MINUTES Fluoroscopy was utilized by the requesting physician.  No radiographic interpretation.    Assessment & Plan:   60 year old female patient with history of smoking with a recurrent left-sided pleural effusion- cytology positive for adenocarcinoma.  # Malignant left-sided pleural effusion recurrent- cytology positive for adenocarcinoma; TTF-1  positive; Suggestive of lung origin. Patient status post pleurodesis/chest tube placement for pneumothorax.  # Stage IV adenocarcinoma the lung- Plan outpatient Palliative chemotherapy.  # Pain secondary to pleurodesis/chest tube- patient is getting IV Dilaudid every 3 hours; with breakthrough oral oxycodone. Discussed with RN.    Cammie Sickle, MD 07/15/2015  6:22 PM

## 2015-07-15 NOTE — Progress Notes (Signed)
[facility] [place] NAME: Charlene Williams    MR#:  [id number]  DATE OF BIRTH:  [birth year]  SUBJECTIVE:    patient here due to her recurrent malignant pleural effusion status post pleurodesis and chest tube placement. Complaining of significant pain near the left chest tube site.  REVIEW OF SYSTEMS:    Review of Systems  Constitutional: Negative for fever and chills.  HENT: Negative for congestion and tinnitus.   Eyes: Negative for blurred vision and double vision.  Respiratory: Negative for cough, shortness of breath and wheezing.   Cardiovascular: Positive for chest pain. Negative for orthopnea and PND.  Gastrointestinal: Negative for nausea, vomiting, abdominal pain and diarrhea.  Genitourinary: Negative for dysuria and hematuria.  Neurological: Negative for dizziness, sensory change and focal weakness.  All other systems reviewed and are negative.   Nutrition: Regular Tolerating Diet: Yes Tolerating PT: Await evaluation   DRUG ALLERGIES:   Allergies  Allergen Reactions  . Ampicillin Hives and Other (See Comments)    Has patient had a PCN reaction causing immediate rash, facial/tongue/throat swelling, SOB or lightheadedness with hypotension: No Has patient had a PCN reaction causing severe rash involving mucus membranes or skin necrosis: No Has patient had a PCN reaction that required hospitalization No Has patient had a PCN reaction occurring within the last 10 years: Yes If all of the above answers are "NO", then may proceed with Cephalosporin use.  Marland Kitchen Penicillins Hives and Other (See Comments)    Has patient had a PCN reaction causing immediate rash, facial/tongue/throat swelling, SOB or lightheadedness with hypotension: No Has patient had a PCN reaction causing severe rash involving mucus membranes or skin necrosis: No Has patient had a PCN reaction that required hospitalization No Has patient had a PCN reaction occurring  within the last 10 years: Yes If all of the above answers are "NO", then may proceed with Cephalosporin use.    VITALS:  Blood pressure 115/69, pulse 120, temperature 99.2 F (37.3 C), temperature source Oral, resp. rate 20, height '5\' 3"'$  (1.6 m), weight 73.619 kg (162 lb 4.8 oz), SpO2 95 %.  PHYSICAL EXAMINATION:   Physical Exam  GENERAL:  60 y.o.-year-old patient lying in the bed in some mild-moderate pain near the chest tube site.Marland Kitchen  EYES: Pupils equal, round, reactive to light and accommodation. No scleral icterus. Extraocular muscles intact.  HEENT: Head atraumatic, normocephalic. Oropharynx and nasopharynx clear.  NECK:  Supple, no jugular venous distention. No thyroid enlargement, no tenderness.  LUNGS: Normal breath sounds bilaterally, no wheezing,  Rhonchi, rales at left lower base. No use of accessory muscles of respiration. Positive chest tube to the left side. CARDIOVASCULAR: S1, S2 normal. No murmurs, rubs, or gallops.  ABDOMEN: Soft, nontender, nondistended. Bowel sounds present. No organomegaly or mass.  EXTREMITIES: No cyanosis, clubbing or edema b/l.    NEUROLOGIC: Cranial nerves II through XII are intact. No focal Motor or sensory deficits b/l.   PSYCHIATRIC: The patient is alert and oriented x 3.  SKIN: No obvious rash, lesion, or ulcer.    LABORATORY PANEL:   CBC  Recent Labs Lab 07/14/15 1822  WBC 11.0  HGB 13.5  HCT 42.1  PLT 388   ------------------------------------------------------------------------------------------------------------------  Chemistries   Recent Labs Lab 07/14/15 1822 07/15/15 0532  NA 137  --   K 4.8  --   CL 106  --   CO2 26  --   GLUCOSE 161*  --   BUN 11  --  CREATININE 0.90 0.76  CALCIUM 8.7*  --    ------------------------------------------------------------------------------------------------------------------  Cardiac Enzymes No results for input(s): TROPONINI in the last 168  hours. ------------------------------------------------------------------------------------------------------------------  RADIOLOGY:  Dg Chest Port 1 View  07/14/2015  CLINICAL DATA:  Postop port check. EXAM: CHEST 1 VIEW; RF - DG C-ARM 1-60 MIN COMPARISON:  07/14/2015 FINDINGS: Left-sided power port tip to level of the superior vena cava. A left-sided chest tube has been placed with decreased size of left pneumothorax. There has been re-expansion of left lung and smaller size of left pneumothorax. There is significant opacity throughout the left lung. Left pleural effusion is noted. The right lung is clear. Heart contour is obscured by the opacity at the left lung base. IMPRESSION: 1. Interval placement of left-sided Port-A-Cath. 2. Interval placement of left-sided chest tube. 3. Smaller left pneumothorax. 4. Pulmonary opacity and pleural effusion. Electronically Signed   By: Nolon Nations M.D.   On: 07/14/2015 15:57   Dg Chest Port 1 View  07/14/2015  CLINICAL DATA:  Follow-up left-sided pneumothorax, history of COPD and left pleural effusion. EXAM: PORTABLE CHEST 1 VIEW COMPARISON:  Chest x-ray of July 13, 2015 FINDINGS: The pneumothorax on the left is increased and occupies approximately 40-50% of the lung volume. There is pleural fluid on the left as well and dust this is a hydro pneumothorax. The right lung is clear. The mediastinum is not significantly shifted. The cardiac silhouette is not enlarged the pulmonary vascularity appears normal. The bony thorax is unremarkable. IMPRESSION: Interval increase in the size of the left-sided hydro pneumothorax which now all occupies 40-50% of the pleural space volume. Critical Value/emergent results were called by telephone at the time of interpretation on 07/14/2015 at 7:33 am to Tamala Fothergill, RN, who verbally acknowledged these results. No active disease. Electronically Signed   By: David  Martinique M.D.   On: 07/14/2015 07:36   Dg Chest Port 1  View  07/13/2015  CLINICAL DATA:  Status post left thoracentesis for recurrent malignant effusion EXAM: PORTABLE CHEST 1 VIEW COMPARISON:  07/11/2015 FINDINGS: Large amount of the left effusion has been removed. Patient's previous chest x-ray demonstrated complete opacification of the left hemi thorax. Following the thoracentesis, the patient has a moderate residual left hydro pneumothorax with pleural thickening of the lung related to incomplete re-expansion of the lung. Trachea is midline. Right lung remains clear. Residual lingula and left lower lobe collapse/ consolidation. Stable heart size and mediastinal contours. Atherosclerosis of the aorta evident. IMPRESSION: moderate-sized left hydro pneumothorax following large volume left thoracentesis compatible with incomplete re-expansion of the lung. Patient remains asymptomatic. These results were called by telephone at the time of interpretation on 07/13/2015 at 2:03 pm to Dr. Theodoro Grist , who verbally acknowledged these results. Electronically Signed   By: Jerilynn Mages.  Shick M.D.   On: 07/13/2015 14:04   Dg C-arm 1-60 Min-no Report  07/14/2015  CLINICAL DATA: port cath C-ARM 1-60 MINUTES Fluoroscopy was utilized by the requesting physician.  No radiographic interpretation.   US Thoracentesis Asp Pleural Space W/img Guide  07/13/2015  INDICATION: Recurrent malignant left pleural effusion, shortness of breath EXAM: ULTRASOUND GUIDED LEFT THORACENTESIS MEDICATIONS: 1% lidocaine locally COMPLICATIONS: None immediate. PROCEDURE: An ultrasound guided thoracentesis was thoroughly discussed with the patient and questions answered. The benefits, risks, alternatives and complications were also discussed. The patient understands and wishes to proceed with the procedure. Written consent was obtained. Ultrasound was performed to localize and mark an adequate pocket of fluid in the left chest. The  area was then prepped and draped in the normal sterile fashion. 1% Lidocaine  was used for local anesthesia. Under ultrasound guidance a 6 Fr Safe-T-Centesis catheter was introduced. Thoracentesis was performed. The catheter was removed and a dressing applied. FINDINGS: A total of approximately 1.8 L of bloody pleural fluid was removed. Samples were sent to the laboratory as requested by the clinical team. IMPRESSION: Successful ultrasound guided left thoracentesis yielding 1.8 L of pleural fluid. Electronically Signed   By: Jerilynn Mages.  Shick M.D.   On: 07/13/2015 13:57     ASSESSMENT AND PLAN:   60 year old female with past medical history of hypertension, hyperlipidemia, diabetes, COPD, respectively sleep apnea, migraines, chronic pain who presents to the hospital shortness of breath and left-sided pleuritic chest pain.  #1. 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  - CT scan of the chest and abdomen revealed nodular pleural thickening, questionable left lower lobe mass, patient underwent left thoracentesis 07/13/2015, unfortunate complicated by pneumothorax -Seen by Dr. Genevive Bi and status post left-sided chest tube placement and pleurodesis yesterday. Still having significant pain in the left chest tube site. We'll adjust pain meds. Continue further care as per general surgery.  #2 . Anxiety - cont. Xanax, Remeron.   #3. Essential hypertension - well controlled.  - cont. Enalapril, Metoprolol.   #4. Diabetes mellitus - BS labile but improving.  - cont. SSI.  Carb control diet.    #5. COPD , no obvious exacerbation, off steroids, continue albuterol and and Pulmicort inhalation therapy - cont. O2 as tolerated.   #6. LLL mass - likely malignant given recurrent pleural effusion which is malignant. -Off IV antibiotics now. Pneumonia ruled out.  #7. Acute on chronic respiratory failure with hypoxia-due to recurrent pleural effusion, pneumothorax status  post ultrasound-guided thoracentesis and now status post chest tube placement with pleurodesis. -Continue oxygen support as tolerated.     All the records are reviewed and case discussed with Care Management/Social Workerr. Management plans discussed with the patient, family and they are in agreement.  CODE STATUS: Full  DVT Prophylaxis: Lovenox  TOTAL TIME TAKING CARE OF THIS PATIENT: 30 minutes.   POSSIBLE D/C IN 2-3 DAYS, DEPENDING ON CLINICAL CONDITION.   Henreitta Leber M.D on 07/15/2015 at 11:50 AM  Between 7am to 6pm - Pager - [phone]  After 6pm go to [email or web address] - password [email or web address] [facility]  [facility]  Office  [phone]  CC: Primary care physician; Kasandra Knudsen, NP

## 2015-07-16 ENCOUNTER — Inpatient Hospital Stay: Payer: Medicare Other

## 2015-07-16 DIAGNOSIS — R509 Fever, unspecified: Secondary | ICD-10-CM

## 2015-07-16 LAB — URINALYSIS COMPLETE WITH MICROSCOPIC (ARMC ONLY)
Bilirubin Urine: NEGATIVE
Glucose, UA: NEGATIVE mg/dL
Hgb urine dipstick: NEGATIVE
Ketones, ur: NEGATIVE mg/dL
Leukocytes, UA: NEGATIVE
Nitrite: NEGATIVE
Protein, ur: NEGATIVE mg/dL
Specific Gravity, Urine: 1.006 (ref 1.005–1.030)
pH: 6 (ref 5.0–8.0)

## 2015-07-16 LAB — CBC
HCT: 35.7 % (ref 35.0–47.0)
Hemoglobin: 11.6 g/dL — ABNORMAL LOW (ref 12.0–16.0)
MCH: 28.1 pg (ref 26.0–34.0)
MCHC: 32.5 g/dL (ref 32.0–36.0)
MCV: 86.3 fL (ref 80.0–100.0)
PLATELETS: 281 10*3/uL (ref 150–440)
RBC: 4.14 MIL/uL (ref 3.80–5.20)
RDW: 14.7 % — AB (ref 11.5–14.5)
WBC: 13.3 10*3/uL — AB (ref 3.6–11.0)

## 2015-07-16 LAB — GLUCOSE, CAPILLARY
GLUCOSE-CAPILLARY: 158 mg/dL — AB (ref 65–99)
Glucose-Capillary: 129 mg/dL — ABNORMAL HIGH (ref 65–99)
Glucose-Capillary: 193 mg/dL — ABNORMAL HIGH (ref 65–99)
Glucose-Capillary: 156 mg/dL — ABNORMAL HIGH (ref 65–99)

## 2015-07-16 MED ORDER — HYDROMORPHONE HCL 1 MG/ML IJ SOLN
0.5000 mg | INTRAMUSCULAR | Status: DC | PRN
  Administered 2015-07-16 – 2015-07-19 (×15): 0.5 mg via INTRAVENOUS
  Filled 2015-07-16 (×15): qty 1

## 2015-07-16 MED ORDER — OXYCODONE HCL 5 MG PO TABS
5.0000 mg | ORAL_TABLET | Freq: Four times a day (QID) | ORAL | Status: DC | PRN
  Administered 2015-07-16 – 2015-07-18 (×6): 10 mg via ORAL
  Administered 2015-07-19: 11:00:00 5 mg via ORAL
  Administered 2015-07-19: 10 mg via ORAL
  Filled 2015-07-16: qty 2
  Filled 2015-07-16: qty 1
  Filled 2015-07-16 (×6): qty 2

## 2015-07-16 MED ORDER — POLYETHYLENE GLYCOL 3350 17 G PO PACK
17.0000 g | PACK | Freq: Every day | ORAL | Status: DC
  Administered 2015-07-16 – 2015-07-19 (×3): 17 g via ORAL
  Filled 2015-07-16 (×3): qty 1

## 2015-07-16 MED ORDER — DOCUSATE SODIUM 100 MG PO CAPS
100.0000 mg | ORAL_CAPSULE | Freq: Two times a day (BID) | ORAL | Status: DC
  Administered 2015-07-16 – 2015-07-19 (×6): 100 mg via ORAL
  Filled 2015-07-16 (×6): qty 1

## 2015-07-16 MED ORDER — LEVOFLOXACIN IN D5W 750 MG/150ML IV SOLN
750.0000 mg | Freq: Every day | INTRAVENOUS | Status: DC
Start: 2015-07-16 — End: 2015-07-19
  Administered 2015-07-16 – 2015-07-18 (×3): 750 mg via INTRAVENOUS
  Filled 2015-07-16 (×4): qty 150

## 2015-07-16 NOTE — Progress Notes (Signed)
2 Days Post-Op   Subjective:  Patient reports pain under better control with change in pain medication yesterday. Is able to sleep overnight. Has been febrile and without incident spirometer. Tolerating a diet and a much.  Vital signs in last 24 hours: Temp:  [97.9 F (36.6 C)-101.1 F (38.4 C)] 101.1 F (38.4 C) (04/23 0826) Pulse Rate:  [73-120] 107 (04/23 0826) Resp:  [16-20] 18 (04/23 0826) BP: (95-115)/(52-69) 100/52 mmHg (04/23 0826) SpO2:  [93 %-98 %] 94 % (04/23 0826) Weight:  [81.149 kg (178 lb 14.4 oz)] 81.149 kg (178 lb 14.4 oz) (04/23 0500) Last BM Date: 07/11/15  Intake/Output from previous day: 04/22 0701 - 04/23 0700 In: 120 [P.O.:120] Out: 1610 [Urine:1400; Chest Tube:210]  Physical exam: Gen.: No acute distress Chest: Diminished breath sounds on the left but clear. Tubes in place without evidence of erythema or purulence. Titling present on deep inspiration without any obvious air leak. Heart: Tachycardic GI: Abdomen is soft and nontender but minimally distended.  Lab Results:  CBC  Recent Labs  07/14/15 1822 07/16/15 0430  WBC 11.0 13.3*  HGB 13.5 11.6*  HCT 42.1 35.7  PLT 388 281   CMP     Component Value Date/Time   NA 137 07/14/2015 1822   NA 141 02/15/2013 1152   K 4.8 07/14/2015 1822   K 4.1 02/15/2013 1152   CL 106 07/14/2015 1822   CL 115* 02/15/2013 1152   CO2 26 07/14/2015 1822   CO2 21 02/15/2013 1152   GLUCOSE 161* 07/14/2015 1822   GLUCOSE 85 02/15/2013 1152   BUN 11 07/14/2015 1822   BUN 20* 02/15/2013 1152   CREATININE 0.76 07/15/2015 0532   CREATININE 0.96 02/15/2013 1152   CALCIUM 8.7* 07/14/2015 1822   CALCIUM 9.0 02/15/2013 1152   PROT 6.9 06/26/2015 0040   PROT 7.8 07/18/2012 1025   ALBUMIN 3.3* 06/26/2015 0040   ALBUMIN 4.0 07/18/2012 1025   AST 20 06/26/2015 0040   AST 20 07/18/2012 1025   ALT 15 06/26/2015 0040   ALT 20 07/18/2012 1025   ALKPHOS 93 06/26/2015 0040   ALKPHOS 116 07/18/2012 1025   BILITOT 0.5  06/26/2015 0040   BILITOT 0.5 07/18/2012 1025   GFRNONAA >60 07/15/2015 0532   GFRNONAA >60 02/15/2013 1152   GFRAA >60 07/15/2015 0532   GFRAA >60 02/15/2013 1152   PT/INR  Recent Labs  07/13/15 1006  LABPROT 13.5  INR 1.01    Studies/Results: Dg Chest Port 1 View  07/14/2015  CLINICAL DATA:  Postop port check. EXAM: CHEST 1 VIEW; RF - DG C-ARM 1-60 MIN COMPARISON:  07/14/2015 FINDINGS: Left-sided power port tip to level of the superior vena cava. A left-sided chest tube has been placed with decreased size of left pneumothorax. There has been re-expansion of left lung and smaller size of left pneumothorax. There is significant opacity throughout the left lung. Left pleural effusion is noted. The right lung is clear. Heart contour is obscured by the opacity at the left lung base. IMPRESSION: 1. Interval placement of left-sided Port-A-Cath. 2. Interval placement of left-sided chest tube. 3. Smaller left pneumothorax. 4. Pulmonary opacity and pleural effusion. Electronically Signed   By: Nolon Nations M.D.   On: 07/14/2015 15:57   Dg C-arm 1-60 Min-no Report  07/14/2015  CLINICAL DATA: port cath C-ARM 1-60 MINUTES Fluoroscopy was utilized by the requesting physician.  No radiographic interpretation.    Assessment/Plan: 60 year old female 2 days status post left-sided pleural biopsy, talc pleurodesis, Port-A-Cath insertion.  Febrile overnight likely related to atelectasis. Need to work on aggressive pulmonary toilet. Incentive spirometer and respiratory therapy. Continue tubes to suction today to help with reapproximation of the lung tissue to the chest wall. Encourage ambulation and oral intake as tolerated.   Clayburn Pert, MD FACS General Surgeon  07/16/2015

## 2015-07-16 NOTE — Plan of Care (Signed)
Problem: Pain Managment: Goal: General experience of comfort will improve Outcome: Progressing Pt drowsy in am. Pain meds frequency/dose changed. Pt still c/o pain level 9 at chest tube insertion site, but requested pain meds less often.  Problem: Physical Regulation: Goal: Ability to maintain clinical measurements within normal limits will improve Outcome: Progressing In am oral temp 101.1, tylenol given with improvement. BP 100/52, BP meds held today. VSS. Continue O2 on 2L with O2 sats at 92%. Pt uses incentive spirometer. 163m serosanguineous output via chest tube during the shift. Miralax and colace given, but no BM yet.

## 2015-07-16 NOTE — Progress Notes (Signed)
[place] at [place] NAME: Charlene Williams    MR#:  [id number]  DATE OF BIRTH:  1955/08/29  SUBJECTIVE:    patient here due to her recurrent malignant pleural effusion status post pleurodesis and chest tube placement. Patient is a bit more lethargic today. Pain is improved and she slept well last night. Noted to have a fever of 101 this a.m.   REVIEW OF SYSTEMS:    Review of Systems  Constitutional: Positive for fever. Negative for chills.  HENT: Negative for congestion and tinnitus.   Eyes: Negative for blurred vision and double vision.  Respiratory: Negative for cough, shortness of breath and wheezing.   Cardiovascular: Positive for chest pain (Near the chest tube site). Negative for orthopnea and PND.  Gastrointestinal: Negative for nausea, vomiting, abdominal pain and diarrhea.  Genitourinary: Negative for dysuria and hematuria.  Neurological: Negative for dizziness, sensory change and focal weakness.  All other systems reviewed and are negative.   Nutrition: Regular Tolerating Diet: Yes Tolerating PT: Await evaluation   DRUG ALLERGIES:   Allergies  Allergen Reactions  . Ampicillin Hives and Other (See Comments)    Has patient had a PCN reaction causing immediate rash, facial/tongue/throat swelling, SOB or lightheadedness with hypotension: No Has patient had a PCN reaction causing severe rash involving mucus membranes or skin necrosis: No Has patient had a PCN reaction that required hospitalization No Has patient had a PCN reaction occurring within the last 10 years: Yes If all of the above answers are "NO", then may proceed with Cephalosporin use.  Marland Kitchen Penicillins Hives and Other (See Comments)    Has patient had a PCN reaction causing immediate rash, facial/tongue/throat swelling, SOB or lightheadedness with hypotension: No Has patient had a PCN reaction causing severe rash involving mucus membranes or skin necrosis: No Has  patient had a PCN reaction that required hospitalization No Has patient had a PCN reaction occurring within the last 10 years: Yes If all of the above answers are "NO", then may proceed with Cephalosporin use.    VITALS:  Blood pressure 102/50, pulse 98, temperature 100.2 F (37.9 C), temperature source Oral, resp. rate 17, height '5\' 3"'$  (1.6 m), weight 81.149 kg (178 lb 14.4 oz), SpO2 92 %.  PHYSICAL EXAMINATION:   Physical Exam  GENERAL:  60 y.o.-year-old patient lying in the bed Lethargic/sedated but arousable.  EYES: Pupils pinpoint but reactive, round, reactive to light and accommodation. No scleral icterus. Extraocular muscles intact.  HEENT: Head atraumatic, normocephalic. Oropharynx and nasopharynx clear.  NECK:  Supple, no jugular venous distention. No thyroid enlargement, no tenderness.  LUNGS: Normal breath sounds bilaterally, no wheezing,  Rhonchi, rales at left lower base. No use of accessory muscles of respiration. Positive chest tube to the left side. CARDIOVASCULAR: S1, S2 normal. No murmurs, rubs, or gallops.  ABDOMEN: Soft, nontender, nondistended. Bowel sounds present. No organomegaly or mass.  EXTREMITIES: No cyanosis, clubbing or edema b/l.    NEUROLOGIC: Cranial nerves II through XII are intact. No focal Motor or sensory deficits b/l.   PSYCHIATRIC: The patient is alert and oriented x 3.  SKIN: No obvious rash, lesion, or ulcer.    LABORATORY PANEL:   CBC  Recent Labs Lab 07/16/15 0430  WBC 13.3*  HGB 11.6*  HCT 35.7  PLT 281   ------------------------------------------------------------------------------------------------------------------  Chemistries   Recent Labs Lab 07/14/15 1822 07/15/15 0532  NA 137  --   K 4.8  --   CL 106  --  CO2 26  --   GLUCOSE 161*  --   BUN 11  --   CREATININE 0.90 0.76  CALCIUM 8.7*  --     ------------------------------------------------------------------------------------------------------------------  Cardiac Enzymes No results for input(s): TROPONINI in the last 168 hours. ------------------------------------------------------------------------------------------------------------------  RADIOLOGY:  Dg Chest 2 View  07/16/2015  CLINICAL DATA:  Left lung adenocarcinoma with malignant left pleural effusion. Status post chest tube placement and pleurodesis. Shortness of breath. EXAM: CHEST  2 VIEW COMPARISON:  07/14/2015 FINDINGS: Small bore left chest tube and left-sided Port-A-Cath remain in place. A small left pleural effusion is stable. No pneumothorax visualized. Left lower lobe consolidation or collapse is unchanged. Right lung is clear. Heart size is stable. IMPRESSION: Stable small left pleural effusion and left lower lobe consolidation or collapse. No pneumothorax visualized. Electronically Signed   By: Earle Gell M.D.   On: 07/16/2015 11:15   Dg Chest Port 1 View  07/14/2015  CLINICAL DATA:  Postop port check. EXAM: CHEST 1 VIEW; RF - DG C-ARM 1-60 MIN COMPARISON:  07/14/2015 FINDINGS: Left-sided power port tip to level of the superior vena cava. A left-sided chest tube has been placed with decreased size of left pneumothorax. There has been re-expansion of left lung and smaller size of left pneumothorax. There is significant opacity throughout the left lung. Left pleural effusion is noted. The right lung is clear. Heart contour is obscured by the opacity at the left lung base. IMPRESSION: 1. Interval placement of left-sided Port-A-Cath. 2. Interval placement of left-sided chest tube. 3. Smaller left pneumothorax. 4. Pulmonary opacity and pleural effusion. Electronically Signed   By: Nolon Nations M.D.   On: 07/14/2015 15:57   Dg C-arm 1-60 Min-no Report  07/14/2015  CLINICAL DATA: port cath C-ARM 1-60 MINUTES Fluoroscopy was utilized by the requesting physician.  No  radiographic interpretation.     ASSESSMENT AND PLAN:   60 year old female with past medical history of hypertension, hyperlipidemia, diabetes, COPD, respectively sleep apnea, migraines, chronic pain who presents to the hospital shortness of breath and left-sided pleuritic chest pain.  #1. 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  - CT scan of the chest and abdomen revealed nodular pleural thickening, questionable left lower lobe mass, patient underwent left thoracentesis 94/17/4081,  complicated by pneumothorax -Seen by Dr. Genevive Bi and status post left-sided chest tube placement and pleurodesis. Pain near CT site has improved.  Will taper pain meds as he she is too sedated.   #2 Fever - pt. Had a fever of 101 this a.m. Source unclear.  - CXR showing possible pneumonia and will start Levaquin.  Check BC X 2. UA.   - follow fever curve.   #3 . Anxiety - cont. Xanax, Remeron.   #4. Essential hypertension - well controlled.  - cont. Enalapril, Metoprolol.   #5. Diabetes mellitus - BS stable.  - cont. SSI.  Carb control diet.    #6. COPD , no obvious exacerbation, off steroids, continue albuterol and and Pulmicort inhalation therapy - cont. O2 as tolerated.   #7. LLL mass - likely malignant given recurrent pleural effusion which is malignant. -Off IV antibiotics now. Pneumonia ruled out. - appreciate Oncology consult plan for treatment when resp. Issues improved.   #8. Acute on chronic respiratory failure with hypoxia-due to recurrent pleural effusion, pneumothorax status post ultrasound-guided thoracentesis and now status post chest tube placement with pleurodesis. -Continue oxygen  support as wean as tolerated.   Await PT eval.     All the records are reviewed and case discussed with Care Management/Social Workerr. Management plans discussed with the  patient, family and they are in agreement.  CODE STATUS: Full  DVT Prophylaxis: Lovenox  TOTAL TIME TAKING CARE OF THIS PATIENT: 30 minutes.   POSSIBLE D/C IN 2-3 DAYS, DEPENDING ON CLINICAL CONDITION.   Henreitta Leber M.D on 07/16/2015 at 11:24 AM  Between 7am to 6pm - Pager - [phone]  After 6pm go to [email or web address] - password [email or web address] [facility]  [facility]  Office  [phone]  CC: Primary care physician; Kasandra Knudsen, NP

## 2015-07-16 NOTE — Consult Note (Signed)
Pharmacy Antibiotic Note  Charlene Williams is a 60 y.o. female admitted on 07/11/2015 with pneumonia.  Pharmacy has been consulted for levofloxacin dosing. Patient has malignant left sided pleural effusion- adenocarcinoma, s/p chest tube placement. Chest x-ray shows consolidation.  Plan: levofloxacin '750mg'$  IV q 24 hours  Height: '5\' 3"'$  (160 cm) Weight: 178 lb 14.4 oz (81.149 kg) IBW/kg (Calculated) : 52.4  Temp (24hrs), Avg:99.9 F (37.7 C), Min:97.9 F (36.6 C), Max:101.1 F (38.4 C)   Recent Labs Lab 07/11/15 1700 07/12/15 0527 07/14/15 1822 07/15/15 0532 07/16/15 0430  WBC 7.4 8.6 11.0  --  13.3*  CREATININE 0.88 0.90 0.90 0.76  --     Estimated Creatinine Clearance: 74.5 mL/min (by C-G formula based on Cr of 0.76).    Allergies  Allergen Reactions  . Ampicillin Hives and Other (See Comments)    Has patient had a PCN reaction causing immediate rash, facial/tongue/throat swelling, SOB or lightheadedness with hypotension: No Has patient had a PCN reaction causing severe rash involving mucus membranes or skin necrosis: No Has patient had a PCN reaction that required hospitalization No Has patient had a PCN reaction occurring within the last 10 years: Yes If all of the above answers are "NO", then may proceed with Cephalosporin use.  Marland Kitchen Penicillins Hives and Other (See Comments)    Has patient had a PCN reaction causing immediate rash, facial/tongue/throat swelling, SOB or lightheadedness with hypotension: No Has patient had a PCN reaction causing severe rash involving mucus membranes or skin necrosis: No Has patient had a PCN reaction that required hospitalization No Has patient had a PCN reaction occurring within the last 10 years: Yes If all of the above answers are "NO", then may proceed with Cephalosporin use.    Antimicrobials this admission: levofloxacin 4/23 >>   Dose adjustments this admission:  Microbiology results: 4/23 BCx: needs to be collected   Thank  you for allowing pharmacy to be a part of this patient's care.  Ramond Dial, Pharm.D Clinical Pharmacist 07/16/2015 11:38 AM

## 2015-07-16 NOTE — Plan of Care (Signed)
Problem: Nutrition: Goal: Adequate nutrition will be maintained Outcome: Not Progressing Patient has decreased appetite, continue to encourage PO intake.  Problem: Bowel/Gastric: Goal: Will not experience complications related to bowel motility Outcome: Not Progressing Last BM was 04/18. Patient states that she does not feel constipated.

## 2015-07-16 NOTE — Progress Notes (Signed)
Charlene Williams   DOB:Dec 17, 1955   KN#:[id number]    Subjective: Patient had pleurodesis/chest tube placement; POD #2. Patient continues to be short of breath. Complains of chest pain/the site of the chest tube and also left chest wall.; However she has been able to sleep well at night. Has received Dilaudid. One of last night.   ROS: No diarrhea. Constipation.  Objective:  Filed Vitals:   07/16/15 0826 07/16/15 0945  BP: 100/52 102/50  Pulse: 107 98  Temp: 101.1 F (38.4 C) 100.2 F (37.9 C)  Resp: 18 17     Intake/Output Summary (Last 24 hours) at 07/16/15 1016 Last data filed at 07/16/15 0900  Gross per 24 hour  Intake    120 ml  Output   1610 ml  Net  -1490 ml    GENERAL: Well-nourished well-developed; Alert, no distress and comfortableAlone. Sleeping comfortably. EYES: no pallor or icterus OROPHARYNX: no thrush or ulceration;  NECK: supple, no masses felt LYMPH: no palpable lymphadenopathy in the cervical, axillary or inguinal regions LUNGS: Diminished breath sounds on the left lung ; No wheeze or crackles HEART/CVS: regular rate & rhythm and no murmurs; No lower extremity edema ABDOMEN: abdomen soft, non-tender and normal bowel sounds; distended. No hepatosplenomegaly. Musculoskeletal:no cyanosis of digits and no clubbing; 3x4 cm mass noted in Right arm  PSYCH: alert & oriented x 3 with fluent speech NEURO: no focal motor/sensory deficits SKIN: no rashes or significant lesions.  Labs:  Lab Results  Component Value Date   WBC 13.3* 07/16/2015   HGB 11.6* 07/16/2015   HCT 35.7 07/16/2015   MCV 86.3 07/16/2015   PLT 281 07/16/2015   NEUTROABS 1.8 07/23/2012    Lab Results  Component Value Date   NA 137 07/14/2015   K 4.8 07/14/2015   CL 106 07/14/2015   CO2 26 07/14/2015    Studies:  Dg Chest Port 1 View  07/14/2015  CLINICAL DATA:  Postop port check. EXAM: CHEST 1 VIEW; RF - DG C-ARM 1-60 MIN COMPARISON:  07/14/2015 FINDINGS: Left-sided power port tip  to level of the superior vena cava. A left-sided chest tube has been placed with decreased size of left pneumothorax. There has been re-expansion of left lung and smaller size of left pneumothorax. There is significant opacity throughout the left lung. Left pleural effusion is noted. The right lung is clear. Heart contour is obscured by the opacity at the left lung base. IMPRESSION: 1. Interval placement of left-sided Port-A-Cath. 2. Interval placement of left-sided chest tube. 3. Smaller left pneumothorax. 4. Pulmonary opacity and pleural effusion. Electronically Signed   By: Nolon Nations M.D.   On: 07/14/2015 15:57   Dg C-arm 1-60 Min-no Report  07/14/2015  CLINICAL DATA: port cath C-ARM 1-60 MINUTES Fluoroscopy was utilized by the requesting physician.  No radiographic interpretation.    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 Patient status post pleurodesis/chest tube placement for pneumothorax.  # Stage IV adenocarcinoma the lung- Plan outpatient Palliative chemotherapy.  # Pain secondary to pleurodesis/chest tube- patient is getting IV Dilaudid every 3 hours; with breakthrough oral oxycodone. Agree with primary team regarding pain medications as patient resting most of the time. . Aggressive pulmonary toilet/ambulation. Discussed with the patient.   # Fever- likely secondary to inflammation from pleurodesis/atelectasis. Pulmonary toilet.  # Also, discussed with Dr.Sainani. Discussed with the nurse  Cammie Sickle, MD 07/16/2015  10:16 AM

## 2015-07-17 ENCOUNTER — Encounter: Payer: Self-pay | Admitting: Cardiothoracic Surgery

## 2015-07-17 LAB — GLUCOSE, CAPILLARY
GLUCOSE-CAPILLARY: 157 mg/dL — AB (ref 65–99)
GLUCOSE-CAPILLARY: 100 mg/dL — AB (ref 65–99)
Glucose-Capillary: 160 mg/dL — ABNORMAL HIGH (ref 65–99)
Glucose-Capillary: 114 mg/dL — ABNORMAL HIGH (ref 65–99)

## 2015-07-17 MED ORDER — KETOROLAC TROMETHAMINE 30 MG/ML IJ SOLN
30.0000 mg | Freq: Once | INTRAMUSCULAR | Status: AC
  Administered 2015-07-17: 15:00:00 30 mg via INTRAVENOUS
  Filled 2015-07-17: qty 1

## 2015-07-17 MED ORDER — SENNA 8.6 MG PO TABS
2.0000 | ORAL_TABLET | Freq: Every day | ORAL | Status: DC
  Administered 2015-07-17 – 2015-07-19 (×2): 17.2 mg via ORAL
  Filled 2015-07-17 (×2): qty 2

## 2015-07-17 NOTE — Clinical Social Work Note (Signed)
Clinical Social Work Assessment  Patient Details  Name: Charlene Williams MRN: [id number] Date of Birth: 1956/01/07  Date of referral:  07/17/15               Reason for consult:  Facility Placement, Mental Health Concerns                Permission sought to share information with:  Family Supports, Case Manager Permission granted to share information::  Yes, Verbal Permission Granted  Name::     Rollene Fare, Air traffic controller with [place]   Housing/Transportation Living arrangements for the past 2 months:  Apartment Source of Information:  Patient, Case Manager Patient Interpreter Needed:  None Criminal Activity/Legal Involvement Pertinent to Current Situation/Hospitalization:  No - Comment as needed Significant Relationships:  Adult Children, Siblings, Mental Health Provider, Other Family Members Lives with:  Self Do you feel safe going back to the place where you live?  Yes Need for family participation in patient care:  No (Coment)  Care giving concerns:  Pt has some social issues that could impact her care.   Social Worker assessment / plan:  CSW met with pt to address consult. Pt gave permission for CSW to speak with her mental health worker. CSW introduced herself and explained role of social work. CSW also explained the process of placement at a SNF for both STR and LTC, as well as an ALF. Per pt's mental health case manager, pt has questionable housing, however pt stated that she has a place to return to and recently moved. Pt's mental health worker shared that living with her daughter has not been a positive situation due to not having adequate food. Pt is currently not working, however in the past was the primary support for her family.   PT eval is pending. Pt has declined SNF and ALF placement. Pt reported that she is open to [facility] and [facility], however they do no have her new address. CSW recommends a Home Health SW to assist with placement, if pt 's changes her mind in the  future.   Pt just found out that her cancer is more advanced than she initially thought. Pt is questioning her options for treatment. CSW provided supportive counseling around the new information about her cancer. CSW has asked for a Palliative Care Consult to assist in Goals of Care.   CSW will continue to follow.   Employment status:  Disabled (Comment on whether or not currently receiving Disability) Insurance information:  Medicare, Medicaid [place] PT Recommendations:  Not assessed at this time Information / Referral to community resources:  [place], Other (Comment Required) (ALFs)  Patient/Family's Response to care:  Pt was appreciative of CSW support.   Patient/Family's Understanding of and Emotional Response to Diagnosis, Current Treatment, and Prognosis:  Pt is currently unsure of what type of treatment she would like.   Emotional Assessment Appearance:  Appears stated age Attitude/Demeanor/Rapport:  Crying Affect (typically observed):  Tearful/Crying Orientation:  Oriented to Self, Oriented to Place, Oriented to  Time, Oriented to Situation Alcohol / Substance use:  Never Used Psych involvement (Current and /or in the community):  No (Comment)  Discharge Needs  Concerns to be addressed:  Adjustment to Illness Readmission within the last 30 days:  No Current discharge risk:  Chronically ill Barriers to Discharge:  Continued Medical Work up   [employer], LCSW 07/17/2015, 11:33 AM

## 2015-07-17 NOTE — Progress Notes (Signed)
Still complain of pain left side  CT drained 200 cc per last 24 hours.  No air leak seen Redressed all wounds - clean and dry  Will need home oxygen Will need teaching regarding her PleurX catheter care Will need to see me in one week in the office for suture removal  Will place chest tube to water seal.  Repeat CXRay tomorrow morning and if OK, remove chest tube.  [employer].

## 2015-07-17 NOTE — Plan of Care (Signed)
Problem: Bowel/Gastric: Goal: Will not experience complications related to bowel motility Outcome: Not Progressing Patients last BM was 07/11/2015. Mirilax given yesterday with no results as of yet. Colace also being given BID with no improvement noted. Pt abd is taut and distended. Pt up to [facility] this morning to try and have BM. No results, continue to monitor.

## 2015-07-17 NOTE — Progress Notes (Signed)
Pt's chest tube drainage 30 ml this shift.  Pt ambulated around the nursing station X1.

## 2015-07-17 NOTE — Care Management Important Message (Signed)
Important Message  Patient Details  Name: Charlene Williams MRN: [id number] Date of Birth: 11-May-1955   Medicare Important Message Given:  Yes    Juliann Pulse A Vidyuth Belsito 07/17/2015, 1:14 PM

## 2015-07-17 NOTE — Progress Notes (Signed)
[place] [facility] [place] NAME: Charlene Williams    MR#:  [id number]  DATE OF BIRTH:  Aug 29, 1955  SUBJECTIVE:    patient here due to her recurrent malignant pleural effusion status post pleurodesis and chest tube placement. Afebrile overnight.  Much more awake and pain near CT tube site improved.   REVIEW OF SYSTEMS:    Review of Systems  Constitutional: Negative for fever and chills.  HENT: Negative for congestion and tinnitus.   Eyes: Negative for blurred vision and double vision.  Respiratory: Negative for cough, shortness of breath and wheezing.   Cardiovascular: Positive for chest pain (Near the chest tube site). Negative for orthopnea and PND.  Gastrointestinal: Negative for nausea, vomiting, abdominal pain and diarrhea.  Genitourinary: Negative for dysuria and hematuria.  Neurological: Negative for dizziness, sensory change and focal weakness.  All other systems reviewed and are negative.   Nutrition: Regular Tolerating Diet: Yes Tolerating PT: Await evaluation   DRUG ALLERGIES:   Allergies  Allergen Reactions  . Ampicillin Hives and Other (See Comments)    Has patient had a PCN reaction causing immediate rash, facial/tongue/throat swelling, SOB or lightheadedness with hypotension: No Has patient had a PCN reaction causing severe rash involving mucus membranes or skin necrosis: No Has patient had a PCN reaction that required hospitalization No Has patient had a PCN reaction occurring within the last 10 years: Yes If all of the above answers are "NO", then may proceed with Cephalosporin use.  Marland Kitchen Penicillins Hives and Other (See Comments)    Has patient had a PCN reaction causing immediate rash, facial/tongue/throat swelling, SOB or lightheadedness with hypotension: No Has patient had a PCN reaction causing severe rash involving mucus membranes or skin necrosis: No Has patient had a PCN reaction that required hospitalization No Has  patient had a PCN reaction occurring within the last 10 years: Yes If all of the above answers are "NO", then may proceed with Cephalosporin use.    VITALS:  Blood pressure 111/60, pulse 103, temperature 99.4 F (37.4 C), temperature source Oral, resp. rate 18, height '5\' 3"'$  (1.6 m), weight 77.202 kg (170 lb 3.2 oz), SpO2 97 %.  PHYSICAL EXAMINATION:   Physical Exam  GENERAL:  60 y.o.-year-old patient lying in the bed awake and tearful at times.    EYES: Pupils pinpoint but reactive, round, reactive to light and accommodation. No scleral icterus. Extraocular muscles intact.  HEENT: Head atraumatic, normocephalic. Oropharynx and nasopharynx clear.  NECK:  Supple, no jugular venous distention. No thyroid enlargement, no tenderness.  LUNGS: Normal breath sounds bilaterally, no wheezing,  Rhonchi, rales at left lower base. No use of accessory muscles of respiration. Positive chest tube to the left side. CARDIOVASCULAR: S1, S2 normal. No murmurs, rubs, or gallops.  ABDOMEN: Soft, nontender, nondistended. Bowel sounds present. No organomegaly or mass.  EXTREMITIES: No cyanosis, clubbing or edema b/l.    NEUROLOGIC: Cranial nerves II through XII are intact. No focal Motor or sensory deficits b/l.   PSYCHIATRIC: The patient is alert and oriented x 3.   SKIN: No obvious rash, lesion, or ulcer.    LABORATORY PANEL:   CBC  Recent Labs Lab 07/16/15 0430  WBC 13.3*  HGB 11.6*  HCT 35.7  PLT 281   ------------------------------------------------------------------------------------------------------------------  Chemistries   Recent Labs Lab 07/14/15 1822 07/15/15 0532  NA 137  --   K 4.8  --   CL 106  --   CO2 26  --  GLUCOSE 161*  --   BUN 11  --   CREATININE 0.90 0.76  CALCIUM 8.7*  --    ------------------------------------------------------------------------------------------------------------------  Cardiac Enzymes No results for input(s): TROPONINI in the last 168  hours. ------------------------------------------------------------------------------------------------------------------  RADIOLOGY:  Dg Chest 2 View  07/16/2015  CLINICAL DATA:  Left lung adenocarcinoma with malignant left pleural effusion. Status post chest tube placement and pleurodesis. Shortness of breath. EXAM: CHEST  2 VIEW COMPARISON:  07/14/2015 FINDINGS: Small bore left chest tube and left-sided Port-A-Cath remain in place. A small left pleural effusion is stable. No pneumothorax visualized. Left lower lobe consolidation or collapse is unchanged. Right lung is clear. Heart size is stable. IMPRESSION: Stable small left pleural effusion and left lower lobe consolidation or collapse. No pneumothorax visualized. Electronically Signed   By: Earle Gell M.D.   On: 07/16/2015 11:15     ASSESSMENT AND PLAN:   60 year old female with past medical history of hypertension, hyperlipidemia, diabetes, COPD, respectively sleep apnea, migraines, chronic pain who presents to the hospital shortness of breath and left-sided pleuritic chest pain.  #1. Malignant pleural effusion due to adenocarcinoma - TTF-1 positive, suggestive of lung origin, appreciate oncologist input and plan for chemotherapy in the next 2-3 weeks - CT scan of the chest and abdomen revealed nodular pleural thickening,  left lower lobe mass, patient underwent left thoracentesis 47/82/9562,  complicated by pneumothorax -Seen by Dr. Genevive Bi and status post left-sided chest tube placement and pleurodesis. As per Dr. Genevive Bi plan to remove chest tube tomorrow.  #2 Fever - pt. Had a fever of 101 yesterday. Currently afebrile. Source unclear.  - CXR showing possible pneumonia and cont. Levaquin.  UA negative, blood cultures negative so far. - follow fever curve.   #3 . Anxiety - cont. Xanax, Remeron.   #4. Essential hypertension - well controlled.  - cont. Enalapril, Metoprolol.   #5. Diabetes mellitus - BS stable.  - cont. SSI.  Carb  control diet.    #6. COPD , no obvious exacerbation, off steroids, continue albuterol and and Pulmicort inhalation therapy - cont. O2 as tolerated.   #7. LLL mass - likely malignant given recurrent pleural effusion which is malignant. - appreciate Oncology consult plan for treatment when resp. Issues improved.   #8. Acute on chronic respiratory failure with hypoxia-due to recurrent pleural effusion, pneumothorax status post ultrasound-guided thoracentesis and now status post chest tube placement with pleurodesis. -Continue oxygen support as wean as tolerated.   Await PT eval and possible d/c tomorrow.    All the records are reviewed and case discussed with Care Management/Social Workerr. Management plans discussed with the patient, family and they are in agreement.  CODE STATUS: Full  DVT Prophylaxis: Lovenox  TOTAL TIME TAKING CARE OF THIS PATIENT: 30 minutes.   POSSIBLE D/C IN 1-2 DAYS, DEPENDING ON CLINICAL CONDITION.   Henreitta Leber M.D on 07/17/2015 at 1:21 PM  Between 7am to 6pm - Pager - [phone]  After 6pm go to [email or web address] - password [email or web address] [email or web address]  [facility]  Office  [phone]  CC: Primary care physician; Kasandra Knudsen, NP

## 2015-07-18 ENCOUNTER — Encounter: Payer: Self-pay | Admitting: *Deleted

## 2015-07-18 ENCOUNTER — Inpatient Hospital Stay: Payer: Medicare Other

## 2015-07-18 ENCOUNTER — Encounter
Admission: AD | Disposition: A | Discharge status: Home Health | Payer: Self-pay | Source: Ambulatory Visit | Attending: Specialist

## 2015-07-18 ENCOUNTER — Inpatient Hospital Stay: Payer: Medicare Other | Admitting: Anesthesiology

## 2015-07-18 HISTORY — PX: REMOVAL OF PLEURAL DRAINAGE CATHETER: SHX5080

## 2015-07-18 LAB — GLUCOSE, CAPILLARY
GLUCOSE-CAPILLARY: 87 mg/dL (ref 65–99)
GLUCOSE-CAPILLARY: 89 mg/dL (ref 65–99)
GLUCOSE-CAPILLARY: 103 mg/dL — AB (ref 65–99)
Glucose-Capillary: 126 mg/dL — ABNORMAL HIGH (ref 65–99)
Glucose-Capillary: 89 mg/dL (ref 65–99)

## 2015-07-18 LAB — BASIC METABOLIC PANEL
ANION GAP: 6 (ref 5–15)
BUN: 8 mg/dL (ref 6–20)
CHLORIDE: 100 mmol/L — AB (ref 101–111)
CO2: 31 mmol/L (ref 22–32)
Calcium: 9 mg/dL (ref 8.9–10.3)
Creatinine, Ser: 0.82 mg/dL (ref 0.44–1.00)
Glucose, Bld: 128 mg/dL — ABNORMAL HIGH (ref 65–99)
POTASSIUM: 4.2 mmol/L (ref 3.5–5.1)
SODIUM: 137 mmol/L (ref 135–145)

## 2015-07-18 LAB — CBC
HCT: 32.8 % — ABNORMAL LOW (ref 35.0–47.0)
HEMOGLOBIN: 10.9 g/dL — AB (ref 12.0–16.0)
MCH: 28.1 pg (ref 26.0–34.0)
MCHC: 33.3 g/dL (ref 32.0–36.0)
MCV: 84.5 fL (ref 80.0–100.0)
PLATELETS: 306 10*3/uL (ref 150–440)
RBC: 3.88 MIL/uL (ref 3.80–5.20)
RDW: 14 % (ref 11.5–14.5)
WBC: 6.3 10*3/uL (ref 3.6–11.0)

## 2015-07-18 SURGERY — REMOVAL, CLOSED DRAINAGE CATHETER SYSTEM, PLEURAL
Anesthesia: Monitor Anesthesia Care | Wound class: Clean

## 2015-07-18 MED ORDER — ONDANSETRON HCL 4 MG/2ML IJ SOLN: 4.0000 mg | Freq: Once | INTRAMUSCULAR | Status: DC | PRN

## 2015-07-18 MED ORDER — FENTANYL CITRATE (PF) 100 MCG/2ML IJ SOLN
INTRAMUSCULAR | Status: DC | PRN
  Administered 2015-07-18 (×2): 25 ug via INTRAVENOUS
  Administered 2015-07-18: 50 ug via INTRAVENOUS

## 2015-07-18 MED ORDER — HYDROMORPHONE HCL 1 MG/ML IJ SOLN
1.0000 mg | Freq: Once | INTRAMUSCULAR | Status: AC
Start: 2015-07-18 — End: 2015-07-18
  Administered 2015-07-18: 08:00:00 1 mg via INTRAVENOUS
  Filled 2015-07-18: qty 1

## 2015-07-18 MED ORDER — BUPIVACAINE HCL 0.25 % IJ SOLN
INTRAMUSCULAR | Status: DC | PRN
Start: 2015-07-18 — End: 2015-07-18
  Administered 2015-07-18: 5 mL

## 2015-07-18 MED ORDER — LACTATED RINGERS IV SOLN
INTRAVENOUS | Status: DC | PRN
  Administered 2015-07-18: 14:00:00 via INTRAVENOUS

## 2015-07-18 MED ORDER — MIDAZOLAM HCL 2 MG/2ML IJ SOLN
INTRAMUSCULAR | Status: DC | PRN
  Administered 2015-07-18: 2 mg via INTRAVENOUS

## 2015-07-18 MED ORDER — BUPIVACAINE HCL (PF) 0.25 % IJ SOLN
INTRAMUSCULAR | Status: AC
  Filled 2015-07-18: qty 30

## 2015-07-18 MED ORDER — FENTANYL CITRATE (PF) 100 MCG/2ML IJ SOLN: 25.0000 ug | INTRAMUSCULAR | Status: DC | PRN

## 2015-07-18 MED ORDER — PROPOFOL 10 MG/ML IV BOLUS
INTRAVENOUS | Status: DC | PRN
  Administered 2015-07-18 (×3): 20 mg via INTRAVENOUS

## 2015-07-18 SURGICAL SUPPLY — 35 items
CANISTER SUCT 1200ML W/VALVE (MISCELLANEOUS) ×2 IMPLANT
CHLORAPREP W/TINT 26ML (MISCELLANEOUS) ×2 IMPLANT
DRAIN CHEST DRY SUCT SGL (MISCELLANEOUS) ×2 IMPLANT
DRAPE INCISE IOBAN 66X45 STRL (DRAPES) ×2 IMPLANT
DRAPE LAPAROTOMY 77X122 PED (DRAPES) ×2 IMPLANT
DRSG TEGADERM 2-3/8X2-3/4 SM (GAUZE/BANDAGES/DRESSINGS) IMPLANT
DRSG TEGADERM 4X4.75 (GAUZE/BANDAGES/DRESSINGS) IMPLANT
DRSG TEGADERM 6X8 (GAUZE/BANDAGES/DRESSINGS) ×2 IMPLANT
ELECT REM PT RETURN 9FT ADLT (ELECTROSURGICAL) ×2
ELECTRODE REM PT RTRN 9FT ADLT (ELECTROSURGICAL) ×1 IMPLANT
GAUZE PETROLATUM 1 X8 (GAUZE/BANDAGES/DRESSINGS) ×2 IMPLANT
GAUZE SPONGE 4X4 12PLY STRL (GAUZE/BANDAGES/DRESSINGS) ×2 IMPLANT
GLOVE EXAM LX STRL 7.5 (GLOVE) ×2 IMPLANT
GOWN STRL REUS W/ TWL LRG LVL3 (GOWN DISPOSABLE) ×2 IMPLANT
GOWN STRL REUS W/TWL LRG LVL3 (GOWN DISPOSABLE) ×2
KIT PLEURX DRAIN CATH 500ML (KITS) ×2 IMPLANT
KIT RM TURNOVER STRD PROC AR (KITS) ×2 IMPLANT
LABEL OR SOLS (LABEL) ×2 IMPLANT
MARKER SKIN DUAL TIP RULER LAB (MISCELLANEOUS) ×2 IMPLANT
PACK BASIN MINOR ARMC (MISCELLANEOUS) ×2 IMPLANT
SUCTION FRAZIER HANDLE 10FR (MISCELLANEOUS) ×1
SUCTION TUBE FRAZIER 10FR DISP (MISCELLANEOUS) ×1 IMPLANT
SUT ETH BLK MONO 3 0 FS 1 12/B (SUTURE) ×4 IMPLANT
SUT ETHILON 4-0 (SUTURE) ×1
SUT ETHILON 4-0 FS2 18XMFL BLK (SUTURE) ×1
SUT SILK 0 (SUTURE) ×1
SUT SILK 0 30XBRD TIE 6 (SUTURE) ×1 IMPLANT
SUT SILK 1 SH (SUTURE) ×2 IMPLANT
SUT VIC AB 0 SH 27 (SUTURE) ×2 IMPLANT
SUT VIC AB 2-0 SH 27 (SUTURE) ×1
SUT VIC AB 2-0 SH 27XBRD (SUTURE) ×1 IMPLANT
SUT VIC AB 3-0 SH 27 (SUTURE) ×1
SUT VIC AB 3-0 SH 27X BRD (SUTURE) ×1 IMPLANT
SUTURE ETHLN 4-0 FS2 18XMF BLK (SUTURE) ×1 IMPLANT
WATER STERILE IRR 1000ML POUR (IV SOLUTION) ×2 IMPLANT

## 2015-07-18 NOTE — Care Management (Signed)
Spoke with Dr. Genevive Bi. Chest tube pulled this morning. Will be going to the operating room this afternoon for Pleurx removal. Shelbie Ammons RN MSN [facility] [phone]

## 2015-07-18 NOTE — Transfer of Care (Signed)
Immediate Anesthesia Transfer of Care Note  Patient: Charlene Williams  Procedure(s) Performed: Procedure(s): REMOVAL OF PLEURAL DRAINAGE CATHETER (N/A)  Patient Location: PACU  Anesthesia Type:MAC  Level of Consciousness: alert  and oriented  Airway & Oxygen Therapy: Patient Spontanous Breathing and Patient connected to nasal cannula oxygen  Post-op Assessment: Report given to RN and Post -op Vital signs reviewed and stable  Post vital signs: Reviewed and stable  Last Vitals:  Filed Vitals:   07/18/15 0931 07/18/15 1241  BP: 116/69 101/59  Pulse: 53 69  Temp:  37.7 C  Resp: 18 18    Complications: No apparent anesthesia complications

## 2015-07-18 NOTE — Anesthesia Postprocedure Evaluation (Signed)
Anesthesia Post Note  Patient: Charlene Williams  Procedure(s) Performed: Procedure(s) (LRB): REMOVAL OF PLEURAL DRAINAGE CATHETER (N/A)  Patient location during evaluation: PACU Anesthesia Type: General and MAC Level of consciousness: awake and alert Pain management: pain level controlled Vital Signs Assessment: post-procedure vital signs reviewed and stable Respiratory status: spontaneous breathing, nonlabored ventilation, respiratory function stable and patient connected to nasal cannula oxygen Cardiovascular status: blood pressure returned to baseline and stable Postop Assessment: no signs of nausea or vomiting Anesthetic complications: no    Last Vitals:  Filed Vitals:   07/18/15 1439 07/18/15 1451  BP: 114/97 127/82  Pulse: 103 103  Temp:  36.9 C  Resp: 16 22    Last Pain:  Filed Vitals:   07/18/15 1454  PainSc: 9                  Martha Clan

## 2015-07-18 NOTE — Anesthesia Preprocedure Evaluation (Addendum)
Anesthesia Evaluation  Patient identified by MRN, date of birth, ID band Patient awake    Reviewed: Allergy & Precautions, NPO status , Patient's Chart, lab work & pertinent test results, reviewed documented beta blocker date and time   Airway Mallampati: II  TM Distance: >3 FB     Dental  (+) Upper Dentures   Pulmonary sleep apnea , pneumonia, COPD,  COPD inhaler, Current Smoker,    + rhonchi        Cardiovascular hypertension, Pt. on medications and Pt. on home beta blockers Normal cardiovascular exam     Neuro/Psych  Headaches, PSYCHIATRIC DISORDERS Depression Lumbar radiculopathy  Neuromuscular disease    GI/Hepatic hiatal hernia, (+)     substance abuse  , Polysubstance abuse   Endo/Other  diabetes, Well Controlled, Type 2, Oral Hypoglycemic Agents  Renal/GU      Musculoskeletal  (+) Arthritis , Osteoarthritis,    Abdominal Normal abdominal exam  (+)   Peds negative pediatric ROS (+)  Hematology   Anesthesia Other Findings   Reproductive/Obstetrics                           Anesthesia Physical Anesthesia Plan  ASA: IV  Anesthesia Plan: MAC and General   Post-op Pain Management:    Induction: Intravenous  Airway Management Planned: Nasal Cannula  Additional Equipment:   Intra-op Plan:   Post-operative Plan:   Informed Consent: I have reviewed the patients History and Physical, chart, labs and discussed the procedure including the risks, benefits and alternatives for the proposed anesthesia with the patient or authorized representative who has indicated his/her understanding and acceptance.   Dental advisory given  Plan Discussed with: CRNA and Surgeon  Anesthesia Plan Comments:         Anesthesia Quick Evaluation                                  Anesthesia Evaluation  Patient identified by MRN, date of birth, ID band Patient awake    Reviewed: Allergy &  Precautions, NPO status , Patient's Chart, lab work & pertinent test results  Airway Mallampati: I  TM Distance: >3 FB Neck ROM: Full    Dental  (+) Upper Dentures   Pulmonary sleep apnea and Continuous Positive Airway Pressure Ventilation , pneumonia, unresolved, COPD,  COPD inhaler and oxygen dependent, Current Smoker,   No breath sounds on the left.   + decreased breath sounds      Cardiovascular Exercise Tolerance: Poor hypertension, Pt. on medications and Pt. on home beta blockers Normal cardiovascular exam Rhythm:Regular Rate:Normal     Neuro/Psych    GI/Hepatic hiatal hernia,   Endo/Other  diabetes, Type 2BG 125.  Renal/GU      Musculoskeletal  (+) Arthritis , Osteoarthritis,    Abdominal (+)  Abdomen: soft.    Peds  Hematology Hb 12.8.   Anesthesia Other Findings   Reproductive/Obstetrics                            Anesthesia Physical Anesthesia Plan  ASA: IV  Anesthesia Plan: General   Post-op Pain Management:    Induction: Intravenous  Airway Management Planned: Double Lumen EBT  Additional Equipment:   Intra-op Plan:   Post-operative Plan: Extubation in OR  Informed Consent: I have reviewed the patients History and Physical, chart, labs and  discussed the procedure including the risks, benefits and alternatives for the proposed anesthesia with the patient or authorized representative who has indicated his/her understanding and acceptance.     Plan Discussed with:                                   Anesthesia Evaluation  Patient identified by MRN, date of birth, ID band Patient awake    Reviewed: Allergy & Precautions, NPO status , Patient's Chart, lab work & pertinent test results  Airway Mallampati: I  TM Distance: >3 FB Neck ROM: Full    Dental  (+) Upper Dentures   Pulmonary sleep apnea and Continuous Positive Airway Pressure Ventilation , pneumonia, unresolved, COPD,  COPD inhaler  and oxygen dependent, Current Smoker,   No breath sounds on the left.   + decreased breath sounds      Cardiovascular Exercise Tolerance: Poor hypertension, Pt. on medications and Pt. on home beta blockers Normal cardiovascular exam Rhythm:Regular Rate:Normal     Neuro/Psych    GI/Hepatic hiatal hernia,   Endo/Other  diabetes, Type 2BG 125.  Renal/GU      Musculoskeletal  (+) Arthritis , Osteoarthritis,    Abdominal (+)  Abdomen: soft.    Peds  Hematology Hb 12.8.   Anesthesia Other Findings   Reproductive/Obstetrics                            Anesthesia Physical Anesthesia Plan  ASA: IV  Anesthesia Plan: General   Post-op Pain Management:    Induction: Intravenous  Airway Management Planned: Double Lumen EBT  Additional Equipment:   Intra-op Plan:   Post-operative Plan: Extubation in OR  Informed Consent: I have reviewed the patients History and Physical, chart, labs and discussed the procedure including the risks, benefits and alternatives for the proposed anesthesia with the patient or authorized representative who has indicated his/her understanding and acceptance.     Plan Discussed with: CRNA and Surgeon  Anesthesia Plan Comments: (Will likely need a normal ETT and a bronchial blocker. We may not be able to pass even a 35 DLT.)       Anesthesia Quick Evaluation CRNA and Surgeon  Anesthesia Plan Comments: (Will likely need a normal ETT and a bronchial blocker. We may not be able to pass even a 35 DLT.)       Anesthesia Quick Evaluation

## 2015-07-18 NOTE — Progress Notes (Signed)
[facility] NAME: Charlene Williams    MR#:  [id number]  DATE OF BIRTH:  12/28/1955  SUBJECTIVE:    patient's chest tube was removed today. Still having some pain near the chest tube site. Going to the OR later today for Pleurx catheter removal. No nausea, vomiting, fever, chills.  REVIEW OF SYSTEMS:    Review of Systems  Constitutional: Negative for fever and chills.  HENT: Negative for congestion and tinnitus.   Eyes: Negative for blurred vision and double vision.  Respiratory: Negative for cough, shortness of breath and wheezing.   Cardiovascular: Positive for chest pain (Near the chest tube site). Negative for orthopnea and PND.  Gastrointestinal: Negative for nausea, vomiting, abdominal pain and diarrhea.  Genitourinary: Negative for dysuria and hematuria.  Neurological: Negative for dizziness, sensory change and focal weakness.  All other systems reviewed and are negative.   Nutrition: Regular Tolerating Diet: Yes Tolerating PT: Await evaluation   DRUG ALLERGIES:   Allergies  Allergen Reactions  . Ampicillin Hives and Other (See Comments)    Has patient had a PCN reaction causing immediate rash, facial/tongue/throat swelling, SOB or lightheadedness with hypotension: No Has patient had a PCN reaction causing severe rash involving mucus membranes or skin necrosis: No Has patient had a PCN reaction that required hospitalization No Has patient had a PCN reaction occurring within the last 10 years: Yes If all of the above answers are "NO", then may proceed with Cephalosporin use.  Marland Kitchen Penicillins Hives and Other (See Comments)    Has patient had a PCN reaction causing immediate rash, facial/tongue/throat swelling, SOB or lightheadedness with hypotension: No Has patient had a PCN reaction causing severe rash involving mucus membranes or skin necrosis: No Has patient had a PCN reaction that required hospitalization No Has  patient had a PCN reaction occurring within the last 10 years: Yes If all of the above answers are "NO", then may proceed with Cephalosporin use.    VITALS:  Blood pressure 102/67, pulse 107, temperature 98 F (36.7 C), temperature source Tympanic, resp. rate 19, height '5\' 3"'$  (1.6 m), weight 78.835 kg (173 lb 12.8 oz), SpO2 92 %.  PHYSICAL EXAMINATION:   Physical Exam  GENERAL:  60 y.o.-year-old patient lying in the bed in no apparent distress.    EYES: Pupils pinpoint but reactive, round, reactive to light and accommodation. No scleral icterus. Extraocular muscles intact.  HEENT: Head atraumatic, normocephalic. Oropharynx and nasopharynx clear.  NECK:  Supple, no jugular venous distention. No thyroid enlargement, no tenderness.  LUNGS: Normal breath sounds bilaterally, no wheezing,  Rhonchi, rales at left lower base. No use of accessory muscles of respiration. Positive Pleurx catheter in place. CARDIOVASCULAR: S1, S2 normal. No murmurs, rubs, or gallops.  ABDOMEN: Soft, nontender, nondistended. Bowel sounds present. No organomegaly or mass.  EXTREMITIES: No cyanosis, clubbing or edema b/l.    NEUROLOGIC: Cranial nerves II through XII are intact. No focal Motor or sensory deficits b/l.   PSYCHIATRIC: The patient is alert and oriented x 3.   SKIN: No obvious rash, lesion, or ulcer.    LABORATORY PANEL:   CBC  Recent Labs Lab 07/18/15 0503  WBC 6.3  HGB 10.9*  HCT 32.8*  PLT 306   ------------------------------------------------------------------------------------------------------------------  Chemistries   Recent Labs Lab 07/18/15 0503  NA 137  K 4.2  CL 100*  CO2 31  GLUCOSE 128*  BUN 8  CREATININE 0.82  CALCIUM 9.0   ------------------------------------------------------------------------------------------------------------------  Cardiac Enzymes No results for input(s): TROPONINI in the last 168  hours. ------------------------------------------------------------------------------------------------------------------  RADIOLOGY:  Dg Chest 2 View  07/18/2015  CLINICAL DATA:  Malignant left-sided pleural effusion recurrent- cytology positive for adenocarcinoma; TTF-1 positive; Suggestive of lung origin. Patient status post pleurodesis/chest tube placement for pneumothorax, may have catherer removed today EXAM: CHEST - 2 VIEW COMPARISON:  07/18/2015 FINDINGS: Left subclavian port catheter to the proximal SVC. Left PleurX catheter stable in position. Little change in loculated lateral left pleural effusion /thickening. Left infrahilar masslike consolidation stable. Consolidation/ atelectasis at the left lung base is largely stable. Right lung clear. Heart size upper limits normal. No pneumothorax.  Atheromatous aorta. Spondylitic changes in the lower thoracic spine as before. IMPRESSION: 1. Left PleurX catheter with stable left pleural effusions/thickening and lower lung opacities as above. Electronically Signed   By: Lucrezia Europe M.D.   On: 07/18/2015 10:30   Dg Chest 2 View  07/18/2015  CLINICAL DATA:  Postop check.  Chest tube. EXAM: CHEST  2 VIEW COMPARISON:  07/16/2015 FINDINGS: Left subclavian Port-A-Cath remains with tip overlying the mid SVC. Left chest tube remains in place. Cardiomediastinal silhouette is unchanged. Small left pleural effusion is unchanged, as is left basilar airspace opacity. No definite pneumothorax is identified. Right lung remains clear. IMPRESSION: Small left pleural effusion and left basilar consolidation, unchanged. Electronically Signed   By: Logan Bores M.D.   On: 07/18/2015 07:35     ASSESSMENT AND PLAN:   60 year old female with past medical history of hypertension, hyperlipidemia, diabetes, COPD, respectively sleep apnea, migraines, chronic pain who presents to the hospital shortness of breath and left-sided pleuritic chest pain.  #1. 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 Patient going to the OR today to get the Pleurx catheter removed.  #2 Fever - pt. Had a fever of 101 2 days ago. Currently afebrile. Source unclear.  - CXR showing possible pneumonia and cont. Levaquin.  UA negative, blood cultures negative so far.   #3 . Anxiety - cont. Xanax, Remeron.   #4. Essential hypertension - well controlled.  - cont. Enalapril, Metoprolol.   #5. Diabetes mellitus - BS stable.  - cont. SSI.  Carb control diet.    #6. COPD , no obvious exacerbation, off steroids, continue albuterol and and Pulmicort inhalation therapy - cont. O2 as tolerated.   #7. LLL mass -  malignant given recurrent pleural effusion which is malignant. - appreciate Oncology consult plan for treatment as outpatient in the next coming weeks.   #8. Acute on chronic respiratory failure with hypoxia-due to recurrent pleural effusion, pneumothorax and also Lung mass suspicious for cancer. -Weaned off oxygen. Chest tube removed today.  Await PT eval and d/c home tomorrow.    All the records are reviewed and case discussed with Care Management/Social Workerr. Management plans discussed with the patient, family and they are in agreement.  CODE STATUS: Full  DVT Prophylaxis: Lovenox  TOTAL TIME TAKING CARE OF THIS PATIENT: 30 minutes.   POSSIBLE D/C IN 1-2 DAYS, DEPENDING ON CLINICAL CONDITION.   Henreitta Leber M.D on 07/18/2015 at 2:34 PM  Between 7am to 6pm - Pager - [phone]  After 6pm go to  [email or web address] - password [email or web address] [facility]  [facility]  Office   [phone]  CC: Primary care physician; Kasandra Knudsen, NP

## 2015-07-18 NOTE — Progress Notes (Signed)
She drained very little from her chest tube yesterday.  System is patient and chest xray from this morning shows no pneumothorax or pleural effusion.  I removed the chest tube without incident and explained to her the indications for the PleurX catheter.  I told her that the catheter could be removed in the next few weeks if she does not need it.  However she is very upset about caring for the catheter and was very tearful about managing this at home.  She really can not deal with this catheter on a physical or emotional level and therefore, it is possible to remove this.  I told her that we would try to do this this afternoon if at all possible.

## 2015-07-18 NOTE — Progress Notes (Signed)
Consent signed by patient for PleurX catheter removal - to be performed by Dr. Genevive Bi. Madlyn Frankel, RN

## 2015-07-18 NOTE — Op Note (Signed)
  07/11/2015 - 07/18/2015  2:26 PM  PATIENT:  Charlene Williams  60 y.o. female  PRE-OPERATIVE DIAGNOSIS:  PleurX catheter  POST-OPERATIVE DIAGNOSIS:  same  PROCEDURE:  Removal of PleurX catheter  SURGEON:  Surgeon(s) and Role:    * Nestor Lewandowsky, MD - Primary  ASSISTANTS: Amy Yu PAS  ANESTHESIA: MAC  INDICATIONS FOR PROCEDURE Inability to manage PleurX catheter  DICTATION: The patient was brought to the OR and placed in the lateral position with the left chest upwards.  The patient was prepped and draped in the usual sterile fashion.  The skin entrance site was anesthetized with Marcaine.  The catheter was rapidly pulled without incident.  The catheter was intact.  The entrance site was covered with vaseline gauze and 4 X 4s and Opsite. The patient was taken to recovery in stable condition.   Nestor Lewandowsky, MD

## 2015-07-19 ENCOUNTER — Other Ambulatory Visit: Payer: Self-pay | Admitting: *Deleted

## 2015-07-19 ENCOUNTER — Other Ambulatory Visit: Payer: Self-pay | Admitting: Internal Medicine

## 2015-07-19 DIAGNOSIS — C3492 Malignant neoplasm of unspecified part of left bronchus or lung: Secondary | ICD-10-CM

## 2015-07-19 DIAGNOSIS — C3432 Malignant neoplasm of lower lobe, left bronchus or lung: Secondary | ICD-10-CM

## 2015-07-19 LAB — GLUCOSE, CAPILLARY: Glucose-Capillary: 144 mg/dL — ABNORMAL HIGH (ref 65–99)

## 2015-07-19 MED ORDER — LIDOCAINE-PRILOCAINE 2.5-2.5 % EX CREA: 1.0000 "application " | TOPICAL_CREAM | CUTANEOUS | Status: DC | PRN

## 2015-07-19 MED ORDER — HYDROMORPHONE HCL 2 MG PO TABS: 1.0000 mg | ORAL_TABLET | ORAL | Status: DC | PRN

## 2015-07-19 MED ORDER — FOLIC ACID 1 MG PO TABS: 1.0000 mg | ORAL_TABLET | Freq: Every day | ORAL | Status: DC

## 2015-07-19 MED ORDER — BISACODYL 10 MG RE SUPP
10.0000 mg | Freq: Once | RECTAL | Status: AC
  Administered 2015-07-19: 10:00:00 10 mg via RECTAL
  Filled 2015-07-19: qty 1

## 2015-07-19 MED ORDER — PROCHLORPERAZINE MALEATE 10 MG PO TABS: 10.0000 mg | ORAL_TABLET | Freq: Four times a day (QID) | ORAL | Status: DC | PRN

## 2015-07-19 MED ORDER — LEVOFLOXACIN 750 MG PO TABS: 750.0000 mg | ORAL_TABLET | ORAL | Status: DC

## 2015-07-19 MED ORDER — ONDANSETRON HCL 8 MG PO TABS: 8.0000 mg | ORAL_TABLET | Freq: Three times a day (TID) | ORAL | Status: DC | PRN

## 2015-07-19 MED ORDER — LEVOFLOXACIN 750 MG PO TABS
750.0000 mg | ORAL_TABLET | Freq: Every day | ORAL | Status: DC
  Administered 2015-07-19: 09:00:00 750 mg via ORAL
  Filled 2015-07-19: qty 1

## 2015-07-19 NOTE — Care Management (Signed)
Discharge to home today per Dr. Heron Sabins. Will be followed by [facility] in the home. Referral faxed to [facility]. [facility] will be supplying home oxygen. Mandy at [facility] updated. Family will transport. Shelbie Ammons RN MSN CCM Care Management [phone]

## 2015-07-19 NOTE — Patient Instructions (Signed)
Bevacizumab injection What is this medicine? BEVACIZUMAB (be va SIZ yoo mab) is a monoclonal antibody. It is used to treat cervical cancer, colorectal cancer, glioblastoma multiforme, non-small cell lung cancer (NSCLC), ovarian cancer, and renal cell cancer. This medicine may be used for other purposes; ask your health care provider or pharmacist if you have questions. What should I tell my health care provider before I take this medicine? They need to know if you have any of these conditions: -blood clots -heart disease, including heart failure, heart attack, or chest pain (angina) -high blood pressure -infection (especially a virus infection such as chickenpox, cold sores, or herpes) -kidney disease -lung disease -prior chemotherapy with doxorubicin, daunorubicin, epirubicin, or other anthracycline type chemotherapy agents -recent or ongoing radiation therapy -recent surgery -stroke -an unusual or allergic reaction to bevacizumab, hamster proteins, mouse proteins, other medicines, foods, dyes, or preservatives -pregnant or trying to get pregnant -breast-feeding How should I use this medicine? This medicine is for infusion into a vein. It is given by a health care professional in a hospital or clinic setting. Talk to your pediatrician regarding the use of this medicine in children. Special care may be needed. Overdosage: If you think you have taken too much of this medicine contact a poison control center or emergency room at once. NOTE: This medicine is only for you. Do not share this medicine with others. What if I miss a dose? It is important not to miss your dose. Call your doctor or health care professional if you are unable to keep an appointment. What may interact with this medicine? Interactions are not expected. This list may not describe all possible interactions. Give your health care provider a list of all the medicines, herbs, non-prescription drugs, or dietary supplements  you use. Also tell them if you smoke, drink alcohol, or use illegal drugs. Some items may interact with your medicine. What should I watch for while using this medicine? Your condition will be monitored carefully while you are receiving this medicine. You will need important blood work and urine testing done while you are taking this medicine. During your treatment, let your health care professional know if you have any unusual symptoms, such as difficulty breathing. This medicine may rarely cause 'gastrointestinal perforation' (holes in the stomach, intestines or colon), a serious side effect requiring surgery to repair. This medicine should be started at least 28 days following major surgery and the site of the surgery should be totally healed. Check with your doctor before scheduling dental work or surgery while you are receiving this treatment. Talk to your doctor if you have recently had surgery or if you have a wound that has not healed. Do not become pregnant while taking this medicine or for 6 months after stopping it. Women should inform their doctor if they wish to become pregnant or think they might be pregnant. There is a potential for serious side effects to an unborn child. Talk to your health care professional or pharmacist for more information. Do not breast-feed an infant while taking this medicine. This medicine has caused ovarian failure in some women. This medicine may interfere with the ability to have a child. You should talk to your doctor or health care professional if you are concerned about your fertility. What side effects may I notice from receiving this medicine? Side effects that you should report to your doctor or health care professional as soon as possible: -allergic reactions like skin rash, itching or hives, swelling of the face,  lips, or tongue -signs of infection - fever or chills, cough, sore throat, pain or trouble passing urine -signs of decreased platelets or  bleeding - bruising, pinpoint red spots on the skin, black, tarry stools, nosebleeds, blood in the urine -breathing problems -changes in vision -chest pain -confusion -jaw pain, especially after dental work -mouth sores -seizures -severe abdominal pain -severe headache -sudden numbness or weakness of the face, arm or leg -swelling of legs or ankles -symptoms of a stroke: change in mental awareness, inability to talk or move one side of the body (especially in patients with lung cancer) -trouble passing urine or change in the amount of urine -trouble speaking or understanding -trouble walking, dizziness, loss of balance or coordination Side effects that usually do not require medical attention (report to your doctor or health care professional if they continue or are bothersome): -constipation -diarrhea -dry skin -headache -loss of appetite -nausea, vomiting This list may not describe all possible side effects. Call your doctor for medical advice about side effects. You may report side effects to FDA at 1-800-FDA-1088. Where should I keep my medicine? This drug is given in a hospital or clinic and will not be stored at home. NOTE: This sheet is a summary. It may not cover all possible information. If you have questions about this medicine, talk to your doctor, pharmacist, or health care provider.    2016, Elsevier/Gold Standard. (2014-05-10 16:58:44) Pemetrexed injection What is this medicine? PEMETREXED (PEM e TREX ed) is a chemotherapy drug. This medicine affects cells that are rapidly growing, such as cancer cells and cells in your mouth and stomach. It is usually used to treat lung cancers like non-small cell lung cancer and mesothelioma. It may also be used to treat other cancers. This medicine may be used for other purposes; ask your health care provider or pharmacist if you have questions. What should I tell my health care provider before I take this medicine? They need to  know if you have any of these conditions: -if you frequently drink alcohol containing beverages -infection (especially a virus infection such as chickenpox, cold sores, or herpes) -kidney disease -liver disease -low blood counts, like low platelets, red bloods, or white blood cells -an unusual or allergic reaction to pemetrexed, mannitol, other medicines, foods, dyes, or preservatives -pregnant or trying to get pregnant -breast-feeding How should I use this medicine? This drug is given as an infusion into a vein. It is administered in a hospital or clinic by a specially trained health care professional. Talk to your pediatrician regarding the use of this medicine in children. Special care may be needed. Overdosage: If you think you have taken too much of this medicine contact a poison control center or emergency room at once. NOTE: This medicine is only for you. Do not share this medicine with others. What if I miss a dose? It is important not to miss your dose. Call your doctor or health care professional if you are unable to keep an appointment. What may interact with this medicine? -aspirin and aspirin-like medicines -medicines to increase blood counts like filgrastim, pegfilgrastim, sargramostim -methotrexate -NSAIDS, medicines for pain and inflammation, like ibuprofen or naproxen -probenecid -pyrimethamine -vaccines Talk to your doctor or health care professional before taking any of these medicines: -acetaminophen -aspirin -ibuprofen -ketoprofen -naproxen This list may not describe all possible interactions. Give your health care provider a list of all the medicines, herbs, non-prescription drugs, or dietary supplements you use. Also tell them if you smoke, drink  alcohol, or use illegal drugs. Some items may interact with your medicine. What should I watch for while using this medicine? Visit your doctor for checks on your progress. This drug may make you feel generally unwell.  This is not uncommon, as chemotherapy can affect healthy cells as well as cancer cells. Report any side effects. Continue your course of treatment even though you feel ill unless your doctor tells you to stop. In some cases, you may be given additional medicines to help with side effects. Follow all directions for their use. Call your doctor or health care professional for advice if you get a fever, chills or sore throat, or other symptoms of a cold or flu. Do not treat yourself. This drug decreases your body's ability to fight infections. Try to avoid being around people who are sick. This medicine may increase your risk to bruise or bleed. Call your doctor or health care professional if you notice any unusual bleeding. Be careful brushing and flossing your teeth or using a toothpick because you may get an infection or bleed more easily. If you have any dental work done, tell your dentist you are receiving this medicine. Avoid taking products that contain aspirin, acetaminophen, ibuprofen, naproxen, or ketoprofen unless instructed by your doctor. These medicines may hide a fever. Call your doctor or health care professional if you get diarrhea or mouth sores. Do not treat yourself. To protect your kidneys, drink water or other fluids as directed while you are taking this medicine. Men and women must use effective birth control while taking this medicine. You may also need to continue using effective birth control for a time after stopping this medicine. Do not become pregnant while taking this medicine. Tell your doctor right away if you think that you or your partner might be pregnant. There is a potential for serious side effects to an unborn child. Talk to your health care professional or pharmacist for more information. Do not breast-feed an infant while taking this medicine. This medicine may lower sperm counts. What side effects may I notice from receiving this medicine? Side effects that you should  report to your doctor or health care professional as soon as possible: -allergic reactions like skin rash, itching or hives, swelling of the face, lips, or tongue -low blood counts - this medicine may decrease the number of white blood cells, red blood cells and platelets. You may be at increased risk for infections and bleeding. -signs of infection - fever or chills, cough, sore throat, pain or difficulty passing urine -signs of decreased platelets or bleeding - bruising, pinpoint red spots on the skin, black, tarry stools, blood in the urine -signs of decreased red blood cells - unusually weak or tired, fainting spells, lightheadedness -breathing problems, like a dry cough -changes in emotions or moods -chest pain -confusion -diarrhea -high blood pressure -mouth or throat sores or ulcers -pain, swelling, warmth in the leg -pain on swallowing -swelling of the ankles, feet, hands -trouble passing urine or change in the amount of urine -vomiting -yellowing of the eyes or skin Side effects that usually do not require medical attention (report to your doctor or health care professional if they continue or are bothersome): -hair loss -loss of appetite -nausea -stomach upset This list may not describe all possible side effects. Call your doctor for medical advice about side effects. You may report side effects to FDA at 1-800-FDA-1088. Where should I keep my medicine? This drug is given in a hospital or clinic  and will not be stored at home. NOTE: This sheet is a summary. It may not cover all possible information. If you have questions about this medicine, talk to your doctor, pharmacist, or health care provider.    2016, Elsevier/Gold Standard. (2007-10-13 13:24:03) Carboplatin injection What is this medicine? CARBOPLATIN (KAR boe pla tin) is a chemotherapy drug. It targets fast dividing cells, like cancer cells, and causes these cells to die. This medicine is used to treat ovarian  cancer and many other cancers. This medicine may be used for other purposes; ask your health care provider or pharmacist if you have questions. What should I tell my health care provider before I take this medicine? They need to know if you have any of these conditions: -blood disorders -hearing problems -kidney disease -recent or ongoing radiation therapy -an unusual or allergic reaction to carboplatin, cisplatin, other chemotherapy, other medicines, foods, dyes, or preservatives -pregnant or trying to get pregnant -breast-feeding How should I use this medicine? This drug is usually given as an infusion into a vein. It is administered in a hospital or clinic by a specially trained health care professional. Talk to your pediatrician regarding the use of this medicine in children. Special care may be needed. Overdosage: If you think you have taken too much of this medicine contact a poison control center or emergency room at once. NOTE: This medicine is only for you. Do not share this medicine with others. What if I miss a dose? It is important not to miss a dose. Call your doctor or health care professional if you are unable to keep an appointment. What may interact with this medicine? -medicines for seizures -medicines to increase blood counts like filgrastim, pegfilgrastim, sargramostim -some antibiotics like amikacin, gentamicin, neomycin, streptomycin, tobramycin -vaccines Talk to your doctor or health care professional before taking any of these medicines: -acetaminophen -aspirin -ibuprofen -ketoprofen -naproxen This list may not describe all possible interactions. Give your health care provider a list of all the medicines, herbs, non-prescription drugs, or dietary supplements you use. Also tell them if you smoke, drink alcohol, or use illegal drugs. Some items may interact with your medicine. What should I watch for while using this medicine? Your condition will be monitored  carefully while you are receiving this medicine. You will need important blood work done while you are taking this medicine. This drug may make you feel generally unwell. This is not uncommon, as chemotherapy can affect healthy cells as well as cancer cells. Report any side effects. Continue your course of treatment even though you feel ill unless your doctor tells you to stop. In some cases, you may be given additional medicines to help with side effects. Follow all directions for their use. Call your doctor or health care professional for advice if you get a fever, chills or sore throat, or other symptoms of a cold or flu. Do not treat yourself. This drug decreases your body's ability to fight infections. Try to avoid being around people who are sick. This medicine may increase your risk to bruise or bleed. Call your doctor or health care professional if you notice any unusual bleeding. Be careful brushing and flossing your teeth or using a toothpick because you may get an infection or bleed more easily. If you have any dental work done, tell your dentist you are receiving this medicine. Avoid taking products that contain aspirin, acetaminophen, ibuprofen, naproxen, or ketoprofen unless instructed by your doctor. These medicines may hide a fever. Do not become  pregnant while taking this medicine. Women should inform their doctor if they wish to become pregnant or think they might be pregnant. There is a potential for serious side effects to an unborn child. Talk to your health care professional or pharmacist for more information. Do not breast-feed an infant while taking this medicine. What side effects may I notice from receiving this medicine? Side effects that you should report to your doctor or health care professional as soon as possible: -allergic reactions like skin rash, itching or hives, swelling of the face, lips, or tongue -signs of infection - fever or chills, cough, sore throat, pain or  difficulty passing urine -signs of decreased platelets or bleeding - bruising, pinpoint red spots on the skin, black, tarry stools, nosebleeds -signs of decreased red blood cells - unusually weak or tired, fainting spells, lightheadedness -breathing problems -changes in hearing -changes in vision -chest pain -high blood pressure -low blood counts - This drug may decrease the number of white blood cells, red blood cells and platelets. You may be at increased risk for infections and bleeding. -nausea and vomiting -pain, swelling, redness or irritation at the injection site -pain, tingling, numbness in the hands or feet -problems with balance, talking, walking -trouble passing urine or change in the amount of urine Side effects that usually do not require medical attention (report to your doctor or health care professional if they continue or are bothersome): -hair loss -loss of appetite -metallic taste in the mouth or changes in taste This list may not describe all possible side effects. Call your doctor for medical advice about side effects. You may report side effects to FDA at 1-800-FDA-1088. Where should I keep my medicine? This drug is given in a hospital or clinic and will not be stored at home. NOTE: This sheet is a summary. It may not cover all possible information. If you have questions about this medicine, talk to your doctor, pharmacist, or health care provider.    2016, Elsevier/Gold Standard. (2007-06-16 14:38:05)

## 2015-07-19 NOTE — Progress Notes (Signed)
Spoke with Dr. Rogue Bussing. RX for folic acid sent to patient's pharmacy. Patient will need to take Folic Acid 1 mg starting 5-7 days before Alimta chemotherapy. Continue until 21 days after Alimta is completed. Msg sent to cancer center scheduling to arrange for chemotherapy class tomorrow and b12 injection. Folic [id number] inj. Needed prior to starting Alimita chemotherapy.  We will plan on starting chemotherapy on Monday 07/24/15 (carboplatin/avastin/alimita).

## 2015-07-19 NOTE — Consult Note (Signed)
Pharmacy Antibiotic Note  Charlene Williams is a 60 y.o. female admitted on 07/11/2015 with pneumonia.  Pharmacy has been consulted for levofloxacin dosing. Patient has malignant left sided pleural effusion- adenocarcinoma, s/p chest tube placement. Chest x-ray shows consolidation.  Plan: Patient qualifies for IV to PO transition therefore will change to levofloxacin 750 mg PO q24 hours.   Height: '5\' 3"'$  (160 cm) Weight: 173 lb 12.8 oz (78.835 kg) IBW/kg (Calculated) : 52.4  Temp (24hrs), Avg:98.7 F (37.1 C), Min:97.6 F (36.4 C), Max:99.8 F (37.7 C)   Recent Labs Lab 07/14/15 1822 07/15/15 0532 07/16/15 0430 07/18/15 0503  WBC 11.0  --  13.3* 6.3  CREATININE 0.90 0.76  --  0.82    Estimated Creatinine Clearance: 71.7 mL/min (by C-G formula based on Cr of 0.82).    Allergies  Allergen Reactions  . Ampicillin Hives and Other (See Comments)    Has patient had a PCN reaction causing immediate rash, facial/tongue/throat swelling, SOB or lightheadedness with hypotension: No Has patient had a PCN reaction causing severe rash involving mucus membranes or skin necrosis: No Has patient had a PCN reaction that required hospitalization No Has patient had a PCN reaction occurring within the last 10 years: Yes If all of the above answers are "NO", then may proceed with Cephalosporin use.  Marland Kitchen Penicillins Hives and Other (See Comments)    Has patient had a PCN reaction causing immediate rash, facial/tongue/throat swelling, SOB or lightheadedness with hypotension: No Has patient had a PCN reaction causing severe rash involving mucus membranes or skin necrosis: No Has patient had a PCN reaction that required hospitalization No Has patient had a PCN reaction occurring within the last 10 years: Yes If all of the above answers are "NO", then may proceed with Cephalosporin use.    Antimicrobials this admission: levofloxacin 4/23 >>   Dose adjustments this admission:  Microbiology  results: 4/23 BCx: needs to be collected   Thank you for allowing pharmacy to be a part of this patient's care.  Larene Beach, PharmD  Clinical Pharmacist 07/19/2015 7:36 AM

## 2015-07-19 NOTE — Progress Notes (Signed)
Pt d/ced home w/[facility].  Pt qualified for home O2.  Reviewed scripts w/pt and f/u appts.  Also spoke to pharmacist re: meds.  They had order for compazine but not zofran which was on the d/c meds.  Will f/u w/Dr. B. - He called and sd zofran will be a premed before chemo.  Pt IV removed by student.  Pt was wheeled out and taken home by family member.

## 2015-07-19 NOTE — Progress Notes (Signed)
Nutrition Follow-up  DOCUMENTATION CODES:   Not applicable  INTERVENTION:   -Cater to pt preferences on Regular diet order -Will recommend revisiting supplements with pt if po intake declines to meet nutritional needs   NUTRITION DIAGNOSIS:   Inadequate oral intake related to poor appetite as evidenced by per patient/family report.  GOAL:   Patient will meet greater than or equal to 90% of their needs  MONITOR:   PO intake, Labs, Weight trends, I & O's  REASON FOR ASSESSMENT:   Malnutrition Screening Tool    ASSESSMENT:   Pt admitted with SOB  secondary to Malignant pleural effusion due to adenocarcinoma. Pt with recent admission 2 weeks ago s/p thoracentesis then. Pt with h/o COPD and DM.  Pt s/p chest tube and Pleurx cath placement and removal yesterday. Pt walking the unit with PT this morning.  Diet Order:  Diet regular Room service appropriate?: Yes; Fluid consistency:: Thin  Unable to discuss po intake as pt walking the unit this morning. Pt ate 60% of breakfast tray this morning. Per chart review pt ate 100% of meals on 4/23. RD notes multiple NPO orders for procedures throughout admission.    Gastrointestinal Profile: Last BM: 07/17/2015   Medications: Remeron, Mag-Ox, Colace, Senna, SS novolog  Labs: reviewed  Weight Trend since Admission: Filed Weights   07/17/15 0451 07/18/15 0500 07/19/15 0437  Weight: 170 lb 3.2 oz (77.202 kg) 173 lb 12.8 oz (78.835 kg) 173 lb 12.8 oz (78.835 kg)   RD notes weight increase   Skin:  Reviewed, no issues   BMI:  Body mass index is 30.79 kg/(m^2).  Estimated Nutritional Needs:   Kcal:  1850-2200kcals  Protein:  81-96g protein  Fluid:  >2L fluid  EDUCATION NEEDS:   No education needs identified at this time  Dwyane Luo, RD, LDN Pager [phone] Weekend/On-Call Pager [phone]

## 2015-07-19 NOTE — Progress Notes (Signed)
Charlene Williams   DOB:1955/10/08   BH#:[id number]    Subjective: Patient had pleurodesis/chest tube placement; POD #4. Chest tube/Pleurx catheter taken out yesterday.   Shortness of breath improved. Continues to have chest pain/the site of the chest tube. She received IV Dilaudid yesterday. However she has been able to sleep well at night. She has been walking the hallways.  ROS: No diarrhea. Constipation.  Objective:  Filed Vitals:   07/18/15 2054 07/19/15 0437  BP: 121/76 95/55  Pulse: 114 103  Temp: 97.6 F (36.4 C) 99.7 F (37.6 C)  Resp: 18 18     Intake/Output Summary (Last 24 hours) at 07/19/15 0802 Last data filed at 07/18/15 1511  Gross per 24 hour  Intake    422 ml  Output    800 ml  Net   -378 ml    GENERAL: Well-nourished well-developed; Alert, no distress and comfortableAlone.Sitting in the bed having breakfast. EYES: no pallor or icterus OROPHARYNX: no thrush or ulceration;  NECK: supple, no masses felt LYMPH: no palpable lymphadenopathy in the cervical, axillary or inguinal regions LUNGS: Diminished breath sounds on the left lung ; No wheeze or crackles HEART/CVS: regular rate & rhythm and no murmurs; No lower extremity edema ABDOMEN: abdomen soft, non-tender and normal bowel sounds; distended. No hepatosplenomegaly. Musculoskeletal:no cyanosis of digits and no clubbing; 3x4 cm mass noted in Right arm  PSYCH: alert & oriented x 3 with fluent speech NEURO: no focal motor/sensory deficits SKIN: no rashes or significant lesions.  Labs:  Lab Results  Component Value Date   WBC 6.3 07/18/2015   HGB 10.9* 07/18/2015   HCT 32.8* 07/18/2015   MCV 84.5 07/18/2015   PLT 306 07/18/2015   NEUTROABS 1.8 07/23/2012    Lab Results  Component Value Date   NA 137 07/18/2015   K 4.2 07/18/2015   CL 100* 07/18/2015   CO2 31 07/18/2015    Studies:  Dg Chest 2 View  07/18/2015  CLINICAL DATA:  Malignant left-sided pleural effusion recurrent- cytology positive  for adenocarcinoma; TTF-1 positive; Suggestive of lung origin. Patient status post pleurodesis/chest tube placement for pneumothorax, may have catherer removed today EXAM: CHEST - 2 VIEW COMPARISON:  07/18/2015 FINDINGS: Left subclavian port catheter to the proximal SVC. Left PleurX catheter stable in position. Little change in loculated lateral left pleural effusion /thickening. Left infrahilar masslike consolidation stable. Consolidation/ atelectasis at the left lung base is largely stable. Right lung clear. Heart size upper limits normal. No pneumothorax.  Atheromatous aorta. Spondylitic changes in the lower thoracic spine as before. IMPRESSION: 1. Left PleurX catheter with stable left pleural effusions/thickening and lower lung opacities as above. Electronically Signed   By: Lucrezia Europe M.D.   On: 07/18/2015 10:30   Dg Chest 2 View  07/18/2015  CLINICAL DATA:  Postop check.  Chest tube. EXAM: CHEST  2 VIEW COMPARISON:  07/16/2015 FINDINGS: Left subclavian Port-A-Cath remains with tip overlying the mid SVC. Left chest tube remains in place. Cardiomediastinal silhouette is unchanged. Small left pleural effusion is unchanged, as is left basilar airspace opacity. No definite pneumothorax is identified. Right lung remains clear. IMPRESSION: Small left pleural effusion and left basilar consolidation, unchanged. Electronically Signed   By: Logan Bores M.D.   On: 07/18/2015 07:35    Assessment & Plan:   # Stage IV adenocarcinoma the lung- Plan outpatient Palliative chemotherapy. We will plan to send out for mutation/ PDL-1 testing.   # Malignant left-sided pleural effusion recurrent- cytology positive for adenocarcinoma;  TTF-1 positive; Marland Kitchen Patient status post pleurodesis. Chest tube taken out yesterday.   # Pain secondary to pleurodesis/chest tube- pt received a total of 4 mg IV dilaudid yesterday; this could be changed to PO dilaudid today; and can be discharged home.   # Fever- likely secondary to  inflammation from pleurodesis/atelectasis- on antibiotics/ improving. DC home on Levaquin 576m for a total of 7 days. Pulmonary toilet.  # Discussed with the patient. Will plan follow up in cancer center/ with me 1 week/plan chemo.   GCammie Sickle MD 07/19/2015  8:02 AM

## 2015-07-19 NOTE — Care Management Important Message (Signed)
Important Message  Patient Details  Name: Charlene Williams MRN: [id number] Date of Birth: Jan 07, 1956   Medicare Important Message Given:  Yes    Juliann Pulse A Kyndal Gloster 07/19/2015, 10:46 AM

## 2015-07-19 NOTE — Evaluation (Signed)
Physical Therapy Evaluation Patient Details Name: Charlene Williams MRN: [id number] DOB: [birth year] Today's Date: 07/19/2015   History of Present Illness  Pt is a 60 y.o. F admitted to hospital for SOB and L chest pain. Pt underwent surgery on 4/21 for L thoracoscopy, talc pleurodesis, pleurx catheter, and port-a-cath. Pt has hx of DMII, COPD, HTN, depression, and adenocarcinoma.   Clinical Impression  Pt is a pleasant 60 y.o. F admitted to hospital for SOB and L chest pain. Pt s/p L throacoscopy, talc pleurodesis, pleurx cath, and port-a-cath on 4/21. Pts chest tube removed on 4/25. Prior to admission, pt lived with family. Pt used SPC for ambulation and required some assist from daugther/aid for ADLs. Pt awake and oriented during evaluation. Pt demonstrated fair B UE and LE strength. Pt able to perform bed mobility with modified independence. Pt able to transfer w/ modified independence. Pt ambulated with SPC and CGA approx 615 ft. Pt demonstrated some instability during gait, however no LOB. Pt performed supine there-ex on B LE. Pt motivated to participate in PT. Pt demonstrates deficits in strength and mobility. Pt would benefit from further skilled PT to address deficits; recommend pt receive home health PT after discharge from acute hospitalization.  SaO2 on room air at rest = 86% SaO2 on room air while exercising = 83% SaO2 on 1L liters of O2 while exercising = 92%    Follow Up Recommendations Home health PT    Equipment Recommendations       Recommendations for Other Services       Precautions / Restrictions Precautions Precautions: Fall Restrictions Weight Bearing Restrictions: No      Mobility  Bed Mobility Overal bed mobility: Modified Independent             General bed mobility comments: Pt able to perform bed mobility with modified independence. Pt required use of railings and increased time to perform mobility.   Transfers Overall transfer level: Modified  independent Equipment used: Straight cane             General transfer comment: Pt able to transfer with [facility] and CGA for safety. Pt required cues regarding scooting to EOB prior to transfer.   Ambulation/Gait Ambulation/Gait assistance: Min guard Ambulation Distance (Feet): 15 Feet Assistive device: Straight cane Gait Pattern/deviations: Step-through pattern Gait velocity: Pt ambulated 10 ft in approx 10 sec, demonstrating fair walking speed.   General Gait Details: Pt able to ambulate using SPC on R side and CGA for safety. Pt demonstrated step-through gait pattern with slight side to side lean during ambulation. Pt demonstrated slight LOB when turning left, however able to regain balance on her own. Pt walked on 1L O2, demonstrating no SOB.  Stairs            Wheelchair Mobility    Modified Rankin (Stroke Patients Only)       Balance Overall balance assessment: Modified Independent (Pt required use of SPC to maintain standing balance )                                           Pertinent Vitals/Pain Pain Assessment: No/denies pain    Home Living Family/patient expects to be discharged to:: Private residence Living Arrangements: Children Available Help at Discharge: Family Type of Home: Apartment Home Access: Stairs to enter Entrance Stairs-Rails: Psychiatric nurse of Steps: 15 Home Layout: One level Home  Equipment: Kasandra Knudsen - single point Additional Comments: After discharge pt will live with daughter for short term. Daughter just moved to 2nd story apartment. Pt stated she lived in place with flight of stairs prior to this move.     Prior Function Level of Independence: Needs assistance   Gait / Transfers Assistance Needed: Pt able to ambulate with SPC. Prefers on R side d/t injury on R ride.   ADL's / Homemaking Assistance Needed: Pt stated she has daughter or aide help with ADLs, can perform some on her own too.   Comments:  Pt active and uses SPC for ambulation. Helps take care of grandchildren.      Hand Dominance        Extremity/Trunk Assessment   Upper Extremity Assessment: RUE deficits/detail;LUE deficits/detail RUE Deficits / Details: R UE grossly 4-/5 strength, good shoulder flex ROM     LUE Deficits / Details: L UE grossly 4-/5 strength, good shoulder flex ROM   Lower Extremity Assessment: RLE deficits/detail;LLE deficits/detail RLE Deficits / Details: R LE grossly 4-/5 strength LLE Deficits / Details: L LE grossly 4-/5 strength     Communication   Communication: No difficulties  Cognition Arousal/Alertness: Awake/alert Behavior During Therapy: WFL for tasks assessed/performed Overall Cognitive Status: Within Functional Limits for tasks assessed                      General Comments      Exercises Other Exercises Other Exercises: Pt performed supine ther-ex on B LE including SLR, ankle pumps, hip ab/ad, and SAQ with min to no assist. All ther-ex performed x10 reps. Pt educated on doing ther-ex on her own to improve strength.  Other Exercises: Pt ambulated approx 600 ft using SPC and CGA for safety. Pt provided cues regarding upright posture and maintaining balance with turns. Pt demonstrated slight increase in gait speed during ambulation.       Assessment/Plan    PT Assessment Patient needs continued PT services  PT Diagnosis Abnormality of gait;Difficulty walking   PT Problem List Decreased strength;Decreased mobility  PT Treatment Interventions Gait training;Stair training;Therapeutic exercise   PT Goals (Current goals can be found in the Care Plan section) Acute Rehab PT Goals Patient Stated Goal: to go home PT Goal Formulation: With patient Time For Goal Achievement: 08/02/15 Potential to Achieve Goals: Good    Frequency Min 2X/week   Barriers to discharge        Co-evaluation               End of Session Equipment Utilized During Treatment: Gait  belt;Oxygen Activity Tolerance: Patient tolerated treatment well Patient left: in chair;with call bell/phone within reach;with chair alarm set;with family/visitor present Nurse Communication: Mobility status         Time: 5366-4403 PT Time Calculation (min) (ACUTE ONLY): 32 min   Charges:         PT G Codes:        Sherral Hammers 2015-08-08, 10:59 AM M. Barnett Abu, SPT

## 2015-07-19 NOTE — Discharge Summary (Signed)
Charlene Williams at [place] NAME: Charlene Williams    MR#:  [id number]  DATE OF BIRTH:  07-23-1955  DATE OF ADMISSION:  07/11/2015 ADMITTING PHYSICIAN: Henreitta Leber, MD  DATE OF DISCHARGE: 07/19/2015  PRIMARY CARE PHYSICIAN: HOLLAND, [place], NP    ADMISSION DIAGNOSIS:  COPD Exacerbation Hypoxia bbb removal of pleurx cath  DISCHARGE DIAGNOSIS:  Active Problems:   COPD exacerbation ([place])   Recurrent left pleural effusion   Pleural effusion   SECONDARY DIAGNOSIS:   Past Medical History  Diagnosis Date  . Diabetes mellitus without complication ([place])   . Sleep apnea   . Hypertension   . Aneurysm ([place])   . Hiatal hernia   . COPD (chronic obstructive pulmonary disease) ([place])   . Illicit drug use   . Migraines   . Hypercholesteremia   . Depression   . Spine disorder   . Family history of chronic pain 01/17/2015  . Cancer [facility])     HOSPITAL COURSE:   60 year old female with past medical history of hypertension, hyperlipidemia, diabetes, COPD, respectively sleep apnea, migraines, chronic pain who presents to the hospital shortness of breath and left-sided pleuritic chest pain.  #1. Malignant pleural effusion due to adenocarcinoma - patient was admitted to the hospital due to a recurrent pleural effusion. She underwent ultrasound-guided thoracentesis and the fluid was malignant. Post thoracentesis patient developed a pneumothorax and underwent a chest tube placement, Pleurx catheter placement given her recurrent pleural effusion. -Patient is also status post pleurodesis and decortication. Patient was followed by cardiothoracic surgery and now her chest tube in Pleurx catheter has been removed. She is clinically not hypoxic and not having any further pleuritic chest pain. -She is being discharged home with follow-up with oncology as an outpatient to initiate chemotherapy.   #2 Fever -this was secondary to pneumonia. Fever has resolved  now. -Her blood cultures are negative. Her urinalysis was negative. She is being discharged on a 7 day course of oral Levaquin.  #3. Essential hypertension - she will continue her enalapril.  #4. Diabetes mellitus - on the hospital patient was maintained on sliding scale insulin. She is going to resume her metformin upon discharge.  #5. COPD -patient did have a mild acute exacerbation  and this was treated with IV steroids, DuoNeb's, Pulmicort nebs. She is significantly improved. She is off steroids, she didn't qualify for home oxygen which is being arranged for her prior to discharge.  #6. LLL mass - malignant given recurrent pleural effusion. -Patient has been seen by oncology and the plan for treatment as an outpatient in next coming weeks. Patient already has a Port-A-Cath placed. She'll follow up with the cancer Center next week. - appreciate Oncology consult plan for treatment as outpatient in the next coming weeks.   #7. Acute on chronic respiratory failure with hypoxia-due to recurrent pleural effusion, pneumothorax and also Lung mass suspicious for cancer. -Patient didn't qualify for home oxygen which is being arranged for her prior to discharge.  8. Depression-she will continue her Cymbalta.  DISCHARGE CONDITIONS:   Stable  CONSULTS OBTAINED:  Treatment Team:  Cammie Sickle, MD Nestor Lewandowsky, MD  DRUG ALLERGIES:   Allergies  Allergen Reactions  . Ampicillin Hives and Other (See Comments)    Has patient had a PCN reaction causing immediate rash, facial/tongue/throat swelling, SOB or lightheadedness with hypotension: No Has patient had a PCN reaction causing severe rash involving mucus membranes or skin necrosis: No Has patient had a  PCN reaction that required hospitalization No Has patient had a PCN reaction occurring within the last 10 years: Yes If all of the above answers are "NO", then may proceed with Cephalosporin use.  Marland Kitchen Penicillins Hives and Other (See  Comments)    Has patient had a PCN reaction causing immediate rash, facial/tongue/throat swelling, SOB or lightheadedness with hypotension: No Has patient had a PCN reaction causing severe rash involving mucus membranes or skin necrosis: No Has patient had a PCN reaction that required hospitalization No Has patient had a PCN reaction occurring within the last 10 years: Yes If all of the above answers are "NO", then may proceed with Cephalosporin use.    DISCHARGE MEDICATIONS:   Current Discharge Medication List    START taking these medications   Details  HYDROmorphone (DILAUDID) 2 MG tablet Take 0.5 tablets (1 mg total) by mouth every 4 (four) hours as needed for severe pain. Qty: 30 tablet, Refills: 0      CONTINUE these medications which have CHANGED   Details  levofloxacin (LEVAQUIN) 750 MG tablet Take 1 tablet (750 mg total) by mouth daily. Qty: 7 tablet, Refills: 0      CONTINUE these medications which have NOT CHANGED   Details  alendronate (FOSAMAX) 70 MG tablet Take 70 mg by mouth once a week. Pt takes on Tuesday.   Take with a full glass of water on an empty stomach.    aspirin EC 81 MG tablet Take 81 mg by mouth daily.    cetirizine (ZYRTEC) 10 MG tablet Take 10 mg by mouth at bedtime.    DULoxetine (CYMBALTA) 30 MG capsule Take 90 mg by mouth daily.     enalapril (VASOTEC) 10 MG tablet Take 10 mg by mouth daily.     fluticasone (FLOVENT HFA) 220 MCG/ACT inhaler Inhale 1 puff into the lungs 2 (two) times daily.    gabapentin (NEURONTIN) 800 MG tablet Take 800 mg by mouth 4 (four) times daily.    magnesium oxide (MAG-OX) 400 (241.3 Mg) MG tablet Take 400 mg by mouth at bedtime.    metFORMIN (GLUCOPHAGE-XR) 750 MG 24 hr tablet Take 750 mg by mouth daily with breakfast.    metoprolol tartrate (LOPRESSOR) 25 MG tablet Take 1 tablet (25 mg total) by mouth 2 (two) times daily. Qty: 60 tablet, Refills: 0    mirtazapine (REMERON) 45 MG tablet Take 45 mg by mouth at  bedtime.    nicotine (NICODERM CQ - DOSED IN MG/24 HOURS) 21 mg/24hr patch Place 1 patch (21 mg total) onto the skin daily. Qty: 28 patch, Refills: 0    omeprazole (PRILOSEC) 20 MG capsule Take 20 mg by mouth daily before breakfast.     simvastatin (ZOCOR) 20 MG tablet Take 20 mg by mouth at bedtime.     traZODone (DESYREL) 150 MG tablet Take 150 mg by mouth at bedtime.    ondansetron (ZOFRAN) 8 MG tablet Take 1 tablet (8 mg total) by mouth every 8 (eight) hours as needed for nausea or vomiting (start 3 days; after chemo). 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 Qty: 30 tablet, Refills: 0   Associated Diagnoses: Primary cancer of left lower lobe of lung ([place]); Primary cancer of  left lung metastatic to other site [facility])      STOP taking these medications     folic acid (FOLVITE) 1 MG tablet      lidocaine-prilocaine (EMLA) cream          DISCHARGE INSTRUCTIONS:   DIET:  Cardiac diet and Diabetic diet  DISCHARGE CONDITION:  Stable  ACTIVITY:  Activity as tolerated  OXYGEN:  Home Oxygen: Yes.     Oxygen Delivery: 2 liters/min via Patient connected to nasal cannula oxygen  DISCHARGE LOCATION:  Home with home health nursing, physical therapy.   If you experience worsening of your admission symptoms, develop shortness of breath, life threatening emergency, suicidal or homicidal thoughts you must seek medical attention immediately by calling 911 or calling your MD immediately  if symptoms less severe.  You Must read complete instructions/literature along with all the possible adverse reactions/side effects for all the Medicines you take and that have been prescribed to you. Take any new Medicines after you have completely  understood and accpet all the possible adverse reactions/side effects.   Please note  You were cared for by a hospitalist during your hospital stay. If you have any questions about your discharge medications or the care you received while you were in the hospital after you are discharged, you can call the unit and asked to speak with the hospitalist on call if the hospitalist that took care of you is not available. Once you are discharged, your primary care physician will handle any further medical issues. Please note that NO REFILLS for any discharge medications will be authorized once you are discharged, as it is imperative that you return to your primary care physician (or establish a relationship with a primary care physician if you do not have one) for your aftercare needs so that they can reassess your need for medications and monitor your lab values.     Today   Still having some pain near the chest tube removal site, no shortness of breath, nausea, vomiting. Constipated.  VITAL SIGNS:  Blood pressure 112/59, pulse 91, temperature 98.7 F (37.1 C), temperature source Oral, resp. rate 18, height '5\' 3"'$  (1.6 m), weight 78.835 kg (173 lb 12.8 oz), SpO2 97 %.  I/O:   Intake/Output Summary (Last 24 hours) at 07/19/15 1349 Last data filed at 07/19/15 1300  Gross per 24 hour  Intake    662 ml  Output    800 ml  Net   -138 ml    PHYSICAL EXAMINATION:   GENERAL: 60 y.o.-year-old patient lying in the bed in no apparent distress.  EYES: Pupils pinpoint but reactive, round, reactive to light and accommodation. No scleral icterus. Extraocular muscles intact.  HEENT: Head atraumatic, normocephalic. Oropharynx and nasopharynx clear.  NECK: Supple, no jugular venous distention. No thyroid enlargement, no tenderness.  LUNGS: Normal breath sounds bilaterally, no wheezing, Rhonchi, rales. No use of accessory muscles of respiration. CARDIOVASCULAR: S1, S2 normal. No murmurs, rubs, or  gallops.  ABDOMEN: Soft, nontender, nondistended. Bowel sounds present. No organomegaly or mass.  EXTREMITIES: No cyanosis, clubbing or edema b/l.  NEUROLOGIC: Cranial nerves II through XII are intact. No focal Motor or sensory deficits b/l.  PSYCHIATRIC: The patient is alert and oriented x 3.  SKIN: No obvious rash, lesion, or ulcer.   DATA REVIEW:   CBC  Recent Labs Lab 07/18/15 0503  WBC 6.3  HGB 10.9*  HCT 32.8*  PLT 306    Chemistries   Recent Labs Lab 07/18/15 0503  NA 137  K 4.2  CL 100*  CO2 31  GLUCOSE 128*  BUN 8  CREATININE 0.82  CALCIUM 9.0    Cardiac Enzymes No results for input(s): TROPONINI in the last 168 hours.  Microbiology Results  Results for orders placed or performed during the hospital encounter of 07/11/15  CULTURE, BLOOD (ROUTINE X 2) w Reflex to PCR ID Panel     Status: None (Preliminary result)   Collection Time: 07/16/15 11:31 AM  Result Value Ref Range Status   Specimen Description BLOOD RIGHT WRIST  Final   Special Requests   Final    BOTTLES DRAWN AEROBIC AND ANAEROBIC  AER 4CC ANA 2CC   Culture NO GROWTH 2 DAYS  Final   Report Status PENDING  Incomplete  CULTURE, BLOOD (ROUTINE X 2) w Reflex to PCR ID Panel     Status: None (Preliminary result)   Collection Time: 07/16/15 11:40 AM  Result Value Ref Range Status   Specimen Description BLOOD LEFT WRIST  Final   Special Requests   Final    BOTTLES DRAWN AEROBIC AND ANAEROBIC  AER 5CC ANA 1CC   Culture NO GROWTH 2 DAYS  Final   Report Status PENDING  Incomplete    RADIOLOGY:  Dg Chest 2 View  07/18/2015  CLINICAL DATA:  Malignant left-sided pleural effusion recurrent- cytology positive for adenocarcinoma; TTF-1 positive; Suggestive of lung origin. Patient status post pleurodesis/chest tube placement for pneumothorax, may have catherer removed today EXAM: CHEST - 2 VIEW COMPARISON:  07/18/2015 FINDINGS: Left subclavian port catheter to the proximal SVC. Left PleurX  catheter stable in position. Little change in loculated lateral left pleural effusion /thickening. Left infrahilar masslike consolidation stable. Consolidation/ atelectasis at the left lung base is largely stable. Right lung clear. Heart size upper limits normal. No pneumothorax.  Atheromatous aorta. Spondylitic changes in the lower thoracic spine as before. IMPRESSION: 1. Left PleurX catheter with stable left pleural effusions/thickening and lower lung opacities as above. Electronically Signed   By: Lucrezia Europe M.D.   On: 07/18/2015 10:30   Dg Chest 2 View  07/18/2015  CLINICAL DATA:  Postop check.  Chest tube. EXAM: CHEST  2 VIEW COMPARISON:  07/16/2015 FINDINGS: Left subclavian Port-A-Cath remains with tip overlying the mid SVC. Left chest tube remains in place. Cardiomediastinal silhouette is unchanged. Small left pleural effusion is unchanged, as is left basilar airspace opacity. No definite pneumothorax is identified. Right lung remains clear. IMPRESSION: Small left pleural effusion and left basilar consolidation, unchanged. Electronically Signed   By: Logan Bores M.D.   On: 07/18/2015 07:35      Management plans discussed with the patient, family and they are in agreement.  CODE STATUS:     Code Status Orders        Start     Ordered   07/11/15 1725  Full code   Continuous     07/11/15 1725    Code Status History    Date Active Date Inactive Code Status Order ID Comments User Context   06/25/2015  6:35 PM 06/27/2015  4:00 PM Full Code [id number]  Theodoro Grist, MD ED      TOTAL TIME TAKING CARE OF THIS PATIENT: 40 minutes.    Henreitta Leber M.D on 07/19/2015 at 1:49 PM  Between 7am to 6pm - Pager - [phone]  After 6pm go to [email or web address] - password [email or web address] [facility]  [facility] [place] [facility]  Office  [phone]  CC: Primary care physician; Kasandra Knudsen, NP

## 2015-07-19 NOTE — Progress Notes (Signed)
SATURATION QUALIFICATIONS: (This note is used to comply with regulatory documentation for home oxygen)  Patient Saturations on Room Air at Rest = 86%   Patient Saturations on Room Air while Ambulating = 83% - this was just bed exercise  Patient Saturations on 1 Liters of oxygen while Ambulating = 93 %  Please briefly explain why patient needs home oxygen: Pt has Stage IV lung ca; had thoracentesis, Chest Tube and pleurex during admission.

## 2015-07-20 ENCOUNTER — Other Ambulatory Visit: Payer: Self-pay | Admitting: *Deleted

## 2015-07-20 ENCOUNTER — Inpatient Hospital Stay: Payer: Medicare Other | Attending: Internal Medicine

## 2015-07-20 ENCOUNTER — Other Ambulatory Visit: Payer: Self-pay | Admitting: Internal Medicine

## 2015-07-20 DIAGNOSIS — C3492 Malignant neoplasm of unspecified part of left bronchus or lung: Secondary | ICD-10-CM | POA: Diagnosis not present

## 2015-07-20 MED ORDER — LIDOCAINE-PRILOCAINE 2.5-2.5 % EX CREA: 1.0000 "application " | TOPICAL_CREAM | CUTANEOUS | Status: DC | PRN

## 2015-07-20 MED ORDER — CYANOCOBALAMIN 1000 MCG/ML IJ SOLN
1000.0000 ug | Freq: Once | INTRAMUSCULAR | Status: AC
  Administered 2015-07-20: 1000 ug via INTRAMUSCULAR
  Filled 2015-07-20: qty 1

## 2015-07-20 NOTE — Clinical Social Work Note (Signed)
Pt discharge home with home health services. Pt was agreeable to discharge plan as she declined placement. Pt shared that she updated her mental health worker. CSW is signing off as no further needs identified.   Darden Dates, MSW, LCSW  Clinical Social Worker  [phone]

## 2015-07-21 LAB — CULTURE, BLOOD (ROUTINE X 2)
CULTURE: NO GROWTH
Culture: NO GROWTH

## 2015-07-24 ENCOUNTER — Encounter: Payer: Self-pay | Admitting: Emergency Medicine

## 2015-07-24 ENCOUNTER — Emergency Department: Payer: Medicare Other

## 2015-07-24 ENCOUNTER — Emergency Department
Admission: EM | Admit: 2015-07-24 | Discharge: 2015-07-24 | Disposition: A | Discharge status: Home / Self Care | Payer: Medicare Other | Attending: Emergency Medicine | Admitting: Emergency Medicine

## 2015-07-24 DIAGNOSIS — Y929 Unspecified place or not applicable: Secondary | ICD-10-CM | POA: Insufficient documentation

## 2015-07-24 DIAGNOSIS — Z8669 Personal history of other diseases of the nervous system and sense organs: Secondary | ICD-10-CM | POA: Insufficient documentation

## 2015-07-24 DIAGNOSIS — Z79899 Other long term (current) drug therapy: Secondary | ICD-10-CM | POA: Insufficient documentation

## 2015-07-24 DIAGNOSIS — M1990 Unspecified osteoarthritis, unspecified site: Secondary | ICD-10-CM | POA: Diagnosis not present

## 2015-07-24 DIAGNOSIS — F17210 Nicotine dependence, cigarettes, uncomplicated: Secondary | ICD-10-CM | POA: Diagnosis not present

## 2015-07-24 DIAGNOSIS — I10 Essential (primary) hypertension: Secondary | ICD-10-CM | POA: Insufficient documentation

## 2015-07-24 DIAGNOSIS — E119 Type 2 diabetes mellitus without complications: Secondary | ICD-10-CM | POA: Insufficient documentation

## 2015-07-24 DIAGNOSIS — Y999 Unspecified external cause status: Secondary | ICD-10-CM | POA: Diagnosis not present

## 2015-07-24 DIAGNOSIS — F329 Major depressive disorder, single episode, unspecified: Secondary | ICD-10-CM | POA: Diagnosis not present

## 2015-07-24 DIAGNOSIS — X501XXA Overexertion from prolonged static or awkward postures, initial encounter: Secondary | ICD-10-CM | POA: Insufficient documentation

## 2015-07-24 DIAGNOSIS — Z85118 Personal history of other malignant neoplasm of bronchus and lung: Secondary | ICD-10-CM | POA: Insufficient documentation

## 2015-07-24 DIAGNOSIS — M25571 Pain in right ankle and joints of right foot: Secondary | ICD-10-CM | POA: Diagnosis present

## 2015-07-24 DIAGNOSIS — M4806 Spinal stenosis, lumbar region: Secondary | ICD-10-CM | POA: Insufficient documentation

## 2015-07-24 DIAGNOSIS — Z7984 Long term (current) use of oral hypoglycemic drugs: Secondary | ICD-10-CM | POA: Insufficient documentation

## 2015-07-24 DIAGNOSIS — Y939 Activity, unspecified: Secondary | ICD-10-CM | POA: Diagnosis not present

## 2015-07-24 DIAGNOSIS — Z7982 Long term (current) use of aspirin: Secondary | ICD-10-CM | POA: Diagnosis not present

## 2015-07-24 DIAGNOSIS — Z7951 Long term (current) use of inhaled steroids: Secondary | ICD-10-CM | POA: Diagnosis not present

## 2015-07-24 DIAGNOSIS — J441 Chronic obstructive pulmonary disease with (acute) exacerbation: Secondary | ICD-10-CM | POA: Insufficient documentation

## 2015-07-24 DIAGNOSIS — S93401A Sprain of unspecified ligament of right ankle, initial encounter: Secondary | ICD-10-CM | POA: Diagnosis not present

## 2015-07-24 MED ORDER — OXYCODONE-ACETAMINOPHEN 5-325 MG PO TABS
1.0000 | ORAL_TABLET | Freq: Once | ORAL | Status: AC
  Administered 2015-07-24: 1 via ORAL
  Filled 2015-07-24: qty 1

## 2015-07-24 MED ORDER — KETOROLAC TROMETHAMINE 30 MG/ML IJ SOLN
30.0000 mg | Freq: Once | INTRAMUSCULAR | Status: AC
  Administered 2015-07-24: 30 mg via INTRAMUSCULAR
  Filled 2015-07-24: qty 1

## 2015-07-24 MED ORDER — OXYCODONE-ACETAMINOPHEN 5-325 MG PO TABS: 1.0000 | ORAL_TABLET | ORAL | Status: DC | PRN
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
## 2015-07-24 NOTE — ED Notes (Signed)
Pt reports right ankle pain since yesterday; pt reports she tripped. Pt with redness and swelling to right ankle.

## 2015-07-24 NOTE — Discharge Instructions (Signed)
Ankle Sprain An ankle sprain is an injury to the strong, fibrous tissues (ligaments) that hold the bones of your ankle joint together.  CAUSES An ankle sprain is usually caused by a fall or by twisting your ankle. Ankle sprains most commonly occur when you step on the outer edge of your foot, and your ankle turns inward. People who participate in sports are more prone to these types of injuries.  SYMPTOMS   Pain in your ankle. The pain may be present at rest or only when you are trying to stand or walk.  Swelling.  Bruising. Bruising may develop immediately or within 1 to 2 days after your injury.  Difficulty standing or walking, particularly when turning corners or changing directions. DIAGNOSIS  Your caregiver will ask you details about your injury and perform a physical exam of your ankle to determine if you have an ankle sprain. During the physical exam, your caregiver will press on and apply pressure to specific areas of your foot and ankle. Your caregiver will try to move your ankle in certain ways. An X-ray exam may be done to be sure a bone was not broken or a ligament did not separate from one of the bones in your ankle (avulsion fracture).  TREATMENT  Certain types of braces can help stabilize your ankle. Your caregiver can make a recommendation for this. Your caregiver may recommend the use of medicine for pain. If your sprain is severe, your caregiver may refer you to a surgeon who helps to restore function to parts of your skeletal system (orthopedist) or a physical therapist. [place] ice to your injury for 1-2 days or as directed by your caregiver. Applying ice helps to reduce inflammation and pain.  Put ice in a plastic bag.  Place a towel between your skin and the bag.  Leave the ice on for 15-20 minutes at a time, every 2 hours while you are awake.  Only take over-the-counter or prescription medicines for pain, discomfort, or fever as directed by  your caregiver.  Elevate your injured ankle above the level of your heart as much as possible for 2-3 days.  If your caregiver recommends crutches, use them as instructed. Gradually put weight on the affected ankle. Continue to use crutches or a cane until you can walk without feeling pain in your ankle.  If you have a plaster splint, wear the splint as directed by your caregiver. Do not rest it on anything harder than a pillow for the first 24 hours. Do not put weight on it. Do not get it wet. You may take it off to take a shower or bath.  You may have been given an elastic bandage to wear around your ankle to provide support. If the elastic bandage is too tight (you have numbness or tingling in your foot or your foot becomes cold and blue), adjust the bandage to make it comfortable.  If you have an air splint, you may blow more air into it or let air out to make it more comfortable. You may take your splint off at night and before taking a shower or bath. Wiggle your toes in the splint several times per day to decrease swelling. SEEK MEDICAL CARE IF:   You have rapidly increasing bruising or swelling.  Your toes feel extremely cold or you lose feeling in your foot.  Your pain is not relieved with medicine. SEEK IMMEDIATE MEDICAL CARE IF:  Your toes are numb or blue.  You have severe pain that is increasing. MAKE SURE YOU:   Understand these instructions.  Will watch your condition.  Will get help right away if you are not doing well or get worse.   This information is not intended to replace advice given to you by your health care provider. Make sure you discuss any questions you have with your health care provider.   Document Released: 03/11/2005 Document Revised: 04/01/2014 Document Reviewed: 03/23/2011 Elsevier Interactive Patient Education 2016 [place].  Cryotherapy Cryotherapy means treatment with cold. Ice or gel packs can be used to reduce both pain and swelling.  Ice is the most helpful within the first 24 to 48 hours after an injury or flare-up from overusing a muscle or joint. Sprains, strains, spasms, burning pain, shooting pain, and aches can all be eased with ice. Ice can also be used when recovering from surgery. Ice is effective, has very few side effects, and is safe for most people to use. PRECAUTIONS  Ice is not a safe treatment option for people with:  Raynaud phenomenon. This is a condition affecting small blood vessels in the extremities. Exposure to cold may cause your problems to return.  Cold hypersensitivity. There are many forms of cold hypersensitivity, including:  Cold urticaria. Red, itchy hives appear on the skin when the tissues begin to warm after being iced.  Cold erythema. This is a red, itchy rash caused by exposure to cold.  Cold hemoglobinuria. Red blood cells break down when the tissues begin to warm after being iced. The hemoglobin that carry oxygen are passed into the urine because they cannot combine with blood proteins fast enough.  Numbness or altered sensitivity in the area being iced. If you have any of the following conditions, do not use ice until you have discussed cryotherapy with your caregiver:  Heart conditions, such as arrhythmia, angina, or chronic heart disease.  High blood pressure.  Healing wounds or open skin in the area being iced.  Current infections.  Rheumatoid arthritis.  Poor circulation.  Diabetes. Ice slows the blood flow in the region it is applied. This is beneficial when trying to stop inflamed tissues from spreading irritating chemicals to surrounding tissues. However, if you expose your skin to cold temperatures for too long or without the proper protection, you can damage your skin or nerves. Watch for signs of skin damage due to cold. HOME CARE INSTRUCTIONS Follow these tips to use ice and cold packs safely.  Place a dry or damp towel between the ice and skin. A damp towel will  cool the skin more quickly, so you may need to shorten the time that the ice is used.  For a more rapid response, add gentle compression to the ice.  Ice for no more than 10 to 20 minutes at a time. The bonier the area you are icing, the less time it will take to get the benefits of ice.  Check your skin after 5 minutes to make sure there are no signs of a poor response to cold or skin damage.  Rest 20 minutes or more between uses.  Once your skin is numb, you can end your treatment. You can test numbness by very lightly touching your skin. The touch should be so light that you do not see the skin dimple from the pressure of your fingertip. When using ice, most people will feel these normal sensations in this order: cold, burning, aching, and numbness.  Do not use ice on someone who  cannot communicate their responses to pain, such as small children or people with dementia. HOW TO MAKE AN ICE PACK Ice packs are the most common way to use ice therapy. Other methods include ice massage, ice baths, and cryosprays. Muscle creams that cause a cold, tingly feeling do not offer the same benefits that ice offers and should not be used as a substitute unless recommended by your caregiver. To make an ice pack, do one of the following:  Place crushed ice or a bag of frozen vegetables in a sealable plastic bag. Squeeze out the excess air. Place this bag inside another plastic bag. Slide the bag into a pillowcase or place a damp towel between your skin and the bag.  Mix 3 parts water with 1 part rubbing alcohol. Freeze the mixture in a sealable plastic bag. When you remove the mixture from the freezer, it will be slushy. Squeeze out the excess air. Place this bag inside another plastic bag. Slide the bag into a pillowcase or place a damp towel between your skin and the bag. SEEK MEDICAL CARE IF:  You develop white spots on your skin. This may give the skin a blotchy (mottled) appearance.  Your skin turns  blue or pale.  Your skin becomes waxy or hard.  Your swelling gets worse. MAKE SURE YOU:   Understand these instructions.  Will watch your condition.  Will get help right away if you are not doing well or get worse.   This information is not intended to replace advice given to you by your health care provider. Make sure you discuss any questions you have with your health care provider.   Document Released: 11/05/2010 Document Revised: 04/01/2014 Document Reviewed: 11/05/2010 Elsevier Interactive Patient Education [employer].

## 2015-07-25 ENCOUNTER — Inpatient Hospital Stay: Payer: Medicare Other | Attending: Internal Medicine

## 2015-07-25 DIAGNOSIS — E7800 Pure hypercholesterolemia, unspecified: Secondary | ICD-10-CM | POA: Insufficient documentation

## 2015-07-25 DIAGNOSIS — J449 Chronic obstructive pulmonary disease, unspecified: Secondary | ICD-10-CM | POA: Insufficient documentation

## 2015-07-25 DIAGNOSIS — M79604 Pain in right leg: Secondary | ICD-10-CM | POA: Insufficient documentation

## 2015-07-25 DIAGNOSIS — C3432 Malignant neoplasm of lower lobe, left bronchus or lung: Secondary | ICD-10-CM | POA: Insufficient documentation

## 2015-07-25 DIAGNOSIS — Z79899 Other long term (current) drug therapy: Secondary | ICD-10-CM | POA: Insufficient documentation

## 2015-07-25 DIAGNOSIS — M7989 Other specified soft tissue disorders: Secondary | ICD-10-CM | POA: Insufficient documentation

## 2015-07-25 DIAGNOSIS — Z7689 Persons encountering health services in other specified circumstances: Secondary | ICD-10-CM | POA: Insufficient documentation

## 2015-07-25 DIAGNOSIS — F17210 Nicotine dependence, cigarettes, uncomplicated: Secondary | ICD-10-CM | POA: Insufficient documentation

## 2015-07-25 DIAGNOSIS — Z5111 Encounter for antineoplastic chemotherapy: Secondary | ICD-10-CM | POA: Insufficient documentation

## 2015-07-25 DIAGNOSIS — J910 Malignant pleural effusion: Secondary | ICD-10-CM | POA: Insufficient documentation

## 2015-07-25 DIAGNOSIS — I10 Essential (primary) hypertension: Secondary | ICD-10-CM | POA: Insufficient documentation

## 2015-07-25 DIAGNOSIS — Z7982 Long term (current) use of aspirin: Secondary | ICD-10-CM | POA: Insufficient documentation

## 2015-07-25 DIAGNOSIS — E119 Type 2 diabetes mellitus without complications: Secondary | ICD-10-CM | POA: Insufficient documentation

## 2015-07-27 ENCOUNTER — Inpatient Hospital Stay: Payer: Medicare Other

## 2015-07-27 ENCOUNTER — Telehealth: Payer: Self-pay | Admitting: Pharmacist

## 2015-07-27 ENCOUNTER — Inpatient Hospital Stay (HOSPITAL_BASED_OUTPATIENT_CLINIC_OR_DEPARTMENT_OTHER): Payer: Medicare Other | Admitting: Internal Medicine

## 2015-07-27 ENCOUNTER — Ambulatory Visit
Admission: RE | Admit: 2015-07-27 | Discharge: 2015-07-27 | Disposition: A | Discharge status: Home / Self Care | Payer: Medicare Other | Source: Ambulatory Visit | Attending: Internal Medicine | Admitting: Internal Medicine

## 2015-07-27 VITALS — BP 112/69 | HR 110 | Temp 99.0°F | Resp 22 | Wt 164.2 lb

## 2015-07-27 DIAGNOSIS — I2699 Other pulmonary embolism without acute cor pulmonale: Secondary | ICD-10-CM | POA: Diagnosis not present

## 2015-07-27 DIAGNOSIS — R600 Localized edema: Secondary | ICD-10-CM

## 2015-07-27 DIAGNOSIS — C3492 Malignant neoplasm of unspecified part of left bronchus or lung: Secondary | ICD-10-CM

## 2015-07-27 DIAGNOSIS — J910 Malignant pleural effusion: Secondary | ICD-10-CM | POA: Diagnosis not present

## 2015-07-27 DIAGNOSIS — M7989 Other specified soft tissue disorders: Secondary | ICD-10-CM

## 2015-07-27 DIAGNOSIS — Z7982 Long term (current) use of aspirin: Secondary | ICD-10-CM

## 2015-07-27 DIAGNOSIS — C3432 Malignant neoplasm of lower lobe, left bronchus or lung: Secondary | ICD-10-CM | POA: Diagnosis not present

## 2015-07-27 DIAGNOSIS — M79604 Pain in right leg: Secondary | ICD-10-CM | POA: Diagnosis not present

## 2015-07-27 DIAGNOSIS — Z4802 Encounter for removal of sutures: Secondary | ICD-10-CM

## 2015-07-27 DIAGNOSIS — F17210 Nicotine dependence, cigarettes, uncomplicated: Secondary | ICD-10-CM

## 2015-07-27 DIAGNOSIS — J449 Chronic obstructive pulmonary disease, unspecified: Secondary | ICD-10-CM

## 2015-07-27 DIAGNOSIS — I10 Essential (primary) hypertension: Secondary | ICD-10-CM

## 2015-07-27 DIAGNOSIS — E7800 Pure hypercholesterolemia, unspecified: Secondary | ICD-10-CM

## 2015-07-27 DIAGNOSIS — Z79899 Other long term (current) drug therapy: Secondary | ICD-10-CM

## 2015-07-27 LAB — CBC WITH DIFFERENTIAL/PLATELET
BASOS ABS: 0 10*3/uL (ref 0–0.1)
BASOS PCT: 0 %
EOS ABS: 0.2 10*3/uL (ref 0–0.7)
Eosinophils Relative: 3 %
HCT: 31.7 % — ABNORMAL LOW (ref 35.0–47.0)
HEMOGLOBIN: 10.6 g/dL — AB (ref 12.0–16.0)
Lymphocytes Relative: 28 %
Lymphs Abs: 2.5 10*3/uL (ref 1.0–3.6)
MCH: 27.6 pg (ref 26.0–34.0)
MCHC: 33.4 g/dL (ref 32.0–36.0)
MCV: 82.8 fL (ref 80.0–100.0)
MONOS PCT: 9 %
Monocytes Absolute: 0.7 10*3/uL (ref 0.2–0.9)
NEUTROS PCT: 60 %
Neutro Abs: 5.2 10*3/uL (ref 1.4–6.5)
Platelets: 350 10*3/uL (ref 150–440)
RBC: 3.83 MIL/uL (ref 3.80–5.20)
RDW: 14.8 % — ABNORMAL HIGH (ref 11.5–14.5)
WBC: 8.6 10*3/uL (ref 3.6–11.0)

## 2015-07-27 LAB — COMPREHENSIVE METABOLIC PANEL
ALBUMIN: 2.8 g/dL — AB (ref 3.5–5.0)
ALK PHOS: 69 U/L (ref 38–126)
ALT: 16 U/L (ref 14–54)
ANION GAP: 6 (ref 5–15)
AST: 16 U/L (ref 15–41)
BUN: 14 mg/dL (ref 6–20)
CALCIUM: 9.1 mg/dL (ref 8.9–10.3)
CO2: 27 mmol/L (ref 22–32)
Chloride: 106 mmol/L (ref 101–111)
Creatinine, Ser: 0.87 mg/dL (ref 0.44–1.00)
GFR calc Af Amer: >60 mL/min (ref >60)
GFR calc non Af Amer: >60 mL/min (ref >60)
GLUCOSE: 103 mg/dL — AB (ref 65–99)
POTASSIUM: 4.4 mmol/L (ref 3.5–5.1)
SODIUM: 139 mmol/L (ref 135–145)
Total Bilirubin: 0.2 mg/dL — ABNORMAL LOW (ref 0.3–1.2)
Total Protein: 6.3 g/dL — ABNORMAL LOW (ref 6.5–8.1)

## 2015-07-27 MED ORDER — SODIUM CHLORIDE 0.9 % IV SOLN
Freq: Once | INTRAVENOUS | Status: AC
Start: 2015-07-27 — End: 2015-07-27
  Administered 2015-07-27: 10:00:00 via INTRAVENOUS
  Filled 2015-07-27: qty 1000

## 2015-07-27 MED ORDER — PEGFILGRASTIM 6 MG/0.6ML ~~LOC~~ PSKT
6.0000 mg | PREFILLED_SYRINGE | Freq: Once | SUBCUTANEOUS | Status: AC
  Administered 2015-07-27: 6 mg via SUBCUTANEOUS
  Filled 2015-07-27: qty 0.6

## 2015-07-27 MED ORDER — DEXAMETHASONE SODIUM PHOSPHATE 100 MG/10ML IJ SOLN
10.0000 mg | Freq: Once | INTRAMUSCULAR | Status: AC
  Administered 2015-07-27: 10 mg via INTRAVENOUS
  Filled 2015-07-27: qty 1

## 2015-07-27 MED ORDER — DEXAMETHASONE 4 MG PO TABS: ORAL_TABLET | ORAL | Status: DC

## 2015-07-27 MED ORDER — HEPARIN SOD (PORK) LOCK FLUSH 100 UNIT/ML IV SOLN
500.0000 [IU] | Freq: Once | INTRAVENOUS | Status: AC | PRN
  Administered 2015-07-27: 500 [IU]
  Filled 2015-07-27: qty 5

## 2015-07-27 MED ORDER — PALONOSETRON HCL INJECTION 0.25 MG/5ML
0.2500 mg | Freq: Once | INTRAVENOUS | Status: AC
  Administered 2015-07-27: 0.25 mg via INTRAVENOUS
  Filled 2015-07-27: qty 5

## 2015-07-27 MED ORDER — SODIUM CHLORIDE 0.9 % IV SOLN
547.5000 mg | Freq: Once | INTRAVENOUS | Status: AC
  Administered 2015-07-27: 550 mg via INTRAVENOUS
  Filled 2015-07-27: qty 55

## 2015-07-27 MED ORDER — SODIUM CHLORIDE 0.9 % IV SOLN
500.0000 mg/m2 | Freq: Once | INTRAVENOUS | Status: AC
  Administered 2015-07-27: 925 mg via INTRAVENOUS
  Filled 2015-07-27: qty 37

## 2015-07-27 MED ORDER — SODIUM CHLORIDE 0.9% FLUSH
10.0000 mL | INTRAVENOUS | Status: DC | PRN
  Filled 2015-07-27: qty 10

## 2015-07-27 NOTE — Patient Instructions (Signed)
1. Please take folic acid 1 mg every day. 2. Please take oral steriods: Decadron 4 mg tablet 1 pill every 12 hours for three days.   Before your next chemotherapy, you will need to take these steriods starting the day before your next chemotherapy. 3. Please take oral claritin D (over the counter) a few days prior to neulasta injection and 3-4 days after nuelasta to prevent the boney pain.

## 2015-07-27 NOTE — Telephone Encounter (Signed)
Per MD, no avastin today. HR = 110. Parameters indicate to let MD know of HR greater than 100. Message sent to RN

## 2015-07-27 NOTE — Progress Notes (Signed)
Patient brought to exam room 4 via wheelchair.  Vitals documented HR 110, Medication record updated information provided by patient and family member.  Dr. notified

## 2015-07-27 NOTE — Progress Notes (Signed)
Port a cath sutures removed. Pt tolerated well. Incision clean dry and intact.

## 2015-07-27 NOTE — Progress Notes (Signed)
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  Patient was recently admitted to the hospital for recurrent left-sided pleural effusion; found to have left lower lung mass- subsequently underwent pleurodesis with pleural biopsy. She also had a Mediport placed.  Patient states her shortness of breath is improved. No significant cough. Chest pain is stable.   She states to have some trauma of the right lower extremity noted to have swelling in the right leg throbbing. She had been in the ER- X ray was negative.   REVIEW OF SYSTEMS:  A complete 10 point review of system is done which is negative except mentioned above/history of present illness.   PAST MEDICAL HISTORY :  Past Medical History  Diagnosis Date  . Diabetes mellitus without complication ([place])   . Sleep apnea   . Hypertension   . Aneurysm ([place])   . Hiatal hernia   . COPD (chronic obstructive pulmonary disease) ([place])   . Illicit drug use   . Migraines   . Hypercholesteremia   . Depression   . Spine disorder   . Family history of chronic pain 01/17/2015  . Cancer [facility])     PAST SURGICAL HISTORY :   Past Surgical History  Procedure Laterality Date  . Abdominal hysterectomy    . Hernia repair    . Eye surgery    . Video assisted thoracoscopy (vats)/thorocotomy Left 07/14/2015    Procedure: VIDEO ASSISTED THORACOSCOPY (VATS), pleural biopsy;  Surgeon: Nestor Lewandowsky, MD;  Location: [facility] ORS;  Service:  General;  Laterality: Left;  . Portacath placement Left 07/14/2015    Procedure: INSERTION PORT-A-CATH;  Surgeon: Nestor Lewandowsky, MD;  Location: [facility] ORS;  Service: General;  Laterality: Left;  . Removal of pleural drainage catheter N/A 07/18/2015    Procedure: REMOVAL OF PLEURAL DRAINAGE CATHETER;  Surgeon: Nestor Lewandowsky, MD;  Location: [facility] ORS;  Service: Thoracic;  Laterality: N/A;    FAMILY HISTORY :   Family History  Problem Relation Age of Onset  . Heart disease Mother     SOCIAL HISTORY:   Social History  Substance Use Topics  . Smoking status: Current Every Day Smoker -- 0.50 packs/day for 45 years    Types: Cigarettes  . Smokeless tobacco: Not on file  . Alcohol Use: No    ALLERGIES:  is allergic to ampicillin and penicillins.  MEDICATIONS:  Current Outpatient Prescriptions  Medication Sig Dispense Refill  . alendronate (FOSAMAX) 70 MG tablet Take 70 mg by mouth once a week. Pt takes on Tuesday.   Take with a full glass of water on an empty stomach.    Marland Kitchen aspirin EC 81 MG tablet Take 81 mg by mouth daily.    . cetirizine (ZYRTEC) 10 MG tablet Take 10 mg by mouth at bedtime.    . DULoxetine (CYMBALTA) 30 MG capsule Take 90 mg by mouth daily.     . enalapril (VASOTEC) 10 MG tablet Take 10 mg by mouth daily.     . fluticasone (FLOVENT HFA) 220 MCG/ACT inhaler Inhale 1 puff  into the lungs 2 (two) times daily.    . folic acid (FOLVITE) 1 MG tablet Take 1 mg by mouth daily.    Marland Kitchen lidocaine-prilocaine (EMLA) cream Apply 1 application topically as needed. 30 g 6  . magnesium oxide (MAG-OX) 400 (241.3 Mg) MG tablet Take 400 mg by mouth at bedtime.    . metFORMIN (GLUCOPHAGE-XR) 750 MG 24 hr tablet Take 750 mg by mouth daily with breakfast.    . metoprolol tartrate (LOPRESSOR) 25 MG tablet Take 1 tablet (25 mg total) by mouth 2 (two) times daily. 60 tablet 0  . mirtazapine (REMERON) 45 MG tablet Take 45 mg by mouth at bedtime.    Marland Kitchen omeprazole (PRILOSEC) 20 MG capsule Take 20 mg by  mouth daily before breakfast.     . ondansetron (ZOFRAN) 8 MG tablet Take 1 tablet (8 mg total) by mouth every 8 (eight) hours as needed for nausea or vomiting (start 3 days; after chemo). 40 tablet 0  . oxyCODONE-acetaminophen (ROXICET) 5-325 MG tablet Take 1-2 tablets by mouth every 4 (four) hours as needed for severe pain. 15 tablet 0  . prochlorperazine (COMPAZINE) 10 MG tablet Take 1 tablet (10 mg total) by mouth every 6 (six) hours as needed for nausea or vomiting. 30 tablet 0  . simvastatin (ZOCOR) 20 MG tablet Take 20 mg by mouth at bedtime.     . traZODone (DESYREL) 150 MG tablet Take 150 mg by mouth at bedtime.    Marland Kitchen dexamethasone (DECADRON) 4 MG tablet One pill every 12 hour x3 days; start the day for before chemo. 30 tablet 0   No current facility-administered medications for this visit.    PHYSICAL EXAMINATION: ECOG PERFORMANCE STATUS: 1 - Symptomatic but completely ambulatory  BP 112/69 mmHg  Pulse 110  Temp(Src) 99 F (37.2 C) (Tympanic)  Wt 164 lb 3.9 oz (74.5 kg)  Filed Weights   07/27/15 0848  Weight: 164 lb 3.9 oz (74.5 kg)    GENERAL: Well-nourished well-developed; Alert, no distress and comfortable.   She is in a wheelchair because of right lower extremity swelling/pain. Accompanied by family. EYES: no pallor or icterus OROPHARYNX: no thrush or ulceration; good dentition  NECK: supple, no masses felt LYMPH:  no palpable lymphadenopathy in the cervical, axillary or inguinal regions LUNGS: Decreased breath sounds on the left side compared to the right.  No wheeze or crackles HEART/CVS: regular rate & rhythm and no murmurs; right lower extremity swelling/tenderness in the calf. ABDOMEN:abdomen soft, non-tender and normal bowel sounds Musculoskeletal:no cyanosis of digits and no clubbing; palpable swelling in the right arm 5 x 5 cm. PSYCH: alert & oriented x 3 with fluent speech NEURO: no focal motor/sensory deficits SKIN:  no rashes or significant  lesions  LABORATORY DATA:  I have reviewed the data as listed    Component Value Date/Time   NA 139 07/27/2015 0818   NA 141 02/15/2013 1152   K 4.4 07/27/2015 0818   K 4.1 02/15/2013 1152   CL 106 07/27/2015 0818   CL 115* 02/15/2013 1152   CO2 27 07/27/2015 0818   CO2 21 02/15/2013 1152   GLUCOSE 103* 07/27/2015 0818   GLUCOSE 85 02/15/2013 1152   BUN 14 07/27/2015 0818   BUN 20* 02/15/2013 1152   CREATININE 0.87 07/27/2015 0818   CREATININE 0.96 02/15/2013 1152   CALCIUM 9.1 07/27/2015 0818   CALCIUM 9.0 02/15/2013 1152   PROT 6.3* 07/27/2015 0818   PROT 7.8 07/18/2012 1025   ALBUMIN 2.8*  07/27/2015 0818   ALBUMIN 4.0 07/18/2012 1025   AST 16 07/27/2015 0818   AST 20 07/18/2012 1025   ALT 16 07/27/2015 0818   ALT 20 07/18/2012 1025   ALKPHOS 69 07/27/2015 0818   ALKPHOS 116 07/18/2012 1025   BILITOT 0.2* 07/27/2015 0818   BILITOT 0.5 07/18/2012 1025   GFRNONAA >60 07/27/2015 0818   GFRNONAA >60 02/15/2013 1152   GFRAA >60 07/27/2015 0818   GFRAA >60 02/15/2013 1152    No results found for: SPEP, UPEP  Lab Results  Component Value Date   WBC 8.6 07/27/2015   NEUTROABS 5.2 07/27/2015   HGB 10.6* 07/27/2015   HCT 31.7* 07/27/2015   MCV 82.8 07/27/2015   PLT 350 07/27/2015      Chemistry      Component Value Date/Time   NA 139 07/27/2015 0818   NA 141 02/15/2013 1152   K 4.4 07/27/2015 0818   K 4.1 02/15/2013 1152   CL 106 07/27/2015 0818   CL 115* 02/15/2013 1152   CO2 27 07/27/2015 0818   CO2 21 02/15/2013 1152   BUN 14 07/27/2015 0818   BUN 20* 02/15/2013 1152   CREATININE 0.87 07/27/2015 0818   CREATININE 0.96 02/15/2013 1152       Component Value Date/Time   CALCIUM 9.1 07/27/2015 0818   CALCIUM 9.0 02/15/2013 1152   ALKPHOS 69 07/27/2015 0818   ALKPHOS 116 07/18/2012 1025   AST 16 07/27/2015 0818   AST 20 07/18/2012 1025   ALT 16 07/27/2015 0818   ALT 20 07/18/2012 1025   BILITOT 0.2* 07/27/2015 0818   BILITOT 0.5 07/18/2012 1025         ASSESSMENT & PLAN:   # LEFT LOWER LUNG ADENOCARCINOMA- STAGE IV- Proceed with palliative chemotherapy with carboplatin and Alimta and Avastin. Hold Avastin cycle #1. I reviewed the premedication with dexamethasone; and also the importance of taking folic acid once a day. Reviewed regarding Neulasta.  Growth factor-Neulasta/On pro would be given as prophylaxis for chemotherapy-induced neutropenia to prevent febrile neutropenias. Patient is to take Claritin once a day.  # LEFT LUNG MALIGNANT EFFUSION- s/p Pleurodesis. Symptomatically seems to improve her breathing.  # PAIN CONTROL- currently stable taking oxycodone as needed.  # RIGHT LE swelling- trauma versus DVT. Recommend venous Dopplers ASAP.  All questions were answered. The patient knows to call the clinic with any problems, questions or concerns.   # Patient follow-up with me in approximately 10 days with CBC BMP; in 3 weeks for cycle #2- carboplatin-Alimta and Avastin.     Cammie Sickle, MD 07/27/2015 9:30 AM

## 2015-07-28 ENCOUNTER — Telehealth: Payer: Self-pay | Admitting: *Deleted

## 2015-07-28 MED ORDER — OXYCODONE-ACETAMINOPHEN 5-325 MG PO TABS: 1.0000 | ORAL_TABLET | Freq: Four times a day (QID) | ORAL | Status: DC | PRN

## 2015-07-28 NOTE — Telephone Encounter (Signed)
Called and said she was expecting a call from Dr Sharmaine Base nurse to give her something stronger for the pain in her ankle/ foot

## 2015-07-28 NOTE — Telephone Encounter (Signed)
Per Dr Rogue Bussing Oxycodone APAP 5/325 1 every 6 -8 h PRN # 30 tabs Informed that prescription is ready to pick up

## 2015-07-29 ENCOUNTER — Inpatient Hospital Stay: Payer: Medicare Other

## 2015-07-29 ENCOUNTER — Encounter: Payer: Self-pay | Admitting: Emergency Medicine

## 2015-07-29 ENCOUNTER — Emergency Department: Payer: Medicare Other

## 2015-07-29 ENCOUNTER — Other Ambulatory Visit: Payer: Self-pay

## 2015-07-29 ENCOUNTER — Inpatient Hospital Stay
Admission: EM | Admit: 2015-07-29 | Discharge: 2015-07-30 | DRG: 176 | Disposition: A | Discharge status: Home / Self Care | Payer: Medicare Other | Attending: Specialist | Admitting: Specialist

## 2015-07-29 DIAGNOSIS — I10 Essential (primary) hypertension: Secondary | ICD-10-CM | POA: Diagnosis present

## 2015-07-29 DIAGNOSIS — T380X5A Adverse effect of glucocorticoids and synthetic analogues, initial encounter: Secondary | ICD-10-CM | POA: Diagnosis present

## 2015-07-29 DIAGNOSIS — Z9981 Dependence on supplemental oxygen: Secondary | ICD-10-CM

## 2015-07-29 DIAGNOSIS — C3432 Malignant neoplasm of lower lobe, left bronchus or lung: Secondary | ICD-10-CM | POA: Diagnosis present

## 2015-07-29 DIAGNOSIS — F419 Anxiety disorder, unspecified: Secondary | ICD-10-CM | POA: Diagnosis present

## 2015-07-29 DIAGNOSIS — E559 Vitamin D deficiency, unspecified: Secondary | ICD-10-CM | POA: Diagnosis present

## 2015-07-29 DIAGNOSIS — E119 Type 2 diabetes mellitus without complications: Secondary | ICD-10-CM

## 2015-07-29 DIAGNOSIS — F329 Major depressive disorder, single episode, unspecified: Secondary | ICD-10-CM | POA: Diagnosis present

## 2015-07-29 DIAGNOSIS — G4730 Sleep apnea, unspecified: Secondary | ICD-10-CM | POA: Diagnosis present

## 2015-07-29 DIAGNOSIS — E7800 Pure hypercholesterolemia, unspecified: Secondary | ICD-10-CM | POA: Diagnosis present

## 2015-07-29 DIAGNOSIS — J449 Chronic obstructive pulmonary disease, unspecified: Secondary | ICD-10-CM | POA: Diagnosis present

## 2015-07-29 DIAGNOSIS — Z79899 Other long term (current) drug therapy: Secondary | ICD-10-CM | POA: Diagnosis not present

## 2015-07-29 DIAGNOSIS — I2699 Other pulmonary embolism without acute cor pulmonale: Secondary | ICD-10-CM | POA: Diagnosis present

## 2015-07-29 DIAGNOSIS — Z880 Allergy status to penicillin: Secondary | ICD-10-CM

## 2015-07-29 DIAGNOSIS — T458X5A Adverse effect of other primarily systemic and hematological agents, initial encounter: Secondary | ICD-10-CM | POA: Diagnosis present

## 2015-07-29 DIAGNOSIS — Z7984 Long term (current) use of oral hypoglycemic drugs: Secondary | ICD-10-CM

## 2015-07-29 DIAGNOSIS — D72829 Elevated white blood cell count, unspecified: Secondary | ICD-10-CM | POA: Diagnosis present

## 2015-07-29 DIAGNOSIS — R51 Headache: Secondary | ICD-10-CM | POA: Diagnosis present

## 2015-07-29 DIAGNOSIS — G894 Chronic pain syndrome: Secondary | ICD-10-CM | POA: Diagnosis present

## 2015-07-29 DIAGNOSIS — G43909 Migraine, unspecified, not intractable, without status migrainosus: Secondary | ICD-10-CM | POA: Diagnosis present

## 2015-07-29 DIAGNOSIS — C3492 Malignant neoplasm of unspecified part of left bronchus or lung: Secondary | ICD-10-CM

## 2015-07-29 DIAGNOSIS — R040 Epistaxis: Secondary | ICD-10-CM

## 2015-07-29 DIAGNOSIS — Z7982 Long term (current) use of aspirin: Secondary | ICD-10-CM | POA: Diagnosis not present

## 2015-07-29 DIAGNOSIS — R079 Chest pain, unspecified: Secondary | ICD-10-CM

## 2015-07-29 DIAGNOSIS — M549 Dorsalgia, unspecified: Secondary | ICD-10-CM | POA: Diagnosis present

## 2015-07-29 DIAGNOSIS — R609 Edema, unspecified: Secondary | ICD-10-CM

## 2015-07-29 DIAGNOSIS — Z87891 Personal history of nicotine dependence: Secondary | ICD-10-CM

## 2015-07-29 DIAGNOSIS — Z79891 Long term (current) use of opiate analgesic: Secondary | ICD-10-CM

## 2015-07-29 DIAGNOSIS — G40909 Epilepsy, unspecified, not intractable, without status epilepticus: Secondary | ICD-10-CM | POA: Diagnosis present

## 2015-07-29 DIAGNOSIS — R519 Headache, unspecified: Secondary | ICD-10-CM

## 2015-07-29 HISTORY — DX: Malignant neoplasm of unspecified part of unspecified bronchus or lung: C34.90

## 2015-07-29 LAB — COMPREHENSIVE METABOLIC PANEL
ALK PHOS: 81 U/L (ref 38–126)
ALT: 23 U/L (ref 14–54)
ANION GAP: 10 (ref 5–15)
AST: 27 U/L (ref 15–41)
Albumin: 2.9 g/dL — ABNORMAL LOW (ref 3.5–5.0)
BILIRUBIN TOTAL: 0.5 mg/dL (ref 0.3–1.2)
BUN: 11 mg/dL (ref 6–20)
CALCIUM: 9.3 mg/dL (ref 8.9–10.3)
CO2: 24 mmol/L (ref 22–32)
CREATININE: 0.82 mg/dL (ref 0.44–1.00)
Chloride: 105 mmol/L (ref 101–111)
Glucose, Bld: 111 mg/dL — ABNORMAL HIGH (ref 65–99)
Potassium: 4.2 mmol/L (ref 3.5–5.1)
SODIUM: 139 mmol/L (ref 135–145)
TOTAL PROTEIN: 6.6 g/dL (ref 6.5–8.1)

## 2015-07-29 LAB — CBC WITH DIFFERENTIAL/PLATELET
BASOS ABS: 0.1 10*3/uL (ref 0–0.1)
Basophils Relative: 0 %
EOS ABS: 0.1 10*3/uL (ref 0–0.7)
HCT: 32.8 % — ABNORMAL LOW (ref 35.0–47.0)
Hemoglobin: 10.7 g/dL — ABNORMAL LOW (ref 12.0–16.0)
Lymphs Abs: 2.6 10*3/uL (ref 1.0–3.6)
MCH: 27.2 pg (ref 26.0–34.0)
MCHC: 32.7 g/dL (ref 32.0–36.0)
MCV: 83.2 fL (ref 80.0–100.0)
MONO ABS: 0.4 10*3/uL (ref 0.2–0.9)
Monocytes Relative: 1 %
Neutro Abs: 29.8 10*3/uL — ABNORMAL HIGH (ref 1.4–6.5)
Neutrophils Relative %: 91 %
PLATELETS: 302 10*3/uL (ref 150–440)
RBC: 3.94 MIL/uL (ref 3.80–5.20)
RDW: 15 % — AB (ref 11.5–14.5)
WBC: 33 10*3/uL — AB (ref 3.6–11.0)

## 2015-07-29 LAB — LIPASE, BLOOD: Lipase: 22 U/L (ref 11–51)

## 2015-07-29 LAB — TROPONIN I

## 2015-07-29 LAB — GLUCOSE, CAPILLARY
GLUCOSE-CAPILLARY: 100 mg/dL — AB (ref 65–99)
Glucose-Capillary: 90 mg/dL (ref 65–99)

## 2015-07-29 LAB — APTT: APTT: 34 s (ref 24–36)

## 2015-07-29 LAB — PROTIME-INR
INR: 1.09
PROTHROMBIN TIME: 14.3 s (ref 11.4–15.0)

## 2015-07-29 LAB — HEPARIN LEVEL (UNFRACTIONATED): HEPARIN UNFRACTIONATED: 0.77 [IU]/mL — AB (ref 0.30–0.70)

## 2015-07-29 MED ORDER — HEPARIN BOLUS VIA INFUSION
3500.0000 [IU] | Freq: Once | INTRAVENOUS | Status: AC
  Administered 2015-07-29: 3500 [IU] via INTRAVENOUS
  Filled 2015-07-29: qty 3500

## 2015-07-29 MED ORDER — INSULIN ASPART 100 UNIT/ML ~~LOC~~ SOLN: 0.0000 [IU] | Freq: Every day | SUBCUTANEOUS | Status: DC

## 2015-07-29 MED ORDER — SODIUM CHLORIDE 0.9 % IV SOLN
INTRAVENOUS | Status: DC
  Administered 2015-07-29 – 2015-07-30 (×2): via INTRAVENOUS

## 2015-07-29 MED ORDER — KETOROLAC TROMETHAMINE 30 MG/ML IJ SOLN
30.0000 mg | Freq: Once | INTRAMUSCULAR | Status: AC
  Administered 2015-07-29: 30 mg via INTRAVENOUS
  Filled 2015-07-29: qty 1

## 2015-07-29 MED ORDER — ACETAMINOPHEN 325 MG PO TABS
650.0000 mg | ORAL_TABLET | Freq: Four times a day (QID) | ORAL | Status: DC | PRN
  Administered 2015-07-29: 650 mg via ORAL
  Filled 2015-07-29: qty 2

## 2015-07-29 MED ORDER — OXYCODONE-ACETAMINOPHEN 5-325 MG PO TABS
1.0000 | ORAL_TABLET | Freq: Four times a day (QID) | ORAL | Status: DC | PRN
  Administered 2015-07-29 – 2015-07-30 (×3): 1 via ORAL
  Filled 2015-07-29 (×4): qty 1

## 2015-07-29 MED ORDER — HEPARIN (PORCINE) IN NACL 100-0.45 UNIT/ML-% IJ SOLN
1100.0000 [IU]/h | INTRAMUSCULAR | Status: DC
  Administered 2015-07-29: 18:00:00 1150 [IU]/h via INTRAVENOUS
  Filled 2015-07-29 (×2): qty 250

## 2015-07-29 MED ORDER — METOCLOPRAMIDE HCL 5 MG/ML IJ SOLN
10.0000 mg | Freq: Once | INTRAMUSCULAR | Status: AC
  Administered 2015-07-29: 10 mg via INTRAVENOUS
  Filled 2015-07-29: qty 2

## 2015-07-29 MED ORDER — METFORMIN HCL ER 750 MG PO TB24
750.0000 mg | ORAL_TABLET | Freq: Every day | ORAL | Status: DC
  Administered 2015-07-30: 750 mg via ORAL
  Filled 2015-07-29 (×2): qty 1

## 2015-07-29 MED ORDER — METOPROLOL TARTRATE 25 MG PO TABS
25.0000 mg | ORAL_TABLET | Freq: Two times a day (BID) | ORAL | Status: DC
  Administered 2015-07-29 – 2015-07-30 (×2): 25 mg via ORAL
  Filled 2015-07-29 (×2): qty 1

## 2015-07-29 MED ORDER — ACETAMINOPHEN 650 MG RE SUPP: 650.0000 mg | Freq: Four times a day (QID) | RECTAL | Status: DC | PRN

## 2015-07-29 MED ORDER — MIRTAZAPINE 15 MG PO TABS
45.0000 mg | ORAL_TABLET | Freq: Every day | ORAL | Status: DC
  Administered 2015-07-29: 22:00:00 45 mg via ORAL
  Filled 2015-07-29: qty 3

## 2015-07-29 MED ORDER — SIMVASTATIN 20 MG PO TABS
20.0000 mg | ORAL_TABLET | Freq: Every day | ORAL | Status: DC
  Administered 2015-07-29: 21:00:00 20 mg via ORAL
  Filled 2015-07-29: qty 1

## 2015-07-29 MED ORDER — FLUTICASONE PROPIONATE HFA 220 MCG/ACT IN AERO: 1.0000 | INHALATION_SPRAY | Freq: Two times a day (BID) | RESPIRATORY_TRACT | Status: DC

## 2015-07-29 MED ORDER — MAGNESIUM OXIDE 400 (241.3 MG) MG PO TABS
400.0000 mg | ORAL_TABLET | Freq: Every day | ORAL | Status: DC
  Administered 2015-07-29: 21:00:00 400 mg via ORAL
  Filled 2015-07-29: qty 1

## 2015-07-29 MED ORDER — ONDANSETRON HCL 4 MG PO TABS
4.0000 mg | ORAL_TABLET | Freq: Four times a day (QID) | ORAL | Status: DC | PRN
Start: 2015-07-29 — End: 2015-07-30

## 2015-07-29 MED ORDER — DULOXETINE HCL 60 MG PO CPEP
90.0000 mg | ORAL_CAPSULE | Freq: Every day | ORAL | Status: DC
  Administered 2015-07-30: 09:00:00 90 mg via ORAL
  Filled 2015-07-29: qty 1

## 2015-07-29 MED ORDER — ONDANSETRON HCL 4 MG/2ML IJ SOLN
4.0000 mg | Freq: Four times a day (QID) | INTRAMUSCULAR | Status: DC | PRN
  Administered 2015-07-29 – 2015-07-30 (×3): 4 mg via INTRAVENOUS
  Filled 2015-07-29 (×4): qty 2

## 2015-07-29 MED ORDER — FOLIC ACID 1 MG PO TABS
1.0000 mg | ORAL_TABLET | Freq: Every day | ORAL | Status: DC
Start: 2015-07-29 — End: 2015-07-30
  Administered 2015-07-30: 09:00:00 1 mg via ORAL
  Filled 2015-07-29: qty 1

## 2015-07-29 MED ORDER — DIPHENHYDRAMINE HCL 50 MG/ML IJ SOLN
25.0000 mg | Freq: Once | INTRAMUSCULAR | Status: AC
  Administered 2015-07-29: 14:00:00 via INTRAVENOUS
  Filled 2015-07-29: qty 1

## 2015-07-29 MED ORDER — PROCHLORPERAZINE MALEATE 10 MG PO TABS: 10.0000 mg | ORAL_TABLET | Freq: Four times a day (QID) | ORAL | Status: DC | PRN

## 2015-07-29 MED ORDER — LIDOCAINE-PRILOCAINE 2.5-2.5 % EX CREA
1.0000 "application " | TOPICAL_CREAM | CUTANEOUS | Status: DC | PRN
  Filled 2015-07-29: qty 5

## 2015-07-29 MED ORDER — PANTOPRAZOLE SODIUM 40 MG PO TBEC
40.0000 mg | DELAYED_RELEASE_TABLET | Freq: Every day | ORAL | Status: DC
  Administered 2015-07-30: 09:00:00 40 mg via ORAL
  Filled 2015-07-29: qty 1

## 2015-07-29 MED ORDER — ENALAPRIL MALEATE 5 MG PO TABS
10.0000 mg | ORAL_TABLET | Freq: Every day | ORAL | Status: DC
  Administered 2015-07-30: 10 mg via ORAL
  Filled 2015-07-29: qty 2

## 2015-07-29 MED ORDER — SODIUM CHLORIDE 0.9 % IV BOLUS (SEPSIS)
1000.0000 mL | Freq: Once | INTRAVENOUS | Status: AC
  Administered 2015-07-29: 1000 mL via INTRAVENOUS

## 2015-07-29 MED ORDER — TRAZODONE HCL 50 MG PO TABS
150.0000 mg | ORAL_TABLET | Freq: Every day | ORAL | Status: DC
  Administered 2015-07-29: 150 mg via ORAL
  Filled 2015-07-29: qty 1

## 2015-07-29 MED ORDER — INSULIN ASPART 100 UNIT/ML ~~LOC~~ SOLN: 0.0000 [IU] | Freq: Three times a day (TID) | SUBCUTANEOUS | Status: DC

## 2015-07-29 MED ORDER — LORATADINE 10 MG PO TABS
10.0000 mg | ORAL_TABLET | Freq: Every day | ORAL | Status: DC
  Administered 2015-07-30: 10 mg via ORAL
  Filled 2015-07-29: qty 1

## 2015-07-29 MED ORDER — IOPAMIDOL (ISOVUE-370) INJECTION 76%
75.0000 mL | Freq: Once | INTRAVENOUS | Status: AC | PRN
  Administered 2015-07-29: 75 mL via INTRAVENOUS

## 2015-07-29 MED ORDER — ALENDRONATE SODIUM 70 MG PO TABS: 70.0000 mg | ORAL_TABLET | ORAL | Status: DC

## 2015-07-29 MED ORDER — BUDESONIDE 0.5 MG/2ML IN SUSP
0.5000 mg | Freq: Two times a day (BID) | RESPIRATORY_TRACT | Status: DC
Start: 2015-07-29 — End: 2015-07-30
  Administered 2015-07-29 – 2015-07-30 (×2): 0.5 mg via RESPIRATORY_TRACT
  Filled 2015-07-29 (×2): qty 2

## 2015-07-29 NOTE — ED Provider Notes (Addendum)
[facility] Emergency Department Provider Note  ____________________________________________  Time seen: 2:00 PM  I have reviewed the triage vital signs and the nursing notes.   HISTORY  Chief Complaint Epistaxis and Back Pain    HPI Charlene Williams is a 60 y.o. female reports she felt like her blood pressure was going up last night was she was asleep. She woke up and noticed that she had a slight headache and that her nose was starting to bleed. The nosebleed stopped spontaneously but had her worried. Headache is persisting, bilateral frontal, mild without associated vomiting numbness tingling weakness or vision changes. Patient has lung cancer and recently had chest tubes in her chest which were removed. She has aching constant pain in the chest on both sides for the past 3 or 4 days which is worse with breathing since this tubes were removed. She also recently had an ultrasound of her right lower extremity to evaluate for DVT due to persistent swelling, which she reports was negative.     Past Medical History  Diagnosis Date  . Diabetes mellitus without complication ([place])   . Sleep apnea   . Hypertension   . Aneurysm ([place])   . Hiatal hernia   . COPD (chronic obstructive pulmonary disease) ([place])   . Illicit drug use   . Migraines   . Hypercholesteremia   . Depression   . Spine disorder   . Family history of chronic pain 01/17/2015  . Cancer [facility])      Patient Active Problem List   Diagnosis Date Noted  . Primary cancer of left lower lobe of lung ([place]) 07/19/2015  . Primary cancer of left lung metastatic to other site ([place]) 07/19/2015  . Pleural effusion   . Recurrent left pleural effusion   . COPD exacerbation ([place]) 07/11/2015  . SIRS (systemic inflammatory response syndrome) ([place]) 06/25/2015  . Pleural effusion, left 06/25/2015  . Left lower lobe pneumonia 06/25/2015  . Hypoxemia 06/25/2015  . Hyponatremia 06/25/2015  . Elevated troponin  06/25/2015  . Generalized weakness 06/25/2015  . Chronic lower extremity pain (Bilateral) 05/19/2015  . Substance use disorder (SUB Risk: Very high) 05/19/2015  . Myofascial pain 05/15/2015  . Muscle spasms of lower extremity 05/15/2015  . Chronic sacroiliac joint pain (Location of Primary Source of Pain) (Bilateral) (R>L) 05/15/2015  . Neurogenic pain 05/15/2015  . Avitaminosis D 05/15/2015  . Chronic pain syndrome 01/17/2015  . Chronic pain 01/17/2015  . Lumbar facet syndrome (Location of Primary Source of Pain) (Bilateral) (R>L) 01/17/2015  . Chronic low back pain (Location of Primary Source of Pain) (Bilateral) (R>L) 01/17/2015  . Chronic radicular lumbar pain (Bilateral) 01/17/2015  . Osteoarthritis of spine with radiculopathy, lumbar region 01/17/2015  . Polysubstance abuse 01/17/2015  . Lumbar spondylosis 01/17/2015  . Lumbar facet arthropathy (Bilateral) 01/17/2015  . Magnesium deficiency 01/17/2015  . Lumbar spinal stenosis (Central Spinal Stenosis) (Severe) (L3-4 and L4-5) 01/17/2015  . Family history of chronic pain 01/17/2015  . Nicotine dependence 01/17/2015  . Smoker 01/17/2015  . Tobacco abuse 01/17/2015  . Essential hypertension, benign 01/17/2015  . Chronic obstructive pulmonary disease (COPD) ([place]) 01/17/2015  . Emphysema of lung ([place]) 01/17/2015  . History of bronchitis 01/17/2015  . History of shortness of breath 01/17/2015  . Generalized anxiety disorder 01/17/2015  . Seizure disorder ([place]) 01/17/2015  . Depression 01/17/2015  . History of panic attacks 01/17/2015  . Non-insulin dependent type 2 diabetes mellitus ([place]) 01/17/2015     Past Surgical History  Procedure Laterality Date  . Abdominal hysterectomy    . Hernia repair    . Eye surgery    . Video assisted thoracoscopy (vats)/thorocotomy Left 07/14/2015    Procedure: VIDEO ASSISTED THORACOSCOPY (VATS), pleural biopsy;  Surgeon: Nestor Lewandowsky, MD;  Location: [facility] ORS;  Service: General;  Laterality:  Left;  . Portacath placement Left 07/14/2015    Procedure: INSERTION PORT-A-CATH;  Surgeon: Nestor Lewandowsky, MD;  Location: [facility] ORS;  Service: General;  Laterality: Left;  . Removal of pleural drainage catheter N/A 07/18/2015    Procedure: REMOVAL OF PLEURAL DRAINAGE CATHETER;  Surgeon: Nestor Lewandowsky, MD;  Location: [facility] ORS;  Service: Thoracic;  Laterality: N/A;     Current Outpatient Rx  Name  Route  Sig  Dispense  Refill  . alendronate (FOSAMAX) 70 MG tablet   Oral   Take 70 mg by mouth once a week. Pt takes on Tuesday.   Take with a full glass of water on an empty stomach.         Marland Kitchen aspirin EC 81 MG tablet   Oral   Take 81 mg by mouth daily.         . cetirizine (ZYRTEC) 10 MG tablet   Oral   Take 10 mg by mouth at bedtime.         Marland Kitchen dexamethasone (DECADRON) 4 MG tablet      One pill every 12 hour x3 days; start the day for before chemo.   30 tablet   0   . DULoxetine (CYMBALTA) 30 MG capsule   Oral   Take 90 mg by mouth daily.          . enalapril (VASOTEC) 10 MG tablet   Oral   Take 10 mg by mouth daily.          . fluticasone (FLOVENT HFA) 220 MCG/ACT inhaler   Inhalation   Inhale 1 puff into the lungs 2 (two) times daily.         . folic acid (FOLVITE) 1 MG tablet   Oral   Take 1 mg by mouth daily.         Marland Kitchen lidocaine-prilocaine (EMLA) cream   Topical   Apply 1 application topically as needed.   30 g   6   . loratadine (CLARITIN) 10 MG tablet   Oral   Take 10 mg by mouth daily.         . magnesium oxide (MAG-OX) 400 (241.3 Mg) MG tablet   Oral   Take 400 mg by mouth at bedtime.         . metFORMIN (GLUCOPHAGE-XR) 750 MG 24 hr tablet   Oral   Take 750 mg by mouth daily with breakfast.         . metoprolol tartrate (LOPRESSOR) 25 MG tablet   Oral   Take 1 tablet (25 mg total) by mouth 2 (two) times daily.   60 tablet   0   . mirtazapine (REMERON) 45 MG tablet   Oral   Take 45 mg by mouth at bedtime.         Marland Kitchen omeprazole  (PRILOSEC) 20 MG capsule   Oral   Take 20 mg by mouth daily before breakfast.          . ondansetron (ZOFRAN) 8 MG tablet   Oral   Take 1 tablet (8 mg total) by mouth every 8 (eight) hours as needed for nausea or vomiting (start 3 days; after chemo).  40 tablet   0   . oxyCODONE-acetaminophen (ROXICET) 5-325 MG tablet   Oral   Take 1 tablet by mouth every 6 (six) hours as needed for severe pain.   30 tablet   0   . prochlorperazine (COMPAZINE) 10 MG tablet   Oral   Take 1 tablet (10 mg total) by mouth every 6 (six) hours as needed for nausea or vomiting.   30 tablet   0   . simvastatin (ZOCOR) 20 MG tablet   Oral   Take 20 mg by mouth at bedtime.          . traZODone (DESYREL) 150 MG tablet   Oral   Take 150 mg by mouth at bedtime.            Allergies Ampicillin and Penicillins   Family History  Problem Relation Age of Onset  . Heart disease Mother     Social History Social History  Substance Use Topics  . Smoking status: Former Smoker -- 0.00 packs/day for 45 years    Quit date: 04/29/2015  . Smokeless tobacco: None  . Alcohol Use: No    Review of Systems  Constitutional:   No fever or chills.  Eyes:   No vision changes.  ENT:   No sore throat. No rhinorrhea.Positive epistaxis, spontaneously resolved prior to arrival in the ED. Cardiovascular:   Positive as above chest pain. Respiratory:   No dyspnea or cough. Gastrointestinal:   Negative for abdominal pain, vomiting and diarrhea.  Genitourinary:   Negative for dysuria or difficulty urinating. Musculoskeletal:   Negative for focal pain or swelling Neurological:   Positive as above for headache 10-point ROS otherwise negative.  ____________________________________________   PHYSICAL EXAM:  VITAL SIGNS: ED Triage Vitals  Enc Vitals Group     BP 07/29/15 1328 164/88 mmHg     Pulse Rate 07/29/15 1328 107     Resp 07/29/15 1328 18     Temp 07/29/15 1328 100.1 F (37.8 C)     Temp Source  07/29/15 1328 Oral     SpO2 07/29/15 1328 100 %     Weight 07/29/15 1328 162 lb (73.483 kg)     Height 07/29/15 1328 '5\' 3"'$  (1.6 m)     Head Cir --      Peak Flow --      Pain Score 07/29/15 1329 10     Pain Loc --      Pain Edu? --      Excl. in GC? --   Oxygen saturation 88 % on room air  Vital signs reviewed, nursing assessments reviewed.   Constitutional:   Alert and oriented. Well appearing and in no distress. Eyes:   No scleral icterus. No conjunctival pallor. PERRL. EOMI.  No nystagmus. ENT   Head:   Normocephalic and atraumatic.   Nose:   No congestion/rhinnorhea. No septal hematoma   Mouth/Throat:   MMM, no pharyngeal erythema. No peritonsillar mass.    Neck:   No stridor. No SubQ emphysema. No meningismus. Hematological/Lymphatic/Immunilogical:   No cervical lymphadenopathy. Cardiovascular:   Tachycardia heart rate 105. Symmetric bilateral radial and DP pulses.  No murmurs.  Respiratory:   Normal respiratory effort without tachypnea nor retractions. Breath sounds are clear and equal bilaterally. No wheezes/rales/rhonchi.No inducible wheezing or cough with forceful expiration Gastrointestinal:   Soft and nontender. Non distended. There is no CVA tenderness.  No rebound, rigidity, or guarding. Genitourinary:   deferred Musculoskeletal:   Nontender with normal range of  motion in all extremities. No joint effusions.  Right calf tenderness. Slight right leg swelling compared to left. No erythema. Neurologic:   Normal speech and language.  CN 2-10 normal. Motor grossly intact. No gross focal neurologic deficits are appreciated.  Skin:    Skin is warm, dry and intact. No rash noted.  No petechiae, purpura, or bullae.  ____________________________________________    LABS (pertinent positives/negatives) (all labs ordered are listed, but only abnormal results are displayed) Labs Reviewed  COMPREHENSIVE METABOLIC PANEL - Abnormal; Notable for the following:     Glucose, Bld 111 (*)    Albumin 2.9 (*)    All other components within normal limits  LIPASE, BLOOD  TROPONIN I  CBC WITH DIFFERENTIAL/PLATELET   ____________________________________________   EKG  Interpreted by me  Date: 07/29/2015  Rate: 86  Rhythm: normal sinus rhythm  QRS Axis: normal  Intervals: normal  ST/T Wave abnormalities: normal  Conduction Disutrbances: none  Narrative Interpretation: unremarkable      ____________________________________________    RADIOLOGY  CT head unremarkable CT angiogram chest Reveals multiple PEs in the right lung. Discussed with radiology.  ____________________________________________   PROCEDURES  CRITICAL CARE Performed by: Joni Fears, Tenna Lacko   Total critical care time: 35 minutes  Critical care time was exclusive of separately billable procedures and treating other patients.  Critical care was necessary to treat or prevent imminent or life-threatening deterioration.  Critical care was time spent personally by me on the following activities: development of treatment plan with patient and/or surrogate as well as nursing, discussions with consultants, evaluation of patient's response to treatment, examination of patient, obtaining history from patient or surrogate, ordering and performing treatments and interventions, ordering and review of laboratory studies, ordering and review of radiographic studies, pulse oximetry and re-evaluation of patient's condition.  ____________________________________________   INITIAL IMPRESSION / ASSESSMENT AND PLAN / ED COURSE  Pertinent labs & imaging results that were available during my care of the patient were reviewed by me and considered in my medical decision making (see chart for details).  Patient well appearing no acute distress, presents for evaluation of chest pain and headache. Low suspicion for a vascular injury such as carotid dissection or vertebral dissection or occlusion.  With her lung cancer history, previous prior interventions, and pleuritic pain there is concern for PE. We will proceed with CT of the chest while checking labs. No current epistaxis, does not appear to be coagulopathic based on absence of bruises on the skin. I don't think this headache is concerning for anything acute,Considering the patient's symptoms, medical history, and physical examination today, I have low suspicion for ischemic stroke, intracranial hemorrhage, meningitis, encephalitis, carotid or vertebral dissection, venous sinus thrombosis, MS, intracranial hypertension, glaucoma, CRAO, CRVO, or temporal arteritis.    ----------------------------------------- 3:36 PM on 07/29/2015 -----------------------------------------  Discussed with radiologist said 3:30 PM, positive for multiple PEs in the right side. With symptoms, and acute respiratory failure with hypoxia requiring nasal cannula, we'll start her on IV heparin and plan for admission. Hospitalization is also helpful given her history of epistaxis. Although there is no evidence of recurrent or active bleeding at this time, monitoring for any adverse effects from anticoagulation will be prudent.    ____________________________________________   FINAL CLINICAL IMPRESSION(S) / ED DIAGNOSES  Final diagnoses:  Acute nonintractable headache, unspecified headache type  Epistaxis  Nonspecific chest pain       Portions of this note were generated with dragon dictation software. Dictation errors may occur despite best attempts  at proofreading.   Carrie Mew, MD 07/29/15 [place], MD 07/29/15 [phone]

## 2015-07-29 NOTE — Discharge Instructions (Signed)
General Headache Without Cause °A headache is pain or discomfort felt around the head or neck area. The specific cause of a headache may not be found. There are many causes and types of headaches. A few common ones are: °· Tension headaches. °· Migraine headaches. °· Cluster headaches. °· Chronic daily headaches. °HOME CARE INSTRUCTIONS  °Watch your condition for any changes. Take these steps to help with your condition: °Managing Pain °· Take over-the-counter and prescription medicines only as told by your health care provider. °· Lie down in a dark, quiet room when you have a headache. °· If directed, apply ice to the head and neck area: °· Put ice in a plastic bag. °· Place a towel between your skin and the bag. °· Leave the ice on for 20 minutes, 2-3 times per day. °· Use a heating pad or hot shower to apply heat to the head and neck area as told by your health care provider. °· Keep lights dim if bright lights bother you or make your headaches worse. °Eating and Drinking °· Eat meals on a regular schedule. °· Limit alcohol use. °· Decrease the amount of caffeine you drink, or stop drinking caffeine. °General Instructions °· Keep all follow-up visits as told by your health care provider. This is important. °· Keep a headache journal to help find out what may trigger your headaches. For example, write down: °· What you eat and drink. °· How much sleep you get. °· Any change to your diet or medicines. °· Try massage or other relaxation techniques. °· Limit stress. °· Sit up straight, and do not tense your muscles. °· Do not use tobacco products, including cigarettes, chewing tobacco, or e-cigarettes. If you need help quitting, ask your health care provider. °· Exercise regularly as told by your health care provider. °· Sleep on a regular schedule. Get 7-9 hours of sleep, or the amount recommended by your health care provider. °SEEK MEDICAL CARE IF:  °· Your symptoms are not helped by medicine. °· You have a  headache that is different from the usual headache. °· You have nausea or you vomit. °· You have a fever. °SEEK IMMEDIATE MEDICAL CARE IF:  °· Your headache becomes severe. °· You have repeated vomiting. °· You have a stiff neck. °· You have a loss of vision. °· You have problems with speech. °· You have pain in the eye or ear. °· You have muscular weakness or loss of muscle control. °· You lose your balance or have trouble walking. °· You feel faint or pass out. °· You have confusion. °  °This information is not intended to replace advice given to you by your health care provider. Make sure you discuss any questions you have with your health care provider. °  °Document Released: 03/11/2005 Document Revised: 11/30/2014 Document Reviewed: 07/04/2014 °[employer] Interactive Patient Education ©2016 [employer] ° °Nonspecific Chest Pain  °Chest pain can be caused by many different conditions. There is always a chance that your pain could be related to something serious, such as a heart attack or a blood clot in your lungs. Chest pain can also be caused by conditions that are not life-threatening. If you have chest pain, it is very important to follow up with your health care provider. °CAUSES  °Chest pain can be caused by: °· Heartburn. °· Pneumonia or bronchitis. °· Anxiety or stress. °· Inflammation around your heart (pericarditis) or lung (pleuritis or pleurisy). °· A blood clot in your lung. °· A collapsed   lung (pneumothorax). It can develop suddenly on its own (spontaneous pneumothorax) or from trauma to the chest. °· Shingles infection (varicella-zoster virus). °· Heart attack. °· Damage to the bones, muscles, and cartilage that make up your chest wall. This can include: °¨ Bruised bones due to injury. °¨ Strained muscles or cartilage due to frequent or repeated coughing or overwork. °¨ Fracture to one or more ribs. °¨ Sore cartilage due to inflammation (costochondritis). °RISK FACTORS  °Risk factors for chest  pain may include: °· Activities that increase your risk for trauma or injury to your chest. °· Respiratory infections or conditions that cause frequent coughing. °· Medical conditions or overeating that can cause heartburn. °· Heart disease or family history of heart disease. °· Conditions or health behaviors that increase your risk of developing a blood clot. °· Having had chicken pox (varicella zoster). °SIGNS AND SYMPTOMS °Chest pain can feel like: °· Burning or tingling on the surface of your chest or deep in your chest. °· Crushing, pressure, aching, or squeezing pain. °· Dull or sharp pain that is worse when you move, cough, or take a deep breath. °· Pain that is also felt in your back, neck, shoulder, or arm, or pain that spreads to any of these areas. °Your chest pain may come and go, or it may stay constant. °DIAGNOSIS °Lab tests or other studies may be needed to find the cause of your pain. Your health care provider may have you take a test called an ambulatory ECG (electrocardiogram). An ECG records your heartbeat patterns at the time the test is performed. You may also have other tests, such as: °· Transthoracic echocardiogram (TTE). During echocardiography, sound waves are used to create a picture of all of the heart structures and to look at how blood flows through your heart. °· Transesophageal echocardiogram (TEE). This is a more advanced imaging test that obtains images from inside your body. It allows your health care provider to see your heart in finer detail. °· Cardiac monitoring. This allows your health care provider to monitor your heart rate and rhythm in real time. °· Holter monitor. This is a portable device that records your heartbeat and can help to diagnose abnormal heartbeats. It allows your health care provider to track your heart activity for several days, if needed. °· Stress tests. These can be done through exercise or by taking medicine that makes your heart beat more  quickly. °· Blood tests. °· Imaging tests. °TREATMENT  °Your treatment depends on what is causing your chest pain. Treatment may include: °· Medicines. These may include: °¨ Acid blockers for heartburn. °¨ Anti-inflammatory medicine. °¨ Pain medicine for inflammatory conditions. °¨ Antibiotic medicine, if an infection is present. °¨ Medicines to dissolve blood clots. °¨ Medicines to treat coronary artery disease. °· Supportive care for conditions that do not require medicines. This may include: °¨ Resting. °¨ Applying heat or cold packs to injured areas. °¨ Limiting activities until pain decreases. °HOME CARE INSTRUCTIONS °· If you were prescribed an antibiotic medicine, finish it all even if you start to feel better. °· Avoid any activities that bring on chest pain. °· Do not use any tobacco products, including cigarettes, chewing tobacco, or electronic cigarettes. If you need help quitting, ask your health care provider. °· Do not drink alcohol. °· Take medicines only as directed by your health care provider. °· Keep all follow-up visits as directed by your health care provider. This is important. This includes any further testing if your chest pain   does not go away. °· If heartburn is the cause for your chest pain, you may be told to keep your head raised (elevated) while sleeping. This reduces the chance that acid will go from your stomach into your esophagus. °· Make lifestyle changes as directed by your health care provider. These may include: °¨ Getting regular exercise. Ask your health care provider to suggest some activities that are safe for you. °¨ Eating a heart-healthy diet. A registered dietitian can help you to learn healthy eating options. °¨ Maintaining a healthy weight. °¨ Managing diabetes, if necessary. °¨ Reducing stress. °SEEK MEDICAL CARE IF: °· Your chest pain does not go away after treatment. °· You have a rash with blisters on your chest. °· You have a fever. °SEEK IMMEDIATE MEDICAL CARE  IF:  °· Your chest pain is worse. °· You have an increasing cough, or you cough up blood. °· You have severe abdominal pain. °· You have severe weakness. °· You faint. °· You have chills. °· You have sudden, unexplained chest discomfort. °· You have sudden, unexplained discomfort in your arms, back, neck, or jaw. °· You have shortness of breath at any time. °· You suddenly start to sweat, or your skin gets clammy. °· You feel nauseous or you vomit. °· You suddenly feel light-headed or dizzy. °· Your heart begins to beat quickly, or it feels like it is skipping beats. °These symptoms may represent a serious problem that is an emergency. Do not wait to see if the symptoms will go away. Get medical help right away. Call your local emergency services (911 in the [place] Do not drive yourself to the hospital. °  °This information is not intended to replace advice given to you by your health care provider. Make sure you discuss any questions you have with your health care provider. °  °Document Released: 12/19/2004 Document Revised: 04/01/2014 Document Reviewed: 10/15/2013 °[employer]vier Interactive Patient Education ©2016 [employer] ° °

## 2015-07-29 NOTE — Progress Notes (Signed)
Order for alendronate has been discontinued on admission per [facility] policy.  Charlene Williams [facility] 07/29/2015 6:11 PM

## 2015-07-29 NOTE — H&P (Signed)
[facility] [place] NAME: Charlene Williams    MR#:  [id number]  DATE OF BIRTH:  1955/03/26  DATE OF ADMISSION:  07/29/2015  PRIMARY CARE PHYSICIAN: Kasandra Knudsen, NP   REQUESTING/REFERRING PHYSICIAN: Dr. Carrie Mew  CHIEF COMPLAINT:   Chief Complaint  Patient presents with  . Epistaxis  . Back Pain    HISTORY OF PRESENT ILLNESS:  Charlene Williams  is a 60 y.o. female with a known history of hypertension, COPD, long history of smoking who recently quit, new diagnosis of adenocarcinoma of left lung diagnosed in the last 2 weeks s/p first dose on chemo 07/27/15 comes secondary to several nonspecific symptoms including epistaxis, dizziness and nausea last night. Patient had 2 admissions in the last month for recurrent pneumonia and worsening pleural effusion. Cytology from the fluid revealed adenocarcinoma of the lung. She is started on chemotherapy yesterday and also received Neulasta. Also on steroids. She was complaining of right lower extremity pain and had Dopplers done yesterday which did not reveal any DVT. Patient went home and had the above symptoms last night and presented to the emergency room this morning. She also complains of epistaxis, significant according to patient that is resolved completely now. Platelets, PTT and INR are within normal limits. CT of the chest here reveals small segmental pulmonary emboli with no central pulmonary embolus. Patient on 1 L oxygen at home and currently needing 2 L. Denies any dyspnea or chest pain. Due to her epistaxis and being started on anticoagulation, will monitor her in the hospital.  PAST MEDICAL HISTORY:   Past Medical History  Diagnosis Date  . Diabetes mellitus without complication ([place])   . Sleep apnea   . Hypertension   . Aneurysm ([place])   . Hiatal hernia   . COPD (chronic obstructive pulmonary disease) ([place])     on home oxygen  . Illicit drug use   . Migraines   .  Hypercholesteremia   . Depression   . Spine disorder   . Family history of chronic pain 01/17/2015  . Lung cancer ([place])     adenocarcinoma left lung    PAST SURGICAL HISTORY:   Past Surgical History  Procedure Laterality Date  . Abdominal hysterectomy    . Hernia repair    . Eye surgery    . Video assisted thoracoscopy (vats)/thorocotomy Left 07/14/2015    Procedure: VIDEO ASSISTED THORACOSCOPY (VATS), pleural biopsy;  Surgeon: Nestor Lewandowsky, MD;  Location: [facility] ORS;  Service: General;  Laterality: Left;  . Portacath placement Left 07/14/2015    Procedure: INSERTION PORT-A-CATH;  Surgeon: Nestor Lewandowsky, MD;  Location: [facility] ORS;  Service: General;  Laterality: Left;  . Removal of pleural drainage catheter N/A 07/18/2015    Procedure: REMOVAL OF PLEURAL DRAINAGE CATHETER;  Surgeon: Nestor Lewandowsky, MD;  Location: [facility] ORS;  Service: Thoracic;  Laterality: N/A;    SOCIAL HISTORY:   Social History  Substance Use Topics  . Smoking status: Former Smoker -- 1.50 packs/day for 45 years    Types: Cigarettes    Quit date: 04/29/2015  . Smokeless tobacco: Not on file     Comment: Quit 2 months ago  . Alcohol Use: No    FAMILY HISTORY:   Family History  Problem Relation Age of Onset  . Heart disease Mother   . Breast cancer Sister     DRUG ALLERGIES:   Allergies  Allergen Reactions  . Ampicillin Hives and Other (See Comments)  Has patient had a PCN reaction causing immediate rash, facial/tongue/throat swelling, SOB or lightheadedness with hypotension: No Has patient had a PCN reaction causing severe rash involving mucus membranes or skin necrosis: No Has patient had a PCN reaction that required hospitalization No Has patient had a PCN reaction occurring within the last 10 years: Yes If all of the above answers are "NO", then may proceed with Cephalosporin use.  Marland Kitchen Penicillins Hives and Other (See Comments)    Has patient had a PCN reaction causing immediate rash,  facial/tongue/throat swelling, SOB or lightheadedness with hypotension: No Has patient had a PCN reaction causing severe rash involving mucus membranes or skin necrosis: No Has patient had a PCN reaction that required hospitalization No Has patient had a PCN reaction occurring within the last 10 years: Yes If all of the above answers are "NO", then may proceed with Cephalosporin use.    REVIEW OF SYSTEMS:   Review of Systems  Constitutional: Positive for malaise/fatigue. Negative for fever, chills and weight loss.  HENT: Positive for nosebleeds. Negative for ear discharge, ear pain and tinnitus.   Eyes: Negative for blurred vision, double vision and photophobia.  Respiratory: Positive for shortness of breath. Negative for cough, hemoptysis and wheezing.   Cardiovascular: Negative for chest pain, palpitations, orthopnea and leg swelling.  Gastrointestinal: Positive for nausea. Negative for heartburn, vomiting, abdominal pain, diarrhea, constipation and melena.  Genitourinary: Negative for dysuria, urgency and frequency.  Musculoskeletal: Positive for myalgias. Negative for back pain and neck pain.  Skin: Negative for rash.  Neurological: Positive for dizziness and headaches. Negative for tremors, sensory change, speech change and focal weakness.  Endo/Heme/Allergies: Does not bruise/bleed easily.  Psychiatric/Behavioral: Negative for depression.    MEDICATIONS AT HOME:   Prior to Admission medications   Medication Sig Start Date End Date Taking? Authorizing Provider  alendronate (FOSAMAX) 70 MG tablet Take 70 mg by mouth once a week. Pt takes on Tuesday.   Take with a full glass of water on an empty stomach.   Yes Historical Provider, MD  aspirin EC 81 MG tablet Take 81 mg by mouth daily.   Yes Historical Provider, MD  cetirizine (ZYRTEC) 10 MG tablet Take 10 mg by mouth at bedtime.   Yes Historical Provider, MD  dexamethasone (DECADRON) 4 MG tablet One pill every 12 hour x3 days;  start the day for before chemo. 07/27/15  Yes Cammie Sickle, MD  DULoxetine (CYMBALTA) 30 MG capsule Take 90 mg by mouth daily.    Yes Historical Provider, MD  enalapril (VASOTEC) 10 MG tablet Take 10 mg by mouth daily.    Yes Historical Provider, MD  fluticasone (FLOVENT HFA) 220 MCG/ACT inhaler Inhale 1 puff into the lungs 2 (two) times daily.   Yes Historical Provider, MD  folic acid (FOLVITE) 1 MG tablet Take 1 mg by mouth daily.   Yes Historical Provider, MD  lidocaine-prilocaine (EMLA) cream Apply 1 application topically as needed. 07/20/15  Yes Cammie Sickle, MD  loratadine (CLARITIN) 10 MG tablet Take 10 mg by mouth daily.   Yes Historical Provider, MD  magnesium oxide (MAG-OX) 400 (241.3 Mg) MG tablet Take 400 mg by mouth at bedtime.   Yes Historical Provider, MD  metFORMIN (GLUCOPHAGE-XR) 750 MG 24 hr tablet Take 750 mg by mouth daily with breakfast.   Yes Historical Provider, MD  metoprolol tartrate (LOPRESSOR) 25 MG tablet Take 1 tablet (25 mg total) by mouth 2 (two) times daily. 06/27/15  Yes Bettey Costa, MD  mirtazapine (REMERON) 45 MG tablet Take 45 mg by mouth at bedtime.   Yes Historical Provider, MD  omeprazole (PRILOSEC) 20 MG capsule Take 20 mg by mouth daily before breakfast.    Yes Historical Provider, MD  ondansetron (ZOFRAN) 8 MG tablet Take 1 tablet (8 mg total) by mouth every 8 (eight) hours as needed for nausea or vomiting (start 3 days; after chemo). 07/19/15  Yes Cammie Sickle, MD  oxyCODONE-acetaminophen (ROXICET) 5-325 MG tablet Take 1 tablet by mouth every 6 (six) hours as needed for severe pain. 07/28/15  Yes Cammie Sickle, MD  simvastatin (ZOCOR) 20 MG tablet Take 20 mg by mouth at bedtime.    Yes Historical Provider, MD  traZODone (DESYREL) 150 MG tablet Take 150 mg by mouth at bedtime.   Yes Historical Provider, MD  prochlorperazine (COMPAZINE) 10 MG tablet Take 1 tablet (10 mg total) by mouth every 6 (six) hours as needed for nausea or vomiting.  07/29/15   Carrie Mew, MD      VITAL SIGNS:  Blood pressure 164/87, pulse 85, temperature 100.1 F (37.8 C), temperature source Oral, resp. rate 21, height '5\' 3"'$  (1.6 m), weight 73.483 kg (162 lb), SpO2 96 %.  PHYSICAL EXAMINATION:   Physical Exam  GENERAL:  60 y.o.-year-old patient lying in the bed with no acute distress.  EYES: Pupils equal, round, reactive to light and accommodation. No scleral icterus. Extraocular muscles intact.  HEENT: Head atraumatic, normocephalic. Oropharynx clear and nasopharynx with old lesions healing on the inferior turbinates.  NECK: Supple, no jugular venous distention. No thyroid enlargement, no tenderness.  LUNGS: Scant sounds bilaterally, no wheezing, rales,rhonchi or crepitation. No use of accessory muscles of respiration.  CARDIOVASCULAR: S1, S2 normal. No murmurs, rubs, or gallops.  ABDOMEN: Soft, nontender, nondistended. Bowel sounds present. No organomegaly or mass.  EXTREMITIES: No pedal edema, cyanosis, or clubbing.  NEUROLOGIC: Cranial nerves II through XII are intact. Muscle strength 5/5 in all extremities. Sensation intact. Gait not checked.  PSYCHIATRIC: The patient is alert and oriented x 3.  SKIN: No obvious rash, lesion, or ulcer.   LABORATORY PANEL:   CBC  Recent Labs Lab 07/29/15 1356  WBC 33.0*  HGB 10.7*  HCT 32.8*  PLT 302   ------------------------------------------------------------------------------------------------------------------  Chemistries   Recent Labs Lab 07/29/15 1356  NA 139  K 4.2  CL 105  CO2 24  GLUCOSE 111*  BUN 11  CREATININE 0.82  CALCIUM 9.3  AST 27  ALT 23  ALKPHOS 81  BILITOT 0.5   ------------------------------------------------------------------------------------------------------------------  Cardiac Enzymes  Recent Labs Lab 07/29/15 1356  TROPONINI <0.03    ------------------------------------------------------------------------------------------------------------------  RADIOLOGY:  Ct Head Wo Contrast  07/29/2015  CLINICAL DATA:  Patient with nosebleeds.  Chest pain. EXAM: CT HEAD WITHOUT CONTRAST TECHNIQUE: Contiguous axial images were obtained from the base of the skull through the vertex without intravenous contrast. COMPARISON:  Brain CT 10/17/2009. FINDINGS: Ventricles and sulci are appropriate for patient's age. No evidence for acute cortically based infarct, intracranial hemorrhage, mass lesion or mass-effect. Orbits are unremarkable. Paranasal sinuses are unremarkable. Mastoid air cells are well aerated. Calvarium is intact. IMPRESSION: No acute intracranial process. Electronically Signed   By: Lovey Newcomer M.D.   On: 07/29/2015 15:16   Ct Angio Chest Pe W/cm &/or Wo Cm  07/29/2015  CLINICAL DATA:  Chest pain and shortness of breath.  Lung carcinoma EXAM: CT ANGIOGRAPHY CHEST WITH CONTRAST TECHNIQUE: Multidetector CT imaging of the chest was performed using the standard protocol  during bolus administration of intravenous contrast. Multiplanar CT image reconstructions and MIPs were obtained to evaluate the vascular anatomy. CONTRAST:  75 mL Isovue 370 nonionic COMPARISON:  Chest CT July 12, 2015; chest radiograph July 18, 2015 FINDINGS: Mediastinum/Lymph Nodes: There is a focal pulmonary embolus, incompletely obstructing, in the superior segment right lower lobe pulmonary artery branch. A small pulmonary embolus is noted in a peripheral branch of the posterior segment right upper lobe pulmonary artery. No more central pulmonary embolus is evident. There is no demonstrable right heart strain. There is no thoracic aortic aneurysm or dissection. The visualized great vessels show moderate calcification at their respective origins. There is moderate atherosclerotic calcification in the aorta. There are foci of coronary artery calcification present. There  is minimal pericardial thickening. Visualized thyroid appears normal. There is a stable right hilar lymph node measuring 2.1 x 1.3 cm. A second more inferiorly positioned right hilar lymph node measures 1.4 x 1.1 cm. There is a left hilar lymph node measuring 1.5 x 1.1 cm. Multiple smaller mediastinal lymph nodes are also apparent. Note that there is a left internal mammary lymph node measuring 0.9 x 0.6 cm. Lungs/Pleura: There is a fairly small left pleural effusion. There are areas of consolidation throughout inferior lingula and left lower lobe. The recently noted 5 cm mass in the left lower lobe remains without change. There is an irregular nodular opacity in the left apex measuring 8 x 8 mm, best seen on axial slice 19 series 6. There is a nodular opacity in the posterior segment of the left upper lobe measuring 1.1 x 1.3 cm, best seen on axial slice 39 series 6. There are scattered areas of mild atelectasis on the right. There is a 3 mm nodular opacity in the anterior segment right upper lobe seen on axial slice 58 series 6. There is a nodular opacity abutting the pleura in the posterior segment of the right upper lobe measuring 6 x 6 mm seen on axial slice 30 series 6. Upper abdomen: There is a stable 1.4 cm lesion in the left adrenal, likely an adenoma. Small pancreatic lesion seen on recent prior study is not apparent on this arterial phase contrast enhanced study. Musculoskeletal: There is an expansile lytic lesion in the posterior right fifth rib. There is degenerative change in the thoracic spine. Review of the MIP images confirms the above findings. IMPRESSION: There is a focal pulmonary embolus in a branch of the superior segment right lower lobe pulmonary artery. A smaller pulmonary embolus is noted in a peripheral branch of the posterior segment right upper lobe pulmonary artery. No more central pulmonary emboli identified. Parenchymal lung mass lesions, largest in the left lower lobe. Areas of  adenopathy. Expansile lytic lesion posterior right fifth rib. 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 Areas of airspace consolidation on the left. Fairly small pleural effusion on the left, much smaller compared to prior study. Areas of atherosclerotic change.  Coronary calcification noted. Critical Value/emergent results were called by telephone at the time of interpretation on 07/29/2015 at 3:31 pm to Dr. Carrie Mew , who verbally acknowledged these results. Electronically Signed   By: Lowella Grip III M.D.   On: 07/29/2015 15:32    EKG:   Orders placed or performed during the hospital encounter of 07/29/15  . ED EKG  . ED EKG    IMPRESSION AND PLAN:   Charlene Williams  is a 60 y.o. female with a known history of hypertension, COPD, long history of smoking  who recently quit, new diagnosis of adenocarcinoma of left lung diagnosed in the last 2 weeks s/p first dose on chemo 07/27/15 comes secondary to several nonspecific symptoms including epistaxis, dizziness and nausea last night.  #1 Pulmonary embolism- segmental pulmonary embolism and right lower lobe pulmonary artery and also right upper lobe artery. No central pulmonary emboli identified. -Patient is not hypoxic. Using oxygen but has home oxygen due to her lung cancer. -Started on IV heparin. Monitor closely for any bleeding episodes. Had epistaxis last night, none now. -If stable, can be transitioned to oral anticoagulation tomorrow morning and possible discharge. -Doppler of right lower extremity was done yesterday which was negative. Left lower extremity Doppler has been ordered as well.  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 -Following with oncology as outpatient. -Oncology consulted. -Started on chemotherapy and 07/27/2015. Also received Decadron. And possible Neulasta has been given.  #3 leukocytosis-likely  from Neulasta and also from Decadron  #4 hypertension-on metoprolol and enalapril  #5 diabetes mellitus-on metformin. Also added sliding scale insulin  #6 depression-continue home medications.  #7 DVT prophylaxis-patient will be on IV heparin     All the records are reviewed and case discussed with ED provider. Management plans discussed with the patient, family and they are in agreement.  CODE STATUS: Full Code  TOTAL TIME TAKING CARE OF THIS PATIENT: 50 minutes.    Gladstone Lighter M.D on 07/29/2015 at 4:16 PM  Between 7am to 6pm - Pager - [phone]  After 6pm go to [email or web address] - password [email or web address] [facility]  Office  [phone]  CC: Primary care physician; Kasandra Knudsen, NP

## 2015-07-29 NOTE — Progress Notes (Signed)
ANTICOAGULATION CONSULT NOTE - Initial Consult  Pharmacy Consult for heparin drip Indication: pulmonary embolus  Allergies  Allergen Reactions  . Ampicillin Hives and Other (See Comments)    Has patient had a PCN reaction causing immediate rash, facial/tongue/throat swelling, SOB or lightheadedness with hypotension: No Has patient had a PCN reaction causing severe rash involving mucus membranes or skin necrosis: No Has patient had a PCN reaction that required hospitalization No Has patient had a PCN reaction occurring within the last 10 years: Yes If all of the above answers are "NO", then may proceed with Cephalosporin use.  Marland Kitchen Penicillins Hives and Other (See Comments)    Has patient had a PCN reaction causing immediate rash, facial/tongue/throat swelling, SOB or lightheadedness with hypotension: No Has patient had a PCN reaction causing severe rash involving mucus membranes or skin necrosis: No Has patient had a PCN reaction that required hospitalization No Has patient had a PCN reaction occurring within the last 10 years: Yes If all of the above answers are "NO", then may proceed with Cephalosporin use.    Patient Measurements: Height: '5\' 3"'$  (160 cm) Weight: 158 lb (71.668 kg) IBW/kg (Calculated) : 52.4 Heparin Dosing Weight: 67 kg  Vital Signs: Temp: 98.8 F (37.1 C) (05/06 2014) Temp Source: Oral (05/06 2014) BP: 145/75 mmHg (05/06 2014) Pulse Rate: 85 (05/06 2014)  Labs:  Recent Labs  07/27/15 0818 07/29/15 1356 07/29/15 2247  HGB 10.6* 10.7*  --   HCT 31.7* 32.8*  --   PLT 350 302  --   APTT  --  34  --   LABPROT  --  14.3  --   INR  --  1.09  --   HEPARINUNFRC  --   --  0.77*  CREATININE 0.87 0.82  --   TROPONINI  --  <0.03  --    Estimated Creatinine Clearance: 68.4 mL/min (by C-G formula based on Cr of 0.82).  Medical History: Past Medical History  Diagnosis Date  . Diabetes mellitus without complication ([place])   . Sleep apnea   . Hypertension   .  Aneurysm ([place])   . Hiatal hernia   . COPD (chronic obstructive pulmonary disease) ([place])     on home oxygen  . Illicit drug use   . Migraines   . Hypercholesteremia   . Depression   . Spine disorder   . Family history of chronic pain 01/17/2015  . Lung cancer ([place])     adenocarcinoma left lung   Medications:  Prescriptions prior to admission  Medication Sig Dispense Refill Last Dose  . alendronate (FOSAMAX) 70 MG tablet Take 70 mg by mouth once a week. Pt takes on Tuesday.   Take with a full glass of water on an empty stomach.   07/25/2015  . aspirin EC 81 MG tablet Take 81 mg by mouth daily.   unknown  . cetirizine (ZYRTEC) 10 MG tablet Take 10 mg by mouth at bedtime.   07/28/2015 at Unknown time  . dexamethasone (DECADRON) 4 MG tablet One pill every 12 hour x3 days; start the day for before chemo. 30 tablet 0 unknown  . DULoxetine (CYMBALTA) 30 MG capsule Take 90 mg by mouth daily.    unknown  . enalapril (VASOTEC) 10 MG tablet Take 10 mg by mouth daily.    unknown  . fluticasone (FLOVENT HFA) 220 MCG/ACT inhaler Inhale 1 puff into the lungs 2 (two) times daily.   unknown  . folic acid (FOLVITE) 1 MG tablet Take  1 mg by mouth daily.   unknown  . lidocaine-prilocaine (EMLA) cream Apply 1 application topically as needed. 30 g 6 unknown  . loratadine (CLARITIN) 10 MG tablet Take 10 mg by mouth daily.   unknown  . magnesium oxide (MAG-OX) 400 (241.3 Mg) MG tablet Take 400 mg by mouth at bedtime.   07/28/2015 at Unknown time  . metFORMIN (GLUCOPHAGE-XR) 750 MG 24 hr tablet Take 750 mg by mouth daily with breakfast.   07/29/2015 at Unknown time  . metoprolol tartrate (LOPRESSOR) 25 MG tablet Take 1 tablet (25 mg total) by mouth 2 (two) times daily. 60 tablet 0 07/29/2015 at 1220  . mirtazapine (REMERON) 45 MG tablet Take 45 mg by mouth at bedtime.   07/28/2015 at Unknown time  . omeprazole (PRILOSEC) 20 MG capsule Take 20 mg by mouth daily before breakfast.    07/29/2015 at Unknown time  . ondansetron  (ZOFRAN) 8 MG tablet Take 1 tablet (8 mg total) by mouth every 8 (eight) hours as needed for nausea or vomiting (start 3 days; after chemo). 40 tablet 0 unknown  . oxyCODONE-acetaminophen (ROXICET) 5-325 MG tablet Take 1 tablet by mouth every 6 (six) hours as needed for severe pain. 30 tablet 0 unknown  . simvastatin (ZOCOR) 20 MG tablet Take 20 mg by mouth at bedtime.    07/28/2015 at Unknown time  . traZODone (DESYREL) 150 MG tablet Take 150 mg by mouth at bedtime.   07/28/2015 at Unknown time   Assessment: Pharmacy consulted to dose and monitor heparin drip in this 60 year old female with a newly diagnosed PE. Patient was not taking anticoagulants prior to admission. Spoke with RN prior to starting heparin drip and confirmed that nose bleed had resolved.   Baseline labs obtained and are as follows: Hgb 10.7 Plt 302  INR 1.09 APTT 34  Goal of Therapy:  Heparin level 0.3-0.7 units/ml Monitor platelets by anticoagulation protocol: Yes   Plan:  Give 3500 units bolus x 1 (~ 50 units/kg) Start heparin infusion at 1150 units/hr (~17 units/kg/hr) Check anti-Xa level in 6 hours and daily while on heparin (HL at 2230. Confirmed with RN that infusion started ~1630) Continue to monitor H&H and platelets  CBC ordered with AM labs. Thank you for the consult.  5/6 22:30 heparin level 0.77. Reduce rate to 1100 units/hr and recheck in 6 hours.  Eloise Harman, PharmD Clinical Pharmacist 07/29/2015,11:31 PM

## 2015-07-29 NOTE — Progress Notes (Addendum)
ANTICOAGULATION CONSULT NOTE - Initial Consult  Pharmacy Consult for heparin drip Indication: pulmonary embolus  Allergies  Allergen Reactions  . Ampicillin Hives and Other (See Comments)    Has patient had a PCN reaction causing immediate rash, facial/tongue/throat swelling, SOB or lightheadedness with hypotension: No Has patient had a PCN reaction causing severe rash involving mucus membranes or skin necrosis: No Has patient had a PCN reaction that required hospitalization No Has patient had a PCN reaction occurring within the last 10 years: Yes If all of the above answers are "NO", then may proceed with Cephalosporin use.  Marland Kitchen Penicillins Hives and Other (See Comments)    Has patient had a PCN reaction causing immediate rash, facial/tongue/throat swelling, SOB or lightheadedness with hypotension: No Has patient had a PCN reaction causing severe rash involving mucus membranes or skin necrosis: No Has patient had a PCN reaction that required hospitalization No Has patient had a PCN reaction occurring within the last 10 years: Yes If all of the above answers are "NO", then may proceed with Cephalosporin use.    Patient Measurements: Height: '5\' 3"'$  (160 cm) Weight: 158 lb (71.668 kg) IBW/kg (Calculated) : 52.4 Heparin Dosing Weight: 67 kg  Vital Signs: Temp: 98.6 F (37 C) (05/06 1755) Temp Source: Oral (05/06 1755) BP: 147/70 mmHg (05/06 1755) Pulse Rate: 86 (05/06 1755)  Labs:  Recent Labs  07/27/15 0818 07/29/15 1356  HGB 10.6* 10.7*  HCT 31.7* 32.8*  PLT 350 302  APTT  --  34  LABPROT  --  14.3  INR  --  1.09  CREATININE 0.87 0.82  TROPONINI  --  <0.03   Estimated Creatinine Clearance: 68.4 mL/min (by C-G formula based on Cr of 0.82).  Medical History: Past Medical History  Diagnosis Date  . Diabetes mellitus without complication ([place])   . Sleep apnea   . Hypertension   . Aneurysm ([place])   . Hiatal hernia   . COPD (chronic obstructive pulmonary disease) ([place])      on home oxygen  . Illicit drug use   . Migraines   . Hypercholesteremia   . Depression   . Spine disorder   . Family history of chronic pain 01/17/2015  . Lung cancer ([place])     adenocarcinoma left lung   Medications:  Prescriptions prior to admission  Medication Sig Dispense Refill Last Dose  . alendronate (FOSAMAX) 70 MG tablet Take 70 mg by mouth once a week. Pt takes on Tuesday.   Take with a full glass of water on an empty stomach.   07/25/2015  . aspirin EC 81 MG tablet Take 81 mg by mouth daily.   unknown  . cetirizine (ZYRTEC) 10 MG tablet Take 10 mg by mouth at bedtime.   07/28/2015 at Unknown time  . dexamethasone (DECADRON) 4 MG tablet One pill every 12 hour x3 days; start the day for before chemo. 30 tablet 0 unknown  . DULoxetine (CYMBALTA) 30 MG capsule Take 90 mg by mouth daily.    unknown  . enalapril (VASOTEC) 10 MG tablet Take 10 mg by mouth daily.    unknown  . fluticasone (FLOVENT HFA) 220 MCG/ACT inhaler Inhale 1 puff into the lungs 2 (two) times daily.   unknown  . folic acid (FOLVITE) 1 MG tablet Take 1 mg by mouth daily.   unknown  . lidocaine-prilocaine (EMLA) cream Apply 1 application topically as needed. 30 g 6 unknown  . loratadine (CLARITIN) 10 MG tablet Take 10 mg by mouth  daily.   unknown  . magnesium oxide (MAG-OX) 400 (241.3 Mg) MG tablet Take 400 mg by mouth at bedtime.   07/28/2015 at Unknown time  . metFORMIN (GLUCOPHAGE-XR) 750 MG 24 hr tablet Take 750 mg by mouth daily with breakfast.   07/29/2015 at Unknown time  . metoprolol tartrate (LOPRESSOR) 25 MG tablet Take 1 tablet (25 mg total) by mouth 2 (two) times daily. 60 tablet 0 07/29/2015 at 1220  . mirtazapine (REMERON) 45 MG tablet Take 45 mg by mouth at bedtime.   07/28/2015 at Unknown time  . omeprazole (PRILOSEC) 20 MG capsule Take 20 mg by mouth daily before breakfast.    07/29/2015 at Unknown time  . ondansetron (ZOFRAN) 8 MG tablet Take 1 tablet (8 mg total) by mouth every 8 (eight) hours as needed  for nausea or vomiting (start 3 days; after chemo). 40 tablet 0 unknown  . oxyCODONE-acetaminophen (ROXICET) 5-325 MG tablet Take 1 tablet by mouth every 6 (six) hours as needed for severe pain. 30 tablet 0 unknown  . simvastatin (ZOCOR) 20 MG tablet Take 20 mg by mouth at bedtime.    07/28/2015 at Unknown time  . traZODone (DESYREL) 150 MG tablet Take 150 mg by mouth at bedtime.   07/28/2015 at Unknown time   Assessment: Pharmacy consulted to dose and monitor heparin drip in this 60 year old female with a newly diagnosed PE. Patient was not taking anticoagulants prior to admission. Spoke with RN prior to starting heparin drip and confirmed that nose bleed had resolved.   Baseline labs obtained and are as follows: Hgb 10.7 Plt 302  INR 1.09 APTT 34  Goal of Therapy:  Heparin level 0.3-0.7 units/ml Monitor platelets by anticoagulation protocol: Yes   Plan:  Give 3500 units bolus x 1 (~ 50 units/kg) Start heparin infusion at 1150 units/hr (~17 units/kg/hr) Check anti-Xa level in 6 hours and daily while on heparin (HL at 2230. Confirmed with RN that infusion started ~1630) Continue to monitor H&H and platelets  CBC ordered with AM labs. Thank you for the consult.  Lenis Noon, PharmD Clinical Pharmacist 07/29/2015,6:30 PM

## 2015-07-29 NOTE — ED Notes (Signed)
Pt complains of nose bleed during the night. Pt is also complaining of pain to both sides. Pt states she has lung cancer and had tubes placed on both sides for drainage and recently had those pulled.

## 2015-07-29 NOTE — ED Notes (Signed)
Patient transported to Ultrasound 

## 2015-07-29 NOTE — ED Notes (Signed)
Pt states she feels weak and had to crawl to the bathroom this morning after her nose started to bleed. Pt says she had her first chemo treatment on Thursday.

## 2015-07-30 LAB — BASIC METABOLIC PANEL
ANION GAP: 7 (ref 5–15)
BUN: 11 mg/dL (ref 6–20)
CHLORIDE: 109 mmol/L (ref 101–111)
CO2: 23 mmol/L (ref 22–32)
Calcium: 8.7 mg/dL — ABNORMAL LOW (ref 8.9–10.3)
Creatinine, Ser: 0.82 mg/dL (ref 0.44–1.00)
GFR calc non Af Amer: >60 mL/min (ref >60)
Glucose, Bld: 93 mg/dL (ref 65–99)
POTASSIUM: 4.5 mmol/L (ref 3.5–5.1)
SODIUM: 139 mmol/L (ref 135–145)

## 2015-07-30 LAB — HEPARIN LEVEL (UNFRACTIONATED)
HEPARIN UNFRACTIONATED: 0.33 [IU]/mL (ref 0.30–0.70)
HEPARIN UNFRACTIONATED: 0.21 [IU]/mL — AB (ref 0.30–0.70)

## 2015-07-30 LAB — CBC
HCT: 31.4 % — ABNORMAL LOW (ref 35.0–47.0)
HEMOGLOBIN: 10.4 g/dL — AB (ref 12.0–16.0)
MCH: 27.3 pg (ref 26.0–34.0)
MCHC: 33.1 g/dL (ref 32.0–36.0)
MCV: 82.5 fL (ref 80.0–100.0)
Platelets: 273 10*3/uL (ref 150–440)
RBC: 3.81 MIL/uL (ref 3.80–5.20)
RDW: 15.1 % — AB (ref 11.5–14.5)
WBC: 27.7 10*3/uL — AB (ref 3.6–11.0)

## 2015-07-30 LAB — GLUCOSE, CAPILLARY
Glucose-Capillary: 93 mg/dL (ref 65–99)
Glucose-Capillary: 98 mg/dL (ref 65–99)

## 2015-07-30 MED ORDER — METOCLOPRAMIDE HCL 5 MG/ML IJ SOLN
10.0000 mg | Freq: Once | INTRAMUSCULAR | Status: AC
  Administered 2015-07-30: 13:00:00 10 mg via INTRAVENOUS
  Filled 2015-07-30: qty 2

## 2015-07-30 MED ORDER — MORPHINE SULFATE (PF) 2 MG/ML IV SOLN
2.0000 mg | INTRAVENOUS | Status: DC | PRN
Start: 2015-07-30 — End: 2015-07-30
  Administered 2015-07-30: 2 mg via INTRAVENOUS
  Filled 2015-07-30: qty 1

## 2015-07-30 MED ORDER — HEPARIN SOD (PORK) LOCK FLUSH 100 UNIT/ML IV SOLN
500.0000 [IU] | Freq: Once | INTRAVENOUS | Status: AC
  Administered 2015-07-30: 13:00:00 500 [IU] via INTRAVENOUS
  Filled 2015-07-30: qty 5

## 2015-07-30 MED ORDER — APIXABAN 5 MG PO TABS: 5.0000 mg | ORAL_TABLET | Freq: Two times a day (BID) | ORAL | Status: AC

## 2015-07-30 MED ORDER — PROMETHAZINE HCL 25 MG RE SUPP: 25.0000 mg | Freq: Four times a day (QID) | RECTAL | Status: DC | PRN

## 2015-07-30 MED ORDER — APIXABAN 5 MG PO TABS
10.0000 mg | ORAL_TABLET | Freq: Once | ORAL | Status: AC
  Administered 2015-07-30: 12:00:00 10 mg via ORAL
  Filled 2015-07-30: qty 2

## 2015-07-30 NOTE — Progress Notes (Signed)
ANTICOAGULATION CONSULT NOTE - Initial Consult  Pharmacy Consult for heparin drip Indication: pulmonary embolus  Allergies  Allergen Reactions  . Ampicillin Hives and Other (See Comments)    Has patient had a PCN reaction causing immediate rash, facial/tongue/throat swelling, SOB or lightheadedness with hypotension: No Has patient had a PCN reaction causing severe rash involving mucus membranes or skin necrosis: No Has patient had a PCN reaction that required hospitalization No Has patient had a PCN reaction occurring within the last 10 years: Yes If all of the above answers are "NO", then may proceed with Cephalosporin use.  Marland Kitchen Penicillins Hives and Other (See Comments)    Has patient had a PCN reaction causing immediate rash, facial/tongue/throat swelling, SOB or lightheadedness with hypotension: No Has patient had a PCN reaction causing severe rash involving mucus membranes or skin necrosis: No Has patient had a PCN reaction that required hospitalization No Has patient had a PCN reaction occurring within the last 10 years: Yes If all of the above answers are "NO", then may proceed with Cephalosporin use.    Patient Measurements: Height: '5\' 3"'$  (160 cm) Weight: 158 lb (71.668 kg) IBW/kg (Calculated) : 52.4 Heparin Dosing Weight: 67 kg  Vital Signs: Temp: 98.5 F (36.9 C) (05/07 0559) Temp Source: Oral (05/07 0559) BP: 138/76 mmHg (05/07 0559) Pulse Rate: 90 (05/07 0559)  Labs:  Recent Labs  07/27/15 0818 07/29/15 1356 07/29/15 2247 07/30/15 0529  HGB 10.6* 10.7*  --  10.4*  HCT 31.7* 32.8*  --  31.4*  PLT 350 302  --  273  APTT  --  34  --   --   LABPROT  --  14.3  --   --   INR  --  1.09  --   --   HEPARINUNFRC  --   --  0.77* 0.33  CREATININE 0.87 0.82  --  0.82  TROPONINI  --  <0.03  --   --    Estimated Creatinine Clearance: 68.4 mL/min (by C-G formula based on Cr of 0.82).  Medical History: Past Medical History  Diagnosis Date  . Diabetes mellitus without  complication ([place])   . Sleep apnea   . Hypertension   . Aneurysm ([place])   . Hiatal hernia   . COPD (chronic obstructive pulmonary disease) ([place])     on home oxygen  . Illicit drug use   . Migraines   . Hypercholesteremia   . Depression   . Spine disorder   . Family history of chronic pain 01/17/2015  . Lung cancer ([place])     adenocarcinoma left lung   Medications:  Prescriptions prior to admission  Medication Sig Dispense Refill Last Dose  . alendronate (FOSAMAX) 70 MG tablet Take 70 mg by mouth once a week. Pt takes on Tuesday.   Take with a full glass of water on an empty stomach.   07/25/2015  . aspirin EC 81 MG tablet Take 81 mg by mouth daily.   unknown  . cetirizine (ZYRTEC) 10 MG tablet Take 10 mg by mouth at bedtime.   07/28/2015 at Unknown time  . dexamethasone (DECADRON) 4 MG tablet One pill every 12 hour x3 days; start the day for before chemo. 30 tablet 0 unknown  . DULoxetine (CYMBALTA) 30 MG capsule Take 90 mg by mouth daily.    unknown  . enalapril (VASOTEC) 10 MG tablet Take 10 mg by mouth daily.    unknown  . fluticasone (FLOVENT HFA) 220 MCG/ACT inhaler Inhale 1 puff  into the lungs 2 (two) times daily.   unknown  . folic acid (FOLVITE) 1 MG tablet Take 1 mg by mouth daily.   unknown  . lidocaine-prilocaine (EMLA) cream Apply 1 application topically as needed. 30 g 6 unknown  . loratadine (CLARITIN) 10 MG tablet Take 10 mg by mouth daily.   unknown  . magnesium oxide (MAG-OX) 400 (241.3 Mg) MG tablet Take 400 mg by mouth at bedtime.   07/28/2015 at Unknown time  . metFORMIN (GLUCOPHAGE-XR) 750 MG 24 hr tablet Take 750 mg by mouth daily with breakfast.   07/29/2015 at Unknown time  . metoprolol tartrate (LOPRESSOR) 25 MG tablet Take 1 tablet (25 mg total) by mouth 2 (two) times daily. 60 tablet 0 07/29/2015 at 1220  . mirtazapine (REMERON) 45 MG tablet Take 45 mg by mouth at bedtime.   07/28/2015 at Unknown time  . omeprazole (PRILOSEC) 20 MG capsule Take 20 mg by mouth daily  before breakfast.    07/29/2015 at Unknown time  . ondansetron (ZOFRAN) 8 MG tablet Take 1 tablet (8 mg total) by mouth every 8 (eight) hours as needed for nausea or vomiting (start 3 days; after chemo). 40 tablet 0 unknown  . oxyCODONE-acetaminophen (ROXICET) 5-325 MG tablet Take 1 tablet by mouth every 6 (six) hours as needed for severe pain. 30 tablet 0 unknown  . simvastatin (ZOCOR) 20 MG tablet Take 20 mg by mouth at bedtime.    07/28/2015 at Unknown time  . traZODone (DESYREL) 150 MG tablet Take 150 mg by mouth at bedtime.   07/28/2015 at Unknown time   Assessment: Pharmacy consulted to dose and monitor heparin drip in this 60 year old female with a newly diagnosed PE. Patient was not taking anticoagulants prior to admission. Spoke with RN prior to starting heparin drip and confirmed that nose bleed had resolved.   Baseline labs obtained and are as follows: Hgb 10.7 Plt 302  INR 1.09 APTT 34  Goal of Therapy:  Heparin level 0.3-0.7 units/ml Monitor platelets by anticoagulation protocol: Yes   Plan:  Give 3500 units bolus x 1 (~ 50 units/kg) Start heparin infusion at 1150 units/hr (~17 units/kg/hr) Check anti-Xa level in 6 hours and daily while on heparin (HL at 2230. Confirmed with RN that infusion started ~1630) Continue to monitor H&H and platelets  CBC ordered with AM labs. Thank you for the consult.  5/6 22:30 heparin level 0.77. Reduce rate to 1100 units/hr and recheck in 6 hours.  5/7 AM heparin level 0.33. Continue current regimen and recheck in 6 hours to confirm.  Eloise Harman, PharmD Clinical Pharmacist 07/30/2015,6:34 AM

## 2015-07-30 NOTE — Progress Notes (Signed)
Pt being discharged home, discharge instructions and prescriptions reviewed with pt and family, states understanding, pt with no noted complaints of pain, no distress or discomfort noted, pt states that her nausea has improved since dose of reglan given, educated pt on importance of keeping hydrated, states understanding

## 2015-07-30 NOTE — Progress Notes (Signed)
Dr Verdell Carmine made aware that pt states zofran not helping nausea, pt has only had a small amt of emesis with episodes, wants additional nausea med before discharge, new order for reglan '10mg'$  IV push once

## 2015-07-30 NOTE — Plan of Care (Signed)
Problem: Safety: Goal: Ability to remain free from injury will improve Outcome: Progressing Patient refusing bed alarm.  She is steady on her feet and a moderate fall risk.

## 2015-07-30 NOTE — Discharge Summary (Signed)
[facility] [place] NAME: Asra Gambrel    MR#:  [id number]  DATE OF BIRTH:  [birth year]  DATE OF ADMISSION:  07/29/2015 ADMITTING PHYSICIAN: Gladstone Lighter, MD  DATE OF DISCHARGE: 07/30/2015  PRIMARY CARE PHYSICIAN: HOLLAND, [place], NP    ADMISSION DIAGNOSIS:  Epistaxis [R04.0] Swelling [R60.9] Nonspecific chest pain [R07.9] Primary cancer of left lower lobe of lung ([place]) [C34.32] Primary cancer of left lung metastatic to other site [facility]) [C34.92] Acute nonintractable headache, unspecified headache type [R51] Other acute pulmonary embolism without acute cor pulmonale (HCC) [I26.99]  DISCHARGE DIAGNOSIS:  Active Problems:   Pulmonary embolism ([place])   SECONDARY DIAGNOSIS:   Past Medical History  Diagnosis Date  . Diabetes mellitus without complication ([place])   . Sleep apnea   . Hypertension   . Aneurysm ([place])   . Hiatal hernia   . COPD (chronic obstructive pulmonary disease) ([place])     on home oxygen  . Illicit drug use   . Migraines   . Hypercholesteremia   . Depression   . Spine disorder   . Family history of chronic pain 01/17/2015  . Lung cancer ([place])     adenocarcinoma left lung    HOSPITAL COURSE:   60 y.o. female with a known history of hypertension, COPD, long history of smoking who recently quit, new diagnosis of adenocarcinoma of left lung diagnosed in the last 2 weeks s/p first dose on chemo 07/27/15 comes secondary to several nonspecific symptoms including epistaxis, dizziness and nausea last night.  #1 Pulmonary embolism- segmental pulmonary embolism and right lower lobe pulmonary artery and also right upper lobe artery. No central pulmonary emboli identified. -Patient was not hypoxic. Already on chronic O2 at home due to Lung Cancer/COPD -Patient was started on IV heparin while in the hospital. She is clinically stable and not being discharged on oral Eliquis. Dopplers of her lower extremities were negative for  DVT.  #2 mild epistaxis-patient had some mild epistaxis this morning and also on admission. Her hemoglobin is stable. -I will discontinue her aspirin. Continue Eliquis given her pulmonary embolism. This can be further followed as an outpatient and if it persists or gets worse she may need an ENT evaluation.  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 - cont. To follow up with Oncology as outpatient.   #4 leukocytosis-likely from Neulasta and also from Decadron - stable and can be monitored as outpatient.   #5 hypertension- cont. metoprolol and enalapril  #6 diabetes mellitus- cont.  metformin. BS stable while in hospital.   #7 depression- cont. Cymbalta, Remeron.   DISCHARGE CONDITIONS:   Stable.   CONSULTS OBTAINED:  Treatment Team:  Lequita Asal, MD  DRUG ALLERGIES:   Allergies  Allergen Reactions  . Ampicillin Hives and Other (See Comments)    Has patient had a PCN reaction causing immediate rash, facial/tongue/throat swelling, SOB or lightheadedness with hypotension: No Has patient had a PCN reaction causing severe rash involving mucus membranes or skin necrosis: No Has patient had a PCN reaction that required hospitalization No Has patient had a PCN reaction occurring within the last 10 years: Yes If all of the above answers are "NO", then may proceed with Cephalosporin use.  Marland Kitchen Penicillins Hives and Other (See Comments)    Has patient had a PCN reaction causing immediate rash, facial/tongue/throat swelling, SOB or lightheadedness with hypotension: No  Has patient had a PCN reaction causing severe rash involving mucus membranes or skin necrosis: No Has patient had a PCN reaction that required hospitalization No Has patient had a PCN reaction occurring within the last 10 years: Yes If all of the above answers are "NO", then may proceed with Cephalosporin use.     DISCHARGE MEDICATIONS:   Current Discharge Medication List    START taking these medications   Details  apixaban (ELIQUIS) 5 MG TABS tablet Take 1 tablet (5 mg total) by mouth 2 (two) times daily. Qty: 60 tablet, Refills: 1      CONTINUE these medications which have CHANGED   Details  prochlorperazine (COMPAZINE) 10 MG tablet Take 1 tablet (10 mg total) by mouth every 6 (six) hours as needed for nausea or vomiting. Qty: 30 tablet, Refills: 0   Associated Diagnoses: Primary cancer of left lower lobe of lung ([place]); Primary cancer of left lung metastatic to other site [facility])      CONTINUE these medications which have NOT CHANGED   Details  alendronate (FOSAMAX) 70 MG tablet Take 70 mg by mouth once a week. Pt takes on Tuesday.   Take with a full glass of water on an empty stomach.    cetirizine (ZYRTEC) 10 MG tablet Take 10 mg by mouth at bedtime.    dexamethasone (DECADRON) 4 MG tablet One pill every 12 hour x3 days; start the day for before chemo. Qty: 30 tablet, Refills: 0    DULoxetine (CYMBALTA) 30 MG capsule Take 90 mg by mouth daily.     enalapril (VASOTEC) 10 MG tablet Take 10 mg by mouth daily.     fluticasone (FLOVENT HFA) 220 MCG/ACT inhaler Inhale 1 puff into the lungs 2 (two) times daily.    folic acid (FOLVITE) 1 MG tablet Take 1 mg by mouth daily.    lidocaine-prilocaine (EMLA) cream Apply 1 application topically as needed. Qty: 30 g, Refills: 6   Associated Diagnoses: Primary cancer of left lung metastatic to other site (HCC)    loratadine (CLARITIN) 10 MG tablet Take 10 mg by mouth daily.    magnesium oxide (MAG-OX) 400 (241.3 Mg) MG tablet Take 400 mg by mouth at bedtime.    metFORMIN (GLUCOPHAGE-XR) 750 MG 24 hr tablet Take 750 mg by mouth daily with breakfast.    metoprolol tartrate (LOPRESSOR) 25 MG tablet Take 1 tablet (25 mg total) by mouth 2 (two) times daily. Qty: 60 tablet, Refills: 0    mirtazapine (REMERON) 45 MG tablet Take 45 mg by mouth  at bedtime.    omeprazole (PRILOSEC) 20 MG capsule Take 20 mg by mouth daily before breakfast.     ondansetron (ZOFRAN) 8 MG tablet Take 1 tablet (8 mg total) by mouth every 8 (eight) hours as needed for nausea or vomiting (start 3 days; after chemo). Qty: 40 tablet, Refills: 0   Associated Diagnoses: Primary cancer of left lower lobe of lung ([place]); Primary cancer of left lung metastatic to other site (HCC)    oxyCODONE-acetaminophen (ROXICET) 5-325 MG tablet Take 1 tablet by mouth every 6 (six) hours as needed for severe pain. Qty: 30 tablet, Refills: 0    simvastatin (ZOCOR) 20 MG tablet Take 20 mg by mouth at bedtime.     traZODone (DESYREL) 150 MG tablet Take 150 mg by mouth at bedtime.      STOP taking these medications     aspirin EC 81 MG tablet  DISCHARGE INSTRUCTIONS:   DIET:  Cardiac diet and Diabetic diet  DISCHARGE CONDITION:  Stable  ACTIVITY:  Activity as tolerated  OXYGEN:  Home Oxygen: Yes.     Oxygen Delivery: 2 liters/min via Patient connected to nasal cannula oxygen  DISCHARGE LOCATION:  home   If you experience worsening of your admission symptoms, develop shortness of breath, life threatening emergency, suicidal or homicidal thoughts you must seek medical attention immediately by calling 911 or calling your MD immediately  if symptoms less severe.  You Must read complete instructions/literature along with all the possible adverse reactions/side effects for all the Medicines you take and that have been prescribed to you. Take any new Medicines after you have completely understood and accpet all the possible adverse reactions/side effects.   Please note  You were cared for by a hospitalist during your hospital stay. If you have any questions about your discharge medications or the care you received while you were in the hospital after you are discharged, you can call the unit and asked to speak with the hospitalist on call if the hospitalist  that took care of you is not available. Once you are discharged, your primary care physician will handle any further medical issues. Please note that NO REFILLS for any discharge medications will be authorized once you are discharged, as it is imperative that you return to your primary care physician (or establish a relationship with a primary care physician if you do not have one) for your aftercare needs so that they can reassess your need for medications and monitor your lab values.     Today   No chest pain, shortness of breath. No worsening hypoxia. Had some mild epistaxis.  VITAL SIGNS:  Blood pressure 138/76, pulse 90, temperature 98.5 F (36.9 C), temperature source Oral, resp. rate 18, height '5\' 3"'$  (1.6 m), weight 71.668 kg (158 lb), SpO2 94 %.  I/O:   Intake/Output Summary (Last 24 hours) at 07/30/15 1138 Last data filed at 07/29/15 2102  Gross per 24 hour  Intake      0 ml  Output      0 ml  Net      0 ml    PHYSICAL EXAMINATION:  GENERAL:  60 y.o.-year-old patient sitting up in bed in no acute distress.  EYES: Pupils equal, round, reactive to light and accommodation. No scleral icterus. Extraocular muscles intact.  HEENT: Head atraumatic, normocephalic. Oropharynx and nasopharynx clear.  NECK:  Supple, no jugular venous distention. No thyroid enlargement, no tenderness.  LUNGS: Normal breath sounds bilaterally, no wheezing, rales,rhonchi. No use of accessory muscles of respiration.  CARDIOVASCULAR: S1, S2 normal. No murmurs, rubs, or gallops.  ABDOMEN: Soft, non-tender, non-distended. Bowel sounds present. No organomegaly or mass.  EXTREMITIES: No pedal edema, cyanosis, or clubbing.  NEUROLOGIC: Cranial nerves II through XII are intact. No focal motor or sensory defecits b/l.  PSYCHIATRIC: The patient is alert and oriented x 3. Good affect.  SKIN: No obvious rash, lesion, or ulcer.   DATA REVIEW:   CBC  Recent Labs Lab 07/30/15 0529  WBC 27.7*  HGB 10.4*  HCT  31.4*  PLT 273    Chemistries   Recent Labs Lab 07/29/15 1356 07/30/15 0529  NA 139 139  K 4.2 4.5  CL 105 109  CO2 24 23  GLUCOSE 111* 93  BUN 11 11  CREATININE 0.82 0.82  CALCIUM 9.3 8.7*  AST 27  --   ALT 23  --   ALKPHOS  81  --   BILITOT 0.5  --     Cardiac Enzymes  Recent Labs Lab 07/29/15 [place] <0.03    Microbiology Results  Results for orders placed or performed during the hospital encounter of 07/11/15  CULTURE, BLOOD (ROUTINE X 2) w Reflex to PCR ID Panel     Status: None   Collection Time: 07/16/15 11:31 AM  Result Value Ref Range Status   Specimen Description BLOOD RIGHT WRIST  Final   Special Requests   Final    BOTTLES DRAWN AEROBIC AND ANAEROBIC  AER 4CC ANA 2CC   Culture NO GROWTH 5 DAYS  Final   Report Status 07/21/2015 FINAL  Final  CULTURE, BLOOD (ROUTINE X 2) w Reflex to PCR ID Panel     Status: None   Collection Time: 07/16/15 11:40 AM  Result Value Ref Range Status   Specimen Description BLOOD LEFT WRIST  Final   Special Requests   Final    BOTTLES DRAWN AEROBIC AND ANAEROBIC  AER 5CC ANA 1CC   Culture NO GROWTH 5 DAYS  Final   Report Status 07/21/2015 FINAL  Final    RADIOLOGY:  Ct Head Wo Contrast  07/29/2015  CLINICAL DATA:  Patient with nosebleeds.  Chest pain. EXAM: CT HEAD WITHOUT CONTRAST TECHNIQUE: Contiguous axial images were obtained from the base of the skull through the vertex without intravenous contrast. COMPARISON:  Brain CT 10/17/2009. FINDINGS: Ventricles and sulci are appropriate for patient's age. No evidence for acute cortically based infarct, intracranial hemorrhage, mass lesion or mass-effect. Orbits are unremarkable. Paranasal sinuses are unremarkable. Mastoid air cells are well aerated. Calvarium is intact. IMPRESSION: No acute intracranial process. Electronically Signed   By: Lovey Newcomer M.D.   On: 07/29/2015 15:16   Ct Angio Chest Pe W/cm &/or Wo Cm  07/29/2015  CLINICAL DATA:  Chest pain and shortness of  breath.  Lung carcinoma EXAM: CT ANGIOGRAPHY CHEST WITH CONTRAST TECHNIQUE: Multidetector CT imaging of the chest was performed using the standard protocol during bolus administration of intravenous contrast. Multiplanar CT image reconstructions and MIPs were obtained to evaluate the vascular anatomy. CONTRAST:  75 mL Isovue 370 nonionic COMPARISON:  Chest CT July 12, 2015; chest radiograph July 18, 2015 FINDINGS: Mediastinum/Lymph Nodes: There is a focal pulmonary embolus, incompletely obstructing, in the superior segment right lower lobe pulmonary artery branch. A small pulmonary embolus is noted in a peripheral branch of the posterior segment right upper lobe pulmonary artery. No more central pulmonary embolus is evident. There is no demonstrable right heart strain. There is no thoracic aortic aneurysm or dissection. The visualized great vessels show moderate calcification at their respective origins. There is moderate atherosclerotic calcification in the aorta. There are foci of coronary artery calcification present. There is minimal pericardial thickening. Visualized thyroid appears normal. There is a stable right hilar lymph node measuring 2.1 x 1.3 cm. A second more inferiorly positioned right hilar lymph node measures 1.4 x 1.1 cm. There is a left hilar lymph node measuring 1.5 x 1.1 cm. Multiple smaller mediastinal lymph nodes are also apparent. Note that there is a left internal mammary lymph node measuring 0.9 x 0.6 cm. Lungs/Pleura: There is a fairly small left pleural effusion. There are areas of consolidation throughout inferior lingula and left lower lobe. The recently noted 5 cm mass in the left lower lobe remains without change. There is an irregular nodular opacity in the left apex measuring 8 x 8 mm, best seen on axial slice  19 series 6. There is a nodular opacity in the posterior segment of the left upper lobe measuring 1.1 x 1.3 cm, best seen on axial slice 39 series 6. There are scattered  areas of mild atelectasis on the right. There is a 3 mm nodular opacity in the anterior segment right upper lobe seen on axial slice 58 series 6. There is a nodular opacity abutting the pleura in the posterior segment of the right upper lobe measuring 6 x 6 mm seen on axial slice 30 series 6. Upper abdomen: There is a stable 1.4 cm lesion in the left adrenal, likely an adenoma. Small pancreatic lesion seen on recent prior study is not apparent on this arterial phase contrast enhanced study. Musculoskeletal: There is an expansile lytic lesion in the posterior right fifth rib. There is degenerative change in the thoracic spine. Review of the MIP images confirms the above findings. IMPRESSION: There is a focal pulmonary embolus in a branch of the superior segment right lower lobe pulmonary artery. A smaller pulmonary embolus is noted in a peripheral branch of the posterior segment right upper lobe pulmonary artery. No more central pulmonary emboli identified. Parenchymal lung mass lesions, largest in the left lower lobe. Areas of adenopathy. Expansile lytic lesion posterior right fifth rib. 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 Areas of airspace consolidation on the left. Fairly small pleural effusion on the left, much smaller compared to prior study. Areas of atherosclerotic change.  Coronary calcification noted. Critical Value/emergent results were called by telephone at the time of interpretation on 07/29/2015 at 3:31 pm to Dr. Carrie Mew , who verbally acknowledged these results. Electronically Signed   By: Lowella Grip III M.D.   On: 07/29/2015 15:32   US Venous Img Lower Unilateral Left  07/29/2015  CLINICAL DATA:  60 year old female with a history of swelling EXAM: LEFT LOWER EXTREMITY VENOUS DOPPLER ULTRASOUND TECHNIQUE: Gray-scale sonography with graded compression, as well as color Doppler and duplex ultrasound were performed to evaluate the lower extremity deep venous systems from the level  of the common femoral vein and including the common femoral, femoral, profunda femoral, popliteal and calf veins including the posterior tibial, peroneal and gastrocnemius veins when visible. The superficial great saphenous vein was also interrogated. Spectral Doppler was utilized to evaluate flow at rest and with distal augmentation maneuvers in the common femoral, femoral and popliteal veins. COMPARISON:  None. FINDINGS: Contralateral Common Femoral Vein: Respiratory phasicity is normal and symmetric with the symptomatic side. No evidence of thrombus. Normal compressibility. Common Femoral Vein: No evidence of thrombus. Normal compressibility, respiratory phasicity and response to augmentation. Saphenofemoral Junction: No evidence of thrombus. Normal compressibility and flow on color Doppler imaging. Profunda Femoral Vein: No evidence of thrombus. Normal compressibility and flow on color Doppler imaging. Femoral Vein: No evidence of thrombus. Normal compressibility, respiratory phasicity and response to augmentation. Popliteal Vein: No evidence of thrombus. Normal compressibility, respiratory phasicity and response to augmentation. Calf Veins: No evidence of thrombus. Normal compressibility and flow on color Doppler imaging. Superficial Great Saphenous Vein: No evidence of thrombus. Normal compressibility and flow on color Doppler imaging. Other Findings:  None. IMPRESSION: Sonographic survey left lower extremity negative for DVT. Signed, Dulcy Fanny. Earleen Newport, DO [facility] Electronically Signed   By: Corrie Mckusick D.O.   On: 07/29/2015 17:10      Management plans discussed with the patient, family and they are in agreement.  CODE STATUS:     Code Status Orders  Start     Ordered   07/29/15 1757  Full code   Continuous     07/29/15 1756    Code Status History    Date Active Date Inactive Code Status Order ID Comments User Context    07/11/2015  5:25 PM 07/19/2015  6:21 PM Full Code [id number]  Henreitta Leber, MD Inpatient   06/25/2015  6:35 PM 06/27/2015  4:00 PM Full Code [id number]  Theodoro Grist, MD ED      TOTAL TIME TAKING CARE OF THIS PATIENT: 40 minutes.    Henreitta Leber M.D on 07/30/2015 at 11:38 AM  Between 7am to 6pm - Pager - [phone]  After 6pm go to [email or web address] - password [email or web address] [facility]  [facility]  Office  [phone]  CC: Primary care physician; Kasandra Knudsen, NP

## 2015-07-30 NOTE — Progress Notes (Signed)
Dr Verdell Carmine made aware in person that pt with mild nose bleed, bleeding stopped within a few minutes, MD states that its ok to discharge pt

## 2015-08-01 ENCOUNTER — Encounter: Payer: Self-pay | Admitting: Internal Medicine

## 2015-08-09 ENCOUNTER — Inpatient Hospital Stay
Admission: EM | Admit: 2015-08-09 | Discharge: 2015-08-12 | DRG: 871 | Disposition: A | Discharge status: Home Health | Payer: Medicare Other | Attending: Internal Medicine | Admitting: Internal Medicine

## 2015-08-09 ENCOUNTER — Emergency Department: Payer: Medicare Other

## 2015-08-09 ENCOUNTER — Encounter: Payer: Self-pay | Admitting: Emergency Medicine

## 2015-08-09 DIAGNOSIS — J189 Pneumonia, unspecified organism: Secondary | ICD-10-CM | POA: Diagnosis present

## 2015-08-09 DIAGNOSIS — A419 Sepsis, unspecified organism: Principal | ICD-10-CM | POA: Diagnosis present

## 2015-08-09 DIAGNOSIS — J449 Chronic obstructive pulmonary disease, unspecified: Secondary | ICD-10-CM | POA: Diagnosis present

## 2015-08-09 DIAGNOSIS — E7800 Pure hypercholesterolemia, unspecified: Secondary | ICD-10-CM | POA: Diagnosis present

## 2015-08-09 DIAGNOSIS — C3492 Malignant neoplasm of unspecified part of left bronchus or lung: Secondary | ICD-10-CM | POA: Diagnosis present

## 2015-08-09 DIAGNOSIS — Z8249 Family history of ischemic heart disease and other diseases of the circulatory system: Secondary | ICD-10-CM | POA: Diagnosis not present

## 2015-08-09 DIAGNOSIS — I2699 Other pulmonary embolism without acute cor pulmonale: Secondary | ICD-10-CM | POA: Diagnosis present

## 2015-08-09 DIAGNOSIS — G8929 Other chronic pain: Secondary | ICD-10-CM | POA: Diagnosis present

## 2015-08-09 DIAGNOSIS — Z803 Family history of malignant neoplasm of breast: Secondary | ICD-10-CM

## 2015-08-09 DIAGNOSIS — Z9221 Personal history of antineoplastic chemotherapy: Secondary | ICD-10-CM | POA: Diagnosis not present

## 2015-08-09 DIAGNOSIS — J9811 Atelectasis: Secondary | ICD-10-CM | POA: Diagnosis present

## 2015-08-09 DIAGNOSIS — E119 Type 2 diabetes mellitus without complications: Secondary | ICD-10-CM

## 2015-08-09 DIAGNOSIS — Z7983 Long term (current) use of bisphosphonates: Secondary | ICD-10-CM

## 2015-08-09 DIAGNOSIS — Z7984 Long term (current) use of oral hypoglycemic drugs: Secondary | ICD-10-CM | POA: Diagnosis not present

## 2015-08-09 DIAGNOSIS — Z87891 Personal history of nicotine dependence: Secondary | ICD-10-CM

## 2015-08-09 DIAGNOSIS — F32A Depression, unspecified: Secondary | ICD-10-CM | POA: Diagnosis present

## 2015-08-09 DIAGNOSIS — Z880 Allergy status to penicillin: Secondary | ICD-10-CM

## 2015-08-09 DIAGNOSIS — Z9981 Dependence on supplemental oxygen: Secondary | ICD-10-CM | POA: Diagnosis not present

## 2015-08-09 DIAGNOSIS — X501XXA Overexertion from prolonged static or awkward postures, initial encounter: Secondary | ICD-10-CM | POA: Diagnosis not present

## 2015-08-09 DIAGNOSIS — Z79899 Other long term (current) drug therapy: Secondary | ICD-10-CM

## 2015-08-09 DIAGNOSIS — J440 Chronic obstructive pulmonary disease with acute lower respiratory infection: Secondary | ICD-10-CM | POA: Diagnosis present

## 2015-08-09 DIAGNOSIS — F329 Major depressive disorder, single episode, unspecified: Secondary | ICD-10-CM | POA: Diagnosis present

## 2015-08-09 DIAGNOSIS — Z7901 Long term (current) use of anticoagulants: Secondary | ICD-10-CM

## 2015-08-09 DIAGNOSIS — Y95 Nosocomial condition: Secondary | ICD-10-CM | POA: Diagnosis present

## 2015-08-09 DIAGNOSIS — S92154A Nondisplaced avulsion fracture (chip fracture) of right talus, initial encounter for closed fracture: Secondary | ICD-10-CM | POA: Diagnosis present

## 2015-08-09 DIAGNOSIS — I10 Essential (primary) hypertension: Secondary | ICD-10-CM | POA: Diagnosis present

## 2015-08-09 DIAGNOSIS — G4730 Sleep apnea, unspecified: Secondary | ICD-10-CM | POA: Diagnosis present

## 2015-08-09 DIAGNOSIS — S92101A Unspecified fracture of right talus, initial encounter for closed fracture: Secondary | ICD-10-CM

## 2015-08-09 LAB — CBC
HCT: 28.4 % — ABNORMAL LOW (ref 35.0–47.0)
HEMOGLOBIN: 9.3 g/dL — AB (ref 12.0–16.0)
MCH: 27.6 pg (ref 26.0–34.0)
MCHC: 32.8 g/dL (ref 32.0–36.0)
MCV: 84.2 fL (ref 80.0–100.0)
Platelets: 113 10*3/uL — ABNORMAL LOW (ref 150–440)
RBC: 3.38 MIL/uL — AB (ref 3.80–5.20)
RDW: 15.6 % — ABNORMAL HIGH (ref 11.5–14.5)
WBC: 15.8 10*3/uL — ABNORMAL HIGH (ref 3.6–11.0)

## 2015-08-09 LAB — BASIC METABOLIC PANEL
Anion gap: 7 (ref 5–15)
BUN: 12 mg/dL (ref 6–20)
CHLORIDE: 109 mmol/L (ref 101–111)
CO2: 24 mmol/L (ref 22–32)
Calcium: 8.9 mg/dL (ref 8.9–10.3)
Creatinine, Ser: 0.74 mg/dL (ref 0.44–1.00)
GFR calc Af Amer: >60 mL/min (ref >60)
GFR calc non Af Amer: >60 mL/min (ref >60)
GLUCOSE: 99 mg/dL (ref 65–99)
POTASSIUM: 3.7 mmol/L (ref 3.5–5.1)
Sodium: 140 mmol/L (ref 135–145)

## 2015-08-09 LAB — PROTIME-INR
INR: 1.09
Prothrombin Time: 14.3 seconds (ref 11.4–15.0)

## 2015-08-09 LAB — SEDIMENTATION RATE: Sed Rate: 60 mm/hr — ABNORMAL HIGH (ref 0–30)

## 2015-08-09 MED ORDER — ONDANSETRON HCL 4 MG/2ML IJ SOLN
INTRAMUSCULAR | Status: AC
  Filled 2015-08-09: qty 2

## 2015-08-09 MED ORDER — HYDROMORPHONE HCL 1 MG/ML IJ SOLN
0.5000 mg | Freq: Once | INTRAMUSCULAR | Status: AC
  Administered 2015-08-09: 0.5 mg via INTRAVENOUS
  Filled 2015-08-09: qty 1

## 2015-08-09 MED ORDER — SODIUM CHLORIDE 0.9 % IV BOLUS (SEPSIS)
1000.0000 mL | Freq: Once | INTRAVENOUS | Status: AC
  Administered 2015-08-09: 1000 mL via INTRAVENOUS

## 2015-08-09 MED ORDER — LEVOFLOXACIN IN D5W 750 MG/150ML IV SOLN
750.0000 mg | Freq: Once | INTRAVENOUS | Status: AC
  Administered 2015-08-09: 750 mg via INTRAVENOUS
  Filled 2015-08-09 (×2): qty 150

## 2015-08-09 MED ORDER — SODIUM CHLORIDE 0.9 % IV BOLUS (SEPSIS): 1000.0000 mL | Freq: Once | INTRAVENOUS | Status: DC

## 2015-08-09 MED ORDER — ONDANSETRON HCL 4 MG/2ML IJ SOLN
4.0000 mg | Freq: Once | INTRAMUSCULAR | Status: AC
  Administered 2015-08-09: 4 mg via INTRAVENOUS

## 2015-08-09 NOTE — H&P (Signed)
[facility] [place] NAME: Charlene Williams    MR#:  [id number]  DATE OF BIRTH:  11/18/1955  DATE OF ADMISSION:  08/09/2015  PRIMARY CARE PHYSICIAN: Kasandra Knudsen, NP   REQUESTING/REFERRING PHYSICIAN: Mariea Clonts, MD  CHIEF COMPLAINT:   Chief Complaint  Patient presents with  . Ankle Pain  . Leg Swelling    HISTORY OF PRESENT ILLNESS:  Charlene Williams  is a 60 y.o. female who presents with Pain and swelling in her foot. She states that she's had this pain and swelling for a couple of weeks now. She was initially diagnosed with a bad strain. However, it was not getting better. She also had a fever today to 103. Here in the ED sign of leukocytosis, she was tachycardic, and she has a seems to be pneumonia on her chest x-ray. She does have a history of lung cancer, currently being treated with chemotherapy. Hospitalists were called for admission.  PAST MEDICAL HISTORY:   Past Medical History  Diagnosis Date  . Diabetes mellitus without complication ([place])   . Sleep apnea   . Hypertension   . Aneurysm ([place])   . Hiatal hernia   . COPD (chronic obstructive pulmonary disease) ([place])     on home oxygen  . Illicit drug use   . Migraines   . Hypercholesteremia   . Depression   . Spine disorder   . Family history of chronic pain 01/17/2015  . Lung cancer ([place])     adenocarcinoma left lung    PAST SURGICAL HISTORY:   Past Surgical History  Procedure Laterality Date  . Abdominal hysterectomy    . Hernia repair    . Eye surgery    . Video assisted thoracoscopy (vats)/thorocotomy Left 07/14/2015    Procedure: VIDEO ASSISTED THORACOSCOPY (VATS), pleural biopsy;  Surgeon: Nestor Lewandowsky, MD;  Location: [facility] ORS;  Service: General;  Laterality: Left;  . Portacath placement Left 07/14/2015    Procedure: INSERTION PORT-A-CATH;  Surgeon: Nestor Lewandowsky, MD;  Location: [facility] ORS;  Service: General;  Laterality: Left;  . Removal of pleural drainage  catheter N/A 07/18/2015    Procedure: REMOVAL OF PLEURAL DRAINAGE CATHETER;  Surgeon: Nestor Lewandowsky, MD;  Location: [facility] ORS;  Service: Thoracic;  Laterality: N/A;    SOCIAL HISTORY:   Social History  Substance Use Topics  . Smoking status: Former Smoker -- 1.50 packs/day for 45 years    Types: Cigarettes    Quit date: 04/29/2015  . Smokeless tobacco: Not on file     Comment: Quit 2 months ago  . Alcohol Use: No    FAMILY HISTORY:   Family History  Problem Relation Age of Onset  . Heart disease Mother   . Breast cancer Sister     DRUG ALLERGIES:   Allergies  Allergen Reactions  . Ampicillin Hives and Other (See Comments)    Has patient had a PCN reaction causing immediate rash, facial/tongue/throat swelling, SOB or lightheadedness with hypotension: No Has patient had a PCN reaction causing severe rash involving mucus membranes or skin necrosis: No Has patient had a PCN reaction that required hospitalization No Has patient had a PCN reaction occurring within the last 10 years: Yes If all of the above answers are "NO", then may proceed with Cephalosporin use.  Marland Kitchen Penicillins Hives and Other (See Comments)    Has patient had a PCN reaction causing immediate rash, facial/tongue/throat swelling, SOB or lightheadedness with hypotension: No Has patient had a PCN  reaction causing severe rash involving mucus membranes or skin necrosis: No Has patient had a PCN reaction that required hospitalization No Has patient had a PCN reaction occurring within the last 10 years: Yes If all of the above answers are "NO", then may proceed with Cephalosporin use.    MEDICATIONS AT HOME:   Prior to Admission medications   Medication Sig Start Date End Date Taking? Authorizing Provider  alendronate (FOSAMAX) 70 MG tablet Take 70 mg by mouth once a week. Pt takes on Tuesday.   Take with a full glass of water on an empty stomach.   Yes Historical Provider, MD  apixaban (ELIQUIS) 5 MG TABS tablet  Take 1 tablet (5 mg total) by mouth 2 (two) times daily. 07/30/15  Yes Henreitta Leber, MD  cetirizine (ZYRTEC) 10 MG tablet Take 10 mg by mouth at bedtime.   Yes Historical Provider, MD  dexamethasone (DECADRON) 4 MG tablet Take 4 mg by mouth every 12 (twelve) hours. Pt is to start the day before chemo and take for three days.   Yes Historical Provider, MD  DULoxetine (CYMBALTA) 30 MG capsule Take 90 mg by mouth daily.    Yes Historical Provider, MD  enalapril (VASOTEC) 10 MG tablet Take 10 mg by mouth daily.    Yes Historical Provider, MD  fluticasone (FLOVENT HFA) 220 MCG/ACT inhaler Inhale 1 puff into the lungs 2 (two) times daily.   Yes Historical Provider, MD  folic acid (FOLVITE) 1 MG tablet Take 1 mg by mouth daily.   Yes Historical Provider, MD  lidocaine-prilocaine (EMLA) cream Apply 1 application topically as needed (prior to accessing port).   Yes Historical Provider, MD  loratadine (CLARITIN) 10 MG tablet Take 10 mg by mouth daily.   Yes Historical Provider, MD  magnesium oxide (MAG-OX) 400 (241.3 Mg) MG tablet Take 400 mg by mouth at bedtime.   Yes Historical Provider, MD  metFORMIN (GLUCOPHAGE-XR) 750 MG 24 hr tablet Take 750 mg by mouth daily with breakfast.   Yes Historical Provider, MD  metoprolol tartrate (LOPRESSOR) 25 MG tablet Take 1 tablet (25 mg total) by mouth 2 (two) times daily. 06/27/15  Yes Bettey Costa, MD  mirtazapine (REMERON) 45 MG tablet Take 45 mg by mouth at bedtime.   Yes Historical Provider, MD  omeprazole (PRILOSEC) 20 MG capsule Take 20 mg by mouth daily before breakfast.    Yes Historical Provider, MD  ondansetron (ZOFRAN) 8 MG tablet Take 8 mg by mouth every 8 (eight) hours as needed for nausea or vomiting.   Yes Historical Provider, MD  oxyCODONE-acetaminophen (ROXICET) 5-325 MG tablet Take 1 tablet by mouth every 6 (six) hours as needed for severe pain. 07/28/15  Yes Cammie Sickle, MD  prochlorperazine (COMPAZINE) 10 MG tablet Take 1 tablet (10 mg total) by  mouth every 6 (six) hours as needed for nausea or vomiting. 07/29/15  Yes Carrie Mew, MD  promethazine (PHENERGAN) 25 MG suppository Place 1 suppository (25 mg total) rectally every 6 (six) hours as needed for nausea or vomiting. 07/30/15  Yes Henreitta Leber, MD  simvastatin (ZOCOR) 20 MG tablet Take 20 mg by mouth at bedtime.    Yes Historical Provider, MD  traZODone (DESYREL) 150 MG tablet Take 150 mg by mouth at bedtime.   Yes Historical Provider, MD    REVIEW OF SYSTEMS:  Review of Systems  Constitutional: Positive for fever. Negative for chills, weight loss and malaise/fatigue.  HENT: Negative for ear pain, hearing loss and  tinnitus.   Eyes: Negative for blurred vision, double vision, pain and redness.  Respiratory: Positive for cough. Negative for hemoptysis and shortness of breath.   Cardiovascular: Negative for chest pain, palpitations, orthopnea and leg swelling.  Gastrointestinal: Negative for nausea, vomiting, abdominal pain, diarrhea and constipation.  Genitourinary: Negative for dysuria, frequency and hematuria.  Musculoskeletal: Positive for joint pain (Foot pain). Negative for back pain and neck pain.  Skin:       No acne, rash, or lesions  Neurological: Negative for dizziness, tremors, focal weakness and weakness.  Endo/Heme/Allergies: Negative for polydipsia. Does not bruise/bleed easily.  Psychiatric/Behavioral: Negative for depression. The patient is not nervous/anxious and does not have insomnia.      VITAL SIGNS:   Filed Vitals:   08/09/15 1638 08/09/15 2152 08/09/15 2330  BP: 179/94  171/89  Pulse: 110  114  Temp: 98.9 F (37.2 C) 98.7 F (37.1 C)   TempSrc: Oral Oral   Resp: 18  18  Height: '5\' 3"'$  (1.6 m)    Weight: 75.297 kg (166 lb)    SpO2: 98%  98%   Wt Readings from Last 3 Encounters:  08/09/15 75.297 kg (166 lb)  07/29/15 71.668 kg (158 lb)  07/27/15 74.5 kg (164 lb 3.9 oz)    PHYSICAL EXAMINATION:  Physical Exam  Vitals  reviewed. Constitutional: She is oriented to person, place, and time. She appears well-developed and well-nourished. No distress.  HENT:  Head: Normocephalic and atraumatic.  Mouth/Throat: Oropharynx is clear and moist.  Eyes: Conjunctivae and EOM are normal. Pupils are equal, round, and reactive to light. No scleral icterus.  Neck: Normal range of motion. Neck supple. No JVD present. No thyromegaly present.  Cardiovascular: Normal rate, regular rhythm and intact distal pulses.  Exam reveals no gallop and no friction rub.   No murmur heard. Respiratory: Effort normal and breath sounds normal. No respiratory distress. She has no wheezes. She has no rales.  GI: Soft. Bowel sounds are normal. She exhibits no distension. There is no tenderness.  Musculoskeletal: Normal range of motion. She exhibits no edema.  No arthritis, no gout  Lymphadenopathy:    She has no cervical adenopathy.  Neurological: She is alert and oriented to person, place, and time. No cranial nerve deficit.  No dysarthria, no aphasia  Skin: Skin is warm and dry. No rash noted. No erythema.  Psychiatric: She has a normal mood and affect. Her behavior is normal. Judgment and thought content normal.    LABORATORY PANEL:   CBC  Recent Labs Lab 08/09/15 1759  WBC 15.8*  HGB 9.3*  HCT 28.4*  PLT 113*   ------------------------------------------------------------------------------------------------------------------  Chemistries   Recent Labs Lab 08/09/15 1759  NA 140  K 3.7  CL 109  CO2 24  GLUCOSE 99  BUN 12  CREATININE 0.74  CALCIUM 8.9   ------------------------------------------------------------------------------------------------------------------  Cardiac Enzymes No results for input(s): TROPONINI in the last 168 hours. ------------------------------------------------------------------------------------------------------------------  RADIOLOGY:  Dg Chest 2 View  08/09/2015  CLINICAL DATA:   Fever.  Lung carcinoma EXAM: CHEST  2 VIEW COMPARISON:  Chest radiograph July 18, 2015; chest CT Jul 29, 2015 FINDINGS: Port-A-Cath tip is in the superior vena cava. No pneumothorax. There is left lower lobe consolidation with loculated effusion on the left. Right lung is clear. Heart is upper normal in size. The pulmonary vascularity is normal. Soft tissue fullness in the left hilum is suspicious for adenopathy, better seen on recent CT. IMPRESSION: Persistent loculated effusion on the left with  left lower lobe consolidation. Mild left hilar fullness consistent with adenopathy, better seen on CT. Right lung clear. Stable cardiac silhouette. No pneumothorax. Electronically Signed   By: Lowella Grip III M.D.   On: 08/09/2015 19:10   Dg Ankle Complete Right  08/09/2015  CLINICAL DATA:  Pain and swelling. Injury 3 weeks prior. Lung carcinoma EXAM: RIGHT ANKLE - COMPLETE 3+ VIEW COMPARISON:  None. FINDINGS: Frontal, oblique, and lateral views were obtained. There is generalized soft tissue swelling. There is a tiny avulsion along the dorsal distal talus. No other evidence suggesting fracture. No joint effusion. The ankle mortise appears intact. No blastic or lytic bone lesions. No appreciable arthropathy. IMPRESSION: Tiny avulsion along the dorsal distal talus. Soft tissue swelling. Ankle mortise appears intact. Electronically Signed   By: Lowella Grip III M.D.   On: 08/09/2015 19:08   US Venous Img Lower Bilateral  08/09/2015  CLINICAL DATA:  60 year old female with bilateral lower extremity swelling EXAM: BILATERAL LOWER EXTREMITY VENOUS DOPPLER ULTRASOUND TECHNIQUE: Gray-scale sonography with graded compression, as well as color Doppler and duplex ultrasound were performed to evaluate the lower extremity deep venous systems from the level of the common femoral vein and including the common femoral, femoral, profunda femoral, popliteal and calf veins including the posterior tibial, peroneal and  gastrocnemius veins when visible. The superficial great saphenous vein was also interrogated. Spectral Doppler was utilized to evaluate flow at rest and with distal augmentation maneuvers in the common femoral, femoral and popliteal veins. COMPARISON:  Left lower extremity venous Ultrasound dated 07/29/2015 FINDINGS: RIGHT LOWER EXTREMITY Common Femoral Vein: No evidence of thrombus. Normal compressibility, respiratory phasicity and response to augmentation. Saphenofemoral Junction: No evidence of thrombus. Normal compressibility and flow on color Doppler imaging. Profunda Femoral Vein: No evidence of thrombus. Normal compressibility and flow on color Doppler imaging. Femoral Vein: No evidence of thrombus. Normal compressibility, respiratory phasicity and response to augmentation. Popliteal Vein: No evidence of thrombus. Normal compressibility, respiratory phasicity and response to augmentation. Calf Veins: No evidence of thrombus. Normal compressibility and flow on color Doppler imaging. Superficial Great Saphenous Vein: No evidence of thrombus. Normal compressibility and flow on color Doppler imaging. Venous Reflux:  None. Other Findings:  None. LEFT LOWER EXTREMITY Common Femoral Vein: No evidence of thrombus. Normal compressibility, respiratory phasicity and response to augmentation. Saphenofemoral Junction: No evidence of thrombus. Normal compressibility and flow on color Doppler imaging. Profunda Femoral Vein: No evidence of thrombus. Normal compressibility and flow on color Doppler imaging. Femoral Vein: No evidence of thrombus. Normal compressibility, respiratory phasicity and response to augmentation. Popliteal Vein: No evidence of thrombus. Normal compressibility, respiratory phasicity and response to augmentation. Calf Veins: No evidence of thrombus. Normal compressibility and flow on color Doppler imaging. Superficial Great Saphenous Vein: No evidence of thrombus. Normal compressibility and flow on color  Doppler imaging. Venous Reflux:  None. Other Findings: There is a 2.7 x 1.0 x 2.2 cm left popliteal fossa cyst. IMPRESSION: No evidence of deep venous thrombosis in the bilateral lower extremities. Electronically Signed   By: Anner Crete M.D.   On: 08/09/2015 19:37   Dg Foot Complete Right  08/09/2015  CLINICAL DATA:  60 year old female with fever. Patient presents with right ankle pain and swelling. EXAM: RIGHT FOOT COMPLETE - 3+ VIEW COMPARISON:  Right ankle radiographs dated 08/09/2015 FINDINGS: There is no acute fracture or dislocation. The bones are osteopenic. The ankle mortise is intact. There is no bony erosion or periosteal elevation. There is diffuse soft tissue swelling  of the foot primarily over the mid and forefoot. No radiopaque foreign object identified. There is no soft tissue gas. IMPRESSION: Diffuse soft tissue swelling of the foot. No acute osseous pathology. Electronically Signed   By: Anner Crete M.D.   On: 08/09/2015 19:12    EKG:   Orders placed or performed during the hospital encounter of 08/09/15  . ED EKG  . ED EKG  . EKG 12-Lead  . EKG 12-Lead    IMPRESSION AND PLAN:  Principal Problem:   Sepsis ([place]) - patient is hemodynamically stable, sepsis comes from healthcare associated pneumonia, antibiotics and pan cultures ordered in ED. Continue these on admission. Active Problems:   Pulmonary embolism ([place]) - diagnosed a couple of weeks ago. Patient is on eliquis, we'll continue this   HCAP (healthcare-associated pneumonia) - IV antibiotics and cultures as above, when necessary duo nebs.   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 Management plans discussed with the patient and/or family.  DVT PROPHYLAXIS: SubQ lovenox  GI PROPHYLAXIS: None  ADMISSION STATUS: Inpatient  CODE STATUS: Full Code Status History    Date Active Date Inactive Code Status Order ID Comments User Context   07/29/2015  5:56 PM 07/30/2015  4:42 PM Full Code [id number]  Gladstone Lighter, MD Inpatient   07/11/2015  5:25 PM 07/19/2015  6:21 PM Full Code [id number]  Henreitta Leber, MD Inpatient   06/25/2015  6:35 PM 06/27/2015  4:00 PM Full Code [id number]  Theodoro Grist, MD ED      TOTAL TIME TAKING CARE OF THIS PATIENT: 45 minutes.    Jelena Malicoat [place] 08/09/2015, 11:40 PM  [facility]  Office  [phone]  CC: Primary care physician; Kasandra Knudsen, NP

## 2015-08-09 NOTE — ED Notes (Signed)
Pt presents to ED with reports of right ankle pain and swelling. Pt states was seen here three weeks ago and diagnosed with ankle sprain. Pt states was evaluated for blood clot but did not have one. Pt states pain is not getting any better.

## 2015-08-09 NOTE — ED Notes (Signed)
Patient transported to X-ray 

## 2015-08-09 NOTE — ED Provider Notes (Signed)
[facility] Emergency Department Provider Note  ____________________________________________  Time seen: Approximately 6:38 PM  I have reviewed the triage vital signs and the nursing notes.   HISTORY  Chief Complaint Ankle Pain and Leg Swelling    HPI Charlene Williams is a 60 y.o. female with lung cancer on chemotherapy, last chemotherapy 3 weeks ago, PE on Eliquis, presenting with persistent right foot and ankle pain.The patient was evaluated 5/1 after a traumatic rolling of her ankle and was diagnosed with a sprain. Since then, her pain has not gotten better nor has it gotten worse. She has tried elevating her ankle but continues to have persistent right lower extremity swelling. She also has now left ankle swelling which is new. She has difficulty bearing weight on this joint. She also has some pain under the arch of the right foot. She denies any calf pain, chest pain or shortness of breath. Today, she had a fever to 103.0, without any associated cough or cold symptoms, urinary symptoms, nausea vomiting or diarrhea, abdominal pain.   Past Medical History  Diagnosis Date  . Diabetes mellitus without complication ([place])   . Sleep apnea   . Hypertension   . Aneurysm ([place])   . Hiatal hernia   . COPD (chronic obstructive pulmonary disease) ([place])     on home oxygen  . Illicit drug use   . Migraines   . Hypercholesteremia   . Depression   . Spine disorder   . Family history of chronic pain 01/17/2015  . Lung cancer [facility])     adenocarcinoma left lung    Patient Active Problem List   Diagnosis Date Noted  . Pulmonary embolism ([place]) 07/29/2015  . Primary cancer of left lower lobe of lung ([place]) 07/19/2015  . Primary cancer of left lung metastatic to other site ([place]) 07/19/2015  . Pleural effusion   . Recurrent left pleural effusion   . COPD exacerbation ([place]) 07/11/2015  . SIRS (systemic inflammatory response syndrome) ([place]) 06/25/2015  . Pleural  effusion, left 06/25/2015  . Left lower lobe pneumonia 06/25/2015  . Hypoxemia 06/25/2015  . Hyponatremia 06/25/2015  . Elevated troponin 06/25/2015  . Generalized weakness 06/25/2015  . Chronic lower extremity pain (Bilateral) 05/19/2015  . Substance use disorder (SUB Risk: Very high) 05/19/2015  . Myofascial pain 05/15/2015  . Muscle spasms of lower extremity 05/15/2015  . Chronic sacroiliac joint pain (Location of Primary Source of Pain) (Bilateral) (R>L) 05/15/2015  . Neurogenic pain 05/15/2015  . Avitaminosis D 05/15/2015  . Chronic pain syndrome 01/17/2015  . Chronic pain 01/17/2015  . Lumbar facet syndrome (Location of Primary Source of Pain) (Bilateral) (R>L) 01/17/2015  . Chronic low back pain (Location of Primary Source of Pain) (Bilateral) (R>L) 01/17/2015  . Chronic radicular lumbar pain (Bilateral) 01/17/2015  . Osteoarthritis of spine with radiculopathy, lumbar region 01/17/2015  . Polysubstance abuse 01/17/2015  . Lumbar spondylosis 01/17/2015  . Lumbar facet arthropathy (Bilateral) 01/17/2015  . Magnesium deficiency 01/17/2015  . Lumbar spinal stenosis (Central Spinal Stenosis) (Severe) (L3-4 and L4-5) 01/17/2015  . Family history of chronic pain 01/17/2015  . Nicotine dependence 01/17/2015  . Smoker 01/17/2015  . Tobacco abuse 01/17/2015  . Essential hypertension, benign 01/17/2015  . Chronic obstructive pulmonary disease (COPD) ([place]) 01/17/2015  . Emphysema of lung ([place]) 01/17/2015  . History of bronchitis 01/17/2015  . History of shortness of breath 01/17/2015  . Generalized anxiety disorder 01/17/2015  . Seizure disorder ([place]) 01/17/2015  . Depression 01/17/2015  .  History of panic attacks 01/17/2015  . Non-insulin dependent type 2 diabetes mellitus ([place]) 01/17/2015    Past Surgical History  Procedure Laterality Date  . Abdominal hysterectomy    . Hernia repair    . Eye surgery    . Video assisted thoracoscopy (vats)/thorocotomy Left 07/14/2015     Procedure: VIDEO ASSISTED THORACOSCOPY (VATS), pleural biopsy;  Surgeon: Nestor Lewandowsky, MD;  Location: [facility] ORS;  Service: General;  Laterality: Left;  . Portacath placement Left 07/14/2015    Procedure: INSERTION PORT-A-CATH;  Surgeon: Nestor Lewandowsky, MD;  Location: [facility] ORS;  Service: General;  Laterality: Left;  . Removal of pleural drainage catheter N/A 07/18/2015    Procedure: REMOVAL OF PLEURAL DRAINAGE CATHETER;  Surgeon: Nestor Lewandowsky, MD;  Location: [facility] ORS;  Service: Thoracic;  Laterality: N/A;    Current Outpatient Rx  Name  Route  Sig  Dispense  Refill  . alendronate (FOSAMAX) 70 MG tablet   Oral   Take 70 mg by mouth once a week. Pt takes on Tuesday.   Take with a full glass of water on an empty stomach.         Marland Kitchen apixaban (ELIQUIS) 5 MG TABS tablet   Oral   Take 1 tablet (5 mg total) by mouth 2 (two) times daily.   60 tablet   1     Please take 10 mg BID X 7 days and then switch to  ...   . cetirizine (ZYRTEC) 10 MG tablet   Oral   Take 10 mg by mouth at bedtime.         Marland Kitchen dexamethasone (DECADRON) 4 MG tablet      One pill every 12 hour x3 days; start the day for before chemo.   30 tablet   0   . DULoxetine (CYMBALTA) 30 MG capsule   Oral   Take 90 mg by mouth daily.          . enalapril (VASOTEC) 10 MG tablet   Oral   Take 10 mg by mouth daily.          . fluticasone (FLOVENT HFA) 220 MCG/ACT inhaler   Inhalation   Inhale 1 puff into the lungs 2 (two) times daily.         . folic acid (FOLVITE) 1 MG tablet   Oral   Take 1 mg by mouth daily.         Marland Kitchen lidocaine-prilocaine (EMLA) cream   Topical   Apply 1 application topically as needed.   30 g   6   . loratadine (CLARITIN) 10 MG tablet   Oral   Take 10 mg by mouth daily.         . magnesium oxide (MAG-OX) 400 (241.3 Mg) MG tablet   Oral   Take 400 mg by mouth at bedtime.         . metFORMIN (GLUCOPHAGE-XR) 750 MG 24 hr tablet   Oral   Take 750 mg by mouth daily with breakfast.          . metoprolol tartrate (LOPRESSOR) 25 MG tablet   Oral   Take 1 tablet (25 mg total) by mouth 2 (two) times daily.   60 tablet   0   . mirtazapine (REMERON) 45 MG tablet   Oral   Take 45 mg by mouth at bedtime.         Marland Kitchen omeprazole (PRILOSEC) 20 MG capsule   Oral   Take 20 mg by mouth daily  before breakfast.          . ondansetron (ZOFRAN) 8 MG tablet   Oral   Take 1 tablet (8 mg total) by mouth every 8 (eight) hours as needed for nausea or vomiting (start 3 days; after chemo).   40 tablet   0   . oxyCODONE-acetaminophen (ROXICET) 5-325 MG tablet   Oral   Take 1 tablet by mouth every 6 (six) hours as needed for severe pain.   30 tablet   0   . prochlorperazine (COMPAZINE) 10 MG tablet   Oral   Take 1 tablet (10 mg total) by mouth every 6 (six) hours as needed for nausea or vomiting.   30 tablet   0   . promethazine (PHENERGAN) 25 MG suppository   Rectal   Place 1 suppository (25 mg total) rectally every 6 (six) hours as needed for nausea or vomiting.   12 each   0   . simvastatin (ZOCOR) 20 MG tablet   Oral   Take 20 mg by mouth at bedtime.          . traZODone (DESYREL) 150 MG tablet   Oral   Take 150 mg by mouth at bedtime.           Allergies Ampicillin and Penicillins  Family History  Problem Relation Age of Onset  . Heart disease Mother   . Breast cancer Sister     Social History Social History  Substance Use Topics  . Smoking status: Former Smoker -- 1.50 packs/day for 45 years    Types: Cigarettes    Quit date: 04/29/2015  . Smokeless tobacco: None     Comment: Quit 2 months ago  . Alcohol Use: No    Review of Systems Constitutional: Positive fever. No lightheadedness or syncope. Eyes: No visual changes. ENT: No sore throat. No congestion or rhinorrhea. Cardiovascular: Denies chest pain. Denies palpitations. Respiratory: Denies shortness of breath.  No cough. Gastrointestinal: No abdominal pain.  No nausea, no vomiting.   No diarrhea.  No constipation. Genitourinary: Negative for dysuria. Musculoskeletal: Negative for back pain. Positive right ankle and foot pain. Skin: Negative for rash. Neurological: Negative for headaches. No focal numbness, tingling or weakness.   10-point ROS otherwise negative.  ____________________________________________   PHYSICAL EXAM:  VITAL SIGNS: ED Triage Vitals  Enc Vitals Group     BP 08/09/15 1638 179/94 mmHg     Pulse Rate 08/09/15 1638 110     Resp 08/09/15 1638 18     Temp 08/09/15 1638 98.9 F (37.2 C)     Temp Source 08/09/15 1638 Oral     SpO2 08/09/15 1638 98 %     Weight 08/09/15 1638 166 lb (75.297 kg)     Height 08/09/15 1638 '5\' 3"'$  (1.6 m)     Head Cir --      Peak Flow --      Pain Score 08/09/15 1639 8     Pain Loc --      Pain Edu? --      Excl. in [place]? --     Constitutional: Alert and oriented. Chronically ill appearing but in no acute distress. Answers questions appropriately. Eyes: Conjunctivae are normal.  EOMI. No scleral icterus. Head: Atraumatic. Nose: No congestion/rhinnorhea. Mouth/Throat: Mucous membranes are moist.  Neck: No stridor.  Supple.  No JVD. Cardiovascular: Normal rate, regular rhythm. No murmurs, rubs or gallops.  Respiratory: Normal respiratory effort.  No accessory muscle use or retractions. Lungs CTAB.  No wheezes, rales or ronchi. Gastrointestinal: Soft, nontender and nondistended.  No guarding or rebound.  No peritoneal signs. Musculoskeletal: Positive symmetric bilateral lower extremity edema that is mildly pitting around the feet, ankles and distal tibial shafts. Significant tenderness to palpation over the lateral and medial malleoli on the right, as well as under the arch of the right foot. The skin is intact and does not have any overlying erythema or skin break. Normal DP and PT pulses bilaterally. Normal sensation to light touch bilaterally. Neurologic:  A&Ox3.  Speech is clear.  Face and smile are symmetric.   EOMI.  Moves all extremities well. Skin:  Skin is warm, dry and intact. No rash noted. Psychiatric: Mood and affect are normal. Speech and behavior are normal.  Normal judgement.  ____________________________________________   LABS (all labs ordered are listed, but only abnormal results are displayed)  Labs Reviewed  CBC - Abnormal; Notable for the following:    WBC 15.8 (*)    RBC 3.38 (*)    Hemoglobin 9.3 (*)    HCT 28.4 (*)    RDW 15.6 (*)    Platelets 113 (*)    All other components within normal limits  SEDIMENTATION RATE - Abnormal; Notable for the following:    Sed Rate 60 (*)    All other components within normal limits  CULTURE, BLOOD (ROUTINE X 2)  CULTURE, BLOOD (ROUTINE X 2)  URINE CULTURE  BASIC METABOLIC PANEL  PROTIME-INR  C-REACTIVE PROTEIN  URINALYSIS COMPLETEWITH MICROSCOPIC ([place])   ____________________________________________  EKG  ED ECG REPORT I, Eula Listen, the attending physician, personally viewed and interpreted this ECG.   Date: 08/09/2015  EKG Time: 2135  Rate: 111  Rhythm: sinus tachycardia  Axis: normal  Intervals:none  ST&T Change: no STEMI  ____________________________________________  RADIOLOGY  Dg Chest 2 View  08/09/2015  CLINICAL DATA:  Fever.  Lung carcinoma EXAM: CHEST  2 VIEW COMPARISON:  Chest radiograph July 18, 2015; chest CT Jul 29, 2015 FINDINGS: Port-A-Cath tip is in the superior vena cava. No pneumothorax. There is left lower lobe consolidation with loculated effusion on the left. Right lung is clear. Heart is upper normal in size. The pulmonary vascularity is normal. Soft tissue fullness in the left hilum is suspicious for adenopathy, better seen on recent CT. IMPRESSION: Persistent loculated effusion on the left with left lower lobe consolidation. Mild left hilar fullness consistent with adenopathy, better seen on CT. Right lung clear. Stable cardiac silhouette. No pneumothorax. Electronically Signed    By: Lowella Grip III M.D.   On: 08/09/2015 19:10   Dg Ankle Complete Right  08/09/2015  CLINICAL DATA:  Pain and swelling. Injury 3 weeks prior. Lung carcinoma EXAM: RIGHT ANKLE - COMPLETE 3+ VIEW COMPARISON:  None. FINDINGS: Frontal, oblique, and lateral views were obtained. There is generalized soft tissue swelling. There is a tiny avulsion along the dorsal distal talus. No other evidence suggesting fracture. No joint effusion. The ankle mortise appears intact. No blastic or lytic bone lesions. No appreciable arthropathy. IMPRESSION: Tiny avulsion along the dorsal distal talus. Soft tissue swelling. Ankle mortise appears intact. Electronically Signed   By: Lowella Grip III M.D.   On: 08/09/2015 19:08   US Venous Img Lower Bilateral  08/09/2015  CLINICAL DATA:  60 year old female with bilateral lower extremity swelling EXAM: BILATERAL LOWER EXTREMITY VENOUS DOPPLER ULTRASOUND TECHNIQUE: Gray-scale sonography with graded compression, as well as color Doppler and duplex ultrasound were performed to evaluate the lower extremity deep venous  systems from the level of the common femoral vein and including the common femoral, femoral, profunda femoral, popliteal and calf veins including the posterior tibial, peroneal and gastrocnemius veins when visible. The superficial great saphenous vein was also interrogated. Spectral Doppler was utilized to evaluate flow at rest and with distal augmentation maneuvers in the common femoral, femoral and popliteal veins. COMPARISON:  Left lower extremity venous Ultrasound dated 07/29/2015 FINDINGS: RIGHT LOWER EXTREMITY Common Femoral Vein: No evidence of thrombus. Normal compressibility, respiratory phasicity and response to augmentation. Saphenofemoral Junction: No evidence of thrombus. Normal compressibility and flow on color Doppler imaging. Profunda Femoral Vein: No evidence of thrombus. Normal compressibility and flow on color Doppler imaging. Femoral Vein: No  evidence of thrombus. Normal compressibility, respiratory phasicity and response to augmentation. Popliteal Vein: No evidence of thrombus. Normal compressibility, respiratory phasicity and response to augmentation. Calf Veins: No evidence of thrombus. Normal compressibility and flow on color Doppler imaging. Superficial Great Saphenous Vein: No evidence of thrombus. Normal compressibility and flow on color Doppler imaging. Venous Reflux:  None. Other Findings:  None. LEFT LOWER EXTREMITY Common Femoral Vein: No evidence of thrombus. Normal compressibility, respiratory phasicity and response to augmentation. Saphenofemoral Junction: No evidence of thrombus. Normal compressibility and flow on color Doppler imaging. Profunda Femoral Vein: No evidence of thrombus. Normal compressibility and flow on color Doppler imaging. Femoral Vein: No evidence of thrombus. Normal compressibility, respiratory phasicity and response to augmentation. Popliteal Vein: No evidence of thrombus. Normal compressibility, respiratory phasicity and response to augmentation. Calf Veins: No evidence of thrombus. Normal compressibility and flow on color Doppler imaging. Superficial Great Saphenous Vein: No evidence of thrombus. Normal compressibility and flow on color Doppler imaging. Venous Reflux:  None. Other Findings: There is a 2.7 x 1.0 x 2.2 cm left popliteal fossa cyst. IMPRESSION: No evidence of deep venous thrombosis in the bilateral lower extremities. Electronically Signed   By: Anner Crete M.D.   On: 08/09/2015 19:37   Dg Foot Complete Right  08/09/2015  CLINICAL DATA:  60 year old female with fever. Patient presents with right ankle pain and swelling. EXAM: RIGHT FOOT COMPLETE - 3+ VIEW COMPARISON:  Right ankle radiographs dated 08/09/2015 FINDINGS: There is no acute fracture or dislocation. The bones are osteopenic. The ankle mortise is intact. There is no bony erosion or periosteal elevation. There is diffuse soft tissue  swelling of the foot primarily over the mid and forefoot. No radiopaque foreign object identified. There is no soft tissue gas. IMPRESSION: Diffuse soft tissue swelling of the foot. No acute osseous pathology. Electronically Signed   By: Anner Crete M.D.   On: 08/09/2015 19:12    ____________________________________________   PROCEDURES  Procedure(s) performed: None  Critical Care performed: No ____________________________________________   INITIAL IMPRESSION / ASSESSMENT AND PLAN / ED COURSE  Pertinent labs & imaging results that were available during my care of the patient were reviewed by me and considered in my medical decision making (see chart for details).  60 y.o. female on chemotherapy for lung cancer, on [place] for PE, presenting with persistent right ankle and foot pain for the last 3 weeks. The patient is immunocompromised, and did have a fever to 103 at home. I am concerned that she may have an underlying infection, which may or may not be related to the right ankle. While the patient is uncomfortable, I am not sure that the right ankle is the cause of her infection or that she has a septic joint. I would also consider  untreated gout, although she has no history of this. For the right ankle, will get a repeat x-ray to look for any sign of demineralization of the bone, a sedimentation rate and a CRP, and a white blood cell count. We'll repeat ultrasounds to look for DVT. In addition we will get a chest x-ray, blood cultures and a urinalysis to look for other causes of the patient's fever to 103.  ----------------------------------------- 9:19 PM on 08/09/2015 -----------------------------------------  The patient's imaging shows no DVT, she has a small talus fracture that was not initially seen on her first film several weeks ago. She does have multiple abnormalities including a loculated effusion in the left lower lobe on her chest x-ray and have treated her empirically  for healthcare associated pneumonia. Even though her oxygenation is normal, we'll plan to admit her to the hospital given that she is on chemotherapy. ____________________________________________  FINAL CLINICAL IMPRESSION(S) / ED DIAGNOSES  Final diagnoses:  Talus fracture, right, closed, initial encounter  Healthcare-associated pneumonia      NEW MEDICATIONS STARTED DURING THIS VISIT:  New Prescriptions   No medications on file     Eula Listen, MD 08/09/15 2143

## 2015-08-10 ENCOUNTER — Ambulatory Visit: Payer: Medicare Other | Admitting: Internal Medicine

## 2015-08-10 ENCOUNTER — Other Ambulatory Visit: Payer: Medicare Other

## 2015-08-10 LAB — BASIC METABOLIC PANEL
ANION GAP: 6 (ref 5–15)
BUN: 11 mg/dL (ref 6–20)
CALCIUM: 8.2 mg/dL — AB (ref 8.9–10.3)
CHLORIDE: 107 mmol/L (ref 101–111)
CO2: 23 mmol/L (ref 22–32)
CREATININE: 0.65 mg/dL (ref 0.44–1.00)
GFR calc non Af Amer: >60 mL/min (ref >60)
Glucose, Bld: 108 mg/dL — ABNORMAL HIGH (ref 65–99)
Potassium: 3.7 mmol/L (ref 3.5–5.1)
SODIUM: 136 mmol/L (ref 135–145)

## 2015-08-10 LAB — URINALYSIS COMPLETE WITH MICROSCOPIC (ARMC ONLY)
BACTERIA UA: NONE SEEN
Bilirubin Urine: NEGATIVE
Glucose, UA: NEGATIVE mg/dL
HGB URINE DIPSTICK: NEGATIVE
Ketones, ur: NEGATIVE mg/dL
LEUKOCYTES UA: NEGATIVE
NITRITE: NEGATIVE
PH: 7 (ref 5.0–8.0)
PROTEIN: NEGATIVE mg/dL
SPECIFIC GRAVITY, URINE: 1.005 (ref 1.005–1.030)

## 2015-08-10 LAB — GLUCOSE, CAPILLARY
GLUCOSE-CAPILLARY: 82 mg/dL (ref 65–99)
GLUCOSE-CAPILLARY: 85 mg/dL (ref 65–99)
Glucose-Capillary: 99 mg/dL (ref 65–99)
Glucose-Capillary: 96 mg/dL (ref 65–99)

## 2015-08-10 LAB — CBC
HCT: 28.1 % — ABNORMAL LOW (ref 35.0–47.0)
Hemoglobin: 9.3 g/dL — ABNORMAL LOW (ref 12.0–16.0)
MCH: 27.6 pg (ref 26.0–34.0)
MCHC: 32.9 g/dL (ref 32.0–36.0)
MCV: 83.8 fL (ref 80.0–100.0)
PLATELETS: 109 10*3/uL — AB (ref 150–440)
RBC: 3.36 MIL/uL — AB (ref 3.80–5.20)
RDW: 15.3 % — ABNORMAL HIGH (ref 11.5–14.5)
WBC: 16.6 10*3/uL — AB (ref 3.6–11.0)

## 2015-08-10 LAB — C-REACTIVE PROTEIN: CRP: 1.8 mg/dL — AB (ref <1.0)

## 2015-08-10 LAB — HEMOGLOBIN A1C: HEMOGLOBIN A1C: 6 % (ref 4.0–6.0)

## 2015-08-10 MED ORDER — BUDESONIDE 0.25 MG/2ML IN SUSP
0.2500 mg | Freq: Two times a day (BID) | RESPIRATORY_TRACT | Status: DC
  Administered 2015-08-10 – 2015-08-12 (×4): 0.25 mg via RESPIRATORY_TRACT
  Filled 2015-08-10 (×4): qty 2

## 2015-08-10 MED ORDER — SIMVASTATIN 20 MG PO TABS
20.0000 mg | ORAL_TABLET | Freq: Every day | ORAL | Status: DC
  Administered 2015-08-10 – 2015-08-11 (×2): 20 mg via ORAL
  Filled 2015-08-10 (×3): qty 1

## 2015-08-10 MED ORDER — VANCOMYCIN HCL IN DEXTROSE 750-5 MG/150ML-% IV SOLN
750.0000 mg | Freq: Two times a day (BID) | INTRAVENOUS | Status: DC
  Administered 2015-08-10 – 2015-08-11 (×3): 750 mg via INTRAVENOUS
  Filled 2015-08-10 (×5): qty 150

## 2015-08-10 MED ORDER — INSULIN ASPART 100 UNIT/ML ~~LOC~~ SOLN
0.0000 [IU] | Freq: Three times a day (TID) | SUBCUTANEOUS | Status: DC
  Administered 2015-08-11: 1 [IU] via SUBCUTANEOUS
  Filled 2015-08-10: qty 1

## 2015-08-10 MED ORDER — OXYCODONE-ACETAMINOPHEN 5-325 MG PO TABS
1.0000 | ORAL_TABLET | Freq: Four times a day (QID) | ORAL | Status: DC | PRN
  Administered 2015-08-10 – 2015-08-12 (×5): 1 via ORAL
  Filled 2015-08-10 (×5): qty 1

## 2015-08-10 MED ORDER — OXYCODONE-ACETAMINOPHEN 5-325 MG PO TABS
1.0000 | ORAL_TABLET | Freq: Four times a day (QID) | ORAL | Status: DC | PRN
  Administered 2015-08-10 (×2): 1 via ORAL
  Filled 2015-08-10 (×2): qty 1

## 2015-08-10 MED ORDER — DULOXETINE HCL 60 MG PO CPEP
90.0000 mg | ORAL_CAPSULE | Freq: Every day | ORAL | Status: DC
  Administered 2015-08-10 – 2015-08-12 (×3): 90 mg via ORAL
  Filled 2015-08-10 (×4): qty 1

## 2015-08-10 MED ORDER — PANTOPRAZOLE SODIUM 40 MG PO TBEC
40.0000 mg | DELAYED_RELEASE_TABLET | Freq: Every day | ORAL | Status: DC
  Administered 2015-08-10 – 2015-08-12 (×3): 40 mg via ORAL
  Filled 2015-08-10 (×3): qty 1

## 2015-08-10 MED ORDER — VANCOMYCIN HCL IN DEXTROSE 750-5 MG/150ML-% IV SOLN
750.0000 mg | Freq: Once | INTRAVENOUS | Status: AC
  Administered 2015-08-10: 750 mg via INTRAVENOUS
  Filled 2015-08-10: qty 150

## 2015-08-10 MED ORDER — HYDRALAZINE HCL 20 MG/ML IJ SOLN
10.0000 mg | Freq: Four times a day (QID) | INTRAMUSCULAR | Status: DC | PRN
  Administered 2015-08-10 (×2): 10 mg via INTRAVENOUS
  Filled 2015-08-10 (×2): qty 1

## 2015-08-10 MED ORDER — ACETAMINOPHEN 650 MG RE SUPP: 650.0000 mg | Freq: Four times a day (QID) | RECTAL | Status: DC | PRN

## 2015-08-10 MED ORDER — MORPHINE SULFATE (PF) 2 MG/ML IV SOLN
2.0000 mg | Freq: Once | INTRAVENOUS | Status: AC
  Administered 2015-08-10: 2 mg via INTRAVENOUS
  Filled 2015-08-10: qty 1

## 2015-08-10 MED ORDER — ONDANSETRON HCL 4 MG PO TABS: 4.0000 mg | ORAL_TABLET | Freq: Four times a day (QID) | ORAL | Status: DC | PRN

## 2015-08-10 MED ORDER — ENALAPRIL MALEATE 10 MG PO TABS
10.0000 mg | ORAL_TABLET | Freq: Every day | ORAL | Status: DC
  Administered 2015-08-10 – 2015-08-12 (×3): 10 mg via ORAL
  Filled 2015-08-10 (×3): qty 1

## 2015-08-10 MED ORDER — TRAZODONE HCL 50 MG PO TABS
150.0000 mg | ORAL_TABLET | Freq: Every day | ORAL | Status: DC
  Administered 2015-08-10 – 2015-08-11 (×2): 150 mg via ORAL
  Filled 2015-08-10 (×2): qty 1

## 2015-08-10 MED ORDER — ACETAMINOPHEN 325 MG PO TABS: 650.0000 mg | ORAL_TABLET | Freq: Four times a day (QID) | ORAL | Status: DC | PRN

## 2015-08-10 MED ORDER — ONDANSETRON HCL 4 MG/2ML IJ SOLN
4.0000 mg | Freq: Four times a day (QID) | INTRAMUSCULAR | Status: DC | PRN
  Administered 2015-08-10 – 2015-08-11 (×2): 4 mg via INTRAVENOUS
  Filled 2015-08-10 (×2): qty 2

## 2015-08-10 MED ORDER — METOPROLOL TARTRATE 25 MG PO TABS
25.0000 mg | ORAL_TABLET | Freq: Two times a day (BID) | ORAL | Status: DC
  Administered 2015-08-10 – 2015-08-12 (×6): 25 mg via ORAL
  Filled 2015-08-10 (×6): qty 1

## 2015-08-10 MED ORDER — SODIUM CHLORIDE 0.9 % IV SOLN
1.0000 g | Freq: Three times a day (TID) | INTRAVENOUS | Status: DC
  Administered 2015-08-10 – 2015-08-12 (×7): 1 g via INTRAVENOUS
  Filled 2015-08-10 (×9): qty 1

## 2015-08-10 MED ORDER — METFORMIN HCL ER 750 MG PO TB24
750.0000 mg | ORAL_TABLET | Freq: Every day | ORAL | Status: DC
  Administered 2015-08-11 – 2015-08-12 (×2): 750 mg via ORAL
  Filled 2015-08-10 (×3): qty 1

## 2015-08-10 MED ORDER — INSULIN ASPART 100 UNIT/ML ~~LOC~~ SOLN: 0.0000 [IU] | Freq: Every day | SUBCUTANEOUS | Status: DC

## 2015-08-10 MED ORDER — MIRTAZAPINE 15 MG PO TABS
45.0000 mg | ORAL_TABLET | Freq: Every day | ORAL | Status: DC
  Administered 2015-08-10 – 2015-08-11 (×2): 45 mg via ORAL
  Filled 2015-08-10 (×2): qty 3

## 2015-08-10 MED ORDER — APIXABAN 5 MG PO TABS
5.0000 mg | ORAL_TABLET | Freq: Two times a day (BID) | ORAL | Status: DC
  Administered 2015-08-10 – 2015-08-12 (×5): 5 mg via ORAL
  Filled 2015-08-10 (×6): qty 1

## 2015-08-10 MED ORDER — IPRATROPIUM-ALBUTEROL 0.5-2.5 (3) MG/3ML IN SOLN
3.0000 mL | RESPIRATORY_TRACT | Status: DC | PRN
  Administered 2015-08-10 – 2015-08-12 (×4): 3 mL via RESPIRATORY_TRACT
  Filled 2015-08-10 (×4): qty 3

## 2015-08-10 MED ORDER — SODIUM CHLORIDE 0.9% FLUSH
3.0000 mL | Freq: Two times a day (BID) | INTRAVENOUS | Status: DC
  Administered 2015-08-10 (×2): 3 mL via INTRAVENOUS

## 2015-08-10 NOTE — Progress Notes (Signed)
Pharmacy Antibiotic Note  Charlene Williams is a 60 y.o. female admitted on 08/09/2015 with pneumonia.  Pharmacy has been consulted for vancomycin and meropenem dosing.  Plan: DW 62kg  Vd 43L kei 0.064 hr-1  T1/2 11 hours Vancomycin 750 mg q 12 hours ordered with stacked dosing. Level before 5th dose. Goal trough 15-20.  Meropenem 1 gram q 8 hours ordered.  Height: '5\' 3"'$  (160 cm) Weight: 166 lb (75.297 kg) IBW/kg (Calculated) : 52.4  Temp (24hrs), Avg:98.9 F (37.2 C), Min:98.7 F (37.1 C), Max:99 F (37.2 C)   Recent Labs Lab 08/09/15 1759  WBC 15.8*  CREATININE 0.74    Estimated Creatinine Clearance: 71.8 mL/min (by C-G formula based on Cr of 0.74).    Allergies  Allergen Reactions  . Ampicillin Hives and Other (See Comments)    Has patient had a PCN reaction causing immediate rash, facial/tongue/throat swelling, SOB or lightheadedness with hypotension: No Has patient had a PCN reaction causing severe rash involving mucus membranes or skin necrosis: No Has patient had a PCN reaction that required hospitalization No Has patient had a PCN reaction occurring within the last 10 years: Yes If all of the above answers are "NO", then may proceed with Cephalosporin use.  Marland Kitchen Penicillins Hives and Other (See Comments)    Has patient had a PCN reaction causing immediate rash, facial/tongue/throat swelling, SOB or lightheadedness with hypotension: No Has patient had a PCN reaction causing severe rash involving mucus membranes or skin necrosis: No Has patient had a PCN reaction that required hospitalization No Has patient had a PCN reaction occurring within the last 10 years: Yes If all of the above answers are "NO", then may proceed with Cephalosporin use.    Antimicrobials this admission: vancomycin  >>  meropenem  >>   Dose adjustments this admission:   Microbiology results: 5/17 BCx: pending 5/18 UCx: pending   5/17 CXR: LLL consolidation 5/18 UA: (-)  Thank you for  allowing pharmacy to be a part of this patient's care.  Odai Wimmer S 08/10/2015 2:16 AM

## 2015-08-10 NOTE — Care Management Important Message (Signed)
Important Message  Patient Details  Name: Charlene Williams MRN: [id number] Date of Birth: 1956/01/08   Medicare Important Message Given:  Yes    Taleah Bellantoni A, RN 08/10/2015, 7:00 AM

## 2015-08-10 NOTE — Progress Notes (Addendum)
[place] [facility] [place] NAME: Kammi Hechler    MR#:  [id number]  DATE OF BIRTH:  1955/05/20  SUBJECTIVE: Admitted for fever, sepsis secondary to pneumonia. Started on IV antibiotics. Patient feels better today. Does have some shortness of breath. No chest pain. No cough.   CHIEF COMPLAINT:   Chief Complaint  Patient presents with  . Ankle Pain  . Leg Swelling    REVIEW OF SYSTEMS:   Review of Systems  Constitutional: Positive for fever. Negative for chills.  HENT: Negative for hearing loss.   Eyes: Negative for blurred vision, double vision and photophobia.  Respiratory: Positive for shortness of breath. Negative for cough and hemoptysis.   Cardiovascular: Negative for palpitations, orthopnea and leg swelling.  Gastrointestinal: Negative for vomiting, abdominal pain and diarrhea.  Genitourinary: Negative for dysuria and urgency.  Musculoskeletal: Negative for myalgias and neck pain.  Skin: Negative for rash.  Neurological: Negative for dizziness, focal weakness, seizures, weakness and headaches.  Psychiatric/Behavioral: Negative for memory loss. The patient does not have insomnia.     DRUG ALLERGIES:   Allergies  Allergen Reactions  . Ampicillin Hives and Other (See Comments)    Has patient had a PCN reaction causing immediate rash, facial/tongue/throat swelling, SOB or lightheadedness with hypotension: No Has patient had a PCN reaction causing severe rash involving mucus membranes or skin necrosis: No Has patient had a PCN reaction that required hospitalization No Has patient had a PCN reaction occurring within the last 10 years: Yes If all of the above answers are "NO", then may proceed with Cephalosporin use.  Marland Kitchen Penicillins Hives and Other (See Comments)    Has patient had a PCN reaction causing immediate rash, facial/tongue/throat swelling, SOB or lightheadedness with hypotension: No Has patient had a PCN reaction  causing severe rash involving mucus membranes or skin necrosis: No Has patient had a PCN reaction that required hospitalization No Has patient had a PCN reaction occurring within the last 10 years: Yes If all of the above answers are "NO", then may proceed with Cephalosporin use.    VITALS:  Blood pressure 193/100, pulse 86, temperature 98.5 F (36.9 C), temperature source Oral, resp. rate 22, height '5\' 3"'$  (1.6 m), weight 75.297 kg (166 lb), SpO2 100 %.  PHYSICAL EXAMINATION:  GENERAL:  60 y.o.-year-old patient lying in the bed with no acute distress.  EYES: Pupils equal, round, reactive to light and accommodation. No scleral icterus. Extraocular muscles intact.  HEENT: Head atraumatic, normocephalic. Oropharynx and nasopharynx clear.  NECK:  Supple, no jugular venous distention. No thyroid enlargement, no tenderness.  LUNGS: decreased breath sounds at bases., no wheezing, rales,rhonchi or crepitation. No use of accessory muscles of respiration.  CARDIOVASCULAR: S1, S2 normal. No murmurs, rubs, or gallops.  ABDOMEN: Soft, nontender, nondistended. Bowel sounds present. No organomegaly or mass.  EXTREMITIES: No pedal edema, cyanosis, or clubbing.  NEUROLOGIC: Cranial nerves II through XII are intact. Muscle strength 5/5 in all extremities. Sensation intact. Gait not checked.  PSYCHIATRIC: The patient is alert and oriented x 3.  SKIN: No obvious rash, lesion, or ulcer.    LABORATORY PANEL:   CBC  Recent Labs Lab 08/10/15 0336  WBC 16.6*  HGB 9.3*  HCT 28.1*  PLT 109*   ------------------------------------------------------------------------------------------------------------------  Chemistries   Recent Labs Lab 08/10/15 0336  NA 136  K 3.7  CL 107  CO2 23  GLUCOSE 108*  BUN 11  CREATININE 0.65  CALCIUM 8.2*   ------------------------------------------------------------------------------------------------------------------  Cardiac Enzymes No results for input(s):  TROPONINI in the last 168 hours. ------------------------------------------------------------------------------------------------------------------  RADIOLOGY:  Dg Chest 2 View  08/09/2015  CLINICAL DATA:  Fever.  Lung carcinoma EXAM: CHEST  2 VIEW COMPARISON:  Chest radiograph July 18, 2015; chest CT Jul 29, 2015 FINDINGS: Port-A-Cath tip is in the superior vena cava. No pneumothorax. There is left lower lobe consolidation with loculated effusion on the left. Right lung is clear. Heart is upper normal in size. The pulmonary vascularity is normal. Soft tissue fullness in the left hilum is suspicious for adenopathy, better seen on recent CT. IMPRESSION: Persistent loculated effusion on the left with left lower lobe consolidation. Mild left hilar fullness consistent with adenopathy, better seen on CT. Right lung clear. Stable cardiac silhouette. No pneumothorax. Electronically Signed   By: Lowella Grip III M.D.   On: 08/09/2015 19:10   Dg Ankle Complete Right  08/09/2015  CLINICAL DATA:  Pain and swelling. Injury 3 weeks prior. Lung carcinoma EXAM: RIGHT ANKLE - COMPLETE 3+ VIEW COMPARISON:  None. FINDINGS: Frontal, oblique, and lateral views were obtained. There is generalized soft tissue swelling. There is a tiny avulsion along the dorsal distal talus. No other evidence suggesting fracture. No joint effusion. The ankle mortise appears intact. No blastic or lytic bone lesions. No appreciable arthropathy. IMPRESSION: Tiny avulsion along the dorsal distal talus. Soft tissue swelling. Ankle mortise appears intact. Electronically Signed   By: Lowella Grip III M.D.   On: 08/09/2015 19:08   US Venous Img Lower Bilateral  08/09/2015  CLINICAL DATA:  60 year old female with bilateral lower extremity swelling EXAM: BILATERAL LOWER EXTREMITY VENOUS DOPPLER ULTRASOUND TECHNIQUE: Gray-scale sonography with graded compression, as well as color Doppler and duplex ultrasound were performed to evaluate the lower  extremity deep venous systems from the level of the common femoral vein and including the common femoral, femoral, profunda femoral, popliteal and calf veins including the posterior tibial, peroneal and gastrocnemius veins when visible. The superficial great saphenous vein was also interrogated. Spectral Doppler was utilized to evaluate flow at rest and with distal augmentation maneuvers in the common femoral, femoral and popliteal veins. COMPARISON:  Left lower extremity venous Ultrasound dated 07/29/2015 FINDINGS: RIGHT LOWER EXTREMITY Common Femoral Vein: No evidence of thrombus. Normal compressibility, respiratory phasicity and response to augmentation. Saphenofemoral Junction: No evidence of thrombus. Normal compressibility and flow on color Doppler imaging. Profunda Femoral Vein: No evidence of thrombus. Normal compressibility and flow on color Doppler imaging. Femoral Vein: No evidence of thrombus. Normal compressibility, respiratory phasicity and response to augmentation. Popliteal Vein: No evidence of thrombus. Normal compressibility, respiratory phasicity and response to augmentation. Calf Veins: No evidence of thrombus. Normal compressibility and flow on color Doppler imaging. Superficial Great Saphenous Vein: No evidence of thrombus. Normal compressibility and flow on color Doppler imaging. Venous Reflux:  None. Other Findings:  None. LEFT LOWER EXTREMITY Common Femoral Vein: No evidence of thrombus. Normal compressibility, respiratory phasicity and response to augmentation. Saphenofemoral Junction: No evidence of thrombus. Normal compressibility and flow on color Doppler imaging. Profunda Femoral Vein: No evidence of thrombus. Normal compressibility and flow on color Doppler imaging. Femoral Vein: No evidence of thrombus. Normal compressibility, respiratory phasicity and response to augmentation. Popliteal Vein: No evidence of thrombus. Normal compressibility, respiratory phasicity and response to  augmentation. Calf Veins: No evidence of thrombus. Normal compressibility and flow on color Doppler imaging. Superficial Great Saphenous Vein: No evidence of thrombus. Normal compressibility and flow on color Doppler imaging. Venous Reflux:  None.  Other Findings: There is a 2.7 x 1.0 x 2.2 cm left popliteal fossa cyst. IMPRESSION: No evidence of deep venous thrombosis in the bilateral lower extremities. Electronically Signed   By: Anner Crete M.D.   On: 08/09/2015 19:37   Dg Foot Complete Right  08/09/2015  CLINICAL DATA:  60 year old female with fever. Patient presents with right ankle pain and swelling. EXAM: RIGHT FOOT COMPLETE - 3+ VIEW COMPARISON:  Right ankle radiographs dated 08/09/2015 FINDINGS: There is no acute fracture or dislocation. The bones are osteopenic. The ankle mortise is intact. There is no bony erosion or periosteal elevation. There is diffuse soft tissue swelling of the foot primarily over the mid and forefoot. No radiopaque foreign object identified. There is no soft tissue gas. IMPRESSION: Diffuse soft tissue swelling of the foot. No acute osseous pathology. Electronically Signed   By: Anner Crete M.D.   On: 08/09/2015 19:12    EKG:   Orders placed or performed during the hospital encounter of 08/09/15  . ED EKG  . ED EKG  . EKG 12-Lead  . EKG 12-Lead    ASSESSMENT AND PLAN:   1.sepsis present on admission secondary to healthcare associated pneumonia: continue Meropenem, oxygen. Patient still has a leukocytosis. Monitor closely. #2.Uncontrolled HTN; ;continue Enalapril,metoprolol ,added IV prn Hydrallazine. 3.h/oleft lung cancer' ;getting chemo 4.h/o PE;continue eliquis.\ 5.DMII; 6 history of depression: All the records are reviewed and case discussed with Care Management/Social Workerr. Management plans discussed with the patient, family and they are in agreement.  CODE STATUS: full  TOTAL TIME TAKING CARE OF THIS PATIENT: 15mnutes.   POSSIBLE D/C  IN 1-2DAYS, DEPENDING ON CLINICAL CONDITION.   KEpifanio LeschesM.D on 08/10/2015 at 12:57 PM  Between 7am to 6pm - Pager - [phone]  After 6pm go to [email or web address] - password [email or web address]  ETyna JakschHospitalists  Office  [phone] CC: Primary care physician; HKasandra Knudsen NP   Note: This dictation was prepared with Dragon dictation along with smaller phrase technology. Any transcriptional errors that result from this process are unintentional.

## 2015-08-11 LAB — GLUCOSE, CAPILLARY
GLUCOSE-CAPILLARY: 106 mg/dL — AB (ref 65–99)
GLUCOSE-CAPILLARY: 121 mg/dL — AB (ref 65–99)
GLUCOSE-CAPILLARY: 96 mg/dL (ref 65–99)
Glucose-Capillary: 105 mg/dL — ABNORMAL HIGH (ref 65–99)
Glucose-Capillary: 115 mg/dL — ABNORMAL HIGH (ref 65–99)

## 2015-08-11 LAB — VANCOMYCIN, TROUGH: VANCOMYCIN TR: 10 ug/mL (ref 10–20)

## 2015-08-11 LAB — URINE CULTURE

## 2015-08-11 LAB — CREATININE, SERUM
CREATININE: 0.79 mg/dL (ref 0.44–1.00)
GFR calc Af Amer: >60 mL/min (ref >60)

## 2015-08-11 MED ORDER — VANCOMYCIN HCL IN DEXTROSE 750-5 MG/150ML-% IV SOLN
750.0000 mg | Freq: Three times a day (TID) | INTRAVENOUS | Status: DC
  Administered 2015-08-11 – 2015-08-12 (×2): 750 mg via INTRAVENOUS
  Filled 2015-08-11 (×4): qty 150

## 2015-08-11 NOTE — Care Management Note (Signed)
Case Management Note  Patient Details  Name: Charlene Williams MRN: [id number] Date of Birth: Aug 02, 1955  Subjective/Objective:     CM was hoping to speak with daughter but have been unable to contact her thus far. [name] was admitted 08/09/15 with a diagnosis of PNA. She resides at home with her daughter. PCP=Chelsea Ecuador at [facility] in [place]. Pharmacy= unknown. Reports that she has a cane and a back brace. Daughter provides transportation.  Per call to [facility] Charlene Williams provided 2L N/C by them. Currently open to [facility] and is receiving RN, PT, OT, and Nurse Aid services as verified with Jana Half. Case management will follow for discharge planning.               Action/Plan:   Expected Discharge Date:                  Expected Discharge Plan:     In-House Referral:     Discharge planning Services     Post Acute Care Choice:    Choice offered to:     DME Arranged:    DME Agency:     HH Arranged:    [place] Agency:     Status of Service:     Medicare Important Message Given:  Yes Date Medicare IM Given:    Medicare IM give by:    Date Additional Medicare IM Given:    Additional Medicare Important Message give by:     If discussed at [place] of Stay Meetings, dates discussed:    Additional Comments:  Randal Goens A, RN 08/11/2015, 4:41 PM

## 2015-08-11 NOTE — Progress Notes (Signed)
[place] at [place] NAME: Charlene Williams    MR#:  [id number]  DATE OF BIRTH:  [birth year]  SUBJECTIVE: Admitted for fever, sepsis secondary to pneumonia. Started on IV antibiotics. Denies shortness of breath.  CHIEF COMPLAINT:   Chief Complaint  Patient presents with  . Ankle Pain  . Leg Swelling    REVIEW OF SYSTEMS:   Review of Systems  Constitutional: Negative for fever and chills.  HENT: Negative for hearing loss.   Eyes: Negative for blurred vision, double vision and photophobia.  Respiratory: Positive for shortness of breath. Negative for cough and hemoptysis.   Cardiovascular: Negative for palpitations, orthopnea and leg swelling.  Gastrointestinal: Negative for vomiting, abdominal pain and diarrhea.  Genitourinary: Negative for dysuria and urgency.  Musculoskeletal: Negative for myalgias and neck pain.  Skin: Negative for rash.  Neurological: Negative for dizziness, focal weakness, seizures, weakness and headaches.  Psychiatric/Behavioral: Negative for memory loss. The patient does not have insomnia.     DRUG ALLERGIES:   Allergies  Allergen Reactions  . Ampicillin Hives and Other (See Comments)    Has patient had a PCN reaction causing immediate rash, facial/tongue/throat swelling, SOB or lightheadedness with hypotension: No Has patient had a PCN reaction causing severe rash involving mucus membranes or skin necrosis: No Has patient had a PCN reaction that required hospitalization No Has patient had a PCN reaction occurring within the last 10 years: Yes If all of the above answers are "NO", then may proceed with Cephalosporin use.  Marland Kitchen Penicillins Hives and Other (See Comments)    Has patient had a PCN reaction causing immediate rash, facial/tongue/throat swelling, SOB or lightheadedness with hypotension: No Has patient had a PCN reaction causing severe rash involving mucus membranes or skin necrosis: No Has  patient had a PCN reaction that required hospitalization No Has patient had a PCN reaction occurring within the last 10 years: Yes If all of the above answers are "NO", then may proceed with Cephalosporin use.    VITALS:  Blood pressure 136/71, pulse 84, temperature 98.2 F (36.8 C), temperature source Oral, resp. rate 16, height '5\' 3"'$  (1.6 m), weight 75.297 kg (166 lb), SpO2 99 %.  PHYSICAL EXAMINATION:  GENERAL:  60 y.o.-year-old patient lying in the bed with no acute distress.  EYES: Pupils equal, round, reactive to light and accommodation. No scleral icterus. Extraocular muscles intact.  HEENT: Head atraumatic, normocephalic. Oropharynx and nasopharynx clear.  NECK:  Supple, no jugular venous distention. No thyroid enlargement, no tenderness.  LUNGS: decreased breath sounds at bases., no wheezing, rales,rhonchi or crepitation. No use of accessory muscles of respiration.  CARDIOVASCULAR: S1, S2 normal. No murmurs, rubs, or gallops.  ABDOMEN: Soft, nontender, nondistended. Bowel sounds present. No organomegaly or mass.  EXTREMITIES: No pedal edema, cyanosis, or clubbing.  NEUROLOGIC: Cranial nerves II through XII are intact. Muscle strength 5/5 in all extremities. Sensation intact. Gait not checked.  PSYCHIATRIC: The patient is alert and oriented x 3.  SKIN: No obvious rash, lesion, or ulcer.    LABORATORY PANEL:   CBC  Recent Labs Lab 08/10/15 0336  WBC 16.6*  HGB 9.3*  HCT 28.1*  PLT 109*   ------------------------------------------------------------------------------------------------------------------  Chemistries   Recent Labs Lab 08/10/15 0336  NA 136  K 3.7  CL 107  CO2 23  GLUCOSE 108*  BUN 11  CREATININE 0.65  CALCIUM 8.2*   ------------------------------------------------------------------------------------------------------------------  Cardiac Enzymes No results for input(s): TROPONINI in the last 168  hours. ------------------------------------------------------------------------------------------------------------------  RADIOLOGY:  Dg Chest 2 View  08/09/2015  CLINICAL DATA:  Fever.  Lung carcinoma EXAM: CHEST  2 VIEW COMPARISON:  Chest radiograph July 18, 2015; chest CT Jul 29, 2015 FINDINGS: Port-A-Cath tip is in the superior vena cava. No pneumothorax. There is left lower lobe consolidation with loculated effusion on the left. Right lung is clear. Heart is upper normal in size. The pulmonary vascularity is normal. Soft tissue fullness in the left hilum is suspicious for adenopathy, better seen on recent CT. IMPRESSION: Persistent loculated effusion on the left with left lower lobe consolidation. Mild left hilar fullness consistent with adenopathy, better seen on CT. Right lung clear. Stable cardiac silhouette. No pneumothorax. Electronically Signed   By: Lowella Grip III M.D.   On: 08/09/2015 19:10   Dg Ankle Complete Right  08/09/2015  CLINICAL DATA:  Pain and swelling. Injury 3 weeks prior. Lung carcinoma EXAM: RIGHT ANKLE - COMPLETE 3+ VIEW COMPARISON:  None. FINDINGS: Frontal, oblique, and lateral views were obtained. There is generalized soft tissue swelling. There is a tiny avulsion along the dorsal distal talus. No other evidence suggesting fracture. No joint effusion. The ankle mortise appears intact. No blastic or lytic bone lesions. No appreciable arthropathy. IMPRESSION: Tiny avulsion along the dorsal distal talus. Soft tissue swelling. Ankle mortise appears intact. Electronically Signed   By: Lowella Grip III M.D.   On: 08/09/2015 19:08   US Venous Img Lower Bilateral  08/09/2015  CLINICAL DATA:  60 year old female with bilateral lower extremity swelling EXAM: BILATERAL LOWER EXTREMITY VENOUS DOPPLER ULTRASOUND TECHNIQUE: Gray-scale sonography with graded compression, as well as color Doppler and duplex ultrasound were performed to evaluate the lower extremity deep venous  systems from the level of the common femoral vein and including the common femoral, femoral, profunda femoral, popliteal and calf veins including the posterior tibial, peroneal and gastrocnemius veins when visible. The superficial great saphenous vein was also interrogated. Spectral Doppler was utilized to evaluate flow at rest and with distal augmentation maneuvers in the common femoral, femoral and popliteal veins. COMPARISON:  Left lower extremity venous Ultrasound dated 07/29/2015 FINDINGS: RIGHT LOWER EXTREMITY Common Femoral Vein: No evidence of thrombus. Normal compressibility, respiratory phasicity and response to augmentation. Saphenofemoral Junction: No evidence of thrombus. Normal compressibility and flow on color Doppler imaging. Profunda Femoral Vein: No evidence of thrombus. Normal compressibility and flow on color Doppler imaging. Femoral Vein: No evidence of thrombus. Normal compressibility, respiratory phasicity and response to augmentation. Popliteal Vein: No evidence of thrombus. Normal compressibility, respiratory phasicity and response to augmentation. Calf Veins: No evidence of thrombus. Normal compressibility and flow on color Doppler imaging. Superficial Great Saphenous Vein: No evidence of thrombus. Normal compressibility and flow on color Doppler imaging. Venous Reflux:  None. Other Findings:  None. LEFT LOWER EXTREMITY Common Femoral Vein: No evidence of thrombus. Normal compressibility, respiratory phasicity and response to augmentation. Saphenofemoral Junction: No evidence of thrombus. Normal compressibility and flow on color Doppler imaging. Profunda Femoral Vein: No evidence of thrombus. Normal compressibility and flow on color Doppler imaging. Femoral Vein: No evidence of thrombus. Normal compressibility, respiratory phasicity and response to augmentation. Popliteal Vein: No evidence of thrombus. Normal compressibility, respiratory phasicity and response to augmentation. Calf Veins:  No evidence of thrombus. Normal compressibility and flow on color Doppler imaging. Superficial Great Saphenous Vein: No evidence of thrombus. Normal compressibility and flow on color Doppler imaging. Venous Reflux:  None. Other Findings: There is a 2.7 x 1.0 x 2.2 cm left  popliteal fossa cyst. IMPRESSION: No evidence of deep venous thrombosis in the bilateral lower extremities. Electronically Signed   By: Anner Crete M.D.   On: 08/09/2015 19:37   Dg Foot Complete Right  08/09/2015  CLINICAL DATA:  60 year old female with fever. Patient presents with right ankle pain and swelling. EXAM: RIGHT FOOT COMPLETE - 3+ VIEW COMPARISON:  Right ankle radiographs dated 08/09/2015 FINDINGS: There is no acute fracture or dislocation. The bones are osteopenic. The ankle mortise is intact. There is no bony erosion or periosteal elevation. There is diffuse soft tissue swelling of the foot primarily over the mid and forefoot. No radiopaque foreign object identified. There is no soft tissue gas. IMPRESSION: Diffuse soft tissue swelling of the foot. No acute osseous pathology. Electronically Signed   By: Anner Crete M.D.   On: 08/09/2015 19:12    EKG:   Orders placed or performed during the hospital encounter of 08/09/15  . ED EKG  . ED EKG  . EKG 12-Lead  . EKG 12-Lead    ASSESSMENT AND PLAN:   1.sepsis present on admission secondary to healthcare associated pneumonia: continue Meropenem,Vancomycin oxygen. Check WBC today  blood cultures are negative so far #2.Uncontrolled HTN; ; controlled now. 3.h/oleft lung cancer' ;getting chemo 4.h/o PE;continue eliquis.\ 5.DMII; controlled 6 history of depression: #7 . Chronic pain,; continue Percocet. likley discharge in 1-2 days. D/c tele All the records are reviewed and case discussed with Care Management/Social Workerr. Management plans discussed with the patient, family and they are in agreement.  CODE STATUS: full  TOTAL TIME TAKING CARE OF THIS  PATIENT: 19mnutes.   POSSIBLE D/C IN 1-2DAYS, DEPENDING ON CLINICAL CONDITION.   KEpifanio LeschesM.D on 08/11/2015 at 8:48 AM  Between 7am to 6pm - Pager - [phone]  After 6pm go to [email or web address] - password [email or web address]  [facility] JakschHospitalists  Office  [phone] CC: Primary care physician; HKasandra Knudsen NP   Note: This dictation was prepared with Dragon dictation along with smaller phrase technology. Any transcriptional errors that result from this process are unintentional.

## 2015-08-11 NOTE — Progress Notes (Signed)
Pharmacy Antibiotic Note  Charlene Williams is a 60 y.o. female admitted on 08/09/2015 with pneumonia/HCAP.  Pharmacy has been consulted for vancomycin and meropenem dosing.  Plan: DW 62kg  Vd 43L kei 0.064 hr-1  T1/2 11 hours Vancomycin 750 mg q 12 hours ordered with stacked dosing. Level before 5th dose. Goal trough 15-20.  Meropenem 1 gram q 8 hours ordered.  Patient on chemo for lung cancer.  Height: '5\' 3"'$  (160 cm) Weight: 166 lb (75.297 kg) IBW/kg (Calculated) : 52.4  Temp (24hrs), Avg:98.6 F (37 C), Min:98.2 F (36.8 C), Max:99.3 F (37.4 C)   Recent Labs Lab 08/09/15 1759 08/10/15 0336  WBC 15.8* 16.6*  CREATININE 0.74 0.65    Estimated Creatinine Clearance: 71.8 mL/min (by C-G formula based on Cr of 0.65).    Allergies  Allergen Reactions  . Ampicillin Hives and Other (See Comments)    Has patient had a PCN reaction causing immediate rash, facial/tongue/throat swelling, SOB or lightheadedness with hypotension: No Has patient had a PCN reaction causing severe rash involving mucus membranes or skin necrosis: No Has patient had a PCN reaction that required hospitalization No Has patient had a PCN reaction occurring within the last 10 years: Yes If all of the above answers are "NO", then may proceed with Cephalosporin use.  Marland Kitchen Penicillins Hives and Other (See Comments)    Has patient had a PCN reaction causing immediate rash, facial/tongue/throat swelling, SOB or lightheadedness with hypotension: No Has patient had a PCN reaction causing severe rash involving mucus membranes or skin necrosis: No Has patient had a PCN reaction that required hospitalization No Has patient had a PCN reaction occurring within the last 10 years: Yes If all of the above answers are "NO", then may proceed with Cephalosporin use.    Antimicrobials this admission: vancomycin 5/18 >>  meropenem  5/18>>   Dose adjustments this admission:   Microbiology results: 5/17 BCx: pending 5/18  UCx: multiple species  5/17 CXR: LLL consolidation 5/18 UA: (-)  Thank you for allowing pharmacy to be a part of this patient's care.  Jon Lall A 08/11/2015 2:34 PM

## 2015-08-11 NOTE — Progress Notes (Signed)
Pharmacy Antibiotic Note  Charlene Williams is a 60 y.o. female admitted on 08/09/2015 with pneumonia/HCAP.  Pharmacy has been consulted for vancomycin and meropenem dosing.  Plan: DW 62kg  Vd 43L kei 0.064 hr-1  T1/2 11 hours Vancomycin 750 mg q 12 hours ordered with stacked dosing. Level before 5th dose. Goal trough 15-20.  5/19:  Vanc trough @ 20:30 = 10 mcg/mL.   Will increase dose to Vancomycin 750 mg IV Q8H to start 5/19 @ 22:00 and recheck VT on 5/20 @ 21:30.   Meropenem 1 gram q 8 hours ordered.  Patient on chemo for lung cancer.  Height: '5\' 3"'$  (160 cm) Weight: 166 lb (75.297 kg) IBW/kg (Calculated) : 52.4  Temp (24hrs), Avg:98.5 F (36.9 C), Min:98.2 F (36.8 C), Max:99.3 F (37.4 C)   Recent Labs Lab 08/09/15 1759 08/10/15 0336 08/11/15 2022  WBC 15.8* 16.6*  --   CREATININE 0.74 0.65 0.79  VANCOTROUGH  --   --  10    Estimated Creatinine Clearance: 71.8 mL/min (by C-G formula based on Cr of 0.79).    Allergies  Allergen Reactions  . Ampicillin Hives and Other (See Comments)    Has patient had a PCN reaction causing immediate rash, facial/tongue/throat swelling, SOB or lightheadedness with hypotension: No Has patient had a PCN reaction causing severe rash involving mucus membranes or skin necrosis: No Has patient had a PCN reaction that required hospitalization No Has patient had a PCN reaction occurring within the last 10 years: Yes If all of the above answers are "NO", then may proceed with Cephalosporin use.  Marland Kitchen Penicillins Hives and Other (See Comments)    Has patient had a PCN reaction causing immediate rash, facial/tongue/throat swelling, SOB or lightheadedness with hypotension: No Has patient had a PCN reaction causing severe rash involving mucus membranes or skin necrosis: No Has patient had a PCN reaction that required hospitalization No Has patient had a PCN reaction occurring within the last 10 years: Yes If all of the above answers are "NO", then  may proceed with Cephalosporin use.    Antimicrobials this admission: vancomycin 5/18 >>  meropenem  5/18>>   Dose adjustments this admission: 5/19:  Vancomycin 750 mg IV Q12H >>> Vancomycin 750 mg IV Q8H to start 5/19 @ 22:00.    Microbiology results: 5/17 BCx: pending 5/18 UCx: multiple species  5/17 CXR: LLL consolidation 5/18 UA: (-)  Thank you for allowing pharmacy to be a part of this patient's care.  Apostolos Blagg D 08/11/2015 9:26 PM

## 2015-08-12 ENCOUNTER — Inpatient Hospital Stay: Payer: Medicare Other

## 2015-08-12 LAB — GLUCOSE, CAPILLARY
GLUCOSE-CAPILLARY: 96 mg/dL (ref 65–99)
GLUCOSE-CAPILLARY: 119 mg/dL — AB (ref 65–99)

## 2015-08-12 MED ORDER — OXYCODONE-ACETAMINOPHEN 5-325 MG PO TABS: 1.0000 | ORAL_TABLET | Freq: Four times a day (QID) | ORAL | Status: DC | PRN

## 2015-08-12 MED ORDER — ALBUTEROL SULFATE HFA 108 (90 BASE) MCG/ACT IN AERS: 2.0000 | INHALATION_SPRAY | Freq: Four times a day (QID) | RESPIRATORY_TRACT | Status: AC | PRN

## 2015-08-12 MED ORDER — LEVOFLOXACIN 500 MG PO TABS: 500.0000 mg | ORAL_TABLET | Freq: Every day | ORAL | Status: DC

## 2015-08-12 NOTE — Care Management Note (Signed)
Case Management Note  Patient Details  Name: Charlene Williams MRN: [id number] Date of Birth: [birth year]  Subjective/Objective:        Referral called to Jana Half at [facility] to resume home health services of RN, PT, OT, Nurse Aid.    [name] is currently an open client of [facility].          Action/Plan:   Expected Discharge Date:                  Expected Discharge Plan:     In-House Referral:     Discharge planning Services     Post Acute Care Choice:    Choice offered to:     DME Arranged:    DME Agency:     HH Arranged:    [place] Agency:     Status of Service:     Medicare Important Message Given:  Yes Date Medicare IM Given:    Medicare IM give by:    Date Additional Medicare IM Given:    Additional Medicare Important Message give by:     If discussed at [place] of Stay Meetings, dates discussed:    Additional Comments:  Ta Fair A, RN 08/12/2015, 12:48 PM

## 2015-08-12 NOTE — Progress Notes (Signed)
Patient discharging home. Right ankle wrapped in ace wrap for support. IV removed. Waiting on family to arrive for transportation. Continue to monitor.

## 2015-08-13 NOTE — Discharge Summary (Signed)
[place] [facility] [place] NAME: Charlene Williams    MR#:  [id number]  DATE OF BIRTH:  July 12, 1955  DATE OF ADMISSION:  08/09/2015 ADMITTING PHYSICIAN: Lance Coon, MD  DATE OF DISCHARGE: 08/12/2015  1:44 PM  PRIMARY CARE PHYSICIAN: HOLLAND, [place], NP    ADMISSION DIAGNOSIS:  Healthcare-associated pneumonia [J18.9] Talus fracture, right, closed, initial encounter [S92.101A]  DISCHARGE DIAGNOSIS:  Principal Problem:   Sepsis ([place]) Active Problems:   Essential hypertension, benign   Depression   Non-insulin dependent type 2 diabetes mellitus ([place])   COPD (chronic obstructive pulmonary disease) ([place])   Primary cancer of left lung metastatic to other site [facility])   Pulmonary embolism ([place])   HCAP (healthcare-associated pneumonia)   SECONDARY DIAGNOSIS:   Past Medical History  Diagnosis Date  . Diabetes mellitus without complication ([place])   . Sleep apnea   . Hypertension   . Aneurysm ([place])   . Hiatal hernia   . COPD (chronic obstructive pulmonary disease) ([place])     on home oxygen  . Illicit drug use   . Migraines   . Hypercholesteremia   . Depression   . Spine disorder   . Family history of chronic pain 01/17/2015  . Lung cancer ([place])     adenocarcinoma left lung    HOSPITAL COURSE:   59y/oF with PMH of left lung cancer recently diagnosed started on chemo, PE on eliquis, depression, chronic pain admitted  With sepsis  1. Sepsis- secondary to pneumonia- left lung - blood cultures negative - received vancomycin and meropenem in hospital- changed to levaquin at discharge - much improved breathing, has chronic left base atelectasis - not requiring oxygen   2. H/o Left lung cancer- recently diagnosed a month ago- following with oncology - started on chemo  3. PE- segmental PE diagnosed 2-3 weeks ago Continue eliquis  4. DM- metformin  5. HTN- enalapril, metoprolol  6. Chronic pain- cont pain meds   Discharge  home today   DISCHARGE CONDITIONS:   Stable  CONSULTS OBTAINED:   None  DRUG ALLERGIES:   Allergies  Allergen Reactions  . Ampicillin Hives and Other (See Comments)    Has patient had a PCN reaction causing immediate rash, facial/tongue/throat swelling, SOB or lightheadedness with hypotension: No Has patient had a PCN reaction causing severe rash involving mucus membranes or skin necrosis: No Has patient had a PCN reaction that required hospitalization No Has patient had a PCN reaction occurring within the last 10 years: Yes If all of the above answers are "NO", then may proceed with Cephalosporin use.  Marland Kitchen Penicillins Hives and Other (See Comments)    Has patient had a PCN reaction causing immediate rash, facial/tongue/throat swelling, SOB or lightheadedness with hypotension: No Has patient had a PCN reaction causing severe rash involving mucus membranes or skin necrosis: No Has patient had a PCN reaction that required hospitalization No Has patient had a PCN reaction occurring within the last 10 years: Yes If all of the above answers are "NO", then may proceed with Cephalosporin use.    DISCHARGE MEDICATIONS:   Discharge Medication List as of 08/12/2015 12:07 PM    START taking these medications   Details  albuterol (PROVENTIL HFA;VENTOLIN HFA) 108 (90 Base) MCG/ACT inhaler Inhale 2 puffs into the lungs every 6 (six) hours as needed for wheezing or shortness of breath., Starting 08/12/2015, Until Discontinued, Normal    levofloxacin (LEVAQUIN) 500 MG tablet Take 1 tablet (500 mg total) by  mouth daily., Starting 08/12/2015, Until Discontinued, Normal      CONTINUE these medications which have NOT CHANGED   Details  alendronate (FOSAMAX) 70 MG tablet Take 70 mg by mouth once a week. Pt takes on Tuesday.   Take with a full glass of water on an empty stomach., Until Discontinued, Historical Med    apixaban (ELIQUIS) 5 MG TABS tablet Take 1 tablet (5 mg total) by mouth 2 (two)  times daily., Starting 07/30/2015, Until Discontinued, Print    cetirizine (ZYRTEC) 10 MG tablet Take 10 mg by mouth at bedtime., Until Discontinued, Historical Med    DULoxetine (CYMBALTA) 30 MG capsule Take 90 mg by mouth daily. , Until Discontinued, Historical Med    enalapril (VASOTEC) 10 MG tablet Take 10 mg by mouth daily. , Until Discontinued, Historical Med    fluticasone (FLOVENT HFA) 220 MCG/ACT inhaler Inhale 1 puff into the lungs 2 (two) times daily., Until Discontinued, Historical Med    folic acid (FOLVITE) 1 MG tablet Take 1 mg by mouth daily., Until Discontinued, Historical Med    lidocaine-prilocaine (EMLA) cream Apply 1 application topically as needed (prior to accessing port)., Until Discontinued, Historical Med    magnesium oxide (MAG-OX) 400 (241.3 Mg) MG tablet Take 400 mg by mouth at bedtime., Until Discontinued, Historical Med    metFORMIN (GLUCOPHAGE-XR) 750 MG 24 hr tablet Take 750 mg by mouth daily with breakfast., Until Discontinued, Historical Med    metoprolol tartrate (LOPRESSOR) 25 MG tablet Take 1 tablet (25 mg total) by mouth 2 (two) times daily., Starting 06/27/2015, Until Discontinued, Normal    mirtazapine (REMERON) 45 MG tablet Take 45 mg by mouth at bedtime., Until Discontinued, Historical Med    omeprazole (PRILOSEC) 20 MG capsule Take 20 mg by mouth daily before breakfast. , Until Discontinued, Historical Med    ondansetron (ZOFRAN) 8 MG tablet Take 8 mg by mouth every 8 (eight) hours as needed for nausea or vomiting., Until Discontinued, Historical Med    prochlorperazine (COMPAZINE) 10 MG tablet Take 1 tablet (10 mg total) by mouth every 6 (six) hours as needed for nausea or vomiting., Starting 07/29/2015, Until Discontinued, Print    promethazine (PHENERGAN) 25 MG suppository Place 1 suppository (25 mg total) rectally every 6 (six) hours as needed for nausea or vomiting., Starting 07/30/2015, Until Discontinued, Print    simvastatin (ZOCOR) 20 MG  tablet Take 20 mg by mouth at bedtime. , Until Discontinued, Historical Med    traZODone (DESYREL) 150 MG tablet Take 150 mg by mouth at bedtime., Until Discontinued, Historical Med    oxyCODONE-acetaminophen (ROXICET) 5-325 MG tablet Take 1 tablet by mouth every 6 (six) hours as needed for severe pain., Starting 07/28/2015, Until Discontinued, Print      STOP taking these medications     dexamethasone (DECADRON) 4 MG tablet      loratadine (CLARITIN) 10 MG tablet          DISCHARGE INSTRUCTIONS:   1. PCP f/u in 1-2 weeks 2. Oncology f/u in 1week  If you experience worsening of your admission symptoms, develop shortness of breath, life threatening emergency, suicidal or homicidal thoughts you must seek medical attention immediately by calling 911 or calling your MD immediately  if symptoms less severe.  You Must read complete instructions/literature along with all the possible adverse reactions/side effects for all the Medicines you take and that have been prescribed to you. Take any new Medicines after you have completely understood and accept all the possible  adverse reactions/side effects.   Please note  You were cared for by a hospitalist during your hospital stay. If you have any questions about your discharge medications or the care you received while you were in the hospital after you are discharged, you can call the unit and asked to speak with the hospitalist on call if the hospitalist that took care of you is not available. Once you are discharged, your primary care physician will handle any further medical issues. Please note that NO REFILLS for any discharge medications will be authorized once you are discharged, as it is imperative that you return to your primary care physician (or establish a relationship with a primary care physician if you do not have one) for your aftercare needs so that they can reassess your need for medications and monitor your lab values.    Today    CHIEF COMPLAINT:   Chief Complaint  Patient presents with  . Ankle Pain  . Leg Swelling    VITAL SIGNS:  Blood pressure 120/63, pulse 84, temperature 98.5 F (36.9 C), temperature source Axillary, resp. rate 18, height '5\' 3"'$  (1.6 m), weight 75.297 kg (166 lb), SpO2 92 %.  I/O:  No intake or output data in the 24 hours ending 08/13/15 1808  PHYSICAL EXAMINATION:   Physical Exam  GENERAL:  60 y.o.-year-old patient lying in the bed with no acute distress.  EYES: Pupils equal, round, reactive to light and accommodation. No scleral icterus. Extraocular muscles intact.  HEENT: Head atraumatic, normocephalic. Oropharynx and nasopharynx clear.  NECK:  Supple, no jugular venous distention. No thyroid enlargement, no tenderness.  LUNGS: Normal breath sounds bilaterally, no wheezing, rales,rhonchi or crepitation. No use of accessory muscles of respiration. Decreased left basilar breath sounds with some rhonchi. CARDIOVASCULAR: S1, S2 normal. No murmurs, rubs, or gallops.  ABDOMEN: Soft, non-tender, non-distended. Bowel sounds present. No organomegaly or mass.  EXTREMITIES: No cyanosis, or clubbing. 1+ pedal edema. Right ankle tenderness on internal rotation. NEUROLOGIC: Cranial nerves II through XII are intact. Muscle strength 5/5 in all extremities. Sensation intact. Gait not checked.  PSYCHIATRIC: The patient is alert and oriented x 3.  SKIN: No obvious rash, lesion, or ulcer.   DATA REVIEW:   CBC  Recent Labs Lab 08/10/15 0336  WBC 16.6*  HGB 9.3*  HCT 28.1*  PLT 109*    Chemistries   Recent Labs Lab 08/10/15 0336 08/11/15 2022  NA 136  --   K 3.7  --   CL 107  --   CO2 23  --   GLUCOSE 108*  --   BUN 11  --   CREATININE 0.65 0.79  CALCIUM 8.2*  --     Cardiac Enzymes No results for input(s): TROPONINI in the last 168 hours.  Microbiology Results  Results for orders placed or performed during the hospital encounter of 08/09/15  Blood culture (routine x 2)      Status: None (Preliminary result)   Collection Time: 08/09/15  5:35 PM  Result Value Ref Range Status   Specimen Description BLOOD RIGHT ASSIST CONTROL  Final   Special Requests BOTTLES DRAWN AEROBIC AND ANAEROBIC  5CC  Final   Culture NO GROWTH 4 DAYS  Final   Report Status PENDING  Incomplete  Blood culture (routine x 2)     Status: None (Preliminary result)   Collection Time: 08/09/15  5:59 PM  Result Value Ref Range Status   Specimen Description BLOOD RIGHT ASSIST CONTROL  Final   Special  Requests BOTTLES DRAWN AEROBIC AND ANAEROBIC  5CC  Final   Culture NO GROWTH 4 DAYS  Final   Report Status PENDING  Incomplete  Urine culture     Status: Abnormal   Collection Time: 08/10/15 12:36 AM  Result Value Ref Range Status   Specimen Description URINE, RANDOM  Final   Special Requests NONE  Final   Culture MULTIPLE SPECIES PRESENT, SUGGEST RECOLLECTION (A)  Final   Report Status 08/11/2015 FINAL  Final    RADIOLOGY:  Dg Chest 2 View  08/12/2015  CLINICAL DATA:  Pneumonia.  Lung cancer. EXAM: CHEST  2 VIEW COMPARISON:  08/09/2015 chest radiograph. FINDINGS: Left subclavian MediPort terminates in the upper third of the superior vena cava. Stable cardiomediastinal silhouette with top-normal heart size. No pneumothorax. Stable moderate left pleural effusion. No right pleural effusion. Stable patchy consolidation at the left lung base. No new consolidative airspace disease. IMPRESSION: 1. Stable moderate left pleural effusion. 2. Stable patchy left lung base consolidation, either atelectasis, pneumonia and/or tumor. Electronically Signed   By: Ilona Sorrel M.D.   On: 08/12/2015 11:32    EKG:   Orders placed or performed during the hospital encounter of 08/09/15  . ED EKG  . ED EKG  . EKG 12-Lead  . EKG 12-Lead  . EKG      Management plans discussed with the patient, family and they are in agreement.  CODE STATUS:  Code Status History    Date Active Date Inactive Code Status  Order ID Comments User Context   08/10/2015  1:44 AM 08/12/2015  4:45 PM Full Code [id number]  Lance Coon, MD Inpatient   07/29/2015  5:56 PM 07/30/2015  4:42 PM Full Code [id number]  Gladstone Lighter, MD Inpatient   07/11/2015  5:25 PM 07/19/2015  6:21 PM Full Code [id number]  Henreitta Leber, MD Inpatient   06/25/2015  6:35 PM 06/27/2015  4:00 PM Full Code [id number]  Theodoro Grist, MD ED      TOTAL TIME TAKING CARE OF THIS PATIENT: 37 minutes.    Gladstone Lighter M.D on 08/13/2015 at 6:08 PM  Between 7am to 6pm - Pager - [phone]  After 6pm go to [email or web address] - password [email or web address]  [facility]  Office  [phone]  CC: Primary care physician; Kasandra Knudsen, NP

## 2015-08-14 ENCOUNTER — Encounter: Payer: Self-pay | Admitting: Pathology

## 2015-08-14 ENCOUNTER — Ambulatory Visit
Admission: RE | Admit: 2015-08-14 | Discharge: 2015-08-14 | Disposition: A | Discharge status: Home / Self Care | Payer: Medicare Other | Source: Ambulatory Visit | Attending: Internal Medicine | Admitting: Internal Medicine

## 2015-08-14 ENCOUNTER — Other Ambulatory Visit: Payer: Self-pay | Admitting: *Deleted

## 2015-08-14 DIAGNOSIS — C3432 Malignant neoplasm of lower lobe, left bronchus or lung: Secondary | ICD-10-CM

## 2015-08-14 DIAGNOSIS — C3492 Malignant neoplasm of unspecified part of left bronchus or lung: Secondary | ICD-10-CM | POA: Insufficient documentation

## 2015-08-14 DIAGNOSIS — I6782 Cerebral ischemia: Secondary | ICD-10-CM | POA: Diagnosis not present

## 2015-08-14 LAB — CULTURE, BLOOD (ROUTINE X 2)
Culture: NO GROWTH
Culture: NO GROWTH

## 2015-08-14 MED ORDER — GADOBENATE DIMEGLUMINE 529 MG/ML IV SOLN
15.0000 mL | Freq: Once | INTRAVENOUS | Status: AC | PRN
  Administered 2015-08-14: 15 mL via INTRAVENOUS

## 2015-08-14 MED ORDER — PROCHLORPERAZINE MALEATE 10 MG PO TABS: 10.0000 mg | ORAL_TABLET | Freq: Four times a day (QID) | ORAL | Status: DC | PRN

## 2015-08-17 ENCOUNTER — Inpatient Hospital Stay (HOSPITAL_BASED_OUTPATIENT_CLINIC_OR_DEPARTMENT_OTHER): Payer: Medicare Other | Admitting: Internal Medicine

## 2015-08-17 ENCOUNTER — Inpatient Hospital Stay: Payer: Medicare Other

## 2015-08-17 VITALS — BP 115/71 | HR 108 | Temp 98.0°F | Ht 63.0 in | Wt 155.2 lb

## 2015-08-17 DIAGNOSIS — Z79899 Other long term (current) drug therapy: Secondary | ICD-10-CM

## 2015-08-17 DIAGNOSIS — I10 Essential (primary) hypertension: Secondary | ICD-10-CM

## 2015-08-17 DIAGNOSIS — C3432 Malignant neoplasm of lower lobe, left bronchus or lung: Secondary | ICD-10-CM

## 2015-08-17 DIAGNOSIS — E7800 Pure hypercholesterolemia, unspecified: Secondary | ICD-10-CM | POA: Diagnosis not present

## 2015-08-17 DIAGNOSIS — M7989 Other specified soft tissue disorders: Secondary | ICD-10-CM

## 2015-08-17 DIAGNOSIS — Z7982 Long term (current) use of aspirin: Secondary | ICD-10-CM | POA: Diagnosis not present

## 2015-08-17 DIAGNOSIS — M79604 Pain in right leg: Secondary | ICD-10-CM | POA: Diagnosis not present

## 2015-08-17 DIAGNOSIS — Z5111 Encounter for antineoplastic chemotherapy: Secondary | ICD-10-CM | POA: Diagnosis present

## 2015-08-17 DIAGNOSIS — C3492 Malignant neoplasm of unspecified part of left bronchus or lung: Secondary | ICD-10-CM

## 2015-08-17 DIAGNOSIS — Z7689 Persons encountering health services in other specified circumstances: Secondary | ICD-10-CM | POA: Diagnosis not present

## 2015-08-17 DIAGNOSIS — J910 Malignant pleural effusion: Secondary | ICD-10-CM | POA: Diagnosis not present

## 2015-08-17 DIAGNOSIS — J449 Chronic obstructive pulmonary disease, unspecified: Secondary | ICD-10-CM | POA: Diagnosis not present

## 2015-08-17 DIAGNOSIS — F17210 Nicotine dependence, cigarettes, uncomplicated: Secondary | ICD-10-CM | POA: Diagnosis not present

## 2015-08-17 DIAGNOSIS — E119 Type 2 diabetes mellitus without complications: Secondary | ICD-10-CM | POA: Diagnosis not present

## 2015-08-17 LAB — CBC WITH DIFFERENTIAL/PLATELET
Basophils Absolute: 0.1 10*3/uL (ref 0–0.1)
Basophils Relative: 1 %
EOS ABS: 0.1 10*3/uL (ref 0–0.7)
EOS PCT: 1 %
HCT: 31.2 % — ABNORMAL LOW (ref 35.0–47.0)
Hemoglobin: 10.3 g/dL — ABNORMAL LOW (ref 12.0–16.0)
LYMPHS ABS: 2.6 10*3/uL (ref 1.0–3.6)
Lymphocytes Relative: 29 %
MCH: 27.7 pg (ref 26.0–34.0)
MCHC: 33.1 g/dL (ref 32.0–36.0)
MCV: 83.7 fL (ref 80.0–100.0)
MONOS PCT: 16 %
Monocytes Absolute: 1.4 10*3/uL — ABNORMAL HIGH (ref 0.2–0.9)
NEUTROS PCT: 53 %
Neutro Abs: 4.6 10*3/uL (ref 1.4–6.5)
PLATELETS: 443 10*3/uL — AB (ref 150–440)
RBC: 3.73 MIL/uL — ABNORMAL LOW (ref 3.80–5.20)
RDW: 17.4 % — ABNORMAL HIGH (ref 11.5–14.5)
WBC: 8.9 10*3/uL (ref 3.6–11.0)

## 2015-08-17 LAB — BASIC METABOLIC PANEL
Anion gap: 7 (ref 5–15)
BUN: 19 mg/dL (ref 6–20)
CO2: 28 mmol/L (ref 22–32)
CREATININE: 0.99 mg/dL (ref 0.44–1.00)
Calcium: 9.6 mg/dL (ref 8.9–10.3)
Chloride: 104 mmol/L (ref 101–111)
GFR calc Af Amer: >60 mL/min (ref >60)
Glucose, Bld: 107 mg/dL — ABNORMAL HIGH (ref 65–99)
Potassium: 4.2 mmol/L (ref 3.5–5.1)
SODIUM: 139 mmol/L (ref 135–145)

## 2015-08-17 LAB — TSH: TSH: 0.88 u[IU]/mL (ref 0.350–4.500)

## 2015-08-17 NOTE — Progress Notes (Signed)
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  In the interim patient was evaluated in the emergency room for shortness of breath-noted to have PE.  Patient states her shortness of breath is improved. No significant cough. Chest pain is stable.  Appetite is good. Denies any weight loss.   REVIEW OF SYSTEMS:  A complete 10 point review of system is done which is negative except mentioned above/history of present illness.   PAST MEDICAL HISTORY :  Past Medical History  Diagnosis Date  . Diabetes mellitus without complication ([place])   . Sleep apnea   . Hypertension   . Aneurysm ([place])   . Hiatal hernia   . COPD (chronic obstructive pulmonary disease) ([place])     on home oxygen  . Illicit drug use   . Migraines   . Hypercholesteremia   . Depression   . Spine disorder   . Family history of chronic pain 01/17/2015  . Lung cancer ([place])     adenocarcinoma left lung    PAST SURGICAL HISTORY :   Past Surgical History  Procedure Laterality Date  . Abdominal hysterectomy    . Hernia repair    . Eye surgery    . Video assisted thoracoscopy (vats)/thorocotomy Left 07/14/2015    Procedure: VIDEO ASSISTED THORACOSCOPY (VATS), pleural biopsy;  Surgeon: Nestor Lewandowsky, MD;  Location: [facility] ORS;  Service: General;  Laterality: Left;  . Portacath placement Left 07/14/2015    Procedure: INSERTION  PORT-A-CATH;  Surgeon: Nestor Lewandowsky, MD;  Location: [facility] ORS;  Service: General;  Laterality: Left;  . Removal of pleural drainage catheter N/A 07/18/2015    Procedure: REMOVAL OF PLEURAL DRAINAGE CATHETER;  Surgeon: Nestor Lewandowsky, MD;  Location: [facility] ORS;  Service: Thoracic;  Laterality: N/A;    FAMILY HISTORY :   Family History  Problem Relation Age of Onset  . Heart disease Mother   . Breast cancer Sister     SOCIAL HISTORY:   Social History  Substance Use Topics  . Smoking status: Former Smoker -- 1.50 packs/day for 45 years    Types: Cigarettes    Quit date: 04/29/2015  . Smokeless tobacco: Not on file     Comment: Quit 2 months ago  . Alcohol Use: No    ALLERGIES:  is allergic to ampicillin and penicillins.  MEDICATIONS:  Current Outpatient Prescriptions  Medication Sig Dispense Refill  . albuterol (PROVENTIL HFA;VENTOLIN HFA) 108 (90 Base) MCG/ACT inhaler Inhale 2 puffs into the lungs every 6 (six) hours as needed for wheezing or shortness of breath. 1 Inhaler 2  . alendronate (FOSAMAX) 70 MG tablet Take 70 mg by mouth once a week. Pt takes on Tuesday.   Take with a full glass of water on an empty stomach.    Marland Kitchen apixaban (ELIQUIS) 5 MG TABS tablet Take 1 tablet (5 mg total) by mouth 2 (two) times daily. 60 tablet 1  . cetirizine (ZYRTEC) 10 MG tablet Take 10 mg  by mouth at bedtime.    . DULoxetine (CYMBALTA) 30 MG capsule Take 90 mg by mouth daily.     . enalapril (VASOTEC) 10 MG tablet Take 10 mg by mouth daily.     . fluticasone (FLOVENT HFA) 220 MCG/ACT inhaler Inhale 1 puff into the lungs 2 (two) times daily.    . folic acid (FOLVITE) 1 MG tablet Take 1 mg by mouth daily.    Marland Kitchen levofloxacin (LEVAQUIN) 500 MG tablet Take 1 tablet (500 mg total) by mouth daily. 5 tablet 0  . lidocaine-prilocaine (EMLA) cream Apply 1 application topically as needed (prior to accessing port).    . magnesium oxide (MAG-OX) 400 (241.3 Mg) MG tablet Take 400 mg by mouth at bedtime.    .  metFORMIN (GLUCOPHAGE-XR) 750 MG 24 hr tablet Take 750 mg by mouth daily with breakfast.    . metoprolol tartrate (LOPRESSOR) 25 MG tablet Take 1 tablet (25 mg total) by mouth 2 (two) times daily. 60 tablet 0  . mirtazapine (REMERON) 45 MG tablet Take 45 mg by mouth at bedtime.    Marland Kitchen omeprazole (PRILOSEC) 20 MG capsule Take 20 mg by mouth daily before breakfast.     . ondansetron (ZOFRAN) 8 MG tablet Take 8 mg by mouth every 8 (eight) hours as needed for nausea or vomiting.    Marland Kitchen oxyCODONE-acetaminophen (ROXICET) 5-325 MG tablet Take 1 tablet by mouth every 6 (six) hours as needed for moderate pain or severe pain. 20 tablet 0  . prochlorperazine (COMPAZINE) 10 MG tablet Take 1 tablet (10 mg total) by mouth every 6 (six) hours as needed for nausea or vomiting. 30 tablet 0  . promethazine (PHENERGAN) 25 MG suppository Place 1 suppository (25 mg total) rectally every 6 (six) hours as needed for nausea or vomiting. 12 each 0  . simvastatin (ZOCOR) 20 MG tablet Take 20 mg by mouth at bedtime.     . traZODone (DESYREL) 150 MG tablet Take 150 mg by mouth at bedtime.     No current facility-administered medications for this visit.    PHYSICAL EXAMINATION: ECOG PERFORMANCE STATUS: 1 - Symptomatic but completely ambulatory  BP 115/71 mmHg  Pulse 108  Temp(Src) 98 F (36.7 C) (Tympanic)  Ht 5' 3"  (1.6 m)  Wt 155 lb 3.3 oz (70.4 kg)  BMI 27.50 kg/m2  Filed Weights   08/17/15 1523  Weight: 155 lb 3.3 oz (70.4 kg)    GENERAL: Well-nourished well-developed; Alert, no distress and comfortable. She is walking with a cane.  Accompanied by family. EYES: no pallor or icterus OROPHARYNX: no thrush or ulceration; good dentition  NECK: supple, no masses felt LYMPH:  no palpable lymphadenopathy in the cervical, axillary or inguinal regions LUNGS: Decreased breath sounds on the left side compared to the right.  No wheeze or crackles HEART/CVS: regular rate & rhythm and no murmurs; right lower extremity  swelling/tenderness in the calf. ABDOMEN:abdomen soft, non-tender and normal bowel sounds Musculoskeletal:no cyanosis of digits and no clubbing; palpable swelling in the right arm 5 x 5 cm [stable] PSYCH: alert & oriented x 3 with fluent speech NEURO: no focal motor/sensory deficits SKIN:  no rashes or significant lesions  LABORATORY DATA:  I have reviewed the data as listed    Component Value Date/Time   NA 139 08/17/2015 1416   NA 141 02/15/2013 1152   K 4.2 08/17/2015 1416   K 4.1 02/15/2013 1152   CL 104 08/17/2015 1416   CL 115* 02/15/2013 1152  CO2 28 08/17/2015 1416   CO2 21 02/15/2013 1152   GLUCOSE 107* 08/17/2015 1416   GLUCOSE 85 02/15/2013 1152   BUN 19 08/17/2015 1416   BUN 20* 02/15/2013 1152   CREATININE 0.99 08/17/2015 1416   CREATININE 0.96 02/15/2013 1152   CALCIUM 9.6 08/17/2015 1416   CALCIUM 9.0 02/15/2013 1152   PROT 6.6 07/29/2015 1356   PROT 7.8 07/18/2012 1025   ALBUMIN 2.9* 07/29/2015 1356   ALBUMIN 4.0 07/18/2012 1025   AST 27 07/29/2015 1356   AST 20 07/18/2012 1025   ALT 23 07/29/2015 1356   ALT 20 07/18/2012 1025   ALKPHOS 81 07/29/2015 1356   ALKPHOS 116 07/18/2012 1025   BILITOT 0.5 07/29/2015 1356   BILITOT 0.5 07/18/2012 1025   GFRNONAA >60 08/17/2015 1416   GFRNONAA >60 02/15/2013 1152   GFRAA >60 08/17/2015 1416   GFRAA >60 02/15/2013 1152    No results found for: SPEP, UPEP  Lab Results  Component Value Date   WBC 8.9 08/17/2015   NEUTROABS 4.6 08/17/2015   HGB 10.3* 08/17/2015   HCT 31.2* 08/17/2015   MCV 83.7 08/17/2015   PLT 443* 08/17/2015      Chemistry      Component Value Date/Time   NA 139 08/17/2015 1416   NA 141 02/15/2013 1152   K 4.2 08/17/2015 1416   K 4.1 02/15/2013 1152   CL 104 08/17/2015 1416   CL 115* 02/15/2013 1152   CO2 28 08/17/2015 1416   CO2 21 02/15/2013 1152   BUN 19 08/17/2015 1416   BUN 20* 02/15/2013 1152   CREATININE 0.99 08/17/2015 1416   CREATININE 0.96 02/15/2013 1152        Component Value Date/Time   CALCIUM 9.6 08/17/2015 1416   CALCIUM 9.0 02/15/2013 1152   ALKPHOS 81 07/29/2015 1356   ALKPHOS 116 07/18/2012 1025   AST 27 07/29/2015 1356   AST 20 07/18/2012 1025   ALT 23 07/29/2015 1356   ALT 20 07/18/2012 1025   BILITOT 0.5 07/29/2015 1356   BILITOT 0.5 07/18/2012 1025        ASSESSMENT & PLAN:   # LEFT LOWER LUNG ADENOCARCINOMA- STAGE IV- Proceed with palliative chemotherapy with carboplatin and Alimta. Avastin was planned; not given. Discontinue Avastin given the new pulmonary embolism.  # Proceed with cycle #2 of carboplatin Alimta. Given the recent FDA approval for Keytruda- we will add Keytruda with cycle #2. Reviewed the potential side effects of Keytruda in detail. We'll check a TSH.  # PE- on anti-coagulation with eliquis.  # LEFT LUNG MALIGNANT EFFUSION- s/p Pleurodesis. Symptomatically seems to improve her breathing.  # PAIN CONTROL- currently stable taking oxycodone as needed.  # RIGHT LE swelling- trauma- Dopplers- NEG for DVT.  # Chemo- carbo-alimta-keytruda on 30th/Tuesday; Follow up With MD in 10 days post chemo/ labs.      Cammie Sickle, MD 08/17/2015 5:00 PM

## 2015-08-17 NOTE — Progress Notes (Signed)
Patient here for follow up. No complaints today. 

## 2015-08-22 ENCOUNTER — Inpatient Hospital Stay: Payer: Medicare Other

## 2015-08-22 ENCOUNTER — Other Ambulatory Visit: Payer: Self-pay | Admitting: *Deleted

## 2015-08-22 VITALS — BP 138/76 | HR 90 | Temp 96.0°F | Resp 18

## 2015-08-22 DIAGNOSIS — C3492 Malignant neoplasm of unspecified part of left bronchus or lung: Secondary | ICD-10-CM

## 2015-08-22 DIAGNOSIS — Z5111 Encounter for antineoplastic chemotherapy: Secondary | ICD-10-CM | POA: Diagnosis not present

## 2015-08-22 DIAGNOSIS — C3432 Malignant neoplasm of lower lobe, left bronchus or lung: Secondary | ICD-10-CM

## 2015-08-22 LAB — SURGICAL PATHOLOGY

## 2015-08-22 MED ORDER — PALONOSETRON HCL INJECTION 0.25 MG/5ML
0.2500 mg | Freq: Once | INTRAVENOUS | Status: AC
Start: 2015-08-22 — End: 2015-08-22
  Administered 2015-08-22: 0.25 mg via INTRAVENOUS
  Filled 2015-08-22: qty 5

## 2015-08-22 MED ORDER — OXYCODONE-ACETAMINOPHEN 5-325 MG PO TABS: 1.0000 | ORAL_TABLET | Freq: Four times a day (QID) | ORAL | Status: DC | PRN

## 2015-08-22 MED ORDER — HEPARIN SOD (PORK) LOCK FLUSH 100 UNIT/ML IV SOLN
500.0000 [IU] | Freq: Once | INTRAVENOUS | Status: AC | PRN
  Administered 2015-08-22: 500 [IU]
  Filled 2015-08-22: qty 5

## 2015-08-22 MED ORDER — PEMBROLIZUMAB CHEMO INJECTION 100 MG/4ML
200.0000 mg | Freq: Once | INTRAVENOUS | Status: AC
  Administered 2015-08-22: 200 mg via INTRAVENOUS
  Filled 2015-08-22: qty 8

## 2015-08-22 MED ORDER — SODIUM CHLORIDE 0.9 % IV SOLN
450.0000 mg | Freq: Once | INTRAVENOUS | Status: AC
  Administered 2015-08-22: 450 mg via INTRAVENOUS
  Filled 2015-08-22: qty 45

## 2015-08-22 MED ORDER — SODIUM CHLORIDE 0.9 % IV SOLN
500.0000 mg/m2 | Freq: Once | INTRAVENOUS | Status: AC
  Administered 2015-08-22: 925 mg via INTRAVENOUS
  Filled 2015-08-22: qty 37

## 2015-08-22 MED ORDER — PEGFILGRASTIM 6 MG/0.6ML ~~LOC~~ PSKT
6.0000 mg | PREFILLED_SYRINGE | Freq: Once | SUBCUTANEOUS | Status: AC
  Administered 2015-08-22: 6 mg via SUBCUTANEOUS
  Filled 2015-08-22: qty 0.6

## 2015-08-22 MED ORDER — SODIUM CHLORIDE 0.9% FLUSH
10.0000 mL | INTRAVENOUS | Status: DC | PRN
  Administered 2015-08-22: 10 mL
  Filled 2015-08-22: qty 10

## 2015-08-22 MED ORDER — SODIUM CHLORIDE 0.9 % IV SOLN
Freq: Once | INTRAVENOUS | Status: AC
  Administered 2015-08-22: 09:00:00 via INTRAVENOUS
  Filled 2015-08-22: qty 1000

## 2015-08-22 MED ORDER — SODIUM CHLORIDE 0.9 % IV SOLN
10.0000 mg | Freq: Once | INTRAVENOUS | Status: AC
  Administered 2015-08-22: 10 mg via INTRAVENOUS
  Filled 2015-08-22: qty 1

## 2015-08-22 NOTE — Progress Notes (Signed)
Last Carboplatin dose was '550mg'$ . Calculated dose today is '450mg'$ , more than 10% difference, due to slight increase in SCr and decrease in wt. Spoke with Magda Paganini and will decrease dose to '450mg'$  at this time.

## 2015-08-23 ENCOUNTER — Encounter: Payer: Self-pay | Admitting: Pathology

## 2015-08-30 ENCOUNTER — Inpatient Hospital Stay (HOSPITAL_BASED_OUTPATIENT_CLINIC_OR_DEPARTMENT_OTHER): Payer: Medicare Other | Admitting: Internal Medicine

## 2015-08-30 ENCOUNTER — Inpatient Hospital Stay: Payer: Medicare Other | Attending: Internal Medicine

## 2015-08-30 VITALS — BP 161/94 | HR 81 | Temp 97.7°F | Ht 63.0 in | Wt 162.3 lb

## 2015-08-30 DIAGNOSIS — R079 Chest pain, unspecified: Secondary | ICD-10-CM

## 2015-08-30 DIAGNOSIS — K449 Diaphragmatic hernia without obstruction or gangrene: Secondary | ICD-10-CM

## 2015-08-30 DIAGNOSIS — Z7689 Persons encountering health services in other specified circumstances: Secondary | ICD-10-CM | POA: Insufficient documentation

## 2015-08-30 DIAGNOSIS — J449 Chronic obstructive pulmonary disease, unspecified: Secondary | ICD-10-CM | POA: Diagnosis not present

## 2015-08-30 DIAGNOSIS — G4730 Sleep apnea, unspecified: Secondary | ICD-10-CM

## 2015-08-30 DIAGNOSIS — Z5111 Encounter for antineoplastic chemotherapy: Secondary | ICD-10-CM | POA: Diagnosis present

## 2015-08-30 DIAGNOSIS — F329 Major depressive disorder, single episode, unspecified: Secondary | ICD-10-CM | POA: Diagnosis not present

## 2015-08-30 DIAGNOSIS — I10 Essential (primary) hypertension: Secondary | ICD-10-CM | POA: Insufficient documentation

## 2015-08-30 DIAGNOSIS — Z79899 Other long term (current) drug therapy: Secondary | ICD-10-CM | POA: Insufficient documentation

## 2015-08-30 DIAGNOSIS — E7800 Pure hypercholesterolemia, unspecified: Secondary | ICD-10-CM | POA: Diagnosis not present

## 2015-08-30 DIAGNOSIS — J910 Malignant pleural effusion: Secondary | ICD-10-CM | POA: Insufficient documentation

## 2015-08-30 DIAGNOSIS — Z7984 Long term (current) use of oral hypoglycemic drugs: Secondary | ICD-10-CM | POA: Insufficient documentation

## 2015-08-30 DIAGNOSIS — Z803 Family history of malignant neoplasm of breast: Secondary | ICD-10-CM

## 2015-08-30 DIAGNOSIS — Z87891 Personal history of nicotine dependence: Secondary | ICD-10-CM | POA: Insufficient documentation

## 2015-08-30 DIAGNOSIS — Z7901 Long term (current) use of anticoagulants: Secondary | ICD-10-CM

## 2015-08-30 DIAGNOSIS — E119 Type 2 diabetes mellitus without complications: Secondary | ICD-10-CM | POA: Diagnosis not present

## 2015-08-30 DIAGNOSIS — C3432 Malignant neoplasm of lower lobe, left bronchus or lung: Secondary | ICD-10-CM | POA: Insufficient documentation

## 2015-08-30 DIAGNOSIS — C3492 Malignant neoplasm of unspecified part of left bronchus or lung: Secondary | ICD-10-CM

## 2015-08-30 LAB — CBC WITH DIFFERENTIAL/PLATELET
BASOS ABS: 0 10*3/uL (ref 0–0.1)
Band Neutrophils: 6 %
Basophils Relative: 0 %
EOS ABS: 0.7 10*3/uL (ref 0–0.7)
Eosinophils Relative: 2 %
HCT: 30.6 % — ABNORMAL LOW (ref 35.0–47.0)
HEMOGLOBIN: 10 g/dL — AB (ref 12.0–16.0)
LYMPHS ABS: 6.8 10*3/uL — AB (ref 1.0–3.6)
LYMPHS PCT: 19 %
MCH: 27.2 pg (ref 26.0–34.0)
MCHC: 32.7 g/dL (ref 32.0–36.0)
MCV: 83.2 fL (ref 80.0–100.0)
METAMYELOCYTES PCT: 1 %
MONOS PCT: 6 %
Monocytes Absolute: 2.1 10*3/uL — ABNORMAL HIGH (ref 0.2–0.9)
Myelocytes: 2 %
NEUTROS ABS: 26 10*3/uL — AB (ref 1.4–6.5)
Neutrophils Relative %: 64 %
Platelets: 314 10*3/uL (ref 150–440)
RBC: 3.68 MIL/uL — AB (ref 3.80–5.20)
RDW: 17.5 % — ABNORMAL HIGH (ref 11.5–14.5)
Smear Review: ADEQUATE
WBC: 35.6 10*3/uL — AB (ref 3.6–11.0)

## 2015-08-30 LAB — BASIC METABOLIC PANEL
ANION GAP: 6 (ref 5–15)
BUN: 20 mg/dL (ref 6–20)
CO2: 28 mmol/L (ref 22–32)
Calcium: 9.3 mg/dL (ref 8.9–10.3)
Chloride: 102 mmol/L (ref 101–111)
Creatinine, Ser: 0.87 mg/dL (ref 0.44–1.00)
GFR calc Af Amer: >60 mL/min (ref >60)
Glucose, Bld: 100 mg/dL — ABNORMAL HIGH (ref 65–99)
POTASSIUM: 4.1 mmol/L (ref 3.5–5.1)
SODIUM: 136 mmol/L (ref 135–145)

## 2015-08-30 MED ORDER — MORPHINE SULFATE ER 15 MG PO TBCR: 30.0000 mg | EXTENDED_RELEASE_TABLET | Freq: Two times a day (BID) | ORAL | Status: DC

## 2015-08-30 MED ORDER — OXYCODONE-ACETAMINOPHEN 5-325 MG PO TABS: 1.0000 | ORAL_TABLET | Freq: Three times a day (TID) | ORAL | Status: DC | PRN

## 2015-08-30 NOTE — Progress Notes (Signed)
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  Patient denies any worsening shortness of breath. She continues to complain of pain in the left chest wall area. Mildly pruritic. No fever no chills no cough. Patient is taking oxycodone 2-3 times a day without significant improvement. She is asking for a long-acting pain medication.   Appetite is good. Denies any weight loss.   REVIEW OF SYSTEMS:  A complete 10 point review of system is done which is negative except mentioned above/history of present illness.   PAST MEDICAL HISTORY :  Past Medical History  Diagnosis Date  . Diabetes mellitus without complication ([place])   . Sleep apnea   . Hypertension   . Aneurysm ([place])   . Hiatal hernia   . COPD (chronic obstructive pulmonary disease) ([place])     on home oxygen  . Illicit drug use   . Migraines   . Hypercholesteremia   . Depression   . Spine disorder   . Family history of chronic pain 01/17/2015  . Lung cancer ([place])     adenocarcinoma left lung    PAST SURGICAL HISTORY :   Past Surgical History  Procedure Laterality Date  . Abdominal hysterectomy    . Hernia repair    . Eye surgery    . Video assisted thoracoscopy (vats)/thorocotomy Left 07/14/2015    Procedure: VIDEO ASSISTED THORACOSCOPY (VATS), pleural biopsy;  Surgeon: Nestor Lewandowsky, MD;   Location: [facility] ORS;  Service: General;  Laterality: Left;  . Portacath placement Left 07/14/2015    Procedure: INSERTION PORT-A-CATH;  Surgeon: Nestor Lewandowsky, MD;  Location: [facility] ORS;  Service: General;  Laterality: Left;  . Removal of pleural drainage catheter N/A 07/18/2015    Procedure: REMOVAL OF PLEURAL DRAINAGE CATHETER;  Surgeon: Nestor Lewandowsky, MD;  Location: [facility] ORS;  Service: Thoracic;  Laterality: N/A;    FAMILY HISTORY :   Family History  Problem Relation Age of Onset  . Heart disease Mother   . Breast cancer Sister     SOCIAL HISTORY:   Social History  Substance Use Topics  . Smoking status: Former Smoker -- 1.50 packs/day for 45 years    Types: Cigarettes    Quit date: 04/29/2015  . Smokeless tobacco: Not on file     Comment: Quit 2 months ago  . Alcohol Use: No    ALLERGIES:  is allergic to ampicillin and penicillins.  MEDICATIONS:  Current Outpatient Prescriptions  Medication Sig Dispense Refill  . albuterol (PROVENTIL HFA;VENTOLIN HFA) 108 (90 Base) MCG/ACT inhaler Inhale 2 puffs into the lungs every 6 (six) hours as needed for wheezing or shortness of breath. 1 Inhaler 2  . alendronate (FOSAMAX) 70 MG tablet Take 70 mg by mouth once a week. Pt takes on Tuesday.   Take with a full glass of water on an empty stomach.    Marland Kitchen apixaban (ELIQUIS) 5 MG TABS tablet Take 1 tablet (5 mg total)  by mouth 2 (two) times daily. 60 tablet 1  . cetirizine (ZYRTEC) 10 MG tablet Take 10 mg by mouth at bedtime.    . DULoxetine (CYMBALTA) 30 MG capsule Take 90 mg by mouth daily.     . enalapril (VASOTEC) 10 MG tablet Take 10 mg by mouth daily.     . fluticasone (FLOVENT HFA) 220 MCG/ACT inhaler Inhale 1 puff into the lungs 2 (two) times daily.    . folic acid (FOLVITE) 1 MG tablet Take 1 mg by mouth daily.    Marland Kitchen gabapentin (NEURONTIN) 800 MG tablet Take 800 mg by mouth 3 (three) times daily.    Marland Kitchen levofloxacin (LEVAQUIN) 500 MG tablet Take 1 tablet (500 mg total) by mouth daily. 5 tablet  0  . lidocaine-prilocaine (EMLA) cream Apply 1 application topically as needed (prior to accessing port).    . magnesium oxide (MAG-OX) 400 (241.3 Mg) MG tablet Take 400 mg by mouth at bedtime.    . metFORMIN (GLUCOPHAGE-XR) 750 MG 24 hr tablet Take 750 mg by mouth daily with breakfast.    . metoprolol tartrate (LOPRESSOR) 25 MG tablet Take 1 tablet (25 mg total) by mouth 2 (two) times daily. 60 tablet 0  . mirtazapine (REMERON) 45 MG tablet Take 45 mg by mouth at bedtime.    Marland Kitchen omeprazole (PRILOSEC) 20 MG capsule Take 20 mg by mouth daily before breakfast.     . ondansetron (ZOFRAN) 8 MG tablet Take 8 mg by mouth every 8 (eight) hours as needed for nausea or vomiting.    Marland Kitchen oxyCODONE-acetaminophen (ROXICET) 5-325 MG tablet Take 1 tablet by mouth every 6 (six) hours as needed for moderate pain or severe pain. 28 tablet 0  . prochlorperazine (COMPAZINE) 10 MG tablet Take 1 tablet (10 mg total) by mouth every 6 (six) hours as needed for nausea or vomiting. 30 tablet 0  . promethazine (PHENERGAN) 25 MG suppository Place 1 suppository (25 mg total) rectally every 6 (six) hours as needed for nausea or vomiting. 12 each 0  . simvastatin (ZOCOR) 20 MG tablet Take 20 mg by mouth at bedtime.     . traZODone (DESYREL) 150 MG tablet Take 150 mg by mouth at bedtime.     No current facility-administered medications for this visit.    PHYSICAL EXAMINATION: ECOG PERFORMANCE STATUS: 1 - Symptomatic but completely ambulatory  BP 161/94 mmHg  Pulse 81  Temp(Src) 97.7 F (36.5 C) (Tympanic)  Ht _0  (1.6 m)  Wt 162 lb 4.1 oz (73.6 kg)  BMI 28.75 kg/m2  Filed Weights   08/30/15 1032  Weight: 162 lb 4.1 oz (73.6 kg)    GENERAL: Well-nourished well-developed; Alert, no distress and comfortable. She is walking with a cane. Accompanied by family. EYES: no pallor or icterus OROPHARYNX: no thrush or ulceration; good dentition  NECK: supple, no masses felt LYMPH:  no palpable lymphadenopathy in the  cervical, axillary or inguinal regions LUNGS: Decreased breath sounds on the left side compared to the right.  No wheeze or crackles HEART/CVS: regular rate & rhythm and no murmurs; right lower extremity swelling/tenderness in the calf. ABDOMEN:abdomen soft, non-tender and normal bowel sounds Musculoskeletal:no cyanosis of digits and no clubbing; palpable swelling in the right arm 5 x 5 cm [stable] PSYCH: alert & oriented x 3 with fluent speech NEURO: no focal motor/sensory deficits SKIN:  no rashes or significant lesions  LABORATORY DATA:  I have reviewed the data as listed    Component Value Date/Time  NA 136 08/30/2015 0940   NA 141 02/15/2013 1152   K 4.1 08/30/2015 0940   K 4.1 02/15/2013 1152   CL 102 08/30/2015 0940   CL 115* 02/15/2013 1152   CO2 28 08/30/2015 0940   CO2 21 02/15/2013 1152   GLUCOSE 100* 08/30/2015 0940   GLUCOSE 85 02/15/2013 1152   BUN 20 08/30/2015 0940   BUN 20* 02/15/2013 1152   CREATININE 0.87 08/30/2015 0940   CREATININE 0.96 02/15/2013 1152   CALCIUM 9.3 08/30/2015 0940   CALCIUM 9.0 02/15/2013 1152   PROT 6.6 07/29/2015 1356   PROT 7.8 07/18/2012 1025   ALBUMIN 2.9* 07/29/2015 1356   ALBUMIN 4.0 07/18/2012 1025   AST 27 07/29/2015 1356   AST 20 07/18/2012 1025   ALT 23 07/29/2015 1356   ALT 20 07/18/2012 1025   ALKPHOS 81 07/29/2015 1356   ALKPHOS 116 07/18/2012 1025   BILITOT 0.5 07/29/2015 1356   BILITOT 0.5 07/18/2012 1025   GFRNONAA >60 08/30/2015 0940   GFRNONAA >60 02/15/2013 1152   GFRAA >60 08/30/2015 0940   GFRAA >60 02/15/2013 1152    No results found for: SPEP, UPEP  Lab Results  Component Value Date   WBC 35.6* 08/30/2015   NEUTROABS PENDING 08/30/2015   HGB 10.0* 08/30/2015   HCT 30.6* 08/30/2015   MCV 83.2 08/30/2015   PLT 314 08/30/2015      Chemistry      Component Value Date/Time   NA 136 08/30/2015 0940   NA 141 02/15/2013 1152   K 4.1 08/30/2015 0940   K 4.1 02/15/2013 1152   CL 102 08/30/2015 0940    CL 115* 02/15/2013 1152   CO2 28 08/30/2015 0940   CO2 21 02/15/2013 1152   BUN 20 08/30/2015 0940   BUN 20* 02/15/2013 1152   CREATININE 0.87 08/30/2015 0940   CREATININE 0.96 02/15/2013 1152       Component Value Date/Time   CALCIUM 9.3 08/30/2015 0940   CALCIUM 9.0 02/15/2013 1152   ALKPHOS 81 07/29/2015 1356   ALKPHOS 116 07/18/2012 1025   AST 27 07/29/2015 1356   AST 20 07/18/2012 1025   ALT 23 07/29/2015 1356   ALT 20 07/18/2012 1025   BILITOT 0.5 07/29/2015 1356   BILITOT 0.5 07/18/2012 1025        ASSESSMENT & PLAN:   # LEFT LOWER LUNG ADENOCARCINOMA- STAGE IV- Status post cycle #2 palliative chemotherapy with carboplatin and Alimta; Keytruda appx 10 days ago.Will plan CT scan after cycle #3. Discussed with the patient that this is incurable; and treatments of palliative ongoing/indefinite.    # PE- on anti-coagulation with eliquis.  # LEFT LUNG MALIGNANT EFFUSION- s/p Pleurodesis. Symptomatically seems to improve her breathing.  # PAIN CONTROL-add MS contin 15 bid. Continue oxycodone as needed. Prescriptions given.  # Follow-up on June 21 for cycle #3 of carbo Alimta Keytruda.     Cammie Sickle, MD 08/30/2015 10:56 AM

## 2015-08-31 ENCOUNTER — Encounter: Payer: Self-pay | Admitting: *Deleted

## 2015-08-31 NOTE — Progress Notes (Signed)
RN received fax/msg from [email or web address] asking for md to PA the MSContin 15 mg tabs.  Unable to complete this request with cover my meds as pt now has [place] Medicare/Medicaid. RN submitted request with [employer].  Pending insurance approval.

## 2015-09-01 ENCOUNTER — Other Ambulatory Visit: Payer: Self-pay | Admitting: Internal Medicine

## 2015-09-07 ENCOUNTER — Encounter: Payer: Self-pay | Admitting: *Deleted

## 2015-09-07 NOTE — Progress Notes (Signed)
Completed documentation for [employer] rx and faxed for MsContin 15 mg. [employer] denied prior auth as pt as medicare part D.

## 2015-09-13 ENCOUNTER — Inpatient Hospital Stay: Payer: Medicare Other

## 2015-09-13 ENCOUNTER — Inpatient Hospital Stay (HOSPITAL_BASED_OUTPATIENT_CLINIC_OR_DEPARTMENT_OTHER): Payer: Medicare Other | Admitting: Internal Medicine

## 2015-09-13 VITALS — BP 99/60 | HR 74 | Resp 20

## 2015-09-13 VITALS — BP 101/64 | HR 89 | Temp 97.4°F | Resp 18 | Wt 160.8 lb

## 2015-09-13 DIAGNOSIS — E119 Type 2 diabetes mellitus without complications: Secondary | ICD-10-CM

## 2015-09-13 DIAGNOSIS — J449 Chronic obstructive pulmonary disease, unspecified: Secondary | ICD-10-CM

## 2015-09-13 DIAGNOSIS — F329 Major depressive disorder, single episode, unspecified: Secondary | ICD-10-CM

## 2015-09-13 DIAGNOSIS — R7989 Other specified abnormal findings of blood chemistry: Secondary | ICD-10-CM

## 2015-09-13 DIAGNOSIS — Z7689 Persons encountering health services in other specified circumstances: Secondary | ICD-10-CM | POA: Diagnosis not present

## 2015-09-13 DIAGNOSIS — G4730 Sleep apnea, unspecified: Secondary | ICD-10-CM

## 2015-09-13 DIAGNOSIS — E7800 Pure hypercholesterolemia, unspecified: Secondary | ICD-10-CM

## 2015-09-13 DIAGNOSIS — R079 Chest pain, unspecified: Secondary | ICD-10-CM

## 2015-09-13 DIAGNOSIS — K449 Diaphragmatic hernia without obstruction or gangrene: Secondary | ICD-10-CM

## 2015-09-13 DIAGNOSIS — Z5111 Encounter for antineoplastic chemotherapy: Secondary | ICD-10-CM | POA: Diagnosis not present

## 2015-09-13 DIAGNOSIS — J910 Malignant pleural effusion: Secondary | ICD-10-CM

## 2015-09-13 DIAGNOSIS — Z79899 Other long term (current) drug therapy: Secondary | ICD-10-CM

## 2015-09-13 DIAGNOSIS — Z7901 Long term (current) use of anticoagulants: Secondary | ICD-10-CM

## 2015-09-13 DIAGNOSIS — C3492 Malignant neoplasm of unspecified part of left bronchus or lung: Secondary | ICD-10-CM

## 2015-09-13 DIAGNOSIS — Z803 Family history of malignant neoplasm of breast: Secondary | ICD-10-CM

## 2015-09-13 DIAGNOSIS — I10 Essential (primary) hypertension: Secondary | ICD-10-CM

## 2015-09-13 DIAGNOSIS — Z7984 Long term (current) use of oral hypoglycemic drugs: Secondary | ICD-10-CM

## 2015-09-13 DIAGNOSIS — C3432 Malignant neoplasm of lower lobe, left bronchus or lung: Secondary | ICD-10-CM

## 2015-09-13 DIAGNOSIS — Z87891 Personal history of nicotine dependence: Secondary | ICD-10-CM

## 2015-09-13 LAB — CBC WITH DIFFERENTIAL/PLATELET
BASOS PCT: 1 %
Basophils Absolute: 0.1 10*3/uL (ref 0–0.1)
EOS ABS: 0.2 10*3/uL (ref 0–0.7)
EOS PCT: 3 %
HCT: 32.1 % — ABNORMAL LOW (ref 35.0–47.0)
Hemoglobin: 10.7 g/dL — ABNORMAL LOW (ref 12.0–16.0)
Lymphocytes Relative: 15 %
Lymphs Abs: 1.2 10*3/uL (ref 1.0–3.6)
MCH: 28.6 pg (ref 26.0–34.0)
MCHC: 33.4 g/dL (ref 32.0–36.0)
MCV: 85.7 fL (ref 80.0–100.0)
MONO ABS: 0.3 10*3/uL (ref 0.2–0.9)
Monocytes Relative: 4 %
NEUTROS ABS: 6.3 10*3/uL (ref 1.4–6.5)
Neutrophils Relative %: 77 %
PLATELETS: 310 10*3/uL (ref 150–440)
RBC: 3.75 MIL/uL — ABNORMAL LOW (ref 3.80–5.20)
RDW: 20.4 % — AB (ref 11.5–14.5)
WBC: 8.2 10*3/uL (ref 3.6–11.0)

## 2015-09-13 LAB — COMPREHENSIVE METABOLIC PANEL
ALBUMIN: 3.7 g/dL (ref 3.5–5.0)
ALT: 20 U/L (ref 14–54)
AST: 22 U/L (ref 15–41)
Alkaline Phosphatase: 119 U/L (ref 38–126)
Anion gap: 7 (ref 5–15)
BUN: 24 mg/dL — AB (ref 6–20)
CHLORIDE: 101 mmol/L (ref 101–111)
CO2: 26 mmol/L (ref 22–32)
CREATININE: 1.08 mg/dL — AB (ref 0.44–1.00)
Calcium: 9.8 mg/dL (ref 8.9–10.3)
GFR calc Af Amer: >60 mL/min (ref >60)
GFR calc non Af Amer: 54 mL/min — ABNORMAL LOW (ref >60)
GLUCOSE: 123 mg/dL — AB (ref 65–99)
Potassium: 4.4 mmol/L (ref 3.5–5.1)
SODIUM: 134 mmol/L — AB (ref 135–145)
Total Bilirubin: 0.2 mg/dL — ABNORMAL LOW (ref 0.3–1.2)
Total Protein: 7.4 g/dL (ref 6.5–8.1)

## 2015-09-13 MED ORDER — SODIUM CHLORIDE 0.9 % IV SOLN
450.0000 mg | Freq: Once | INTRAVENOUS | Status: AC
  Administered 2015-09-13: 450 mg via INTRAVENOUS
  Filled 2015-09-13: qty 45

## 2015-09-13 MED ORDER — SODIUM CHLORIDE 0.9 % IV SOLN
500.0000 mg/m2 | Freq: Once | INTRAVENOUS | Status: AC
  Administered 2015-09-13: 925 mg via INTRAVENOUS
  Filled 2015-09-13: qty 37

## 2015-09-13 MED ORDER — CYANOCOBALAMIN 1000 MCG/ML IJ SOLN
1000.0000 ug | Freq: Once | INTRAMUSCULAR | Status: AC
  Administered 2015-09-13: 1000 ug via INTRAMUSCULAR
  Filled 2015-09-13: qty 1

## 2015-09-13 MED ORDER — PALONOSETRON HCL INJECTION 0.25 MG/5ML
0.2500 mg | Freq: Once | INTRAVENOUS | Status: AC
  Administered 2015-09-13: 0.25 mg via INTRAVENOUS
  Filled 2015-09-13: qty 5

## 2015-09-13 MED ORDER — SODIUM CHLORIDE 0.9% FLUSH
10.0000 mL | INTRAVENOUS | Status: DC | PRN
  Administered 2015-09-13: 10 mL via INTRAVENOUS
  Filled 2015-09-13: qty 10

## 2015-09-13 MED ORDER — PEGFILGRASTIM 6 MG/0.6ML ~~LOC~~ PSKT
6.0000 mg | PREFILLED_SYRINGE | Freq: Once | SUBCUTANEOUS | Status: AC
  Administered 2015-09-13: 6 mg via SUBCUTANEOUS
  Filled 2015-09-13: qty 0.6

## 2015-09-13 MED ORDER — HEPARIN SOD (PORK) LOCK FLUSH 100 UNIT/ML IV SOLN
500.0000 [IU] | Freq: Once | INTRAVENOUS | Status: AC
  Filled 2015-09-13: qty 5

## 2015-09-13 MED ORDER — HEPARIN SOD (PORK) LOCK FLUSH 100 UNIT/ML IV SOLN
500.0000 [IU] | Freq: Once | INTRAVENOUS | Status: AC | PRN
  Administered 2015-09-13: 500 [IU]

## 2015-09-13 MED ORDER — SODIUM CHLORIDE 0.9 % IV SOLN
200.0000 mg | Freq: Once | INTRAVENOUS | Status: AC
  Administered 2015-09-13: 200 mg via INTRAVENOUS
  Filled 2015-09-13: qty 8

## 2015-09-13 MED ORDER — SODIUM CHLORIDE 0.9 % IV SOLN
10.0000 mg | Freq: Once | INTRAVENOUS | Status: AC
  Administered 2015-09-13: 10 mg via INTRAVENOUS
  Filled 2015-09-13: qty 1

## 2015-09-13 NOTE — Progress Notes (Signed)
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; [PDL-1/EGFR-ALK-Pending] May 4th Carbo-Alimta q3 W; added keytruda with #2   # May 2017-RUL & LL- PE on Eliquis  # LEFT LUNG MALIGNANT EFFUSION- s/p Pleurodesis [[facility]]     Primary cancer of left lung metastatic to other site [facility])   07/19/2015 Initial Diagnosis Primary cancer of left lung metastatic to other site [facility])     INTERVAL HISTORY:  A pleasant 60 year old female patient with above history of poorly diagnosed adenocarcinoma left lower lobe of the lung with left-sided malignant effusion/stage IV- currently status post cycle #3 of carboplatin-Alimta-keytruda is here for follow-up.  Patient states her right shoulder lesion has resolved.   Patient denies any nausea vomiting diarrhea. Denies any worsening chest pain. She continues to be on pain pills for chronic pain secondary to pleurodesis.   Appetite is good. Denies any weight loss.   REVIEW OF SYSTEMS:  A complete 10 point review of system is done which is negative except mentioned above/history of present illness.   PAST MEDICAL HISTORY :  Past Medical History  Diagnosis Date  . Diabetes mellitus without complication ([place])   . Sleep apnea   . Hypertension   . Aneurysm ([place])   . Hiatal hernia   . COPD (chronic obstructive pulmonary disease) ([place])     on home oxygen  . Illicit drug use   . Migraines   . Hypercholesteremia   . Depression   . Spine disorder   . Family history of chronic pain 01/17/2015  . Lung cancer ([place])     adenocarcinoma left lung  . Chest pain, pleuritic     PAST SURGICAL HISTORY :   Past Surgical History  Procedure Laterality Date  . Abdominal hysterectomy    . Hernia repair    . Eye surgery    . Video assisted  thoracoscopy (vats)/thorocotomy Left 07/14/2015    Procedure: VIDEO ASSISTED THORACOSCOPY (VATS), pleural biopsy;  Surgeon: Nestor Lewandowsky, MD;  Location: [facility] ORS;  Service: General;  Laterality: Left;  . Portacath placement Left 07/14/2015    Procedure: INSERTION PORT-A-CATH;  Surgeon: Nestor Lewandowsky, MD;  Location: [facility] ORS;  Service: General;  Laterality: Left;  . Removal of pleural drainage catheter N/A 07/18/2015    Procedure: REMOVAL OF PLEURAL DRAINAGE CATHETER;  Surgeon: Nestor Lewandowsky, MD;  Location: [facility] ORS;  Service: Thoracic;  Laterality: N/A;    FAMILY HISTORY :   Family History  Problem Relation Age of Onset  . Heart disease Mother   . Breast cancer Sister     SOCIAL HISTORY:   Social History  Substance Use Topics  . Smoking status: Former Smoker -- 1.50 packs/day for 45 years    Types: Cigarettes    Quit date: 04/29/2015  . Smokeless tobacco: Not on file     Comment: Quit 2 months ago  . Alcohol Use: No    ALLERGIES:  is allergic to ampicillin and penicillins.  MEDICATIONS:  Current Outpatient Prescriptions  Medication Sig Dispense Refill  . albuterol (PROVENTIL HFA;VENTOLIN HFA) 108 (90 Base) MCG/ACT inhaler Inhale 2 puffs into the lungs every 6 (six) hours as needed for wheezing or shortness of breath. 1 Inhaler 2  . alendronate (FOSAMAX) 70 MG tablet Take 70 mg by mouth once a week. Pt takes on  Tuesday.   Take with a full glass of water on an empty stomach.    Marland Kitchen apixaban (ELIQUIS) 5 MG TABS tablet Take 1 tablet (5 mg total) by mouth 2 (two) times daily. 60 tablet 1  . cetirizine (ZYRTEC) 10 MG tablet Take 10 mg by mouth at bedtime.    . DULoxetine (CYMBALTA) 30 MG capsule Take 90 mg by mouth daily.     . enalapril (VASOTEC) 10 MG tablet Take 10 mg by mouth daily.     . fluticasone (FLOVENT HFA) 220 MCG/ACT inhaler Inhale 1 puff into the lungs 2 (two) times daily.    . folic acid (FOLVITE) 1 MG tablet Take 1 mg by mouth daily.    Marland Kitchen gabapentin (NEURONTIN) 800 MG  tablet Take 800 mg by mouth 3 (three) times daily.    Marland Kitchen levofloxacin (LEVAQUIN) 500 MG tablet Take 1 tablet (500 mg total) by mouth daily. 5 tablet 0  . lidocaine-prilocaine (EMLA) cream Apply 1 application topically as needed (prior to accessing port).    . magnesium oxide (MAG-OX) 400 (241.3 Mg) MG tablet Take 400 mg by mouth at bedtime.    . metFORMIN (GLUCOPHAGE-XR) 750 MG 24 hr tablet Take 750 mg by mouth daily with breakfast.    . metoprolol tartrate (LOPRESSOR) 25 MG tablet Take 1 tablet (25 mg total) by mouth 2 (two) times daily. 60 tablet 0  . mirtazapine (REMERON) 45 MG tablet Take 45 mg by mouth at bedtime.    Marland Kitchen morphine (MS CONTIN) 15 MG 12 hr tablet Take 2 tablets (30 mg total) by mouth every 12 (twelve) hours. 60 tablet 0  . omeprazole (PRILOSEC) 20 MG capsule Take 20 mg by mouth daily before breakfast.     . ondansetron (ZOFRAN) 8 MG tablet Take 8 mg by mouth every 8 (eight) hours as needed for nausea or vomiting.    Marland Kitchen oxyCODONE-acetaminophen (ROXICET) 5-325 MG tablet Take 1 tablet by mouth every 8 (eight) hours as needed for moderate pain or severe pain. 90 tablet 0  . prochlorperazine (COMPAZINE) 10 MG tablet TAKE 1 TABLET (10MG) BY MOUTH EVERY SIX HOURS AS NEEDED FOR NAUSEA OR VOMITING 30 tablet 1  . promethazine (PHENERGAN) 25 MG suppository Place 1 suppository (25 mg total) rectally every 6 (six) hours as needed for nausea or vomiting. 12 each 0  . simvastatin (ZOCOR) 20 MG tablet Take 20 mg by mouth at bedtime.     . traZODone (DESYREL) 150 MG tablet Take 150 mg by mouth at bedtime.     No current facility-administered medications for this visit.    PHYSICAL EXAMINATION: ECOG PERFORMANCE STATUS: 1 - Symptomatic but completely ambulatory  BP 101/64 mmHg  Pulse 89  Temp(Src) 97.4 F (36.3 C) (Tympanic)  Resp 18  Wt 160 lb 13.2 oz (72.95 kg)  Filed Weights   09/13/15 0857  Weight: 160 lb 13.2 oz (72.95 kg)    GENERAL: Well-nourished well-developed; Alert, no  distress and comfortable. She is walking with a cane. Accompanied by family. EYES: no pallor or icterus OROPHARYNX: no thrush or ulceration; good dentition  NECK: supple, no masses felt LYMPH:  no palpable lymphadenopathy in the cervical, axillary or inguinal regions LUNGS: Decreased breath sounds on the left side compared to the right.  No wheeze or crackles HEART/CVS: regular rate & rhythm and no murmurs; right lower extremity swelling/tenderness in the calf. ABDOMEN:abdomen soft, non-tender and normal bowel sounds Musculoskeletal:no cyanosis of digits and no clubbing; palpable swelling in the right arm-resolved [  previously 5 x 5 cm ] PSYCH: alert & oriented x 3 with fluent speech NEURO: no focal motor/sensory deficits SKIN:  no rashes or significant lesions  LABORATORY DATA:  I have reviewed the data as listed    Component Value Date/Time   NA 134* 09/13/2015 0840   NA 141 02/15/2013 1152   K 4.4 09/13/2015 0840   K 4.1 02/15/2013 1152   CL 101 09/13/2015 0840   CL 115* 02/15/2013 1152   CO2 26 09/13/2015 0840   CO2 21 02/15/2013 1152   GLUCOSE 123* 09/13/2015 0840   GLUCOSE 85 02/15/2013 1152   BUN 24* 09/13/2015 0840   BUN 20* 02/15/2013 1152   CREATININE 1.08* 09/13/2015 0840   CREATININE 0.96 02/15/2013 1152   CALCIUM 9.8 09/13/2015 0840   CALCIUM 9.0 02/15/2013 1152   PROT 7.4 09/13/2015 0840   PROT 7.8 07/18/2012 1025   ALBUMIN 3.7 09/13/2015 0840   ALBUMIN 4.0 07/18/2012 1025   AST 22 09/13/2015 0840   AST 20 07/18/2012 1025   ALT 20 09/13/2015 0840   ALT 20 07/18/2012 1025   ALKPHOS 119 09/13/2015 0840   ALKPHOS 116 07/18/2012 1025   BILITOT 0.2* 09/13/2015 0840   BILITOT 0.5 07/18/2012 1025   GFRNONAA 54* 09/13/2015 0840   GFRNONAA >60 02/15/2013 1152   GFRAA >60 09/13/2015 0840   GFRAA >60 02/15/2013 1152    No results found for: SPEP, UPEP  Lab Results  Component Value Date   WBC 8.2 09/13/2015   NEUTROABS 6.3 09/13/2015   HGB 10.7* 09/13/2015    HCT 32.1* 09/13/2015   MCV 85.7 09/13/2015   PLT 310 09/13/2015      Chemistry      Component Value Date/Time   NA 134* 09/13/2015 0840   NA 141 02/15/2013 1152   K 4.4 09/13/2015 0840   K 4.1 02/15/2013 1152   CL 101 09/13/2015 0840   CL 115* 02/15/2013 1152   CO2 26 09/13/2015 0840   CO2 21 02/15/2013 1152   BUN 24* 09/13/2015 0840   BUN 20* 02/15/2013 1152   CREATININE 1.08* 09/13/2015 0840   CREATININE 0.96 02/15/2013 1152       Component Value Date/Time   CALCIUM 9.8 09/13/2015 0840   CALCIUM 9.0 02/15/2013 1152   ALKPHOS 119 09/13/2015 0840   ALKPHOS 116 07/18/2012 1025   AST 22 09/13/2015 0840   AST 20 07/18/2012 1025   ALT 20 09/13/2015 0840   ALT 20 07/18/2012 1025   BILITOT 0.2* 09/13/2015 0840   BILITOT 0.5 07/18/2012 1025        ASSESSMENT & PLAN:   Primary cancer of left lung metastatic to other site [facility]) # LEFT LOWER LUNG ADENOCARCINOMA- STAGE IV- Status post cycle #2 palliative chemotherapy with carboplatin and Alimta; Keytruda. clinically no evidence of progression noted. Response noted especially with improvement of her right shoulder mass.  # Proceed with cycle #3 today. Labs are adequate.  # PE- on anti-coagulation with eliquis.  # PAIN CONTROL-add MS contin 15 bid. Continue oxycodone as needed.  # Follow-up in 10 days for symptom management. Discussed that we will get a scan after cycle; this will be ordered at next visit/ten days.       Cammie Sickle, MD 09/13/2015 7:10 PM

## 2015-09-13 NOTE — Progress Notes (Signed)
Patient is here for a follow up, complains of aching on the left side of her ribs, where she had her surgery.

## 2015-09-13 NOTE — Assessment & Plan Note (Addendum)
#   LEFT LOWER LUNG ADENOCARCINOMA- STAGE IV- Status post cycle #2 palliative chemotherapy with carboplatin and Alimta; Keytruda. clinically no evidence of progression noted. Response noted especially with improvement of her right shoulder mass.  # Proceed with cycle #3 today. Labs are adequate.  # PE- on anti-coagulation with eliquis.  # PAIN CONTROL-add MS contin 15 bid. Continue oxycodone as needed.  # Follow-up in 10 days for symptom management. Discussed that we will get a scan after cycle; this will be ordered at next visit/ten days.

## 2015-09-17 ENCOUNTER — Telehealth: Payer: Self-pay | Admitting: Hematology and Oncology

## 2015-09-17 NOTE — Telephone Encounter (Signed)
Re:  Not feeling well  Patient notes vomiting, headache and dizziness.  She states that her blood pressure medications were discontinued by Dr. Rogue Bussing when she came in for chemotherapy on 09/13/2015.  She is going to have her blood pressure checked, and if high, she states that she is going to the ER.  Lequita Asal, MD

## 2015-09-22 ENCOUNTER — Inpatient Hospital Stay: Payer: Medicare Other

## 2015-09-22 ENCOUNTER — Inpatient Hospital Stay: Payer: Medicare Other | Admitting: Internal Medicine

## 2015-10-02 ENCOUNTER — Other Ambulatory Visit: Payer: Self-pay | Admitting: *Deleted

## 2015-10-02 DIAGNOSIS — C3432 Malignant neoplasm of lower lobe, left bronchus or lung: Secondary | ICD-10-CM

## 2015-10-02 MED ORDER — OXYCODONE-ACETAMINOPHEN 5-325 MG PO TABS: 1.0000 | ORAL_TABLET | Freq: Three times a day (TID) | ORAL | Status: DC | PRN

## 2015-10-04 ENCOUNTER — Inpatient Hospital Stay: Payer: Medicare Other

## 2015-10-04 ENCOUNTER — Inpatient Hospital Stay: Payer: Medicare Other | Attending: Internal Medicine | Admitting: Internal Medicine

## 2015-10-04 VITALS — BP 120/76 | HR 84 | Temp 97.0°F | Resp 18 | Wt 159.6 lb

## 2015-10-04 DIAGNOSIS — E119 Type 2 diabetes mellitus without complications: Secondary | ICD-10-CM | POA: Insufficient documentation

## 2015-10-04 DIAGNOSIS — Z8669 Personal history of other diseases of the nervous system and sense organs: Secondary | ICD-10-CM | POA: Diagnosis not present

## 2015-10-04 DIAGNOSIS — Z5111 Encounter for antineoplastic chemotherapy: Secondary | ICD-10-CM | POA: Insufficient documentation

## 2015-10-04 DIAGNOSIS — R0789 Other chest pain: Secondary | ICD-10-CM | POA: Insufficient documentation

## 2015-10-04 DIAGNOSIS — R109 Unspecified abdominal pain: Secondary | ICD-10-CM | POA: Insufficient documentation

## 2015-10-04 DIAGNOSIS — Z7901 Long term (current) use of anticoagulants: Secondary | ICD-10-CM | POA: Insufficient documentation

## 2015-10-04 DIAGNOSIS — Z86711 Personal history of pulmonary embolism: Secondary | ICD-10-CM | POA: Diagnosis not present

## 2015-10-04 DIAGNOSIS — C3492 Malignant neoplasm of unspecified part of left bronchus or lung: Secondary | ICD-10-CM

## 2015-10-04 DIAGNOSIS — J449 Chronic obstructive pulmonary disease, unspecified: Secondary | ICD-10-CM

## 2015-10-04 DIAGNOSIS — F17210 Nicotine dependence, cigarettes, uncomplicated: Secondary | ICD-10-CM | POA: Insufficient documentation

## 2015-10-04 DIAGNOSIS — G4730 Sleep apnea, unspecified: Secondary | ICD-10-CM | POA: Insufficient documentation

## 2015-10-04 DIAGNOSIS — C3432 Malignant neoplasm of lower lobe, left bronchus or lung: Secondary | ICD-10-CM

## 2015-10-04 DIAGNOSIS — I10 Essential (primary) hypertension: Secondary | ICD-10-CM

## 2015-10-04 DIAGNOSIS — Z7984 Long term (current) use of oral hypoglycemic drugs: Secondary | ICD-10-CM | POA: Insufficient documentation

## 2015-10-04 DIAGNOSIS — M7989 Other specified soft tissue disorders: Secondary | ICD-10-CM

## 2015-10-04 DIAGNOSIS — J910 Malignant pleural effusion: Secondary | ICD-10-CM | POA: Diagnosis not present

## 2015-10-04 DIAGNOSIS — F329 Major depressive disorder, single episode, unspecified: Secondary | ICD-10-CM | POA: Insufficient documentation

## 2015-10-04 DIAGNOSIS — K449 Diaphragmatic hernia without obstruction or gangrene: Secondary | ICD-10-CM | POA: Diagnosis not present

## 2015-10-04 DIAGNOSIS — Z7689 Persons encountering health services in other specified circumstances: Secondary | ICD-10-CM | POA: Insufficient documentation

## 2015-10-04 DIAGNOSIS — E7800 Pure hypercholesterolemia, unspecified: Secondary | ICD-10-CM

## 2015-10-04 DIAGNOSIS — Z79899 Other long term (current) drug therapy: Secondary | ICD-10-CM | POA: Diagnosis not present

## 2015-10-04 DIAGNOSIS — K5900 Constipation, unspecified: Secondary | ICD-10-CM | POA: Diagnosis not present

## 2015-10-04 DIAGNOSIS — F419 Anxiety disorder, unspecified: Secondary | ICD-10-CM | POA: Diagnosis not present

## 2015-10-04 LAB — CBC WITH DIFFERENTIAL/PLATELET
BASOS ABS: 0.1 10*3/uL (ref 0–0.1)
BASOS PCT: 1 %
Eosinophils Absolute: 0.2 10*3/uL (ref 0–0.7)
Eosinophils Relative: 3 %
HEMATOCRIT: 33.4 % — AB (ref 35.0–47.0)
HEMOGLOBIN: 11.1 g/dL — AB (ref 12.0–16.0)
LYMPHS PCT: 33 %
Lymphs Abs: 2.6 10*3/uL (ref 1.0–3.6)
MCH: 28.1 pg (ref 26.0–34.0)
MCHC: 33.1 g/dL (ref 32.0–36.0)
MCV: 84.9 fL (ref 80.0–100.0)
MONO ABS: 1 10*3/uL — AB (ref 0.2–0.9)
Monocytes Relative: 13 %
NEUTROS ABS: 4.1 10*3/uL (ref 1.4–6.5)
NEUTROS PCT: 50 %
Platelets: 316 10*3/uL (ref 150–440)
RBC: 3.93 MIL/uL (ref 3.80–5.20)
RDW: 22.1 % — ABNORMAL HIGH (ref 11.5–14.5)
WBC: 8 10*3/uL (ref 3.6–11.0)

## 2015-10-04 LAB — COMPREHENSIVE METABOLIC PANEL
ALBUMIN: 3.5 g/dL (ref 3.5–5.0)
ALK PHOS: 130 U/L — AB (ref 38–126)
ALT: 15 U/L (ref 14–54)
AST: 19 U/L (ref 15–41)
Anion gap: 6 (ref 5–15)
BILIRUBIN TOTAL: 0.3 mg/dL (ref 0.3–1.2)
BUN: 14 mg/dL (ref 6–20)
CALCIUM: 9.5 mg/dL (ref 8.9–10.3)
CO2: 26 mmol/L (ref 22–32)
Chloride: 103 mmol/L (ref 101–111)
Creatinine, Ser: 0.94 mg/dL (ref 0.44–1.00)
GFR calc Af Amer: >60 mL/min (ref >60)
GLUCOSE: 105 mg/dL — AB (ref 65–99)
POTASSIUM: 4.4 mmol/L (ref 3.5–5.1)
Sodium: 135 mmol/L (ref 135–145)
TOTAL PROTEIN: 7.9 g/dL (ref 6.5–8.1)

## 2015-10-04 MED ORDER — SODIUM CHLORIDE 0.9% FLUSH
10.0000 mL | INTRAVENOUS | Status: DC | PRN
  Administered 2015-10-04: 10 mL
  Filled 2015-10-04: qty 10

## 2015-10-04 MED ORDER — SODIUM CHLORIDE 0.9 % IV SOLN
500.0000 mg/m2 | Freq: Once | INTRAVENOUS | Status: AC
  Administered 2015-10-04: 925 mg via INTRAVENOUS
  Filled 2015-10-04: qty 37

## 2015-10-04 MED ORDER — HEPARIN SOD (PORK) LOCK FLUSH 100 UNIT/ML IV SOLN
500.0000 [IU] | Freq: Once | INTRAVENOUS | Status: AC | PRN
  Administered 2015-10-04: 500 [IU]
  Filled 2015-10-04: qty 5

## 2015-10-04 MED ORDER — MORPHINE SULFATE ER 30 MG PO TBCR: 30.0000 mg | EXTENDED_RELEASE_TABLET | Freq: Two times a day (BID) | ORAL | Status: DC

## 2015-10-04 MED ORDER — SODIUM CHLORIDE 0.9 % IV SOLN
Freq: Once | INTRAVENOUS | Status: AC
  Filled 2015-10-04: qty 1000

## 2015-10-04 MED ORDER — SODIUM CHLORIDE 0.9 % IV SOLN
Freq: Once | INTRAVENOUS | Status: AC
  Administered 2015-10-04: 12:00:00 via INTRAVENOUS
  Filled 2015-10-04: qty 1000

## 2015-10-04 MED ORDER — PEGFILGRASTIM 6 MG/0.6ML ~~LOC~~ PSKT
6.0000 mg | PREFILLED_SYRINGE | Freq: Once | SUBCUTANEOUS | Status: AC
  Administered 2015-10-04: 6 mg via SUBCUTANEOUS
  Filled 2015-10-04: qty 0.6

## 2015-10-04 MED ORDER — DEXAMETHASONE SODIUM PHOSPHATE 100 MG/10ML IJ SOLN
10.0000 mg | Freq: Once | INTRAMUSCULAR | Status: AC
  Administered 2015-10-04: 10 mg via INTRAVENOUS
  Filled 2015-10-04: qty 1

## 2015-10-04 MED ORDER — SODIUM CHLORIDE 0.9 % IV SOLN
450.0000 mg | Freq: Once | INTRAVENOUS | Status: AC
  Administered 2015-10-04: 450 mg via INTRAVENOUS
  Filled 2015-10-04: qty 45

## 2015-10-04 MED ORDER — SODIUM CHLORIDE 0.9 % IV SOLN
200.0000 mg | Freq: Once | INTRAVENOUS | Status: AC
  Administered 2015-10-04: 200 mg via INTRAVENOUS
  Filled 2015-10-04: qty 8

## 2015-10-04 MED ORDER — PALONOSETRON HCL INJECTION 0.25 MG/5ML
0.2500 mg | Freq: Once | INTRAVENOUS | Status: AC
  Administered 2015-10-04: 0.25 mg via INTRAVENOUS
  Filled 2015-10-04: qty 5

## 2015-10-04 NOTE — Progress Notes (Signed)
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;May 4th Carbo-Alimta q3 W; added keytruda with #2   # May 2017-RUL & LL- PE on Eliquis  # LEFT LUNG MALIGNANT EFFUSION- s/p Pleurodesis [[facility]]   # MOLECULAR STUDIES-  [PDL-1/EGFR-ALK-NEG]; check F-1 [[id number]]     Primary cancer of left lower lobe of lung ([place])   07/19/2015 Initial Diagnosis Primary cancer of left lower lobe of lung ([place])     INTERVAL HISTORY:  A pleasant 60 year old female patient with above history of poorly diagnosed adenocarcinoma left lower lobe of the lung with left-sided malignant effusion/stage IV- currently status post cycle #3 of carboplatin-Alimta-keytruda is here for follow-up.  Patient continues to notice tumor of the right shoulder lesion; she however continues to complain of of the left chest wall pain/ and also noted to have worsening pain in the right upper extremity.She continues to be on pain pills for chronic pain secondary to pleurodesis.   Patient denies any nausea vomiting diarrhea.   Appetite is good. Denies any weight loss.  REVIEW OF SYSTEMS:  A complete 10 point review of system is done which is negative except mentioned above/history of present illness.   PAST MEDICAL HISTORY :  Past Medical History  Diagnosis Date  . Diabetes mellitus without complication ([place])   . Sleep apnea   . Hypertension   . Aneurysm ([place])   . Hiatal hernia   . COPD (chronic obstructive pulmonary disease) ([place])     on home oxygen  . Illicit drug use   . Migraines   . Hypercholesteremia   . Depression   . Spine disorder   . Family history of chronic pain 01/17/2015  . Lung cancer ([place])     adenocarcinoma left lung  . Chest pain, pleuritic     PAST SURGICAL HISTORY :   Past Surgical  History  Procedure Laterality Date  . Abdominal hysterectomy    . Hernia repair    . Eye surgery    . Video assisted thoracoscopy (vats)/thorocotomy Left 07/14/2015    Procedure: VIDEO ASSISTED THORACOSCOPY (VATS), pleural biopsy;  Surgeon: Nestor Lewandowsky, MD;  Location: [facility] ORS;  Service: General;  Laterality: Left;  . Portacath placement Left 07/14/2015    Procedure: INSERTION PORT-A-CATH;  Surgeon: Nestor Lewandowsky, MD;  Location: [facility] ORS;  Service: General;  Laterality: Left;  . Removal of pleural drainage catheter N/A 07/18/2015    Procedure: REMOVAL OF PLEURAL DRAINAGE CATHETER;  Surgeon: Nestor Lewandowsky, MD;  Location: [facility] ORS;  Service: Thoracic;  Laterality: N/A;    FAMILY HISTORY :   Family History  Problem Relation Age of Onset  . Heart disease Mother   . Breast cancer Sister     SOCIAL HISTORY:   Social History  Substance Use Topics  . Smoking status: Former Smoker -- 1.50 packs/day for 45 years    Types: Cigarettes    Quit date: 04/29/2015  . Smokeless tobacco: Not on file     Comment: Quit 2 months ago  . Alcohol Use: No    ALLERGIES:  is allergic to ampicillin and penicillins.  MEDICATIONS:  Current Outpatient Prescriptions  Medication Sig Dispense Refill  . albuterol (PROVENTIL HFA;VENTOLIN HFA) 108 (90 Base) MCG/ACT inhaler Inhale 2 puffs into the lungs every 6 (six) hours as needed for wheezing  or shortness of breath. 1 Inhaler 2  . alendronate (FOSAMAX) 70 MG tablet Take 70 mg by mouth once a week. Pt takes on Tuesday.   Take with a full glass of water on an empty stomach.    Marland Kitchen apixaban (ELIQUIS) 5 MG TABS tablet Take 1 tablet (5 mg total) by mouth 2 (two) times daily. 60 tablet 1  . cetirizine (ZYRTEC) 10 MG tablet Take 10 mg by mouth at bedtime.    . DULoxetine (CYMBALTA) 30 MG capsule Take 90 mg by mouth daily.     . fluticasone (FLOVENT HFA) 220 MCG/ACT inhaler Inhale 1 puff into the lungs 2 (two) times daily.    . folic acid (FOLVITE) 1 MG tablet Take 1 mg by  mouth daily.    Marland Kitchen gabapentin (NEURONTIN) 800 MG tablet Take 800 mg by mouth 3 (three) times daily.    Marland Kitchen levofloxacin (LEVAQUIN) 500 MG tablet Take 1 tablet (500 mg total) by mouth daily. 5 tablet 0  . lidocaine-prilocaine (EMLA) cream Apply 1 application topically as needed (prior to accessing port).    . magnesium oxide (MAG-OX) 400 (241.3 Mg) MG tablet Take 400 mg by mouth at bedtime.    . metFORMIN (GLUCOPHAGE-XR) 750 MG 24 hr tablet Take 750 mg by mouth daily with breakfast.    . metoprolol tartrate (LOPRESSOR) 25 MG tablet Take 1 tablet (25 mg total) by mouth 2 (two) times daily. 60 tablet 0  . mirtazapine (REMERON) 45 MG tablet Take 45 mg by mouth at bedtime.    Marland Kitchen morphine (MS CONTIN) 30 MG 12 hr tablet Take 1 tablet (30 mg total) by mouth every 12 (twelve) hours. 60 tablet 0  . omeprazole (PRILOSEC) 20 MG capsule Take 20 mg by mouth daily before breakfast.     . ondansetron (ZOFRAN) 8 MG tablet Take 8 mg by mouth every 8 (eight) hours as needed for nausea or vomiting.    Marland Kitchen oxyCODONE-acetaminophen (ROXICET) 5-325 MG tablet Take 1 tablet by mouth every 8 (eight) hours as needed for moderate pain or severe pain. 90 tablet 0  . prochlorperazine (COMPAZINE) 10 MG tablet TAKE 1 TABLET (10MG) BY MOUTH EVERY SIX HOURS AS NEEDED FOR NAUSEA OR VOMITING 30 tablet 1  . promethazine (PHENERGAN) 25 MG suppository Place 1 suppository (25 mg total) rectally every 6 (six) hours as needed for nausea or vomiting. 12 each 0  . simvastatin (ZOCOR) 20 MG tablet Take 20 mg by mouth at bedtime.     . traZODone (DESYREL) 150 MG tablet Take 150 mg by mouth at bedtime.     No current facility-administered medications for this visit.    PHYSICAL EXAMINATION: ECOG PERFORMANCE STATUS: 1 - Symptomatic but completely ambulatory  BP 120/76 mmHg  Pulse 84  Temp(Src) 97 F (36.1 C) (Tympanic)  Resp 18  Wt 159 lb 9.8 oz (72.4 kg)  SpO2   Filed Weights   10/04/15 1041  Weight: 159 lb 9.8 oz (72.4 kg)     GENERAL: Well-nourished well-developed; Alert, no distress and comfortable. She is walking with a cane. Accompanied by family. EYES: no pallor or icterus OROPHARYNX: no thrush or ulceration; good dentition  NECK: supple, no masses felt LYMPH:  no palpable lymphadenopathy in the cervical, axillary or inguinal regions LUNGS: Decreased breath sounds on the left side compared to the right.  No wheeze or crackles HEART/CVS: regular rate & rhythm and no murmurs; right lower extremity swelling/tenderness in the calf. ABDOMEN:abdomen soft, non-tender and normal bowel sounds Musculoskeletal:no  cyanosis of digits and no clubbing; palpable swelling in the right arm-resolved [previously 5 x 5 cm ] PSYCH: alert & oriented x 3 with fluent speech NEURO: no focal motor/sensory deficits SKIN:  no rashes or significant lesions  LABORATORY DATA:  I have reviewed the data as listed    Component Value Date/Time   NA 135 10/04/2015 0830   NA 141 02/15/2013 1152   K 4.4 10/04/2015 0830   K 4.1 02/15/2013 1152   CL 103 10/04/2015 0830   CL 115* 02/15/2013 1152   CO2 26 10/04/2015 0830   CO2 21 02/15/2013 1152   GLUCOSE 105* 10/04/2015 0830   GLUCOSE 85 02/15/2013 1152   BUN 14 10/04/2015 0830   BUN 20* 02/15/2013 1152   CREATININE 0.94 10/04/2015 0830   CREATININE 0.96 02/15/2013 1152   CALCIUM 9.5 10/04/2015 0830   CALCIUM 9.0 02/15/2013 1152   PROT 7.9 10/04/2015 0830   PROT 7.8 07/18/2012 1025   ALBUMIN 3.5 10/04/2015 0830   ALBUMIN 4.0 07/18/2012 1025   AST 19 10/04/2015 0830   AST 20 07/18/2012 1025   ALT 15 10/04/2015 0830   ALT 20 07/18/2012 1025   ALKPHOS 130* 10/04/2015 0830   ALKPHOS 116 07/18/2012 1025   BILITOT 0.3 10/04/2015 0830   BILITOT 0.5 07/18/2012 1025   GFRNONAA >60 10/04/2015 0830   GFRNONAA >60 02/15/2013 1152   GFRAA >60 10/04/2015 0830   GFRAA >60 02/15/2013 1152    No results found for: SPEP, UPEP  Lab Results  Component Value Date   WBC 8.0 10/04/2015    NEUTROABS 4.1 10/04/2015   HGB 11.1* 10/04/2015   HCT 33.4* 10/04/2015   MCV 84.9 10/04/2015   PLT 316 10/04/2015      Chemistry      Component Value Date/Time   NA 135 10/04/2015 0830   NA 141 02/15/2013 1152   K 4.4 10/04/2015 0830   K 4.1 02/15/2013 1152   CL 103 10/04/2015 0830   CL 115* 02/15/2013 1152   CO2 26 10/04/2015 0830   CO2 21 02/15/2013 1152   BUN 14 10/04/2015 0830   BUN 20* 02/15/2013 1152   CREATININE 0.94 10/04/2015 0830   CREATININE 0.96 02/15/2013 1152       Component Value Date/Time   CALCIUM 9.5 10/04/2015 0830   CALCIUM 9.0 02/15/2013 1152   ALKPHOS 130* 10/04/2015 0830   ALKPHOS 116 07/18/2012 1025   AST 19 10/04/2015 0830   AST 20 07/18/2012 1025   ALT 15 10/04/2015 0830   ALT 20 07/18/2012 1025   BILITOT 0.3 10/04/2015 0830   BILITOT 0.5 07/18/2012 1025        ASSESSMENT & PLAN:   Primary cancer of left lung metastatic to other site [facility])     Primary cancer of left lower lobe of lung ([place]) # LEFT LOWER LUNG ADENOCARCINOMA- STAGE IV- on palliative chemotherapy with carboplatin and Alimta; Keytruda. clinically no evidence of progression noted. Response noted especially with improvement of her right shoulder mass. We'll get a CT scan after this cycle.  # Proceed with cycle #4 today. Labs are adequate. Tolerating well without signs of progression noted   # PE- on anti-coagulation with eliquis.  # R UE- swelling- check dopplers.  # PAIN CONTROL-poorly controlled increased MS Contin to 20 mg 3 times a day Continue oxycodone as needed.   # Follow-up with me in 3 weeks/labs/chemotherapy.    Ct scan in 2 weeks.     Cammie Sickle, MD  10/04/2015 7:10 PM

## 2015-10-04 NOTE — Assessment & Plan Note (Signed)
#   LEFT LOWER LUNG ADENOCARCINOMA- STAGE IV- on palliative chemotherapy with carboplatin and Alimta; Keytruda. clinically no evidence of progression noted. Response noted especially with improvement of her right shoulder mass. We'll get a CT scan after this cycle.  # Proceed with cycle #4 today. Labs are adequate. Tolerating well without signs of progression noted   # PE- on anti-coagulation with eliquis.  # R UE- swelling- check dopplers.  # PAIN CONTROL-poorly controlled increased MS Contin to 20 mg 3 times a day Continue oxycodone as needed.   # Follow-up with me in 3 weeks/labs/chemotherapy.

## 2015-10-05 ENCOUNTER — Ambulatory Visit
Admission: RE | Admit: 2015-10-05 | Discharge: 2015-10-05 | Disposition: A | Discharge status: Home / Self Care | Payer: Medicare Other | Source: Ambulatory Visit | Attending: Internal Medicine | Admitting: Internal Medicine

## 2015-10-05 DIAGNOSIS — M7989 Other specified soft tissue disorders: Secondary | ICD-10-CM | POA: Diagnosis present

## 2015-10-05 DIAGNOSIS — M899 Disorder of bone, unspecified: Secondary | ICD-10-CM | POA: Diagnosis not present

## 2015-10-05 DIAGNOSIS — R590 Localized enlarged lymph nodes: Secondary | ICD-10-CM | POA: Insufficient documentation

## 2015-10-05 DIAGNOSIS — C3492 Malignant neoplasm of unspecified part of left bronchus or lung: Secondary | ICD-10-CM | POA: Insufficient documentation

## 2015-10-05 MED ORDER — IOPAMIDOL (ISOVUE-300) INJECTION 61%
75.0000 mL | Freq: Once | INTRAVENOUS | Status: AC | PRN
  Administered 2015-10-05: 75 mL via INTRAVENOUS

## 2015-10-09 ENCOUNTER — Other Ambulatory Visit: Payer: Self-pay | Admitting: Internal Medicine

## 2015-10-09 ENCOUNTER — Inpatient Hospital Stay (HOSPITAL_BASED_OUTPATIENT_CLINIC_OR_DEPARTMENT_OTHER): Payer: Medicare Other | Admitting: Oncology

## 2015-10-09 ENCOUNTER — Encounter: Payer: Self-pay | Admitting: *Deleted

## 2015-10-09 VITALS — BP 154/85 | HR 108 | Temp 97.0°F | Wt 160.3 lb

## 2015-10-09 DIAGNOSIS — E119 Type 2 diabetes mellitus without complications: Secondary | ICD-10-CM

## 2015-10-09 DIAGNOSIS — Z7689 Persons encountering health services in other specified circumstances: Secondary | ICD-10-CM | POA: Diagnosis not present

## 2015-10-09 DIAGNOSIS — J449 Chronic obstructive pulmonary disease, unspecified: Secondary | ICD-10-CM

## 2015-10-09 DIAGNOSIS — R109 Unspecified abdominal pain: Secondary | ICD-10-CM

## 2015-10-09 DIAGNOSIS — J910 Malignant pleural effusion: Secondary | ICD-10-CM

## 2015-10-09 DIAGNOSIS — K449 Diaphragmatic hernia without obstruction or gangrene: Secondary | ICD-10-CM

## 2015-10-09 DIAGNOSIS — G4730 Sleep apnea, unspecified: Secondary | ICD-10-CM

## 2015-10-09 DIAGNOSIS — C3432 Malignant neoplasm of lower lobe, left bronchus or lung: Secondary | ICD-10-CM

## 2015-10-09 DIAGNOSIS — K5900 Constipation, unspecified: Secondary | ICD-10-CM

## 2015-10-09 DIAGNOSIS — I10 Essential (primary) hypertension: Secondary | ICD-10-CM

## 2015-10-09 DIAGNOSIS — G8929 Other chronic pain: Secondary | ICD-10-CM

## 2015-10-09 DIAGNOSIS — Z5111 Encounter for antineoplastic chemotherapy: Secondary | ICD-10-CM | POA: Diagnosis not present

## 2015-10-09 DIAGNOSIS — Z7984 Long term (current) use of oral hypoglycemic drugs: Secondary | ICD-10-CM

## 2015-10-09 DIAGNOSIS — Z79899 Other long term (current) drug therapy: Secondary | ICD-10-CM

## 2015-10-09 DIAGNOSIS — M7989 Other specified soft tissue disorders: Secondary | ICD-10-CM

## 2015-10-09 DIAGNOSIS — F329 Major depressive disorder, single episode, unspecified: Secondary | ICD-10-CM

## 2015-10-09 DIAGNOSIS — Z8669 Personal history of other diseases of the nervous system and sense organs: Secondary | ICD-10-CM

## 2015-10-09 DIAGNOSIS — F419 Anxiety disorder, unspecified: Secondary | ICD-10-CM

## 2015-10-09 DIAGNOSIS — Z86711 Personal history of pulmonary embolism: Secondary | ICD-10-CM

## 2015-10-09 DIAGNOSIS — F17210 Nicotine dependence, cigarettes, uncomplicated: Secondary | ICD-10-CM

## 2015-10-09 DIAGNOSIS — E7800 Pure hypercholesterolemia, unspecified: Secondary | ICD-10-CM

## 2015-10-09 DIAGNOSIS — Z7901 Long term (current) use of anticoagulants: Secondary | ICD-10-CM

## 2015-10-09 NOTE — Progress Notes (Signed)
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;May 4th Carbo-Alimta q3 W; added keytruda with #2   # May 2017-RUL & LL- PE on Eliquis  # LEFT LUNG MALIGNANT EFFUSION- s/p Pleurodesis [Dr.[facility]]   # MOLECULAR STUDIES-  [PDL-1/EGFR-ALK-NEG]; check F-1 [july12th]     Primary cancer of left lower lobe of lung ([place])   07/19/2015 Initial Diagnosis Primary cancer of left lower lobe of lung ([place])     INTERVAL HISTORY:  A pleasant 60 year old female patient with above history of poorly diagnosed adenocarcinoma left lower lobe of the lung with left-sided malignant effusion/stage IV- currently status post cycle #4 of carboplatin-Alimta-keytruda is here as acute add on for pain.  Patient complains of pain today in her upper abdomen, mainly on her left side where a previous chest tube was. She is tearful today and reports being 'scared'. She has a lot of anxiety. Would like her CT results which did not show worsening cancer and given to her per Dr Rogue Bussing. She also has some constipation which she states she takes a suppository that is not working. There seems to be some confusion over her medications. She is not sure what she takes for pain and not sure if she takes 20 mg two times daily or three times daily. Also the suppository she is taking for constipation is suspected to be phenergan. Her daughter is going to write down her meds and what she is taking and what she is taking if for and bring report to Dr B at the next visit. Her pain and constipation may be due to taking medications incorrectly.  REVIEW OF SYSTEMS:  A complete 10 point review of system is done which is negative except mentioned above/history of present illness.   PAST MEDICAL HISTORY :  Past Medical History   Diagnosis Date  . Diabetes mellitus without complication ([place])   . Sleep apnea   . Hypertension   . Aneurysm ([place])   . Hiatal hernia   . COPD (chronic obstructive pulmonary disease) ([place])     on home oxygen  . Illicit drug use   . Migraines   . Hypercholesteremia   . Depression   . Spine disorder   . Family history of chronic pain 01/17/2015  . Lung cancer ([place])     adenocarcinoma left lung  . Chest pain, pleuritic     PAST SURGICAL HISTORY :   Past Surgical History  Procedure Laterality Date  . Abdominal hysterectomy    . Hernia repair    . Eye surgery    . Video assisted thoracoscopy (vats)/thorocotomy Left 07/14/2015    Procedure: VIDEO ASSISTED THORACOSCOPY (VATS), pleural biopsy;  Surgeon: Nestor Lewandowsky, MD;  Location: [facility] ORS;  Service: General;  Laterality: Left;  . Portacath placement Left 07/14/2015    Procedure: INSERTION PORT-A-CATH;  Surgeon: Nestor Lewandowsky, MD;  Location: [facility] ORS;  Service: General;  Laterality: Left;  . Removal of pleural drainage catheter N/A 07/18/2015    Procedure: REMOVAL OF PLEURAL DRAINAGE CATHETER;  Surgeon: Nestor Lewandowsky, MD;  Location: [facility] ORS;  Service: Thoracic;  Laterality: N/A;    FAMILY HISTORY :   Family History  Problem Relation Age of Onset  . Heart disease Mother   . Breast cancer Sister     SOCIAL HISTORY:  Social History  Substance Use Topics  . Smoking status: Former Smoker -- 1.50 packs/day for 45 years    Types: Cigarettes    Quit date: 04/29/2015  . Smokeless tobacco: Not on file     Comment: Quit 2 months ago  . Alcohol Use: No    ALLERGIES:  is allergic to ampicillin and penicillins.  MEDICATIONS:  Current Outpatient Prescriptions  Medication Sig Dispense Refill  . albuterol (PROVENTIL HFA;VENTOLIN HFA) 108 (90 Base) MCG/ACT inhaler Inhale 2 puffs into the lungs every 6 (six) hours as needed for wheezing or shortness of breath. 1 Inhaler 2  . alendronate (FOSAMAX) 70 MG tablet Take 70 mg by mouth once a  week. Pt takes on Tuesday.   Take with a full glass of water on an empty stomach.    Marland Kitchen apixaban (ELIQUIS) 5 MG TABS tablet Take 1 tablet (5 mg total) by mouth 2 (two) times daily. 60 tablet 1  . cetirizine (ZYRTEC) 10 MG tablet Take 10 mg by mouth at bedtime.    . DULoxetine (CYMBALTA) 30 MG capsule Take 90 mg by mouth daily.     . fluticasone (FLOVENT HFA) 220 MCG/ACT inhaler Inhale 1 puff into the lungs 2 (two) times daily.    . folic acid (FOLVITE) 1 MG tablet Take 1 mg by mouth daily.    Marland Kitchen gabapentin (NEURONTIN) 800 MG tablet Take 800 mg by mouth 3 (three) times daily.    Marland Kitchen levofloxacin (LEVAQUIN) 500 MG tablet Take 1 tablet (500 mg total) by mouth daily. 5 tablet 0  . lidocaine-prilocaine (EMLA) cream Apply 1 application topically as needed (prior to accessing port).    . magnesium oxide (MAG-OX) 400 (241.3 Mg) MG tablet Take 400 mg by mouth at bedtime.    . metFORMIN (GLUCOPHAGE-XR) 750 MG 24 hr tablet Take 750 mg by mouth daily with breakfast.    . metoprolol tartrate (LOPRESSOR) 25 MG tablet Take 1 tablet (25 mg total) by mouth 2 (two) times daily. 60 tablet 0  . mirtazapine (REMERON) 45 MG tablet Take 45 mg by mouth at bedtime.    Marland Kitchen morphine (MS CONTIN) 30 MG 12 hr tablet Take 1 tablet (30 mg total) by mouth every 12 (twelve) hours. 60 tablet 0  . omeprazole (PRILOSEC) 20 MG capsule Take 20 mg by mouth daily before breakfast.     . ondansetron (ZOFRAN) 8 MG tablet Take 8 mg by mouth every 8 (eight) hours as needed for nausea or vomiting.    Marland Kitchen oxyCODONE-acetaminophen (ROXICET) 5-325 MG tablet Take 1 tablet by mouth every 8 (eight) hours as needed for moderate pain or severe pain. 90 tablet 0  . prochlorperazine (COMPAZINE) 10 MG tablet TAKE 1 TABLET ('10MG'$ ) BY MOUTH EVERY SIX HOURS AS NEEDED FOR NAUSEA OR VOMITING 30 tablet 1  . promethazine (PHENERGAN) 25 MG suppository Place 1 suppository (25 mg total) rectally every 6 (six) hours as needed for nausea or vomiting. 12 each 0  .  simvastatin (ZOCOR) 20 MG tablet Take 20 mg by mouth at bedtime.     . traZODone (DESYREL) 150 MG tablet Take 150 mg by mouth at bedtime.     No current facility-administered medications for this visit.    PHYSICAL EXAMINATION: ECOG PERFORMANCE STATUS: 1 - Symptomatic but completely ambulatory  BP 154/85 mmHg  Pulse 108  Temp(Src) 97 F (36.1 C)  Wt 160 lb 4.4 oz (72.7 kg)  SpO2 94%  Filed Weights   10/09/15 1010  Weight: 160 lb 4.4  oz (72.7 kg)    GENERAL: Well-nourished well-developed; Alert, no distress and comfortable. Accompanied by family. EYES: no pallor or icterus OROPHARYNX: no thrush or ulceration; good dentition  NECK: supple, no masses felt LYMPH:  no palpable lymphadenopathy in the cervical, axillary or inguinal regions LUNGS: Decreased breath sounds on the left side compared to the right.  No wheeze or crackles HEART/CVS: regular rate & rhythm and no murmurs ABDOMEN:abdomen soft, non-tender and normal bowel sounds Musculoskeletal:no cyanosis of digits and no clubbing PSYCH: alert & oriented x 3 with fluent speech. Tearful NEURO: no focal motor/sensory deficits SKIN:  no rashes or significant lesions  LABORATORY DATA:  I have reviewed the data as listed    Component Value Date/Time   NA 135 10/04/2015 0830   NA 141 02/15/2013 1152   K 4.4 10/04/2015 0830   K 4.1 02/15/2013 1152   CL 103 10/04/2015 0830   CL 115* 02/15/2013 1152   CO2 26 10/04/2015 0830   CO2 21 02/15/2013 1152   GLUCOSE 105* 10/04/2015 0830   GLUCOSE 85 02/15/2013 1152   BUN 14 10/04/2015 0830   BUN 20* 02/15/2013 1152   CREATININE 0.94 10/04/2015 0830   CREATININE 0.96 02/15/2013 1152   CALCIUM 9.5 10/04/2015 0830   CALCIUM 9.0 02/15/2013 1152   PROT 7.9 10/04/2015 0830   PROT 7.8 07/18/2012 1025   ALBUMIN 3.5 10/04/2015 0830   ALBUMIN 4.0 07/18/2012 1025   AST 19 10/04/2015 0830   AST 20 07/18/2012 1025   ALT 15 10/04/2015 0830   ALT 20 07/18/2012 1025   ALKPHOS 130*  10/04/2015 0830   ALKPHOS 116 07/18/2012 1025   BILITOT 0.3 10/04/2015 0830   BILITOT 0.5 07/18/2012 1025   GFRNONAA >60 10/04/2015 0830   GFRNONAA >60 02/15/2013 1152   GFRAA >60 10/04/2015 0830   GFRAA >60 02/15/2013 1152    No results found for: SPEP, UPEP  Lab Results  Component Value Date   WBC 8.0 10/04/2015   NEUTROABS 4.1 10/04/2015   HGB 11.1* 10/04/2015   HCT 33.4* 10/04/2015   MCV 84.9 10/04/2015   PLT 316 10/04/2015      Chemistry      Component Value Date/Time   NA 135 10/04/2015 0830   NA 141 02/15/2013 1152   K 4.4 10/04/2015 0830   K 4.1 02/15/2013 1152   CL 103 10/04/2015 0830   CL 115* 02/15/2013 1152   CO2 26 10/04/2015 0830   CO2 21 02/15/2013 1152   BUN 14 10/04/2015 0830   BUN 20* 02/15/2013 1152   CREATININE 0.94 10/04/2015 0830   CREATININE 0.96 02/15/2013 1152       Component Value Date/Time   CALCIUM 9.5 10/04/2015 0830   CALCIUM 9.0 02/15/2013 1152   ALKPHOS 130* 10/04/2015 0830   ALKPHOS 116 07/18/2012 1025   AST 19 10/04/2015 0830   AST 20 07/18/2012 1025   ALT 15 10/04/2015 0830   ALT 20 07/18/2012 1025   BILITOT 0.3 10/04/2015 0830   BILITOT 0.5 07/18/2012 1025        ASSESSMENT & PLAN:   1. LEFT LOWER LUNG ADENOCARCINOMA- STAGE IV- on palliative chemotherapy with carboplatin and Alimta; Keytruda. clinically no evidence of progression noted. Ct scan reviewed by Dr B and reported to patient per his instruction.  2. PAIN CONTROL-poorly controlled increased MS Contin to 20 mg 3 times a day Continue oxycodone as needed. Restated instructions to patient and her daughter to follow directions on pain medication.   3.  Constipation- Miralax as needed.   4. Patients daughter is going to make a log of medications to bring to next appointment to verify medications are being used correctly. Keep next appointment already scheduled with Dr B on August 2nd.  Dr. Rogue Bussing was available for consultation and review of plan of care for this  patient.    Mayra Reel, NP 10/09/2015 11:01 AM

## 2015-10-09 NOTE — Progress Notes (Signed)
Pain in back moving around in front to her ribs is rated a 10/10. Stooping over with the pain. Constipation is a problem. She is using "prescription suppositories" and they are not working. Patient has Phenergan supp for nausea, I wonder if that is what she is taking for constipation. Appetite is poor. Has a lot of nausea. Patient is tearful." She is scared of her cancer. Her insides are shaking"

## 2015-10-09 NOTE — Progress Notes (Signed)
Pt called answering service this morning c/o of "muscle spasms." msg rcvd via from answering service at 945am.  Pt showed up in clinic at 940am-requesting to be evaluated. I personally spoke to patient to clarify her symptoms. She states that she has had intense muscle spasms at her previous chest tube site on right lateral chest wall. She is very tearful. Rates pain 10/10. Currently on roxicet, which is not helping her pain. She taking narcotic as directed.  RN spoke with Dr. Consuello Masse would like pt to see NP.

## 2015-10-15 ENCOUNTER — Emergency Department: Payer: Medicare Other

## 2015-10-15 ENCOUNTER — Emergency Department
Admission: EM | Admit: 2015-10-15 | Discharge: 2015-10-15 | Disposition: A | Discharge status: Home / Self Care | Payer: Medicare Other | Attending: Emergency Medicine | Admitting: Emergency Medicine

## 2015-10-15 ENCOUNTER — Encounter: Payer: Self-pay | Admitting: Emergency Medicine

## 2015-10-15 DIAGNOSIS — E119 Type 2 diabetes mellitus without complications: Secondary | ICD-10-CM | POA: Diagnosis not present

## 2015-10-15 DIAGNOSIS — Z7951 Long term (current) use of inhaled steroids: Secondary | ICD-10-CM | POA: Diagnosis not present

## 2015-10-15 DIAGNOSIS — Z87891 Personal history of nicotine dependence: Secondary | ICD-10-CM | POA: Diagnosis not present

## 2015-10-15 DIAGNOSIS — J449 Chronic obstructive pulmonary disease, unspecified: Secondary | ICD-10-CM | POA: Insufficient documentation

## 2015-10-15 DIAGNOSIS — M545 Low back pain, unspecified: Secondary | ICD-10-CM

## 2015-10-15 DIAGNOSIS — Z792 Long term (current) use of antibiotics: Secondary | ICD-10-CM | POA: Diagnosis not present

## 2015-10-15 DIAGNOSIS — Z79899 Other long term (current) drug therapy: Secondary | ICD-10-CM | POA: Diagnosis not present

## 2015-10-15 DIAGNOSIS — Z7984 Long term (current) use of oral hypoglycemic drugs: Secondary | ICD-10-CM | POA: Diagnosis not present

## 2015-10-15 DIAGNOSIS — C3432 Malignant neoplasm of lower lobe, left bronchus or lung: Secondary | ICD-10-CM

## 2015-10-15 DIAGNOSIS — M549 Dorsalgia, unspecified: Secondary | ICD-10-CM

## 2015-10-15 DIAGNOSIS — I10 Essential (primary) hypertension: Secondary | ICD-10-CM | POA: Diagnosis not present

## 2015-10-15 DIAGNOSIS — Z79891 Long term (current) use of opiate analgesic: Secondary | ICD-10-CM | POA: Diagnosis not present

## 2015-10-15 DIAGNOSIS — Z85118 Personal history of other malignant neoplasm of bronchus and lung: Secondary | ICD-10-CM | POA: Insufficient documentation

## 2015-10-15 LAB — BASIC METABOLIC PANEL
ANION GAP: 6 (ref 5–15)
BUN: 9 mg/dL (ref 6–20)
CALCIUM: 9.5 mg/dL (ref 8.9–10.3)
CO2: 26 mmol/L (ref 22–32)
Chloride: 105 mmol/L (ref 101–111)
Creatinine, Ser: 0.67 mg/dL (ref 0.44–1.00)
GLUCOSE: 84 mg/dL (ref 65–99)
Potassium: 3.9 mmol/L (ref 3.5–5.1)
Sodium: 137 mmol/L (ref 135–145)

## 2015-10-15 LAB — CBC WITH DIFFERENTIAL/PLATELET
Basophils Absolute: 0.1 10*3/uL (ref 0–0.1)
Basophils Relative: 1 %
Eosinophils Absolute: 0.2 10*3/uL (ref 0–0.7)
Eosinophils Relative: 1 %
HEMATOCRIT: 32.9 % — AB (ref 35.0–47.0)
HEMOGLOBIN: 10.9 g/dL — AB (ref 12.0–16.0)
LYMPHS ABS: 3.4 10*3/uL (ref 1.0–3.6)
LYMPHS PCT: 12 %
MCH: 28.6 pg (ref 26.0–34.0)
MCHC: 33.3 g/dL (ref 32.0–36.0)
MCV: 86.1 fL (ref 80.0–100.0)
Monocytes Absolute: 2 10*3/uL — ABNORMAL HIGH (ref 0.2–0.9)
Monocytes Relative: 7 %
NEUTROS PCT: 79 %
Neutro Abs: 22 10*3/uL — ABNORMAL HIGH (ref 1.4–6.5)
Platelets: 213 10*3/uL (ref 150–440)
RBC: 3.82 MIL/uL (ref 3.80–5.20)
RDW: 22.3 % — ABNORMAL HIGH (ref 11.5–14.5)
WBC: 27.7 10*3/uL — AB (ref 3.6–11.0)

## 2015-10-15 LAB — URINALYSIS COMPLETE WITH MICROSCOPIC (ARMC ONLY)
BACTERIA UA: NONE SEEN
Bilirubin Urine: NEGATIVE
Glucose, UA: NEGATIVE mg/dL
Hgb urine dipstick: NEGATIVE
KETONES UR: NEGATIVE mg/dL
Leukocytes, UA: NEGATIVE
Nitrite: NEGATIVE
PH: 6 (ref 5.0–8.0)
PROTEIN: NEGATIVE mg/dL
Specific Gravity, Urine: 1.014 (ref 1.005–1.030)

## 2015-10-15 MED ORDER — MORPHINE SULFATE (PF) 4 MG/ML IV SOLN
4.0000 mg | Freq: Once | INTRAVENOUS | Status: AC
  Administered 2015-10-15: 4 mg via INTRAVENOUS
  Filled 2015-10-15: qty 1

## 2015-10-15 MED ORDER — HYDROMORPHONE HCL 1 MG/ML IJ SOLN
1.0000 mg | Freq: Once | INTRAMUSCULAR | Status: AC
  Administered 2015-10-15: 1 mg via INTRAVENOUS
  Filled 2015-10-15: qty 1

## 2015-10-15 MED ORDER — OXYCODONE-ACETAMINOPHEN 5-325 MG PO TABS: 1.0000 | ORAL_TABLET | Freq: Three times a day (TID) | ORAL | 0 refills | Status: DC | PRN

## 2015-10-15 MED ORDER — SODIUM CHLORIDE 0.9 % IV BOLUS (SEPSIS)
500.0000 mL | Freq: Once | INTRAVENOUS | Status: AC
  Administered 2015-10-15: 500 mL via INTRAVENOUS

## 2015-10-15 NOTE — ED Notes (Signed)
Pt also reports rash to mid back area

## 2015-10-15 NOTE — ED Triage Notes (Signed)
Pt presents with low back pain and spasms started two days ago. Pt currently being treated for lung cancer and recving chemo. Last treated was last week.

## 2015-10-15 NOTE — ED Notes (Signed)
Patient states that she has been having lower back pain and spasms for the past 2 days. Patient reports that she has chronic back pain and was a patient at the pain clinic but she stopped going once she was diagnosed with cancer about 3 months ago.  Patient states that she is taking morphine and percocet for the pain but it is not helping.

## 2015-10-15 NOTE — ED Provider Notes (Addendum)
[facility] Emergency Department Provider Note  ____________________________________________   I have reviewed the triage vital signs and the nursing notes.   HISTORY  Chief Complaint Back Pain    HPI Charlene Williams is a 60 y.o. female with a history of chronic back pain for which she goes to a pain doctor or at least use to until she became afflicted with cancer. She also has stage IV lung cancer. She is getting palliative care. Patient states that she ran out of her pain medication since having a recurrence of her chronic back pain. This is her lower back pain. It is exactly the same as the pain she has been having since 2012. She denies any fever or chills or nausea or vomiting denies any numbness or weakness she denies any shortness of breath over her baseline and she is on 1-2 L of oxygen at home. She denies any cough. She denies any incontinence of bowel or bladder or trauma. She states that she is simply needing pain medication she has no dysuria or urinary frequency.      Past Medical History:  Diagnosis Date  . Aneurysm ([place])   . Chest pain, pleuritic   . COPD (chronic obstructive pulmonary disease) ([place])    on home oxygen  . Depression   . Diabetes mellitus without complication ([place])   . Family history of chronic pain 01/17/2015  . Hiatal hernia   . Hypercholesteremia   . Hypertension   . Illicit drug use   . Lung cancer ([place])    adenocarcinoma left lung  . Migraines   . Sleep apnea   . Spine disorder     Patient Active Problem List   Diagnosis Date Noted  . Left upper extremity swelling 10/04/2015  . Elevated serum creatinine 09/13/2015  . Sepsis ([place]) 08/09/2015  . HCAP (healthcare-associated pneumonia) 08/09/2015  . Pulmonary embolism ([place]) 07/29/2015  . Primary cancer of left lower lobe of lung ([place]) 07/19/2015  . Pleural effusion   . Recurrent left pleural effusion   . COPD (chronic obstructive pulmonary disease) ([place])  07/11/2015  . SIRS (systemic inflammatory response syndrome) ([place]) 06/25/2015  . Pleural effusion, left 06/25/2015  . Left lower lobe pneumonia 06/25/2015  . Hypoxemia 06/25/2015  . Hyponatremia 06/25/2015  . Elevated troponin 06/25/2015  . Generalized weakness 06/25/2015  . Chronic lower extremity pain (Bilateral) 05/19/2015  . Substance use disorder (SUB Risk: Very high) 05/19/2015  . Myofascial pain 05/15/2015  . Muscle spasms of lower extremity 05/15/2015  . Chronic sacroiliac joint pain (Location of Primary Source of Pain) (Bilateral) (R>L) 05/15/2015  . Neurogenic pain 05/15/2015  . Avitaminosis D 05/15/2015  . Chronic pain syndrome 01/17/2015  . Chronic pain 01/17/2015  . Lumbar facet syndrome (Location of Primary Source of Pain) (Bilateral) (R>L) 01/17/2015  . Chronic low back pain (Location of Primary Source of Pain) (Bilateral) (R>L) 01/17/2015  . Chronic radicular lumbar pain (Bilateral) 01/17/2015  . Osteoarthritis of spine with radiculopathy, lumbar region 01/17/2015  . Polysubstance abuse 01/17/2015  . Lumbar spondylosis 01/17/2015  . Lumbar facet arthropathy (Bilateral) 01/17/2015  . Magnesium deficiency 01/17/2015  . Lumbar spinal stenosis (Central Spinal Stenosis) ([place]) (L3-4 and L4-5) 01/17/2015  . Family history of chronic pain 01/17/2015  . Nicotine dependence 01/17/2015  . Smoker 01/17/2015  . Tobacco abuse 01/17/2015  . Essential hypertension, benign 01/17/2015  . Chronic obstructive pulmonary disease (COPD) ([place]) 01/17/2015  . Emphysema of lung ([place]) 01/17/2015  . History of bronchitis 01/17/2015  .  History of shortness of breath 01/17/2015  . Generalized anxiety disorder 01/17/2015  . Seizure disorder ([place]) 01/17/2015  . Depression 01/17/2015  . History of panic attacks 01/17/2015  . Non-insulin dependent type 2 diabetes mellitus ([place]) 01/17/2015    Past Surgical History:  Procedure Laterality Date  . ABDOMINAL HYSTERECTOMY    . EYE SURGERY     . HERNIA REPAIR    . PORTACATH PLACEMENT Left 07/14/2015   Procedure: INSERTION PORT-A-CATH;  Surgeon: Nestor Lewandowsky, MD;  Location: [facility] ORS;  Service: General;  Laterality: Left;  . REMOVAL OF PLEURAL DRAINAGE CATHETER N/A 07/18/2015   Procedure: REMOVAL OF PLEURAL DRAINAGE CATHETER;  Surgeon: Nestor Lewandowsky, MD;  Location: [facility] ORS;  Service: Thoracic;  Laterality: N/A;  . VIDEO ASSISTED THORACOSCOPY (VATS)/THOROCOTOMY Left 07/14/2015   Procedure: VIDEO ASSISTED THORACOSCOPY (VATS), pleural biopsy;  Surgeon: Nestor Lewandowsky, MD;  Location: [facility];  Service: General;  Laterality: Left;    Current Outpatient Rx  . Order #: [id number] Class: Normal  . Order #: [id number] Class: Historical Med  . Order #: [id number] Class: Print  . Order #: [id number] Class: Historical Med  . Order #: [id number] Class: Historical Med  . Order #: [id number] Class: Historical Med  . Order #: [id number] Class: Historical Med  . Order #: [id number] Class: Historical Med  . Order #: [id number] Class: Normal  . Order #: [id number] Class: Historical Med  . Order #: [id number] Class: Historical Med  . Order #: [id number] Class: Historical Med  . Order #: [id number] Class: Normal  . Order #: [id number] Class: Historical Med  . Order #: [id number] Class: Print  . Order #: [id number] Class: Historical Med  . Order #: [id number] Class: Historical Med  . Order #: [id number] Class: Print  . Order #: [id number] Class: Normal  . Order #: [id number] Class: Print  . Order #: [id number] Class: Historical Med  . Order #: [id number] Class: Historical Med    Allergies Ampicillin and Penicillins  Family History  Problem Relation Age of Onset  . Heart disease Mother   . Breast cancer Sister     Social History Social History  Substance Use Topics  . Smoking status: Former Smoker    Packs/day: 1.50    Years: 45.00    Types: Cigarettes    Quit date: 04/29/2015  . Smokeless tobacco: Never Used     Comment: Quit 2 months ago  . Alcohol use No    Review of  Systems onstitutional: No fever/chills Eyes: No visual changes. ENT: No sore throat. No stiff neck no neck pain Cardiovascular: Denies chest pain. Respiratory: Denies shortness of breath. Gastrointestinal:   no vomiting.  No diarrhea.  No constipation. Genitourinary: Negative for dysuria. Musculoskeletal: Negative lower extremity swelling Skin: Negative for rash. Neurological: Negative for severe headaches, focal weakness or numbness. 10-point ROS otherwise negative.  ____________________________________________   PHYSICAL EXAM:  VITAL SIGNS: ED Triage Vitals  Enc Vitals Group     BP 10/15/15 0945 (!) 178/100     Pulse Rate 10/15/15 0945 (!) 107     Resp 10/15/15 0945 20     Temp 10/15/15 0945 99.1 F (37.3 C)     Temp Source 10/15/15 0945 Oral     SpO2 10/15/15 0945 95 %     Weight 10/15/15 0945 160 lb (72.6 kg)     Height 10/15/15 0945 '5\' 2"'$  (1.575 m)     Head Circumference --      Peak Flow --      Pain Score 10/15/15 0955 10     Pain Loc --      Pain Edu? --  Excl. in [place]? --     Constitutional: Alert and oriented. Well appearing and in no acute distress. Eyes: Conjunctivae are normal. PERRL. EOMI. Head: Atraumatic. Nose: No congestion/rhinnorhea. Mouth/Throat: Mucous membranes are moist.  Oropharynx non-erythematous. Neck: No stridor.   Nontender with no meningismus Cardiovascular: Normal rate, regular rhythm. Grossly normal heart sounds.  Good peripheral circulation. Respiratory: Normal respiratory effort.  No retractions. Lungs CTAB. Abdominal: Soft and nontender. No distention. No guarding no rebound Back:  Patient has bilateral paraspinal tenderness in the lower back which reproduces her discomfort there is no midline tenderness there are no lesions noted. there is no CVA tenderness Musculoskeletal: No lower extremity tenderness, no upper extremity tenderness. No joint effusions, no DVT signs strong distal pulses no edema Neurologic:  Normal speech and  language. No gross focal neurologic deficits are appreciated.  Skin:  Skin is warm, dry and intact. No rash noted. Psychiatric: Mood and affect are normal. Speech and behavior are normal.  ____________________________________________   LABS (all labs ordered are listed, but only abnormal results are displayed)  Labs Reviewed  CBC WITH DIFFERENTIAL/PLATELET - Abnormal; Notable for the following:       Result Value   WBC 27.7 (*)    Hemoglobin 10.9 (*)    HCT 32.9 (*)    RDW 22.3 (*)    Neutro Abs 22.0 (*)    Monocytes Absolute 2.0 (*)    All other components within normal limits  URINALYSIS COMPLETEWITH MICROSCOPIC ([facility] ONLY) - Abnormal; Notable for the following:    Color, Urine YELLOW (*)    APPearance CLEAR (*)    Squamous Epithelial / LPF 0-5 (*)    All other components within normal limits  URINE CULTURE  BASIC METABOLIC PANEL   ____________________________________________  EKG  I personally interpreted any EKGs ordered by me or triage  ____________________________________________  RADIOLOGY  I reviewed any imaging ordered by me or triage that were performed during my shift and, if possible, patient and/or family made aware of any abnormal findings. ____________________________________________   PROCEDURES  Procedure(s) performed: None  Procedures  Critical Care performed: None  ____________________________________________   INITIAL IMPRESSION / ASSESSMENT AND PLAN / ED COURSE  Pertinent labs & imaging results that were available during my care of the patient were reviewed by me and considered in my medical decision making (see chart for details).  Patient with low back pain which is chronic requesting narcotic pain medication which she normally takes at home for this. Tears, complicated by history of cancer however the back pain Loc predates the cancer. X-ray shows no evidence of metastatic disease. She is neurologically intact, she has no evidence of  UTI, she does have a white count of 27,000. I did talk to Dr. Joaquin Courts about this, she is able to verify that the patient just had Neulasta and this is a normal white count for someone in that condition. There is no evidence of fever or illness. Patient is also on blood thinners but there is nothing to suggest cauda equina syndrome or spinal cord hematoma or abscess. Patient declines further workup. She just would like pain medication and would like to go home.    ----------------------------------------- 2:46 PM on 10/15/2015 -----------------------------------------  Patient is requesting Percocet for breakthrough pain which is what she takes. I have advised her that I can only give her a small prescription given her extensive narcotics that she is already on. She is very comfortable with this. She is requesting discharge. She does not want  to stay in the hospital she does not wish further care from [place]. We did discuss her white count. Oncology feels this is from her Neulasta and they do not feel any further evaluation is indicated at this time. Patient remains neurologically intact with no complaints aside from her chronic back pain which is now much better controlled. Extensive return precautions given and understood. His acknowledged that this patient is at risk for more significant pathology and we have explained this to her and she understands that she feels worse or changes her mind about further evaluation or admission for this pain, she is to come back. However, patient does have this pain on every day for the last multiple years, and she is on extensive medications and the only reason she is here is because she ran out of her Percocet which she takes for her breakthrough pain. There are no other significant warning signs of any intra-spinal pathology at this time as noted above.  Clinical Course   ____________________________________________   FINAL CLINICAL IMPRESSION(S) / ED  DIAGNOSES  Final diagnoses:  Back pain      This chart was dictated using voice recognition software.  Despite best efforts to proofread,  errors can occur which can change meaning.      Schuyler Amor, MD 10/15/15 1354    Schuyler Amor, MD 10/15/15 [phone]

## 2015-10-16 LAB — URINE CULTURE

## 2015-10-24 ENCOUNTER — Observation Stay: Payer: Medicare Other

## 2015-10-24 ENCOUNTER — Encounter: Payer: Self-pay | Admitting: Emergency Medicine

## 2015-10-24 ENCOUNTER — Observation Stay
Admission: EM | Admit: 2015-10-24 | Discharge: 2015-10-25 | Disposition: A | Discharge status: Home / Self Care | Payer: Medicare Other | Attending: Specialist | Admitting: Specialist

## 2015-10-24 ENCOUNTER — Emergency Department: Payer: Medicare Other

## 2015-10-24 DIAGNOSIS — Z86711 Personal history of pulmonary embolism: Secondary | ICD-10-CM | POA: Insufficient documentation

## 2015-10-24 DIAGNOSIS — F411 Generalized anxiety disorder: Secondary | ICD-10-CM | POA: Insufficient documentation

## 2015-10-24 DIAGNOSIS — E785 Hyperlipidemia, unspecified: Secondary | ICD-10-CM | POA: Insufficient documentation

## 2015-10-24 DIAGNOSIS — I700 Atherosclerosis of aorta: Secondary | ICD-10-CM | POA: Insufficient documentation

## 2015-10-24 DIAGNOSIS — I248 Other forms of acute ischemic heart disease: Secondary | ICD-10-CM | POA: Insufficient documentation

## 2015-10-24 DIAGNOSIS — M4806 Spinal stenosis, lumbar region: Secondary | ICD-10-CM | POA: Insufficient documentation

## 2015-10-24 DIAGNOSIS — M5416 Radiculopathy, lumbar region: Secondary | ICD-10-CM | POA: Insufficient documentation

## 2015-10-24 DIAGNOSIS — J9811 Atelectasis: Secondary | ICD-10-CM | POA: Insufficient documentation

## 2015-10-24 DIAGNOSIS — G4730 Sleep apnea, unspecified: Secondary | ICD-10-CM | POA: Insufficient documentation

## 2015-10-24 DIAGNOSIS — C7951 Secondary malignant neoplasm of bone: Secondary | ICD-10-CM | POA: Insufficient documentation

## 2015-10-24 DIAGNOSIS — Z803 Family history of malignant neoplasm of breast: Secondary | ICD-10-CM | POA: Insufficient documentation

## 2015-10-24 DIAGNOSIS — I729 Aneurysm of unspecified site: Secondary | ICD-10-CM | POA: Insufficient documentation

## 2015-10-24 DIAGNOSIS — F410 Panic disorder [episodic paroxysmal anxiety] without agoraphobia: Secondary | ICD-10-CM | POA: Diagnosis not present

## 2015-10-24 DIAGNOSIS — C3432 Malignant neoplasm of lower lobe, left bronchus or lung: Secondary | ICD-10-CM | POA: Diagnosis not present

## 2015-10-24 DIAGNOSIS — K449 Diaphragmatic hernia without obstruction or gangrene: Secondary | ICD-10-CM | POA: Diagnosis not present

## 2015-10-24 DIAGNOSIS — Z794 Long term (current) use of insulin: Secondary | ICD-10-CM | POA: Insufficient documentation

## 2015-10-24 DIAGNOSIS — E119 Type 2 diabetes mellitus without complications: Secondary | ICD-10-CM | POA: Insufficient documentation

## 2015-10-24 DIAGNOSIS — M533 Sacrococcygeal disorders, not elsewhere classified: Secondary | ICD-10-CM | POA: Insufficient documentation

## 2015-10-24 DIAGNOSIS — R0789 Other chest pain: Secondary | ICD-10-CM | POA: Diagnosis not present

## 2015-10-24 DIAGNOSIS — C3490 Malignant neoplasm of unspecified part of unspecified bronchus or lung: Secondary | ICD-10-CM

## 2015-10-24 DIAGNOSIS — Z79899 Other long term (current) drug therapy: Secondary | ICD-10-CM | POA: Insufficient documentation

## 2015-10-24 DIAGNOSIS — Z880 Allergy status to penicillin: Secondary | ICD-10-CM | POA: Insufficient documentation

## 2015-10-24 DIAGNOSIS — Z7901 Long term (current) use of anticoagulants: Secondary | ICD-10-CM | POA: Insufficient documentation

## 2015-10-24 DIAGNOSIS — R079 Chest pain, unspecified: Secondary | ICD-10-CM | POA: Diagnosis present

## 2015-10-24 DIAGNOSIS — E7800 Pure hypercholesterolemia, unspecified: Secondary | ICD-10-CM | POA: Diagnosis not present

## 2015-10-24 DIAGNOSIS — Z9981 Dependence on supplemental oxygen: Secondary | ICD-10-CM | POA: Insufficient documentation

## 2015-10-24 DIAGNOSIS — J449 Chronic obstructive pulmonary disease, unspecified: Secondary | ICD-10-CM | POA: Diagnosis not present

## 2015-10-24 DIAGNOSIS — Z8249 Family history of ischemic heart disease and other diseases of the circulatory system: Secondary | ICD-10-CM | POA: Insufficient documentation

## 2015-10-24 DIAGNOSIS — J90 Pleural effusion, not elsewhere classified: Secondary | ICD-10-CM | POA: Diagnosis not present

## 2015-10-24 DIAGNOSIS — Z90710 Acquired absence of both cervix and uterus: Secondary | ICD-10-CM | POA: Insufficient documentation

## 2015-10-24 DIAGNOSIS — G43909 Migraine, unspecified, not intractable, without status migrainosus: Secondary | ICD-10-CM | POA: Insufficient documentation

## 2015-10-24 DIAGNOSIS — R0602 Shortness of breath: Secondary | ICD-10-CM

## 2015-10-24 DIAGNOSIS — I10 Essential (primary) hypertension: Secondary | ICD-10-CM | POA: Insufficient documentation

## 2015-10-24 DIAGNOSIS — G894 Chronic pain syndrome: Secondary | ICD-10-CM | POA: Insufficient documentation

## 2015-10-24 DIAGNOSIS — M4696 Unspecified inflammatory spondylopathy, lumbar region: Secondary | ICD-10-CM | POA: Insufficient documentation

## 2015-10-24 DIAGNOSIS — R7989 Other specified abnormal findings of blood chemistry: Secondary | ICD-10-CM | POA: Diagnosis present

## 2015-10-24 DIAGNOSIS — R778 Other specified abnormalities of plasma proteins: Secondary | ICD-10-CM | POA: Insufficient documentation

## 2015-10-24 DIAGNOSIS — G40909 Epilepsy, unspecified, not intractable, without status epilepticus: Secondary | ICD-10-CM | POA: Insufficient documentation

## 2015-10-24 DIAGNOSIS — E871 Hypo-osmolality and hyponatremia: Secondary | ICD-10-CM | POA: Diagnosis not present

## 2015-10-24 DIAGNOSIS — Z87891 Personal history of nicotine dependence: Secondary | ICD-10-CM | POA: Diagnosis not present

## 2015-10-24 DIAGNOSIS — Z9851 Tubal ligation status: Secondary | ICD-10-CM | POA: Insufficient documentation

## 2015-10-24 DIAGNOSIS — E559 Vitamin D deficiency, unspecified: Secondary | ICD-10-CM | POA: Diagnosis not present

## 2015-10-24 DIAGNOSIS — F329 Major depressive disorder, single episode, unspecified: Secondary | ICD-10-CM | POA: Insufficient documentation

## 2015-10-24 DIAGNOSIS — I491 Atrial premature depolarization: Secondary | ICD-10-CM | POA: Insufficient documentation

## 2015-10-24 DIAGNOSIS — R0902 Hypoxemia: Secondary | ICD-10-CM | POA: Insufficient documentation

## 2015-10-24 DIAGNOSIS — Z7982 Long term (current) use of aspirin: Secondary | ICD-10-CM | POA: Insufficient documentation

## 2015-10-24 DIAGNOSIS — Z9889 Other specified postprocedural states: Secondary | ICD-10-CM

## 2015-10-24 LAB — GLUCOSE, SEROUS FLUID: Glucose, Fluid: <20 mg/dL

## 2015-10-24 LAB — BODY FLUID CELL COUNT WITH DIFFERENTIAL
EOS FL: 0 %
Lymphs, Fluid: 20 %
MONOCYTE-MACROPHAGE-SEROUS FLUID: 18 %
NEUTROPHIL FLUID: 62 %
WBC FLUID: 5977 uL

## 2015-10-24 LAB — COMPREHENSIVE METABOLIC PANEL
ALT: 16 U/L (ref 14–54)
AST: 21 U/L (ref 15–41)
Albumin: 3.6 g/dL (ref 3.5–5.0)
Alkaline Phosphatase: 125 U/L (ref 38–126)
Anion gap: 15 (ref 5–15)
BILIRUBIN TOTAL: 1 mg/dL (ref 0.3–1.2)
BUN: 12 mg/dL (ref 6–20)
CALCIUM: 9.8 mg/dL (ref 8.9–10.3)
CO2: 22 mmol/L (ref 22–32)
CREATININE: 0.68 mg/dL (ref 0.44–1.00)
Chloride: 96 mmol/L — ABNORMAL LOW (ref 101–111)
GFR calc Af Amer: >60 mL/min (ref >60)
Glucose, Bld: 80 mg/dL (ref 65–99)
Potassium: 4 mmol/L (ref 3.5–5.1)
Sodium: 133 mmol/L — ABNORMAL LOW (ref 135–145)
TOTAL PROTEIN: 8.2 g/dL — AB (ref 6.5–8.1)

## 2015-10-24 LAB — CBC
HEMATOCRIT: 34.3 % — AB (ref 35.0–47.0)
Hemoglobin: 11.5 g/dL — ABNORMAL LOW (ref 12.0–16.0)
MCH: 28.8 pg (ref 26.0–34.0)
MCHC: 33.5 g/dL (ref 32.0–36.0)
MCV: 85.9 fL (ref 80.0–100.0)
Platelets: 279 10*3/uL (ref 150–440)
RBC: 3.99 MIL/uL (ref 3.80–5.20)
RDW: 23 % — ABNORMAL HIGH (ref 11.5–14.5)
WBC: 14.7 10*3/uL — AB (ref 3.6–11.0)

## 2015-10-24 LAB — LACTATE DEHYDROGENASE, PLEURAL OR PERITONEAL FLUID: LD, Fluid: 2575 U/L — ABNORMAL HIGH (ref 3–23)

## 2015-10-24 LAB — TROPONIN I
TROPONIN I: 0.08 ng/mL — AB (ref <0.03)
Troponin I: <0.03 ng/mL (ref <0.03)
Troponin I: <0.03 ng/mL (ref <0.03)

## 2015-10-24 LAB — HEMOGLOBIN A1C: Hgb A1c MFr Bld: 6.5 % — ABNORMAL HIGH (ref 4.0–6.0)

## 2015-10-24 LAB — GLUCOSE, CAPILLARY
Glucose-Capillary: 68 mg/dL (ref 65–99)
Glucose-Capillary: 108 mg/dL — ABNORMAL HIGH (ref 65–99)
Glucose-Capillary: 124 mg/dL — ABNORMAL HIGH (ref 65–99)

## 2015-10-24 LAB — LIPASE, BLOOD: Lipase: 14 U/L (ref 11–51)

## 2015-10-24 LAB — TSH: TSH: 2.383 u[IU]/mL (ref 0.350–4.500)

## 2015-10-24 MED ORDER — ACETAMINOPHEN 650 MG RE SUPP: 650.0000 mg | Freq: Four times a day (QID) | RECTAL | Status: DC | PRN

## 2015-10-24 MED ORDER — ACETAMINOPHEN 325 MG PO TABS: 650.0000 mg | ORAL_TABLET | Freq: Four times a day (QID) | ORAL | Status: DC | PRN

## 2015-10-24 MED ORDER — FOLIC ACID 1 MG PO TABS
1.0000 mg | ORAL_TABLET | Freq: Every day | ORAL | Status: DC
  Administered 2015-10-24 – 2015-10-25 (×2): 1 mg via ORAL
  Filled 2015-10-24 (×2): qty 1

## 2015-10-24 MED ORDER — ALENDRONATE SODIUM 70 MG PO TABS: 70.0000 mg | ORAL_TABLET | ORAL | Status: DC

## 2015-10-24 MED ORDER — ALBUTEROL SULFATE (2.5 MG/3ML) 0.083% IN NEBU: 2.5000 mg | INHALATION_SOLUTION | Freq: Four times a day (QID) | RESPIRATORY_TRACT | Status: DC | PRN

## 2015-10-24 MED ORDER — MORPHINE SULFATE ER 30 MG PO TBCR
30.0000 mg | EXTENDED_RELEASE_TABLET | Freq: Two times a day (BID) | ORAL | Status: DC
  Administered 2015-10-24 (×2): 30 mg via ORAL
  Filled 2015-10-24 (×2): qty 1

## 2015-10-24 MED ORDER — HYDROMORPHONE HCL 1 MG/ML IJ SOLN
0.5000 mg | Freq: Once | INTRAMUSCULAR | Status: AC
  Administered 2015-10-24: 0.5 mg via INTRAVENOUS
  Filled 2015-10-24: qty 1

## 2015-10-24 MED ORDER — LEVOFLOXACIN 500 MG PO TABS
500.0000 mg | ORAL_TABLET | Freq: Every day | ORAL | Status: DC
  Administered 2015-10-24 – 2015-10-25 (×2): 500 mg via ORAL
  Filled 2015-10-24 (×2): qty 1

## 2015-10-24 MED ORDER — PROMETHAZINE HCL 25 MG RE SUPP
25.0000 mg | Freq: Four times a day (QID) | RECTAL | Status: DC | PRN
  Filled 2015-10-24: qty 1

## 2015-10-24 MED ORDER — SODIUM CHLORIDE 0.9% FLUSH
3.0000 mL | Freq: Two times a day (BID) | INTRAVENOUS | Status: DC
  Administered 2015-10-24: 3 mL via INTRAVENOUS

## 2015-10-24 MED ORDER — NITROGLYCERIN 0.4 MG SL SUBL: 0.4000 mg | SUBLINGUAL_TABLET | SUBLINGUAL | Status: DC | PRN

## 2015-10-24 MED ORDER — DEXTROSE-NACL 5-0.9 % IV SOLN
INTRAVENOUS | Status: DC
  Administered 2015-10-24 (×2): via INTRAVENOUS

## 2015-10-24 MED ORDER — MIRTAZAPINE 15 MG PO TABS
45.0000 mg | ORAL_TABLET | Freq: Every day | ORAL | Status: DC
  Administered 2015-10-24: 45 mg via ORAL
  Filled 2015-10-24: qty 3

## 2015-10-24 MED ORDER — FLUTICASONE PROPIONATE HFA 220 MCG/ACT IN AERO: 1.0000 | INHALATION_SPRAY | Freq: Two times a day (BID) | RESPIRATORY_TRACT | Status: DC

## 2015-10-24 MED ORDER — DULOXETINE HCL 30 MG PO CPEP
90.0000 mg | ORAL_CAPSULE | Freq: Every day | ORAL | Status: DC
  Administered 2015-10-24 – 2015-10-25 (×2): 90 mg via ORAL
  Filled 2015-10-24 (×2): qty 3

## 2015-10-24 MED ORDER — DOCUSATE SODIUM 100 MG PO CAPS
100.0000 mg | ORAL_CAPSULE | Freq: Two times a day (BID) | ORAL | Status: DC
  Administered 2015-10-24 – 2015-10-25 (×3): 100 mg via ORAL
  Filled 2015-10-24 (×3): qty 1

## 2015-10-24 MED ORDER — LIDOCAINE-PRILOCAINE 2.5-2.5 % EX CREA
1.0000 "application " | TOPICAL_CREAM | CUTANEOUS | Status: DC | PRN
  Filled 2015-10-24: qty 5

## 2015-10-24 MED ORDER — ALBUTEROL SULFATE HFA 108 (90 BASE) MCG/ACT IN AERS: 2.0000 | INHALATION_SPRAY | Freq: Four times a day (QID) | RESPIRATORY_TRACT | Status: DC | PRN

## 2015-10-24 MED ORDER — INSULIN ASPART 100 UNIT/ML ~~LOC~~ SOLN: 0.0000 [IU] | Freq: Every day | SUBCUTANEOUS | Status: DC

## 2015-10-24 MED ORDER — PANTOPRAZOLE SODIUM 40 MG PO TBEC
40.0000 mg | DELAYED_RELEASE_TABLET | Freq: Every day | ORAL | Status: DC
  Administered 2015-10-24 – 2015-10-25 (×2): 40 mg via ORAL
  Filled 2015-10-24 (×2): qty 1

## 2015-10-24 MED ORDER — APIXABAN 5 MG PO TABS
5.0000 mg | ORAL_TABLET | Freq: Two times a day (BID) | ORAL | Status: DC
  Administered 2015-10-24 – 2015-10-25 (×3): 5 mg via ORAL
  Filled 2015-10-24 (×3): qty 1

## 2015-10-24 MED ORDER — HYDROCODONE-ACETAMINOPHEN 5-325 MG PO TABS
1.0000 | ORAL_TABLET | ORAL | Status: DC | PRN
  Administered 2015-10-24 (×2): 2 via ORAL
  Filled 2015-10-24 (×2): qty 2

## 2015-10-24 MED ORDER — LORATADINE 10 MG PO TABS
10.0000 mg | ORAL_TABLET | Freq: Every day | ORAL | Status: DC
  Administered 2015-10-24 – 2015-10-25 (×2): 10 mg via ORAL
  Filled 2015-10-24 (×2): qty 1

## 2015-10-24 MED ORDER — METOPROLOL TARTRATE 25 MG PO TABS
25.0000 mg | ORAL_TABLET | Freq: Two times a day (BID) | ORAL | Status: DC
  Administered 2015-10-24 (×2): 25 mg via ORAL
  Filled 2015-10-24 (×3): qty 1

## 2015-10-24 MED ORDER — INSULIN ASPART 100 UNIT/ML ~~LOC~~ SOLN
0.0000 [IU] | Freq: Three times a day (TID) | SUBCUTANEOUS | Status: DC
  Administered 2015-10-25 (×2): 1 [IU] via SUBCUTANEOUS
  Filled 2015-10-24 (×2): qty 1

## 2015-10-24 MED ORDER — PROCHLORPERAZINE EDISYLATE 5 MG/ML IJ SOLN: 10.0000 mg | Freq: Four times a day (QID) | INTRAMUSCULAR | Status: DC | PRN

## 2015-10-24 MED ORDER — TRAZODONE HCL 50 MG PO TABS
150.0000 mg | ORAL_TABLET | Freq: Every day | ORAL | Status: DC
  Administered 2015-10-24: 150 mg via ORAL
  Filled 2015-10-24: qty 1

## 2015-10-24 MED ORDER — HYDROMORPHONE HCL 1 MG/ML IJ SOLN
1.0000 mg | Freq: Once | INTRAMUSCULAR | Status: AC
  Administered 2015-10-24: 1 mg via INTRAVENOUS
  Filled 2015-10-24: qty 1

## 2015-10-24 MED ORDER — OXYCODONE-ACETAMINOPHEN 5-325 MG PO TABS
1.0000 | ORAL_TABLET | Freq: Three times a day (TID) | ORAL | Status: DC | PRN
  Administered 2015-10-24: 1 via ORAL
  Filled 2015-10-24: qty 1

## 2015-10-24 MED ORDER — ONDANSETRON HCL 4 MG PO TABS
8.0000 mg | ORAL_TABLET | Freq: Three times a day (TID) | ORAL | Status: DC | PRN
  Administered 2015-10-24: 8 mg via ORAL
  Filled 2015-10-24: qty 2

## 2015-10-24 MED ORDER — ENOXAPARIN SODIUM 40 MG/0.4ML ~~LOC~~ SOLN: 40.0000 mg | SUBCUTANEOUS | Status: DC

## 2015-10-24 MED ORDER — GABAPENTIN 400 MG PO CAPS
800.0000 mg | ORAL_CAPSULE | Freq: Three times a day (TID) | ORAL | Status: DC
  Administered 2015-10-24 – 2015-10-25 (×3): 800 mg via ORAL
  Filled 2015-10-24 (×4): qty 2

## 2015-10-24 MED ORDER — MAGNESIUM OXIDE 400 (241.3 MG) MG PO TABS
400.0000 mg | ORAL_TABLET | Freq: Every day | ORAL | Status: DC
  Administered 2015-10-24: 400 mg via ORAL
  Filled 2015-10-24: qty 1

## 2015-10-24 MED ORDER — ONDANSETRON HCL 4 MG/2ML IJ SOLN
4.0000 mg | Freq: Once | INTRAMUSCULAR | Status: AC
  Administered 2015-10-24: 4 mg via INTRAVENOUS
  Filled 2015-10-24: qty 2

## 2015-10-24 MED ORDER — BUDESONIDE 0.5 MG/2ML IN SUSP
0.5000 mg | Freq: Two times a day (BID) | RESPIRATORY_TRACT | Status: DC
  Administered 2015-10-24 – 2015-10-25 (×3): 0.5 mg via RESPIRATORY_TRACT
  Filled 2015-10-24 (×2): qty 2

## 2015-10-24 MED ORDER — ASPIRIN EC 81 MG PO TBEC
81.0000 mg | DELAYED_RELEASE_TABLET | Freq: Every day | ORAL | Status: DC
  Administered 2015-10-24 – 2015-10-25 (×2): 81 mg via ORAL
  Filled 2015-10-24 (×2): qty 1

## 2015-10-24 MED ORDER — SIMVASTATIN 40 MG PO TABS
20.0000 mg | ORAL_TABLET | Freq: Every day | ORAL | Status: DC
  Administered 2015-10-24: 20 mg via ORAL
  Filled 2015-10-24: qty 1

## 2015-10-24 NOTE — ED Notes (Signed)
Informed RN bed ready [phone]

## 2015-10-24 NOTE — ED Notes (Signed)
Pt family leaving to go home for a while  Bed assignment provided to them  Pt resting quietly at this time

## 2015-10-24 NOTE — ED Notes (Signed)
Pt. Returned to tx. Room.

## 2015-10-24 NOTE — ED Notes (Addendum)
Pt. Verbalizes new pain in her epigastric region. Pt. Verbalizes seeing things when she is in pain that aren't really there (referring to the "tampon" she thought was in the toilet but denies presence of bleeding with BM). Pt. Stating "I can't leave my daughter, we're family and we will always be together".   Pt. Presents to tx. Room diaphoretic and lethargic. Pt. Reporting 10/10 pain, with nausea. Pt. Tender to touch to epigastric region, wincing upon palpation.

## 2015-10-24 NOTE — Progress Notes (Signed)
Assessment and plan  60 year old female with past medical history of metastatic lung cancer, recurrent pleural effusion, chronic pain, hypertension, hyperlipidemia, COPD, diabetes who presents to the hospital due to chest pain.  1. Chest pain-atypical and unlikely cardiac in nature even though she has a mildly elevated troponin. -Observe on telemetry, cycle her cardiac markers. -Continue supportive care with pain control for now.  2. Pleural effusion-patient noted to have a left-sided pleural effusion on chest x-ray. Patient has had a pleurodesis today in the past. Questionable of this is truly pleural effusion versus underlying scarring. -We'll attempt to get ultrasound-guided thoracentesis for therapeutic reasons.  3. Hx of DM - cont. SSI and follow blood sugar.   4. Depression - cont. Cymbalta  5. Chronic Pain - cont. Dilaudid, MS contin, Percocet PRN  6. Hyperlipidemia - cont. Simvastatin.   7. Hx of Previous PE - cont. Eliquis.

## 2015-10-24 NOTE — ED Triage Notes (Signed)
Patient here with complaint of severe left lower back pain that started last week that has become worse. Patient actively vomiting and diaphoretic in triage. Patient has lung ca and currently receiving chemo.

## 2015-10-24 NOTE — Progress Notes (Signed)
Pt alert and oriented x4,  complaints of pain and  discomfort. Medication administered.  Bed in low position, call bell within reach.  Bed alarms on and functioning.  Assessment done and charted.  Will continue to monitor and do hourly rounding throughout the shift

## 2015-10-24 NOTE — ED Notes (Signed)
Admitting provider to room  - IV pain med order received  Assessment completed  Pt diaphoretic  9/10 pain reported

## 2015-10-24 NOTE — ED Notes (Signed)
Patient transported to X-ray 

## 2015-10-24 NOTE — ED Notes (Signed)
EDP at bedside  

## 2015-10-24 NOTE — ED Notes (Signed)
Daughter, Caroljean Monsivais, Call with update please. [phone]

## 2015-10-24 NOTE — ED Notes (Signed)
MD at bedside, plan to admit pt. Told to pt and family at bedside.

## 2015-10-24 NOTE — H&P (Signed)
Charlene Williams is an 60 y.o. female.   Chief Complaint: Chest pain HPI: The patient with past medical history significant for stage IV lung cancer with recently drained malignant effusion status post pleurodesis, as well as diabetes and COPD, presents to the emergency department complaining of chest pain. She states that her right-sided chest pain has become so great that she cannot bear it. She admits that she's had worsening chest pain for the last 2 days. Chest x-ray in the emergency department showed metastases to the vertebrae as well as ribs. Recurrent effusion was also evident. Laboratory evaluation  revealed mildly elevated troponin. The patient was diaphoretic and mildly nauseated which prompted the emergency department staff to call for admission.  Past Medical History:  Diagnosis Date  . Aneurysm ([place])   . Chest pain, pleuritic   . COPD (chronic obstructive pulmonary disease) ([place])    on home oxygen  . Depression   . Diabetes mellitus without complication ([place])   . Family history of chronic pain 01/17/2015  . Hiatal hernia   . Hypercholesteremia   . Hypertension   . Illicit drug use   . Lung cancer ([place])    adenocarcinoma left lung  . Migraines   . Sleep apnea   . Spine disorder     Past Surgical History:  Procedure Laterality Date  . ABDOMINAL HYSTERECTOMY    . EYE SURGERY    . HERNIA REPAIR    . PORTACATH PLACEMENT Left 07/14/2015   Procedure: INSERTION PORT-A-CATH;  Surgeon: Nestor Lewandowsky, MD;  Location: [facility] ORS;  Service: General;  Laterality: Left;  . REMOVAL OF PLEURAL DRAINAGE CATHETER N/A 07/18/2015   Procedure: REMOVAL OF PLEURAL DRAINAGE CATHETER;  Surgeon: Nestor Lewandowsky, MD;  Location: [facility] ORS;  Service: Thoracic;  Laterality: N/A;  . VIDEO ASSISTED THORACOSCOPY (VATS)/THOROCOTOMY Left 07/14/2015   Procedure: VIDEO ASSISTED THORACOSCOPY (VATS), pleural biopsy;  Surgeon: Nestor Lewandowsky, MD;  Location: [facility] ORS;  Service: General;  Laterality: Left;    Family  History  Problem Relation Age of Onset  . Heart disease Mother   . Breast cancer Sister    Social History:  reports that she quit smoking about 5 months ago. Her smoking use included Cigarettes. She has a 67.50 pack-year smoking history. She has never used smokeless tobacco. She reports that she does not drink alcohol. Her drug history is not on file.  Allergies:  Allergies  Allergen Reactions  . Ampicillin Hives and Other (See Comments)    Has patient had a PCN reaction causing immediate rash, facial/tongue/throat swelling, SOB or lightheadedness with hypotension: No Has patient had a PCN reaction causing severe rash involving mucus membranes or skin necrosis: No Has patient had a PCN reaction that required hospitalization No Has patient had a PCN reaction occurring within the last 10 years: Yes If all of the above answers are "NO", then may proceed with Cephalosporin use.  Marland Kitchen Penicillins Hives and Other (See Comments)    Has patient had a PCN reaction causing immediate rash, facial/tongue/throat swelling, SOB or lightheadedness with hypotension: No Has patient had a PCN reaction causing severe rash involving mucus membranes or skin necrosis: No Has patient had a PCN reaction that required hospitalization No Has patient had a PCN reaction occurring within the last 10 years: Yes If all of the above answers are "NO", then may proceed with Cephalosporin use.     (Not in a hospital admission)  Results for orders placed or performed during the hospital encounter of 10/24/15 (from  the past 48 hour(s))  CBC     Status: Abnormal   Collection Time: 10/24/15  4:12 AM  Result Value Ref Range   WBC 14.7 (H) 3.6 - 11.0 K/uL   RBC 3.99 3.80 - 5.20 MIL/uL   Hemoglobin 11.5 (L) 12.0 - 16.0 g/dL   HCT 34.3 (L) 35.0 - 47.0 %   MCV 85.9 80.0 - 100.0 fL   MCH 28.8 26.0 - 34.0 pg   MCHC 33.5 32.0 - 36.0 g/dL   RDW 23.0 (H) 11.5 - 14.5 %   Platelets 279 150 - 440 K/uL  Comprehensive metabolic panel      Status: Abnormal   Collection Time: 10/24/15  4:12 AM  Result Value Ref Range   Sodium 133 (L) 135 - 145 mmol/L   Potassium 4.0 3.5 - 5.1 mmol/L   Chloride 96 (L) 101 - 111 mmol/L   CO2 22 22 - 32 mmol/L   Glucose, Bld 80 65 - 99 mg/dL   BUN 12 6 - 20 mg/dL   Creatinine, Ser 0.68 0.44 - 1.00 mg/dL   Calcium 9.8 8.9 - 10.3 mg/dL   Total Protein 8.2 (H) 6.5 - 8.1 g/dL   Albumin 3.6 3.5 - 5.0 g/dL   AST 21 15 - 41 U/L   ALT 16 14 - 54 U/L   Alkaline Phosphatase 125 38 - 126 U/L   Total Bilirubin 1.0 0.3 - 1.2 mg/dL   GFR calc non Af Amer >60 >60 mL/min   GFR calc Af Amer >60 >60 mL/min    Comment: (NOTE) The eGFR has been calculated using the CKD EPI equation. This calculation has not been validated in all clinical situations. eGFR's persistently <60 mL/min signify possible Chronic Kidney Disease.    Anion gap 15 5 - 15  Troponin I     Status: Abnormal   Collection Time: 10/24/15  4:12 AM  Result Value Ref Range   Troponin I 0.08 (HH) <0.03 ng/mL    Comment: CRITICAL RESULT CALLED TO, READ BACK BY AND VERIFIED WITH RAQUEL DAVID AT 5462 ON 10/24/15.Marland KitchenMarland KitchenOrlando   Lipase, blood     Status: None   Collection Time: 10/24/15  4:22 AM  Result Value Ref Range   Lipase 14 11 - 51 U/L   Dg Chest 2 View  Result Date: 10/24/2015 CLINICAL DATA:  Severe LEFT lower back pain beginning last week, with vomiting and diaphoresis. History of lung cancer, on chemotherapy. EXAM: CHEST  2 VIEW COMPARISON:  Chest radiograph Aug 12, 2015 FINDINGS: Large LEFT pleural effusion, increased from prior examination with underlying consolidation/mass. RIGHT lung is clear. The cardiac silhouette is mildly enlarged. Calcified aortic knob. Single lumen LEFT chest port with distal tip projecting in proximal superior vena cava. No pneumothorax. Soft tissue planes included osseous structures are nonsuspicious. IMPRESSION: Large LEFT pleural effusion with underlying consolidation/mass. Electronically Signed   By: Elon Alas M.D.   On: 10/24/2015 05:05    Review of Systems  Constitutional: Positive for diaphoresis. Negative for chills and fever.  HENT: Negative for sore throat and tinnitus.   Eyes: Negative for blurred vision and redness.  Respiratory: Negative for cough and shortness of breath.   Cardiovascular: Positive for chest pain. Negative for palpitations, orthopnea and PND.  Gastrointestinal: Negative for abdominal pain, diarrhea, nausea and vomiting.  Genitourinary: Negative for dysuria, frequency and urgency.  Musculoskeletal: Negative for joint pain and myalgias.  Skin: Negative for rash.       No lesions  Neurological: Negative  for speech change, focal weakness and weakness.  Endo/Heme/Allergies: Does not bruise/bleed easily.       No temperature intolerance  Psychiatric/Behavioral: Negative for depression and suicidal ideas.    Blood pressure (!) 155/97, pulse 94, temperature 98.9 F (37.2 C), temperature source Oral, resp. rate (!) 24, height 5' 3"  (1.6 m), weight 72.6 kg (160 lb), SpO2 (!) 89 %. Physical Exam  Vitals reviewed. Constitutional: She is oriented to person, place, and time. She appears well-developed and well-nourished. No distress.  HENT:  Head: Normocephalic and atraumatic.  Mouth/Throat: Oropharynx is clear and moist.  Eyes: Conjunctivae and EOM are normal. Pupils are equal, round, and reactive to light. No scleral icterus.  Neck: Normal range of motion. Neck supple. No JVD present. No tracheal deviation present. No thyromegaly present.  Cardiovascular: Normal rate, regular rhythm and normal heart sounds.  Exam reveals no gallop and no friction rub.   No murmur heard. Respiratory: Effort normal and breath sounds normal.  +chest wall pain (right more than left)  GI: Soft. Bowel sounds are normal. She exhibits no distension. There is no tenderness.  Genitourinary:  Genitourinary Comments: Deferred  Musculoskeletal: Normal range of motion. She exhibits no edema.   Lymphadenopathy:    She has no cervical adenopathy.  Neurological: She is alert and oriented to person, place, and time. No cranial nerve deficit. She exhibits normal muscle tone.  Skin: Skin is warm and dry. No rash noted. No erythema.  Psychiatric: She has a normal mood and affect. Her behavior is normal. Judgment and thought content normal.     Assessment/Plan This is a 60 year old female admitted for chest pain. 1. Chest pain: Multifactorial. Troponin is elevated indicating some demand ischemia however the patient also has reproducible musculoskeletal pain likely secondary to metastatic cancer to her ribs as well as her spine. The patient also has a recurrent malignant effusion on the left, however her pain is more on the right side of her chest. Continue to follow troponin. Monitor telemetry. Cardiology has been consulted. 2. COPD: Stable. Continue Flovent  3. History of pulmonary embolism: Continue Eliquis 4. Diabetes mellitus type 2: Hold metformin. Sliding scale insulin while hospitalized 5. Essential hypertension: Continue metoprolol 6. Hyperlipidemia: Continue statin therapy 7. Stage IV adenocarcinoma of the lung: due for chemotherapy tomorrow. Continue Levaquin prophylactically Exline 8. DVT prophylaxis: As above 9. GI prophylaxis: None The patient is a full code. Time spent on admission was inpatient care approximately 45 minutes   Harrie Foreman, MD 10/24/2015, 8:21 AM

## 2015-10-24 NOTE — ED Provider Notes (Signed)
[facility] Emergency Department Provider Note  ____________________________________________   First MD Initiated Contact with Patient 10/24/15 0406     (approximate)  I have reviewed the triage vital signs and the nursing notes.   HISTORY  Chief Complaint No chief complaint on file.    HPI Charlene Williams is a 60 y.o. female with history of lung cancer presents with upper back pain radiating to bilateral chests and sides currently 10 out of 10 accompanied by dyspnea that is progressively worsened over the course of the evening. Patient diaphoretic on presentation to the emergency department. Patient and family stated that her lung cancer was confined to her lung at it and had not (spread) metastasized anywhere   Past Medical History:  Diagnosis Date  . Aneurysm ([place])   . Chest pain, pleuritic   . COPD (chronic obstructive pulmonary disease) ([place])    on home oxygen  . Depression   . Diabetes mellitus without complication ([place])   . Family history of chronic pain 01/17/2015  . Hiatal hernia   . Hypercholesteremia   . Hypertension   . Illicit drug use   . Lung cancer ([place])    adenocarcinoma left lung  . Migraines   . Sleep apnea   . Spine disorder     Patient Active Problem List   Diagnosis Date Noted  . Left upper extremity swelling 10/04/2015  . Elevated serum creatinine 09/13/2015  . Sepsis ([place]) 08/09/2015  . HCAP (healthcare-associated pneumonia) 08/09/2015  . Pulmonary embolism ([place]) 07/29/2015  . Primary cancer of left lower lobe of lung ([place]) 07/19/2015  . Pleural effusion   . Recurrent left pleural effusion   . COPD (chronic obstructive pulmonary disease) ([place]) 07/11/2015  . SIRS (systemic inflammatory response syndrome) ([place]) 06/25/2015  . Pleural effusion, left 06/25/2015  . Left lower lobe pneumonia 06/25/2015  . Hypoxemia 06/25/2015  . Hyponatremia 06/25/2015  . Elevated troponin 06/25/2015  . Generalized weakness  06/25/2015  . Chronic lower extremity pain (Bilateral) 05/19/2015  . Substance use disorder (SUB Risk: Very high) 05/19/2015  . Myofascial pain 05/15/2015  . Muscle spasms of lower extremity 05/15/2015  . Chronic sacroiliac joint pain (Location of Primary Source of Pain) (Bilateral) (R>L) 05/15/2015  . Neurogenic pain 05/15/2015  . Avitaminosis D 05/15/2015  . Chronic pain syndrome 01/17/2015  . Chronic pain 01/17/2015  . Lumbar facet syndrome (Location of Primary Source of Pain) (Bilateral) (R>L) 01/17/2015  . Chronic low back pain (Location of Primary Source of Pain) (Bilateral) (R>L) 01/17/2015  . Chronic radicular lumbar pain (Bilateral) 01/17/2015  . Osteoarthritis of spine with radiculopathy, lumbar region 01/17/2015  . Polysubstance abuse 01/17/2015  . Lumbar spondylosis 01/17/2015  . Lumbar facet arthropathy (Bilateral) 01/17/2015  . Magnesium deficiency 01/17/2015  . Lumbar spinal stenosis (Central Spinal Stenosis) (Severe) (L3-4 and L4-5) 01/17/2015  . Family history of chronic pain 01/17/2015  . Nicotine dependence 01/17/2015  . Smoker 01/17/2015  . Tobacco abuse 01/17/2015  . Essential hypertension, benign 01/17/2015  . Chronic obstructive pulmonary disease (COPD) ([place]) 01/17/2015  . Emphysema of lung ([place]) 01/17/2015  . History of bronchitis 01/17/2015  . History of shortness of breath 01/17/2015  . Generalized anxiety disorder 01/17/2015  . Seizure disorder ([place]) 01/17/2015  . Depression 01/17/2015  . History of panic attacks 01/17/2015  . Non-insulin dependent type 2 diabetes mellitus ([place]) 01/17/2015    Past Surgical History:  Procedure Laterality Date  . ABDOMINAL HYSTERECTOMY    . EYE SURGERY    .  HERNIA REPAIR    . PORTACATH PLACEMENT Left 07/14/2015   Procedure: INSERTION PORT-A-CATH;  Surgeon: Nestor Lewandowsky, MD;  Location: [facility] ORS;  Service: General;  Laterality: Left;  . REMOVAL OF PLEURAL DRAINAGE CATHETER N/A 07/18/2015   Procedure: REMOVAL OF PLEURAL  DRAINAGE CATHETER;  Surgeon: Nestor Lewandowsky, MD;  Location: [facility] ORS;  Service: Thoracic;  Laterality: N/A;  . VIDEO ASSISTED THORACOSCOPY (VATS)/THOROCOTOMY Left 07/14/2015   Procedure: VIDEO ASSISTED THORACOSCOPY (VATS), pleural biopsy;  Surgeon: Nestor Lewandowsky, MD;  Location: [facility] ORS;  Service: General;  Laterality: Left;    Prior to Admission medications   Medication Sig Start Date End Date Taking? Authorizing Provider  albuterol (PROVENTIL HFA;VENTOLIN HFA) 108 (90 Base) MCG/ACT inhaler Inhale 2 puffs into the lungs every 6 (six) hours as needed for wheezing or shortness of breath. 08/12/15   Gladstone Lighter, MD  alendronate (FOSAMAX) 70 MG tablet Take 70 mg by mouth once a week. Pt takes on Tuesday.   Take with a full glass of water on an empty stomach.    Historical Provider, MD  apixaban (ELIQUIS) 5 MG TABS tablet Take 1 tablet (5 mg total) by mouth 2 (two) times daily. 07/30/15   Henreitta Leber, MD  cetirizine (ZYRTEC) 10 MG tablet Take 10 mg by mouth at bedtime.    Historical Provider, MD  DULoxetine (CYMBALTA) 30 MG capsule Take 90 mg by mouth daily.     Historical Provider, MD  fluticasone (FLOVENT HFA) 220 MCG/ACT inhaler Inhale 1 puff into the lungs 2 (two) times daily.    Historical Provider, MD  folic acid (FOLVITE) 1 MG tablet Take 1 mg by mouth daily.    Historical Provider, MD  gabapentin (NEURONTIN) 800 MG tablet Take 800 mg by mouth 3 (three) times daily.    Historical Provider, MD  levofloxacin (LEVAQUIN) 500 MG tablet Take 1 tablet (500 mg total) by mouth daily. 08/12/15   Gladstone Lighter, MD  lidocaine-prilocaine (EMLA) cream Apply 1 application topically as needed (prior to accessing port).    Historical Provider, MD  magnesium oxide (MAG-OX) 400 (241.3 Mg) MG tablet Take 400 mg by mouth at bedtime.    Historical Provider, MD  metFORMIN (GLUCOPHAGE-XR) 750 MG 24 hr tablet Take 750 mg by mouth daily with breakfast.    Historical Provider, MD  metoprolol tartrate (LOPRESSOR)  25 MG tablet Take 1 tablet (25 mg total) by mouth 2 (two) times daily. 06/27/15   Bettey Costa, MD  mirtazapine (REMERON) 45 MG tablet Take 45 mg by mouth at bedtime.    Historical Provider, MD  morphine (MS CONTIN) 30 MG 12 hr tablet Take 1 tablet (30 mg total) by mouth every 12 (twelve) hours. 10/04/15   Cammie Sickle, MD  omeprazole (PRILOSEC) 20 MG capsule Take 20 mg by mouth daily before breakfast.     Historical Provider, MD  ondansetron (ZOFRAN) 8 MG tablet Take 8 mg by mouth every 8 (eight) hours as needed for nausea or vomiting.    Historical Provider, MD  oxyCODONE-acetaminophen (ROXICET) 5-325 MG tablet Take 1 tablet by mouth every 8 (eight) hours as needed for moderate pain or severe pain. 10/15/15   Schuyler Amor, MD  prochlorperazine (COMPAZINE) 10 MG tablet TAKE 1 TABLET ('10MG'$ ) BY MOUTH EVERY SIX HOURS AS NEEDED FOR NAUSEA OR VOMITING 09/01/15   Cammie Sickle, MD  promethazine (PHENERGAN) 25 MG suppository Place 1 suppository (25 mg total) rectally every 6 (six) hours as needed for nausea or vomiting. 07/30/15  Henreitta Leber, MD  simvastatin (ZOCOR) 20 MG tablet Take 20 mg by mouth at bedtime.     Historical Provider, MD  traZODone (DESYREL) 150 MG tablet Take 150 mg by mouth at bedtime.    Historical Provider, MD    Allergies Ampicillin and Penicillins  Family History  Problem Relation Age of Onset  . Heart disease Mother   . Breast cancer Sister     Social History Social History  Substance Use Topics  . Smoking status: Former Smoker    Packs/day: 1.50    Years: 45.00    Types: Cigarettes    Quit date: 04/29/2015  . Smokeless tobacco: Never Used     Comment: Quit 2 months ago  . Alcohol use No    Review of Systems Constitutional: No fever/chills Eyes: No visual changes. ENT: No sore throat. Cardiovascular: Positive chest pain. Respiratory: Denies shortness of breath. Gastrointestinal: No abdominal pain.  No nausea, no vomiting.  No diarrhea.  No  constipation. Genitourinary: Negative for dysuria. Musculoskeletal: Positive for back pain. Skin: Negative for rash. Neurological: Negative for headaches, focal weakness or numbness.  10-point ROS otherwise negative.  ____________________________________________   PHYSICAL EXAM:  VITAL SIGNS: ED Triage Vitals  Enc Vitals Group     BP 10/24/15 0357 (!) 198/109     Pulse Rate 10/24/15 0357 (!) 120     Resp 10/24/15 0357 (!) 22     Temp 10/24/15 0406 98.9 F (37.2 C)     Temp Source 10/24/15 0406 Oral     SpO2 10/24/15 0357 96 %     Weight 10/24/15 0357 160 lb (72.6 kg)     Height 10/24/15 0357 '5\' 3"'$  (1.6 m)     Head Circumference --      Peak Flow --      Pain Score 10/24/15 0358 10     Pain Loc --      Pain Edu? --      Excl. in [place]? --     Constitutional: Alert and oriented. Apparent discomfort Eyes: Conjunctivae are normal. PERRL. EOMI. Head: Atraumatic. Ears:  Healthy appearing ear canals and TMs bilaterally Nose: No congestion/rhinnorhea. Mouth/Throat: Mucous membranes are moist.  Oropharynx non-erythematous. Neck: No stridor.  No meningeal signs.   Cardiovascular: Normal rate, regular rhythm. Good peripheral circulation. Grossly normal heart sounds.   Respiratory: Normal respiratory effort.  No retractions. Rales left lung field Gastrointestinal: Soft and nontender. No distention.  Genitourinary:  Musculoskeletal: No lower extremity tenderness nor edema. No gross deformities of extremities. Diffuse thoracic spine tenderness with palpation Neurologic:  Normal speech and language. No gross focal neurologic deficits are appreciated.  Skin:  Skin is warm, dry and intact. No rash noted. Psychiatric: Mood and affect are normal. Speech and behavior are normal.**}  ____________________________________________   LABS (all labs ordered are listed, but only abnormal results are displayed)  Labs Reviewed  CBC - Abnormal; Notable for the following:       Result Value    WBC 14.7 (*)    Hemoglobin 11.5 (*)    HCT 34.3 (*)    RDW 23.0 (*)    All other components within normal limits  COMPREHENSIVE METABOLIC PANEL - Abnormal; Notable for the following:    Sodium 133 (*)    Chloride 96 (*)    Total Protein 8.2 (*)    All other components within normal limits  TROPONIN I - Abnormal; Notable for the following:    Troponin I 0.08 (*)  All other components within normal limits  LIPASE, BLOOD   ____________________________________________  EKG  ED ECG REPORT I, Tripp N BROWN, the attending physician, personally viewed and interpreted this ECG.   Date: 10/24/2015  EKG Time: 4:08 AM  Rate: 113  Rhythm: Sinus tachycardia  Axis: Normal  Intervals: Normal  ST&T Change: None  ____________________________________________  RADIOLOGY I, Freeman [place] N BROWN, personally viewed and evaluated these images (plain radiographs) as part of my medical decision making, as well as reviewing the written report by the radiologist.  Dg Chest 2 View  Result Date: 10/24/2015 CLINICAL DATA:  Severe LEFT lower back pain beginning last week, with vomiting and diaphoresis. History of lung cancer, on chemotherapy. EXAM: CHEST  2 VIEW COMPARISON:  Chest radiograph Aug 12, 2015 FINDINGS: Large LEFT pleural effusion, increased from prior examination with underlying consolidation/mass. RIGHT lung is clear. The cardiac silhouette is mildly enlarged. Calcified aortic knob. Single lumen LEFT chest port with distal tip projecting in proximal superior vena cava. No pneumothorax. Soft tissue planes included osseous structures are nonsuspicious. IMPRESSION: Large LEFT pleural effusion with underlying consolidation/mass. Electronically Signed   By: Elon Alas M.D.   On: 10/24/2015 05:05      Procedures   ____________________________________________   INITIAL IMPRESSION / ASSESSMENT AND PLAN / ED COURSE  Pertinent labs & imaging results that were available during my care of  the patient were reviewed by me and considered in my medical decision making (see chart for details).  Review of the patient's CT scan from July 13 revealed T8 lytic lesion concerning for metastatic disease as well as lytic lesion possibly the fifth rib. Patient received IV Dilaudid with pain improvement from 10-8. Given elevated troponin of 0.08 and continued pain patient discussed with Dr. Marcille Blanco for hospital admission for further evaluation and management.  Clinical Course    ____________________________________________  FINAL CLINICAL IMPRESSION(S) / ED DIAGNOSES  Final diagnoses:  Metastatic lung cancer (metastasis from lung to other site), unspecified laterality (HCC)  Chest pain, unspecified chest pain type  Elevated troponin     MEDICATIONS GIVEN DURING THIS VISIT:  Medications  HYDROmorphone (DILAUDID) injection 1 mg (1 mg Intravenous Given 10/24/15 0416)  ondansetron (ZOFRAN) injection 4 mg (4 mg Intravenous Given 10/24/15 0416)     NEW OUTPATIENT MEDICATIONS STARTED DURING THIS VISIT:  New Prescriptions   No medications on file      Note:  This document was prepared using Dragon voice recognition software and may include unintentional dictation errors.    Gregor Hams, MD 10/24/15 [phone]

## 2015-10-24 NOTE — Progress Notes (Signed)
Patient complaining of anxiety attack, requesting trazadone. Med due at bedtime, ok to give early per Dr. Margaretmary Eddy.

## 2015-10-24 NOTE — Progress Notes (Signed)
Pain noted    9/10.  Will medicate pt and reassess 30 minutes after administering the medication.  Results:

## 2015-10-25 ENCOUNTER — Inpatient Hospital Stay: Payer: Medicare Other | Admitting: Internal Medicine

## 2015-10-25 ENCOUNTER — Other Ambulatory Visit: Payer: Self-pay | Admitting: Internal Medicine

## 2015-10-25 ENCOUNTER — Inpatient Hospital Stay: Payer: Medicare Other

## 2015-10-25 DIAGNOSIS — I10 Essential (primary) hypertension: Secondary | ICD-10-CM

## 2015-10-25 DIAGNOSIS — R0602 Shortness of breath: Secondary | ICD-10-CM

## 2015-10-25 DIAGNOSIS — R0789 Other chest pain: Secondary | ICD-10-CM | POA: Diagnosis not present

## 2015-10-25 DIAGNOSIS — E119 Type 2 diabetes mellitus without complications: Secondary | ICD-10-CM

## 2015-10-25 DIAGNOSIS — E7800 Pure hypercholesterolemia, unspecified: Secondary | ICD-10-CM

## 2015-10-25 DIAGNOSIS — Z8669 Personal history of other diseases of the nervous system and sense organs: Secondary | ICD-10-CM

## 2015-10-25 DIAGNOSIS — R079 Chest pain, unspecified: Secondary | ICD-10-CM

## 2015-10-25 DIAGNOSIS — R05 Cough: Secondary | ICD-10-CM

## 2015-10-25 DIAGNOSIS — J90 Pleural effusion, not elsewhere classified: Secondary | ICD-10-CM

## 2015-10-25 DIAGNOSIS — Z7901 Long term (current) use of anticoagulants: Secondary | ICD-10-CM

## 2015-10-25 DIAGNOSIS — C799 Secondary malignant neoplasm of unspecified site: Secondary | ICD-10-CM

## 2015-10-25 DIAGNOSIS — G4730 Sleep apnea, unspecified: Secondary | ICD-10-CM

## 2015-10-25 DIAGNOSIS — C3492 Malignant neoplasm of unspecified part of left bronchus or lung: Secondary | ICD-10-CM

## 2015-10-25 DIAGNOSIS — F17210 Nicotine dependence, cigarettes, uncomplicated: Secondary | ICD-10-CM

## 2015-10-25 DIAGNOSIS — Z7982 Long term (current) use of aspirin: Secondary | ICD-10-CM

## 2015-10-25 DIAGNOSIS — Z803 Family history of malignant neoplasm of breast: Secondary | ICD-10-CM

## 2015-10-25 DIAGNOSIS — K449 Diaphragmatic hernia without obstruction or gangrene: Secondary | ICD-10-CM

## 2015-10-25 DIAGNOSIS — Z794 Long term (current) use of insulin: Secondary | ICD-10-CM

## 2015-10-25 DIAGNOSIS — Z79899 Other long term (current) drug therapy: Secondary | ICD-10-CM

## 2015-10-25 DIAGNOSIS — C3432 Malignant neoplasm of lower lobe, left bronchus or lung: Secondary | ICD-10-CM

## 2015-10-25 LAB — TROPONIN I: Troponin I: <0.03 ng/mL (ref <0.03)

## 2015-10-25 LAB — PH, BODY FLUID: pH, Body Fluid: 7.7

## 2015-10-25 LAB — GLUCOSE, CAPILLARY
Glucose-Capillary: 121 mg/dL — ABNORMAL HIGH (ref 65–99)
Glucose-Capillary: 131 mg/dL — ABNORMAL HIGH (ref 65–99)

## 2015-10-25 MED ORDER — HYDROCODONE-ACETAMINOPHEN 5-325 MG PO TABS: 1.0000 | ORAL_TABLET | ORAL | 0 refills | Status: AC | PRN

## 2015-10-25 MED ORDER — FENTANYL 50 MCG/HR TD PT72
50.0000 ug | MEDICATED_PATCH | TRANSDERMAL | Status: DC
  Administered 2015-10-25: 50 ug via TRANSDERMAL
  Filled 2015-10-25: qty 1

## 2015-10-25 MED ORDER — FENTANYL 50 MCG/HR TD PT72: 50.0000 ug | MEDICATED_PATCH | TRANSDERMAL | 0 refills | Status: DC

## 2015-10-25 MED ORDER — ENSURE ENLIVE PO LIQD
237.0000 mL | Freq: Two times a day (BID) | ORAL | Status: DC
Start: 2015-10-25 — End: 2015-10-25
  Administered 2015-10-25: 237 mL via ORAL

## 2015-10-25 NOTE — Consult Note (Signed)
Charlene Williams is a 60 y.o. female  [id number]  Primary Cardiologist: Neoma Laming Reason for Consultation: chest pain, elevated troponin  HPI: Charlene Williams developed chest pain on Saturday that is localized to the lower rib area and radiates circumferentially around to the back. She is tender in the ribs to the touch and the pain is exacerbated by deep breathing. She is short of breath due tot he pain.   Review of Systems: Negative for palpitations, edema   Past Medical History:  Diagnosis Date  . Aneurysm ([place])   . Chest pain, pleuritic   . COPD (chronic obstructive pulmonary disease) ([place])    on home oxygen  . Depression   . Diabetes mellitus without complication ([place])   . Family history of chronic pain 01/17/2015  . Hiatal hernia   . Hypercholesteremia   . Hypertension   . Illicit drug use   . Lung cancer ([place])    adenocarcinoma left lung  . Migraines   . Sleep apnea   . Spine disorder     Medications Prior to Admission  Medication Sig Dispense Refill  . albuterol (PROVENTIL HFA;VENTOLIN HFA) 108 (90 Base) MCG/ACT inhaler Inhale 2 puffs into the lungs every 6 (six) hours as needed for wheezing or shortness of breath. 1 Inhaler 2  . alendronate (FOSAMAX) 70 MG tablet Take 70 mg by mouth once a week. Pt takes on Tuesday.   Take with a full glass of water on an empty stomach.    Marland Kitchen apixaban (ELIQUIS) 5 MG TABS tablet Take 1 tablet (5 mg total) by mouth 2 (two) times daily. 60 tablet 1  . cetirizine (ZYRTEC) 10 MG tablet Take 10 mg by mouth at bedtime.    . DULoxetine (CYMBALTA) 30 MG capsule Take 90 mg by mouth daily.     . fluticasone (FLOVENT HFA) 220 MCG/ACT inhaler Inhale 1 puff into the lungs 2 (two) times daily.    . folic acid (FOLVITE) 1 MG tablet Take 1 mg by mouth daily.    Marland Kitchen gabapentin (NEURONTIN) 800 MG tablet Take 800 mg by mouth 3 (three) times daily.    Marland Kitchen levofloxacin (LEVAQUIN) 500 MG tablet Take 1 tablet (500 mg total) by mouth daily. 5 tablet 0  .  lidocaine-prilocaine (EMLA) cream Apply 1 application topically as needed (prior to accessing port).    . magnesium oxide (MAG-OX) 400 (241.3 Mg) MG tablet Take 400 mg by mouth at bedtime.    . metFORMIN (GLUCOPHAGE-XR) 750 MG 24 hr tablet Take 750 mg by mouth daily with breakfast.    . metoprolol tartrate (LOPRESSOR) 25 MG tablet Take 1 tablet (25 mg total) by mouth 2 (two) times daily. 60 tablet 0  . mirtazapine (REMERON) 45 MG tablet Take 45 mg by mouth at bedtime.    Marland Kitchen morphine (MS CONTIN) 30 MG 12 hr tablet Take 1 tablet (30 mg total) by mouth every 12 (twelve) hours. 60 tablet 0  . omeprazole (PRILOSEC) 20 MG capsule Take 20 mg by mouth daily before breakfast.     . ondansetron (ZOFRAN) 8 MG tablet Take 8 mg by mouth every 8 (eight) hours as needed for nausea or vomiting.    Marland Kitchen oxyCODONE-acetaminophen (ROXICET) 5-325 MG tablet Take 1 tablet by mouth every 8 (eight) hours as needed for moderate pain or severe pain. 10 tablet 0  . prochlorperazine (COMPAZINE) 10 MG tablet TAKE 1 TABLET ('10MG'$ ) BY MOUTH EVERY SIX HOURS AS NEEDED FOR NAUSEA OR VOMITING 30 tablet 1  .  promethazine (PHENERGAN) 25 MG suppository Place 1 suppository (25 mg total) rectally every 6 (six) hours as needed for nausea or vomiting. 12 each 0  . simvastatin (ZOCOR) 20 MG tablet Take 20 mg by mouth at bedtime.     . traZODone (DESYREL) 150 MG tablet Take 150 mg by mouth at bedtime.       Marland Kitchen apixaban  5 mg Oral BID  . aspirin EC  81 mg Oral Daily  . budesonide (PULMICORT) nebulizer solution  0.5 mg Nebulization BID  . docusate sodium  100 mg Oral BID  . DULoxetine  90 mg Oral Daily  . feeding supplement (ENSURE ENLIVE)  237 mL Oral BID BM  . fentaNYL  50 mcg Transdermal Q72H  . folic acid  1 mg Oral Daily  . gabapentin  800 mg Oral TID  . insulin aspart  0-5 Units Subcutaneous QHS  . insulin aspart  0-9 Units Subcutaneous TID WC  . levofloxacin  500 mg Oral Daily  . loratadine  10 mg Oral Daily  . magnesium oxide  400  mg Oral QHS  . metoprolol tartrate  25 mg Oral BID  . mirtazapine  45 mg Oral QHS  . pantoprazole  40 mg Oral Daily  . simvastatin  20 mg Oral QHS  . sodium chloride flush  3 mL Intravenous Q12H  . traZODone  150 mg Oral QHS    Infusions: . dextrose 5 % and 0.9% NaCl 75 mL/hr at 10/24/15 2327    Allergies  Allergen Reactions  . Ampicillin Hives and Other (See Comments)    Has patient had a PCN reaction causing immediate rash, facial/tongue/throat swelling, SOB or lightheadedness with hypotension: No Has patient had a PCN reaction causing severe rash involving mucus membranes or skin necrosis: No Has patient had a PCN reaction that required hospitalization No Has patient had a PCN reaction occurring within the last 10 years: Yes If all of the above answers are "NO", then may proceed with Cephalosporin use.  Marland Kitchen Penicillins Hives and Other (See Comments)    Has patient had a PCN reaction causing immediate rash, facial/tongue/throat swelling, SOB or lightheadedness with hypotension: No Has patient had a PCN reaction causing severe rash involving mucus membranes or skin necrosis: No Has patient had a PCN reaction that required hospitalization No Has patient had a PCN reaction occurring within the last 10 years: Yes If all of the above answers are "NO", then may proceed with Cephalosporin use.    Social History   Social History  . Marital status: Divorced    Spouse name: N/A  . Number of children: N/A  . Years of education: N/A   Occupational History  . Not on file.   Social History Main Topics  . Smoking status: Former Smoker    Packs/day: 1.50    Years: 45.00    Types: Cigarettes    Quit date: 04/29/2015  . Smokeless tobacco: Never Used     Comment: Quit 2 months ago  . Alcohol use No  . Drug use: Unknown  . Sexual activity: Not on file   Other Topics Concern  . Not on file   Social History Narrative  . No narrative on file    Family History  Problem Relation Age  of Onset  . Heart disease Mother   . Breast cancer Sister     PHYSICAL EXAM: Vitals:   10/25/15 0825 10/25/15 0927  BP: 111/73 105/70  Pulse:    Resp:  Temp:       Intake/Output Summary (Last 24 hours) at 10/25/15 1117 Last data filed at 10/25/15 1036  Gross per 24 hour  Intake              717 ml  Output              500 ml  Net              217 ml    General:  Well appearing. No respiratory difficulty HEENT: normal Neck: supple. no JVD. Carotids 2+ bilat; no bruits. No lymphadenopathy or thryomegaly appreciated. Cor: PMI nondisplaced. Regular rate & rhythm. No rubs, gallops or murmurs. Lungs: clear Abdomen: soft, tender across upper abdomen especially in RUQ. Good bowel sounds. Extremities: no cyanosis, clubbing, rash, edema Neuro: alert & oriented x 3, cranial nerves grossly intact. moves all 4 extremities w/o difficulty. Affect pleasant.  ECG: pending  Results for orders placed or performed during the hospital encounter of 10/24/15 (from the past 24 hour(s))  TSH     Status: None   Collection Time: 10/24/15 11:34 AM  Result Value Ref Range   TSH 2.383 0.350 - 4.500 uIU/mL  Troponin I     Status: None   Collection Time: 10/24/15 11:34 AM  Result Value Ref Range   Troponin I <0.03 <0.03 ng/mL  Hemoglobin A1c     Status: Abnormal   Collection Time: 10/24/15 11:34 AM  Result Value Ref Range   Hgb A1c MFr Bld 6.5 (H) 4.0 - 6.0 %  Glucose, capillary     Status: None   Collection Time: 10/24/15 12:17 PM  Result Value Ref Range   Glucose-Capillary 68 65 - 99 mg/dL  Lactate dehydrogenase (CSF, pleural or peritoneal fluid)     Status: Abnormal   Collection Time: 10/24/15  2:45 PM  Result Value Ref Range   LD, Fluid 2,575 (H) 3 - 23 U/L   Fluid Type-FLDH CYTOPLEU   Body fluid cell count with differential     Status: Abnormal   Collection Time: 10/24/15  2:45 PM  Result Value Ref Range   Fluid Type-FCT PLEURAL    Color, Fluid BROWN (A) YELLOW   Appearance, Fluid  HAZY (A) CLEAR   WBC, Fluid 5,977 cu mm   Neutrophil Count, Fluid 62 %   Lymphs, Fluid 20 %   Monocyte-Macrophage-Serous Fluid 18 %   Eos, Fluid 0 %  Body fluid culture     Status: None (Preliminary result)   Collection Time: 10/24/15  2:45 PM  Result Value Ref Range   Specimen Description PLEURAL    Special Requests NONE    Gram Stain      RARE WBC PRESENT,BOTH PMN AND MONONUCLEAR NO ORGANISMS SEEN Performed at [facility]    Culture PENDING    Report Status PENDING   Glucose, pleural or peritoneal fluid     Status: None   Collection Time: 10/24/15  2:45 PM  Result Value Ref Range   Glucose, Fluid <20 mg/dL   Fluid Type-FGLU PLEURAL   Troponin I     Status: None   Collection Time: 10/24/15  3:59 PM  Result Value Ref Range   Troponin I <0.03 <0.03 ng/mL  Glucose, capillary     Status: Abnormal   Collection Time: 10/24/15  4:54 PM  Result Value Ref Range   Glucose-Capillary 108 (H) 65 - 99 mg/dL  Glucose, capillary     Status: Abnormal   Collection Time: 10/24/15  8:46 PM  Result Value Ref Range   Glucose-Capillary 124 (H) 65 - 99 mg/dL  Troponin I     Status: None   Collection Time: 10/24/15 11:11 PM  Result Value Ref Range   Troponin I <0.03 <0.03 ng/mL  Glucose, capillary     Status: Abnormal   Collection Time: 10/25/15  7:24 AM  Result Value Ref Range   Glucose-Capillary 121 (H) 65 - 99 mg/dL   Dg Chest 1 View  Result Date: 10/24/2015 CLINICAL DATA:  59 year old female status post left thoracentesis. EXAM: CHEST 1 VIEW COMPARISON:  Preoperative chest x-ray 10/24/2015 at 4:39 a.m. FINDINGS: Stable position of left subclavian approach single-lumen power injectable port catheter with the catheter tip overlying the mid SVC. Stable extensive left pleural thickening and loculated left lower lobe pleural effusion. No evidence of pneumothorax. Persistent volume loss in the left hemi thorax. The cardiac and mediastinal contours remain unchanged. Aortic atherosclerosis  is similar compared to prior. Slightly low inspiratory volumes with right basilar atelectasis. No acute osseous abnormality. Lytic lesion in the lateral aspect of the left seventh rib. IMPRESSION: No evidence of pneumothorax status post left thoracentesis. Electronically Signed   By: Jacqulynn Cadet M.D.   On: 10/24/2015 17:11   Dg Chest 2 View  Result Date: 10/24/2015 CLINICAL DATA:  Severe LEFT lower back pain beginning last week, with vomiting and diaphoresis. History of lung cancer, on chemotherapy. EXAM: CHEST  2 VIEW COMPARISON:  Chest radiograph Aug 12, 2015 FINDINGS: Large LEFT pleural effusion, increased from prior examination with underlying consolidation/mass. RIGHT lung is clear. The cardiac silhouette is mildly enlarged. Calcified aortic knob. Single lumen LEFT chest port with distal tip projecting in proximal superior vena cava. No pneumothorax. Soft tissue planes included osseous structures are nonsuspicious. IMPRESSION: Large LEFT pleural effusion with underlying consolidation/mass. Electronically Signed   By: Elon Alas M.D.   On: 10/24/2015 05:05   US Thoracentesis Asp Pleural Space W/img Guide  Result Date: 10/24/2015 INDICATION: 60 year old female with history of left lung cancer and persistent loculated left pleural effusion. EXAM: ULTRASOUND GUIDED LEFT THORACENTESIS MEDICATIONS: None. COMPLICATIONS: None immediate. PROCEDURE: An ultrasound guided thoracentesis was thoroughly discussed with the patient and questions answered. The benefits, risks, alternatives and complications were also discussed. The patient understands and wishes to proceed with the procedure. Written consent was obtained. Ultrasound was performed to localize and mark an adequate pocket of fluid in the left chest. The area was then prepped and draped in the normal sterile fashion. 1% Lidocaine was used for local anesthesia. Under ultrasound guidance a 19 gauge, 7-cm, Yueh catheter was introduced. Thoracentesis  was performed. The catheter was removed and a dressing applied. FINDINGS: A total of approximately 70 mL of reddish brown pleural fluid was removed. Samples were sent to the laboratory as requested by the clinical team. IMPRESSION: Successful ultrasound guided left thoracentesis yielding 70 mL of pleural fluid. Of note, the left basilar pleural effusion is loculated and highly complex. Further aspiration is unlikely to yield significantly more fluid. Electronically Signed   By: Jacqulynn Cadet M.D.   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 Her initially troponin was mildly elevated and subsequent troponins have been negative. Her chest pain is most likely related to metastatis to the ribs. Pain control will be an issue. No further cardiac workup at this time. Follow up in the office.  Daune Perch, NP 10/25/2015 11:17 AM

## 2015-10-25 NOTE — Discharge Summary (Signed)
[facility] NAME: Charlene Williams    MR#:  [id number]  DATE OF BIRTH:  Aug 20, 1955  DATE OF ADMISSION:  10/24/2015 ADMITTING PHYSICIAN: Harrie Foreman, MD  DATE OF DISCHARGE: 10/25/2015  1:41 PM  PRIMARY CARE PHYSICIAN: HOLLAND, [place], NP    ADMISSION DIAGNOSIS:  Elevated troponin [R79.89] Metastatic lung cancer (metastasis from lung to other site), unspecified laterality ([place]) [C34.90] Chest pain, unspecified chest pain type [R07.9]  DISCHARGE DIAGNOSIS:  Active Problems:   Chest pain   SECONDARY DIAGNOSIS:   Past Medical History:  Diagnosis Date  . Aneurysm ([place])   . Chest pain, pleuritic   . COPD (chronic obstructive pulmonary disease) ([place])    on home oxygen  . Depression   . Diabetes mellitus without complication ([place])   . Family history of chronic pain 01/17/2015  . Hiatal hernia   . Hypercholesteremia   . Hypertension   . Illicit drug use   . Lung cancer ([place])    adenocarcinoma left lung  . Migraines   . Sleep apnea   . Spine disorder     HOSPITAL COURSE:   60 year old female with past medical history of metastatic lung cancer, recurrent pleural effusion, chronic pain, hypertension, hyperlipidemia, COPD, diabetes who presents to the hospital due to chest pain.  1. Chest pain-This was most likely musculoskeletal in nature related to her lung cancer with metastatic disease to the ribs. -She was observed on telemetry, her cardiac markers did not trend upwards. She was seen by cardiology who did not recommend any further acute intervention. -Patient was treated with phenol patch, IV morphine, oxycodone and her pain is improved and therefore she is now being discharged home.  2. Pleural effusion-patient on admission was noted to have a possible left large pleural effusion. She's had a left-sided pleurodesis in the past. She underwent a ultrasound guided thoracentesis of the left which only yielded about 70 cc  of fluid. This is likely malignant in nature. As per the ultrasound results she has a loculated effusion which did not require any further intervention. Her x-rays findings were not likely on effusion but a combination of scarring, pleurodesis.  - she does not having worsening hypoxemia and therefore is being discharged home.   3. Hx of DM - while in the hospital pt. Was on SSI and now being discharged on Metformin.    4. Depression - she will cont. Cymbalta  5. Chronic Pain - due to Malignancy and metastatic disease.  - she is being discharged on Fentanyl Patch, Percocet as needed.  Pt. Has been taken off the MS contin.   6. Hyperlipidemia - she will cont. Simvastatin.   7. Hx of Previous PE - cont. Eliquis.    8. Lung Cancer w/ metastatic disease - cont. Follow up with Oncology as outpatient.   DISCHARGE CONDITIONS:   Stable.   CONSULTS OBTAINED:  Treatment Team:  Dionisio David, MD  DRUG ALLERGIES:   Allergies  Allergen Reactions  . Ampicillin Hives and Other (See Comments)    Has patient had a PCN reaction causing immediate rash, facial/tongue/throat swelling, SOB or lightheadedness with hypotension: No Has patient had a PCN reaction causing severe rash involving mucus membranes or skin necrosis: No Has patient had a PCN reaction that required hospitalization No Has patient had a PCN reaction occurring within the last 10 years: Yes If all of the above answers are "NO", then may proceed with Cephalosporin use.  Marland Kitchen Penicillins  Hives and Other (See Comments)    Has patient had a PCN reaction causing immediate rash, facial/tongue/throat swelling, SOB or lightheadedness with hypotension: No Has patient had a PCN reaction causing severe rash involving mucus membranes or skin necrosis: No Has patient had a PCN reaction that required hospitalization No Has patient had a PCN reaction occurring within the last 10 years: Yes If all of the above answers are "NO", then may proceed  with Cephalosporin use.    DISCHARGE MEDICATIONS:     Medication List    STOP taking these medications   levofloxacin 500 MG tablet Commonly known as:  LEVAQUIN   morphine 30 MG 12 hr tablet Commonly known as:  MS CONTIN   oxyCODONE-acetaminophen 5-325 MG tablet Commonly known as:  ROXICET     TAKE these medications   albuterol 108 (90 Base) MCG/ACT inhaler Commonly known as:  PROVENTIL HFA;VENTOLIN HFA Inhale 2 puffs into the lungs every 6 (six) hours as needed for wheezing or shortness of breath.   alendronate 70 MG tablet Commonly known as:  FOSAMAX Take 70 mg by mouth once a week. Pt takes on Tuesday.   Take with a full glass of water on an empty stomach.   apixaban 5 MG Tabs tablet Commonly known as:  ELIQUIS Take 1 tablet (5 mg total) by mouth 2 (two) times daily.   cetirizine 10 MG tablet Commonly known as:  ZYRTEC Take 10 mg by mouth at bedtime.   CYMBALTA 30 MG capsule Generic drug:  DULoxetine Take 90 mg by mouth daily.   fentaNYL 50 MCG/HR Commonly known as:  DURAGESIC - dosed mcg/hr Place 1 patch (50 mcg total) onto the skin every 3 (three) days.   fluticasone 220 MCG/ACT inhaler Commonly known as:  FLOVENT HFA Inhale 1 puff into the lungs 2 (two) times daily.   folic acid 1 MG tablet Commonly known as:  FOLVITE Take 1 mg by mouth daily.   gabapentin 800 MG tablet Commonly known as:  NEURONTIN Take 800 mg by mouth 3 (three) times daily.   HYDROcodone-acetaminophen 5-325 MG tablet Commonly known as:  NORCO/VICODIN Take 1-2 tablets by mouth every 4 (four) hours as needed for moderate pain.   lidocaine-prilocaine cream Commonly known as:  EMLA Apply 1 application topically as needed (prior to accessing port).   magnesium oxide 400 (241.3 Mg) MG tablet Commonly known as:  MAG-OX Take 400 mg by mouth at bedtime.   metFORMIN 750 MG 24 hr tablet Commonly known as:  GLUCOPHAGE-XR Take 750 mg by mouth daily with breakfast.   metoprolol  tartrate 25 MG tablet Commonly known as:  LOPRESSOR Take 1 tablet (25 mg total) by mouth 2 (two) times daily.   mirtazapine 45 MG tablet Commonly known as:  REMERON Take 45 mg by mouth at bedtime.   omeprazole 20 MG capsule Commonly known as:  PRILOSEC Take 20 mg by mouth daily before breakfast.   ondansetron 8 MG tablet Commonly known as:  ZOFRAN Take 8 mg by mouth every 8 (eight) hours as needed for nausea or vomiting.   prochlorperazine 10 MG tablet Commonly known as:  COMPAZINE TAKE 1 TABLET ('10MG'$ ) BY MOUTH EVERY SIX HOURS AS NEEDED FOR NAUSEA OR VOMITING   promethazine 25 MG suppository Commonly known as:  PHENERGAN Place 1 suppository (25 mg total) rectally every 6 (six) hours as needed for nausea or vomiting.   simvastatin 20 MG tablet Commonly known as:  ZOCOR Take 20 mg by mouth at bedtime.  traZODone 150 MG tablet Commonly known as:  DESYREL Take 150 mg by mouth at bedtime.         DISCHARGE INSTRUCTIONS:   DIET:  Cardiac diet  DISCHARGE CONDITION:  Stable  ACTIVITY:  Activity as tolerated  OXYGEN:  Home Oxygen: Yes.     Oxygen Delivery: 1 liters/min via Patient connected to nasal cannula oxygen  DISCHARGE LOCATION:  Home with Home health Nursing.    If you experience worsening of your admission symptoms, develop shortness of breath, life threatening emergency, suicidal or homicidal thoughts you must seek medical attention immediately by calling 911 or calling your MD immediately  if symptoms less severe.  You Must read complete instructions/literature along with all the possible adverse reactions/side effects for all the Medicines you take and that have been prescribed to you. Take any new Medicines after you have completely understood and accpet all the possible adverse reactions/side effects.   Please note  You were cared for by a hospitalist during your hospital stay. If you have any questions about your discharge medications or the care  you received while you were in the hospital after you are discharged, you can call the unit and asked to speak with the hospitalist on call if the hospitalist that took care of you is not available. Once you are discharged, your primary care physician will handle any further medical issues. Please note that NO REFILLS for any discharge medications will be authorized once you are discharged, as it is imperative that you return to your primary care physician (or establish a relationship with a primary care physician if you do not have one) for your aftercare needs so that they can reassess your need for medications and monitor your lab values.     Today   Chest pain much improved. Shortness of breath improved. Status post ultrasound-guided thoracentesis with 70 cc of fluid removed which is likely exudative and malignant in nature.  VITAL SIGNS:  Blood pressure 117/70, pulse 96, temperature 98.7 F (37.1 C), temperature source Oral, resp. rate 18, height '5\' 3"'$  (1.6 m), weight 66.9 kg (147 lb 6.4 oz), SpO2 96 %.  I/O:   Intake/Output Summary (Last 24 hours) at 10/25/15 1542 Last data filed at 10/25/15 1224  Gross per 24 hour  Intake           2489.5 ml  Output              500 ml  Net           1989.5 ml    PHYSICAL EXAMINATION:  GENERAL:  60 y.o.-year-old patient lying in the bed with no acute distress.  EYES: Pupils equal, round, reactive to light and accommodation. No scleral icterus. Extraocular muscles intact.  HEENT: Head atraumatic, normocephalic. Oropharynx and nasopharynx clear.  NECK:  Supple, no jugular venous distention. No thyroid enlargement, no tenderness.  LUNGS: Poor A/e Left lower lung fields, no wheezing, rales,rhonchi. No use of accessory muscles of respiration.   CARDIOVASCULAR: S1, S2 normal. No murmurs, rubs, or gallops.  ABDOMEN: Soft, non-tender, non-distended. Bowel sounds present. No organomegaly or mass.  EXTREMITIES: No pedal edema, cyanosis, or clubbing.   NEUROLOGIC: Cranial nerves II through XII are intact. No focal motor or sensory defecits b/l.  PSYCHIATRIC: The patient is alert and oriented x 3. Good affect.  SKIN: No obvious rash, lesion, or ulcer.   DATA REVIEW:   CBC  Recent Labs Lab 10/24/15 0412  WBC 14.7*  HGB 11.5*  HCT 34.3*  PLT 279    Chemistries   Recent Labs Lab 10/24/15 0412  NA 133*  K 4.0  CL 96*  CO2 22  GLUCOSE 80  BUN 12  CREATININE 0.68  CALCIUM 9.8  AST 21  ALT 16  ALKPHOS 125  BILITOT 1.0    Cardiac Enzymes  Recent Labs Lab 10/24/15 2311  TROPONINI <0.03    Microbiology Results  Results for orders placed or performed during the hospital encounter of 10/24/15  Body fluid culture     Status: None (Preliminary result)   Collection Time: 10/24/15  2:45 PM  Result Value Ref Range Status   Specimen Description PLEURAL  Final   Special Requests NONE  Final   Gram Stain   Final    RARE WBC PRESENT,BOTH PMN AND MONONUCLEAR NO ORGANISMS SEEN    Culture   Final    NO GROWTH < 24 HOURS Performed at [facility]    Report Status PENDING  Incomplete    RADIOLOGY:  Dg Chest 1 View  Result Date: 10/24/2015 CLINICAL DATA:  60 year old female status post left thoracentesis. EXAM: CHEST 1 VIEW COMPARISON:  Preoperative chest x-ray 10/24/2015 at 4:39 a.m. FINDINGS: Stable position of left subclavian approach single-lumen power injectable port catheter with the catheter tip overlying the mid SVC. Stable extensive left pleural thickening and loculated left lower lobe pleural effusion. No evidence of pneumothorax. Persistent volume loss in the left hemi thorax. The cardiac and mediastinal contours remain unchanged. Aortic atherosclerosis is similar compared to prior. Slightly low inspiratory volumes with right basilar atelectasis. No acute osseous abnormality. Lytic lesion in the lateral aspect of the left seventh rib. IMPRESSION: No evidence of pneumothorax status post left thoracentesis.  Electronically Signed   By: Jacqulynn Cadet M.D.   On: 10/24/2015 17:11   Dg Chest 2 View  Result Date: 10/24/2015 CLINICAL DATA:  Severe LEFT lower back pain beginning last week, with vomiting and diaphoresis. History of lung cancer, on chemotherapy. EXAM: CHEST  2 VIEW COMPARISON:  Chest radiograph Aug 12, 2015 FINDINGS: Large LEFT pleural effusion, increased from prior examination with underlying consolidation/mass. RIGHT lung is clear. The cardiac silhouette is mildly enlarged. Calcified aortic knob. Single lumen LEFT chest port with distal tip projecting in proximal superior vena cava. No pneumothorax. Soft tissue planes included osseous structures are nonsuspicious. IMPRESSION: Large LEFT pleural effusion with underlying consolidation/mass. Electronically Signed   By: Elon Alas M.D.   On: 10/24/2015 05:05   US Thoracentesis Asp Pleural Space W/img Guide  Result Date: 10/24/2015 INDICATION: 59 year old female with history of left lung cancer and persistent loculated left pleural effusion. EXAM: ULTRASOUND GUIDED LEFT THORACENTESIS MEDICATIONS: None. COMPLICATIONS: None immediate. PROCEDURE: An ultrasound guided thoracentesis was thoroughly discussed with the patient and questions answered. The benefits, risks, alternatives and complications were also discussed. The patient understands and wishes to proceed with the procedure. Written consent was obtained. Ultrasound was performed to localize and mark an adequate pocket of fluid in the left chest. The area was then prepped and draped in the normal sterile fashion. 1% Lidocaine was used for local anesthesia. Under ultrasound guidance a 19 gauge, 7-cm, Yueh catheter was introduced. Thoracentesis was performed. The catheter was removed and a dressing applied. FINDINGS: A total of approximately 70 mL of reddish brown pleural fluid was removed. Samples were sent to the laboratory as requested by the clinical team. IMPRESSION: Successful ultrasound  guided left thoracentesis yielding 70 mL of pleural fluid. Of note, the left basilar pleural effusion  is loculated and highly complex. Further aspiration is unlikely to yield significantly more fluid. Electronically Signed   By: Jacqulynn Cadet M.D.   On: 10/24/2015 17:12      Management plans discussed with the patient, family and they are in agreement.  CODE STATUS:     Code Status Orders        Start     Ordered   10/24/15 1010  Full code  Continuous     10/24/15 1009    Code Status History    Date Active Date Inactive Code Status Order ID Comments User Context   08/10/2015  1:44 AM 08/12/2015  4:45 PM Full Code [id number]  Lance Coon, MD Inpatient   07/29/2015  5:56 PM 07/30/2015  4:42 PM Full Code [id number]  Gladstone Lighter, MD Inpatient   07/11/2015  5:25 PM 07/19/2015  6:21 PM Full Code [id number]  Henreitta Leber, MD Inpatient   06/25/2015  6:35 PM 06/27/2015  4:00 PM Full Code [id number]  Theodoro Grist, MD ED      TOTAL TIME TAKING CARE OF THIS PATIENT: 40 minutes.    Henreitta Leber M.D on 10/25/2015 at 3:42 PM  Between 7am to 6pm - Pager - [phone]  After 6pm go to [email or web address] - password [email or web address] [facility]  [facility]  Office  [phone]  CC: Primary care physician; Kasandra Knudsen, NP

## 2015-10-25 NOTE — Care Management (Signed)
patient for discharge today and would benefit from home health nurse follow up for skilled assessment/observation.  Agency preference is [facility].  Referral call and accepeted.  Current with PCP.  has home 02 that she wears "when I need it."  Portable tanks, large back up tand and concentrator.  Does not remember the name of the agency.  Patient also has medicaid pcs services coordinated by [employer].  She is currently being reassessed for increase in her hours.

## 2015-10-25 NOTE — Progress Notes (Signed)
[facility] CONSULT NOTE  Patient Care Team: Kasandra Knudsen, NP as PCP - General (Nurse Practitioner)  CHIEF COMPLAINTS/PURPOSE OF CONSULTATION: Metastatic lung cancer/chest pain   HISTORY OF PRESENTING ILLNESS:  Charlene Williams 60 y.o.  female with history of left-sided adenocarcinoma the lung metastatic- status post left-sided pleurodesis currently on first an therapy with Keytruda Carbo and Alimta status post 3 cycles is currently admitted to the hospital for worsening chest pain/shortness of breath. Patient had chronic pain of the left side since pleurodesis- which got worse over the last few days. Chronic cough. No hemoptysis. No nausea no vomiting.  Chest x-ray shows "large left-sided pleural effusion"; however ultrasound yielded only 70 mL. Cytology pending Appetite is good. No weight loss. No headaches. No diarrhea. Also noted to have back pain.   Patient was due for cycle #4 today.  ROS: A complete 10 point review of system is done which is negative except mentioned above in history of present illness  MEDICAL HISTORY:  Past Medical History:  Diagnosis Date  . Aneurysm ([place])   . Chest pain, pleuritic   . COPD (chronic obstructive pulmonary disease) ([place])    on home oxygen  . Depression   . Diabetes mellitus without complication ([place])   . Family history of chronic pain 01/17/2015  . Hiatal hernia   . Hypercholesteremia   . Hypertension   . Illicit drug use   . Lung cancer ([place])    adenocarcinoma left lung  . Migraines   . Sleep apnea   . Spine disorder     SURGICAL HISTORY: Past Surgical History:  Procedure Laterality Date  . ABDOMINAL HYSTERECTOMY    . EYE SURGERY    . HERNIA REPAIR    . PORTACATH PLACEMENT Left 07/14/2015   Procedure: INSERTION PORT-A-CATH;  Surgeon: Nestor Lewandowsky, MD;  Location: [facility] ORS;  Service: General;  Laterality: Left;  . REMOVAL OF PLEURAL DRAINAGE CATHETER N/A 07/18/2015   Procedure: REMOVAL OF PLEURAL DRAINAGE  CATHETER;  Surgeon: Nestor Lewandowsky, MD;  Location: [facility] ORS;  Service: Thoracic;  Laterality: N/A;  . VIDEO ASSISTED THORACOSCOPY (VATS)/THOROCOTOMY Left 07/14/2015   Procedure: VIDEO ASSISTED THORACOSCOPY (VATS), pleural biopsy;  Surgeon: Nestor Lewandowsky, MD;  Location: [facility] ORS;  Service: General;  Laterality: Left;    SOCIAL HISTORY: Social History   Social History  . Marital status: Divorced    Spouse name: N/A  . Number of children: N/A  . Years of education: N/A   Occupational History  . Not on file.   Social History Main Topics  . Smoking status: Former Smoker    Packs/day: 1.50    Years: 45.00    Types: Cigarettes    Quit date: 04/29/2015  . Smokeless tobacco: Never Used     Comment: Quit 2 months ago  . Alcohol use No  . Drug use: Unknown  . Sexual activity: Not on file   Other Topics Concern  . Not on file   Social History Narrative  . No narrative on file    FAMILY HISTORY: Family History  Problem Relation Age of Onset  . Heart disease Mother   . Breast cancer Sister     ALLERGIES:  is allergic to ampicillin and penicillins.  MEDICATIONS:  Current Facility-Administered Medications  Medication Dose Route Frequency Provider Last Rate Last Dose  . acetaminophen (TYLENOL) tablet 650 mg  650 mg Oral Q6H PRN Harrie Foreman, MD       Or  . acetaminophen (TYLENOL) suppository 650  mg  650 mg Rectal Q6H PRN Harrie Foreman, MD      . albuterol (PROVENTIL) (2.5 MG/3ML) 0.083% nebulizer solution 2.5 mg  2.5 mg Nebulization Q6H PRN Harrie Foreman, MD      . apixaban Arne [facility]) tablet 5 mg  5 mg Oral BID Harrie Foreman, MD   5 mg at 10/24/15 2154  . aspirin EC tablet 81 mg  81 mg Oral Daily Harrie Foreman, MD   81 mg at 10/24/15 1243  . budesonide (PULMICORT) nebulizer solution 0.5 mg  0.5 mg Nebulization BID Harrie Foreman, MD   0.5 mg at 10/25/15 0730  . dextrose 5 %-0.9 % sodium chloride infusion   Intravenous Continuous Harrie Foreman, MD 75 mL/hr at  10/24/15 2327    . docusate sodium (COLACE) capsule 100 mg  100 mg Oral BID Harrie Foreman, MD   100 mg at 10/24/15 2154  . DULoxetine (CYMBALTA) DR capsule 90 mg  90 mg Oral Daily Harrie Foreman, MD   90 mg at 10/24/15 1242  . folic acid (FOLVITE) tablet 1 mg  1 mg Oral Daily Harrie Foreman, MD   1 mg at 10/24/15 1244  . gabapentin (NEURONTIN) capsule 800 mg  800 mg Oral TID Harrie Foreman, MD   800 mg at 10/24/15 2154  . HYDROcodone-acetaminophen (NORCO/VICODIN) 5-325 MG per tablet 1-2 tablet  1-2 tablet Oral Q4H PRN Henreitta Leber, MD   2 tablet at 10/24/15 2014  . insulin aspart (novoLOG) injection 0-5 Units  0-5 Units Subcutaneous QHS Harrie Foreman, MD      . insulin aspart (novoLOG) injection 0-9 Units  0-9 Units Subcutaneous TID WC Harrie Foreman, MD      . levofloxacin [facility]) tablet 500 mg  500 mg Oral Daily Harrie Foreman, MD   500 mg at 10/24/15 1243  . lidocaine-prilocaine (EMLA) cream 1 application  1 application Topical PRN Harrie Foreman, MD      . loratadine (CLARITIN) tablet 10 mg  10 mg Oral Daily Harrie Foreman, MD   10 mg at 10/24/15 1244  . magnesium oxide (MAG-OX) tablet 400 mg  400 mg Oral QHS Harrie Foreman, MD   400 mg at 10/24/15 2154  . metoprolol tartrate (LOPRESSOR) tablet 25 mg  25 mg Oral BID Harrie Foreman, MD   25 mg at 10/24/15 2154  . mirtazapine (REMERON) tablet 45 mg  45 mg Oral QHS Harrie Foreman, MD   45 mg at 10/24/15 2326  . morphine (MS CONTIN) 12 hr tablet 30 mg  30 mg Oral Q12H Harrie Foreman, MD   30 mg at 10/24/15 2154  . nitroGLYCERIN (NITROSTAT) SL tablet 0.4 mg  0.4 mg Sublingual Q5 min PRN Harrie Foreman, MD      . ondansetron [facility]) tablet 8 mg  8 mg Oral Q8H PRN Harrie Foreman, MD   8 mg at 10/24/15 2326  . pantoprazole (PROTONIX) EC tablet 40 mg  40 mg Oral Daily Harrie Foreman, MD   40 mg at 10/24/15 1244  . prochlorperazine (COMPAZINE) injection 10 mg  10 mg Intravenous Q6H PRN Harrie Foreman, MD      . promethazine [facility]) suppository 25 mg  25 mg Rectal Q6H PRN Harrie Foreman, MD      . simvastatin (ZOCOR) tablet 20 mg  20 mg Oral QHS Harrie Foreman, MD   20 mg at 10/24/15 2159  .  sodium chloride flush (NS) 0.9 % injection 3 mL  3 mL Intravenous Q12H Harrie Foreman, MD   3 mL at 10/24/15 1245  . traZODone (DESYREL) tablet 150 mg  150 mg Oral QHS Harrie Foreman, MD   150 mg at 10/24/15 1839      .  PHYSICAL EXAMINATION:  Vitals:   10/24/15 2154 10/25/15 0455  BP: 110/63 127/71  Pulse: 79 93  Resp:  18  Temp:  98.2 F (36.8 C)   Filed Weights   10/24/15 0357 10/24/15 1154 10/25/15 0455  Weight: 160 lb (72.6 kg) 147 lb 9.6 oz (67 kg) 147 lb 6.4 oz (66.9 kg)    GENERAL: Well-nourished well-developed; Alert, no distress and comfortable.   Accompanied by daughter. EYES: no pallor or icterus OROPHARYNX: no thrush or ulceration. NECK: supple, no masses felt LYMPH:  no palpable lymphadenopathy in the cervical, axillary or inguinal regions LUNGS: decreased breath sounds to auscultation at bases and  No wheeze or crackles HEART/CVS: regular rate & rhythm and no murmurs; No lower extremity edema ABDOMEN: abdomen soft, non-tender and normal bowel sounds Musculoskeletal:no cyanosis of digits and no clubbing  PSYCH: alert & oriented x 3 with fluent speech NEURO: no focal motor/sensory deficits SKIN:  no rashes or significant lesions  LABORATORY DATA:  I have reviewed the data as listed Lab Results  Component Value Date   WBC 14.7 (H) 10/24/2015   HGB 11.5 (L) 10/24/2015   HCT 34.3 (L) 10/24/2015   MCV 85.9 10/24/2015   PLT 279 10/24/2015    Recent Labs  09/13/15 0840 10/04/15 0830 10/15/15 1227 10/24/15 0412  NA 134* 135 137 133*  K 4.4 4.4 3.9 4.0  CL 101 103 105 96*  CO2 '26 26 26 22  '$ GLUCOSE 123* 105* 84 80  BUN 24* '14 9 12  '$ CREATININE 1.08* 0.94 0.67 0.68  CALCIUM 9.8 9.5 9.5 9.8  GFRNONAA 54* >60 >60 >60  GFRAA >60 >60 >60 >60   PROT 7.4 7.9  --  8.2*  ALBUMIN 3.7 3.5  --  3.6  AST 22 19  --  21  ALT 20 15  --  16  ALKPHOS 119 130*  --  125  BILITOT 0.2* 0.3  --  1.0    RADIOGRAPHIC STUDIES: I have personally reviewed the radiological images as listed and agreed with the findings in the report. Dg Chest 1 View  Result Date: 10/24/2015 CLINICAL DATA:  60 year old female status post left thoracentesis. EXAM: CHEST 1 VIEW COMPARISON:  Preoperative chest x-ray 10/24/2015 at 4:39 a.m. FINDINGS: Stable position of left subclavian approach single-lumen power injectable port catheter with the catheter tip overlying the mid SVC. Stable extensive left pleural thickening and loculated left lower lobe pleural effusion. No evidence of pneumothorax. Persistent volume loss in the left hemi thorax. The cardiac and mediastinal contours remain unchanged. Aortic atherosclerosis is similar compared to prior. Slightly low inspiratory volumes with right basilar atelectasis. No acute osseous abnormality. Lytic lesion in the lateral aspect of the left seventh rib. IMPRESSION: No evidence of pneumothorax status post left thoracentesis. Electronically Signed   By: Jacqulynn Cadet M.D.   On: 10/24/2015 17:11   Dg Chest 2 View  Result Date: 10/24/2015 CLINICAL DATA:  Severe LEFT lower back pain beginning last week, with vomiting and diaphoresis. History of lung cancer, on chemotherapy. EXAM: CHEST  2 VIEW COMPARISON:  Chest radiograph Aug 12, 2015 FINDINGS: Large LEFT pleural effusion, increased from prior examination with underlying consolidation/mass. RIGHT  lung is clear. The cardiac silhouette is mildly enlarged. Calcified aortic knob. Single lumen LEFT chest port with distal tip projecting in proximal superior vena cava. No pneumothorax. Soft tissue planes included osseous structures are nonsuspicious. IMPRESSION: Large LEFT pleural effusion with underlying consolidation/mass. Electronically Signed   By: Elon Alas M.D.   On: 10/24/2015  05:05   Dg Lumbar Spine 2-3 Views  Result Date: 10/15/2015 CLINICAL DATA:  Low back pain EXAM: LUMBAR SPINE - 2-3 VIEW COMPARISON:  None. FINDINGS: Five lumbar type vertebral bodies. Normal lumbar lordosis. No evidence of fracture or dislocation. Vertebral body heights are maintained. Mild multilevel degenerative changes. Visualized bony pelvis appears intact. Tubal ligation clips bilaterally. IMPRESSION: No fracture or dislocation is seen. Mild degenerative changes. Electronically Signed   By: Julian Hy M.D.   On: 10/15/2015 12:08  Ct Chest W Contrast  Result Date: 10/05/2015 CLINICAL DATA:  Primary cancer of left lung metastatic to other site, pt states LL ca diagnosed this spring 2017, left arm swelling and pain since LL surgery 3+mos ago, denies any other new problems/pain. Currently taking chemo. No radiation therapy. No recent trauma. ^ EXAM: CT CHEST WITH CONTRAST TECHNIQUE: Multidetector CT imaging of the chest was performed during intravenous contrast administration. CONTRAST:  46m ISOVUE-300 IOPAMIDOL (ISOVUE-300) INJECTION 61% COMPARISON:  CT 5 6,017 FINDINGS: Cardiovascular: Coronary artery calcification and aortic atherosclerotic calcification. Mediastinum/Nodes: Lymph node anterior to the main pulmonary artery measuring 11 mm (image 24, series 2). Lungs/Pleura: Rounded mass in the LEFT lower lobe measures 4.6 x 4.6 cm compared to 4.6 x 4.7 cm. Loculated fluid in the LEFT lower lobe is similar prior. Nodule in the posterior aspect of the LEFT upper lobe measuring 10 mm is decreased from 12 mm (image 45, series 3). In the RIGHT lung, a small nodule with a tiny central lucency measures 5 mm in the azygos esophageal recess (image 20 14, series 3) is not changed. Upper Abdomen: Limited view of the liver, kidneys, pancreas are unremarkable. Normal adrenal glands. Musculoskeletal: Some contraction of lytic lesion in the posterior RIGHT fifth rib measuring 17 mm decreased from 21 mm. Insert  new from prior (sagittal image 83) is new from prior. IMPRESSION: 1.  LEFT lower lobe mass is not changed significantly in size. 2. Potential new mediastinal lymph node anterior to the main pulmonary artery. 3. New lytic lesion in the T8 vertebral body is concerning for skeletal metastasis. Consider FDG PET-CT scan for restaging. Electronically Signed   By: SSuzy BouchardM.D.   On: 10/05/2015 12:07   [place]Venous Img Upper Uni Left  Result Date: 10/05/2015 CLINICAL DATA:  Left upper extremity swelling for several months. EXAM: Left UPPER EXTREMITY VENOUS DOPPLER ULTRASOUND TECHNIQUE: Gray-scale sonography with graded compression, as well as color Doppler and duplex ultrasound were performed to evaluate the upper extremity deep venous system from the level of the subclavian vein and including the jugular, axillary, basilic, radial, ulnar and upper cephalic vein. Spectral Doppler was utilized to evaluate flow at rest and with distal augmentation maneuvers. COMPARISON:  None. FINDINGS: Contralateral Subclavian Vein: Respiratory phasicity is normal and symmetric with the symptomatic side. No evidence of thrombus. Normal compressibility. Internal Jugular Vein: No evidence of thrombus. Normal compressibility, respiratory phasicity and response to augmentation. Subclavian Vein: No evidence of thrombus. Normal compressibility, respiratory phasicity and response to augmentation. Axillary Vein: No evidence of thrombus. Normal compressibility, respiratory phasicity and response to augmentation. Cephalic Vein: No evidence of thrombus. Normal compressibility, respiratory phasicity and response to  augmentation. Basilic Vein: No evidence of thrombus. Normal compressibility, respiratory phasicity and response to augmentation. Brachial Veins: No evidence of thrombus. Normal compressibility, respiratory phasicity and response to augmentation. Radial Veins: No evidence of thrombus. Normal compressibility, respiratory phasicity and  response to augmentation. Ulnar Veins: No evidence of thrombus. Normal compressibility, respiratory phasicity and response to augmentation. Venous Reflux:  None visualized. Other Findings:  None visualized. IMPRESSION: No evidence of venous thrombosis seen in left upper extremity. Electronically Signed   By: Marijo Conception, M.D.   On: 10/05/2015 11:49   US Thoracentesis Asp Pleural Space W/img Guide  Result Date: 10/24/2015 INDICATION: 60 year old female with history of left lung cancer and persistent loculated left pleural effusion. EXAM: ULTRASOUND GUIDED LEFT THORACENTESIS MEDICATIONS: None. COMPLICATIONS: None immediate. PROCEDURE: An ultrasound guided thoracentesis was thoroughly discussed with the patient and questions answered. The benefits, risks, alternatives and complications were also discussed. The patient understands and wishes to proceed with the procedure. Written consent was obtained. Ultrasound was performed to localize and mark an adequate pocket of fluid in the left chest. The area was then prepped and draped in the normal sterile fashion. 1% Lidocaine was used for local anesthesia. Under ultrasound guidance a 19 gauge, 7-cm, Yueh catheter was introduced. Thoracentesis was performed. The catheter was removed and a dressing applied. FINDINGS: A total of approximately 70 mL of reddish brown pleural fluid was removed. Samples were sent to the laboratory as requested by the clinical team. IMPRESSION: Successful ultrasound guided left thoracentesis yielding 70 mL of pleural fluid. Of note, the left basilar pleural effusion is loculated and highly complex. Further aspiration is unlikely to yield significantly more fluid. Electronically Signed   By: Jacqulynn Cadet M.D.   On: 10/24/2015 17:12    ASSESSMENT & PLAN:   # Metastatic adenocarcinoma the lung currently status post 3 cycles of carboplatin and Alimta Keytruda- is currently admitted to the hospital for chest pain/shortness of  breath.  # Metastatic adenocarcinoma the lung- post-3 cycles- CT scan mixing response [lytic lesion in L1; however stable left lower lobe lung mass; clinically improved right arm mass]- with no improvement in her pain. As per the recommendations of radiology- we will get PET scan for further evaluation. I again had a long discussion the patient regarding the palliative nature of the treatments. And further treatments/prognosis will depend upon- results of the PET scan.  # Left-sided "pleural effusion"- based on chest x-ray; ultrasound- loculated effusion- yielding- 70 mL. Await for cytology  # Continued "chest pains"/back pain- await the results of the PET scan. For now switch the patient to fentanyl patch 50 g; and continue short-acting pain medication.  # Patient will follow with me next week after the PET scan/ 4 initiation of chemotherapy.  The above plan of care was discussed with Dr.  Verdell Carmine.   Thank you Dr. Verdell Carmine for allowing me to participate in the care of your pleasant patient. Please do not hesitate to contact me with questions or concerns in the interim.  All questions were answered. The patient knows to call the clinic with any problems, questions or concerns.   I reviewed the images myself and with the patient and family in detail.      Cammie Sickle, MD 10/25/2015 8:13 AM

## 2015-10-25 NOTE — Progress Notes (Signed)
Spoke with dr. Verdell Carmine regarding patients blood pressure 105/70 hr 90's and scheduled metoprolol. Per md hold am dose of metoprolol will continue to monitor

## 2015-10-25 NOTE — Progress Notes (Signed)
Discharge instructions along with home medication list and follow up gone over with patient and family. Both verbalized that they understood instruction. Printed prescriptions given to patient x2. Fentanyl patch intake, iv removed and telemetry removed. Patient to be discharge home with home health. Daughter at bedside to transport patient

## 2015-10-26 LAB — CYTOLOGY - NON PAP

## 2015-10-28 LAB — BODY FLUID CULTURE: Culture: NO GROWTH

## 2015-10-31 ENCOUNTER — Ambulatory Visit: Admit: 2015-10-31 | Payer: Medicare Other

## 2015-10-31 ENCOUNTER — Emergency Department: Payer: Medicare Other

## 2015-10-31 ENCOUNTER — Inpatient Hospital Stay
Admission: EM | Admit: 2015-10-31 | Discharge: 2015-11-02 | DRG: 948 | Disposition: A | Discharge status: Home Health | Payer: Medicare Other | Attending: Internal Medicine | Admitting: Internal Medicine

## 2015-10-31 ENCOUNTER — Telehealth: Payer: Self-pay | Admitting: *Deleted

## 2015-10-31 ENCOUNTER — Encounter: Payer: Self-pay | Admitting: Emergency Medicine

## 2015-10-31 DIAGNOSIS — Z9981 Dependence on supplemental oxygen: Secondary | ICD-10-CM | POA: Diagnosis not present

## 2015-10-31 DIAGNOSIS — J449 Chronic obstructive pulmonary disease, unspecified: Secondary | ICD-10-CM | POA: Diagnosis present

## 2015-10-31 DIAGNOSIS — Z7983 Long term (current) use of bisphosphonates: Secondary | ICD-10-CM

## 2015-10-31 DIAGNOSIS — E785 Hyperlipidemia, unspecified: Secondary | ICD-10-CM | POA: Diagnosis present

## 2015-10-31 DIAGNOSIS — Z7984 Long term (current) use of oral hypoglycemic drugs: Secondary | ICD-10-CM

## 2015-10-31 DIAGNOSIS — Z7982 Long term (current) use of aspirin: Secondary | ICD-10-CM | POA: Diagnosis not present

## 2015-10-31 DIAGNOSIS — C799 Secondary malignant neoplasm of unspecified site: Secondary | ICD-10-CM

## 2015-10-31 DIAGNOSIS — C3432 Malignant neoplasm of lower lobe, left bronchus or lung: Secondary | ICD-10-CM | POA: Diagnosis not present

## 2015-10-31 DIAGNOSIS — Z87891 Personal history of nicotine dependence: Secondary | ICD-10-CM

## 2015-10-31 DIAGNOSIS — Z79899 Other long term (current) drug therapy: Secondary | ICD-10-CM

## 2015-10-31 DIAGNOSIS — C801 Malignant (primary) neoplasm, unspecified: Secondary | ICD-10-CM | POA: Diagnosis present

## 2015-10-31 DIAGNOSIS — C3492 Malignant neoplasm of unspecified part of left bronchus or lung: Secondary | ICD-10-CM | POA: Diagnosis not present

## 2015-10-31 DIAGNOSIS — T40605A Adverse effect of unspecified narcotics, initial encounter: Secondary | ICD-10-CM | POA: Diagnosis present

## 2015-10-31 DIAGNOSIS — Z803 Family history of malignant neoplasm of breast: Secondary | ICD-10-CM | POA: Diagnosis not present

## 2015-10-31 DIAGNOSIS — Z880 Allergy status to penicillin: Secondary | ICD-10-CM | POA: Diagnosis not present

## 2015-10-31 DIAGNOSIS — G629 Polyneuropathy, unspecified: Secondary | ICD-10-CM | POA: Diagnosis present

## 2015-10-31 DIAGNOSIS — Z79891 Long term (current) use of opiate analgesic: Secondary | ICD-10-CM

## 2015-10-31 DIAGNOSIS — Z86711 Personal history of pulmonary embolism: Secondary | ICD-10-CM

## 2015-10-31 DIAGNOSIS — Z8249 Family history of ischemic heart disease and other diseases of the circulatory system: Secondary | ICD-10-CM

## 2015-10-31 DIAGNOSIS — I10 Essential (primary) hypertension: Secondary | ICD-10-CM | POA: Diagnosis present

## 2015-10-31 DIAGNOSIS — E119 Type 2 diabetes mellitus without complications: Secondary | ICD-10-CM | POA: Diagnosis present

## 2015-10-31 DIAGNOSIS — Z7901 Long term (current) use of anticoagulants: Secondary | ICD-10-CM

## 2015-10-31 DIAGNOSIS — G939 Disorder of brain, unspecified: Secondary | ICD-10-CM | POA: Diagnosis not present

## 2015-10-31 DIAGNOSIS — C7951 Secondary malignant neoplasm of bone: Secondary | ICD-10-CM | POA: Diagnosis present

## 2015-10-31 DIAGNOSIS — G894 Chronic pain syndrome: Secondary | ICD-10-CM

## 2015-10-31 DIAGNOSIS — F329 Major depressive disorder, single episode, unspecified: Secondary | ICD-10-CM | POA: Diagnosis present

## 2015-10-31 DIAGNOSIS — G4730 Sleep apnea, unspecified: Secondary | ICD-10-CM | POA: Diagnosis present

## 2015-10-31 DIAGNOSIS — G893 Neoplasm related pain (acute) (chronic): Principal | ICD-10-CM | POA: Diagnosis present

## 2015-10-31 DIAGNOSIS — E7800 Pure hypercholesterolemia, unspecified: Secondary | ICD-10-CM | POA: Diagnosis present

## 2015-10-31 DIAGNOSIS — Z510 Encounter for antineoplastic radiation therapy: Secondary | ICD-10-CM | POA: Diagnosis present

## 2015-10-31 DIAGNOSIS — K5903 Drug induced constipation: Secondary | ICD-10-CM | POA: Diagnosis present

## 2015-10-31 DIAGNOSIS — C3490 Malignant neoplasm of unspecified part of unspecified bronchus or lung: Secondary | ICD-10-CM

## 2015-10-31 DIAGNOSIS — C7800 Secondary malignant neoplasm of unspecified lung: Secondary | ICD-10-CM | POA: Diagnosis present

## 2015-10-31 DIAGNOSIS — R52 Pain, unspecified: Secondary | ICD-10-CM

## 2015-10-31 LAB — CBC WITH DIFFERENTIAL/PLATELET
BASOS ABS: 0.1 10*3/uL (ref 0–0.1)
BASOS PCT: 0 %
EOS ABS: 0.1 10*3/uL (ref 0–0.7)
EOS PCT: 1 %
HEMATOCRIT: 34.5 % — AB (ref 35.0–47.0)
Hemoglobin: 11.8 g/dL — ABNORMAL LOW (ref 12.0–16.0)
Lymphocytes Relative: 8 %
Lymphs Abs: 1.6 10*3/uL (ref 1.0–3.6)
MCH: 28.8 pg (ref 26.0–34.0)
MCHC: 34.2 g/dL (ref 32.0–36.0)
MCV: 84 fL (ref 80.0–100.0)
MONO ABS: 2 10*3/uL — AB (ref 0.2–0.9)
Monocytes Relative: 11 %
NEUTROS ABS: 15.4 10*3/uL — AB (ref 1.4–6.5)
Neutrophils Relative %: 80 %
PLATELETS: 492 10*3/uL — AB (ref 150–440)
RBC: 4.11 MIL/uL (ref 3.80–5.20)
RDW: 22.1 % — AB (ref 11.5–14.5)
WBC: 19.2 10*3/uL — AB (ref 3.6–11.0)

## 2015-10-31 LAB — COMPREHENSIVE METABOLIC PANEL
ALT: 11 U/L — AB (ref 14–54)
AST: 16 U/L (ref 15–41)
Albumin: 3.2 g/dL — ABNORMAL LOW (ref 3.5–5.0)
Alkaline Phosphatase: 125 U/L (ref 38–126)
Anion gap: 10 (ref 5–15)
BUN: 11 mg/dL (ref 6–20)
CHLORIDE: 99 mmol/L — AB (ref 101–111)
CO2: 27 mmol/L (ref 22–32)
CREATININE: 0.54 mg/dL (ref 0.44–1.00)
Calcium: 10.2 mg/dL (ref 8.9–10.3)
GFR calc non Af Amer: >60 mL/min (ref >60)
Glucose, Bld: 116 mg/dL — ABNORMAL HIGH (ref 65–99)
Potassium: 3.3 mmol/L — ABNORMAL LOW (ref 3.5–5.1)
SODIUM: 136 mmol/L (ref 135–145)
Total Bilirubin: 0.5 mg/dL (ref 0.3–1.2)
Total Protein: 7.5 g/dL (ref 6.5–8.1)

## 2015-10-31 LAB — URINALYSIS COMPLETE WITH MICROSCOPIC (ARMC ONLY)
BILIRUBIN URINE: NEGATIVE
Bacteria, UA: NONE SEEN
GLUCOSE, UA: NEGATIVE mg/dL
HGB URINE DIPSTICK: NEGATIVE
KETONES UR: NEGATIVE mg/dL
LEUKOCYTES UA: NEGATIVE
NITRITE: NEGATIVE
PH: 6 (ref 5.0–8.0)
Protein, ur: NEGATIVE mg/dL
Specific Gravity, Urine: 1 — ABNORMAL LOW (ref 1.005–1.030)
Squamous Epithelial / LPF: NONE SEEN

## 2015-10-31 LAB — TROPONIN I: TROPONIN I: 0.03 ng/mL — AB (ref <0.03)

## 2015-10-31 LAB — LACTIC ACID, PLASMA: Lactic Acid, Venous: 0.8 mmol/L (ref 0.5–1.9)

## 2015-10-31 LAB — GLUCOSE, CAPILLARY: GLUCOSE-CAPILLARY: 120 mg/dL — AB (ref 65–99)

## 2015-10-31 MED ORDER — MAGNESIUM OXIDE 400 (241.3 MG) MG PO TABS
400.0000 mg | ORAL_TABLET | Freq: Every day | ORAL | Status: DC
  Administered 2015-10-31 – 2015-11-01 (×2): 400 mg via ORAL
  Filled 2015-10-31 (×2): qty 1

## 2015-10-31 MED ORDER — MIRTAZAPINE 15 MG PO TABS
45.0000 mg | ORAL_TABLET | Freq: Every day | ORAL | Status: DC
  Administered 2015-10-31 – 2015-11-01 (×2): 45 mg via ORAL
  Filled 2015-10-31 (×2): qty 3

## 2015-10-31 MED ORDER — GABAPENTIN 400 MG PO CAPS
800.0000 mg | ORAL_CAPSULE | Freq: Three times a day (TID) | ORAL | Status: DC
  Administered 2015-10-31 – 2015-11-02 (×6): 800 mg via ORAL
  Filled 2015-10-31 (×6): qty 2

## 2015-10-31 MED ORDER — HYDROMORPHONE HCL 1 MG/ML IJ SOLN
1.0000 mg | Freq: Once | INTRAMUSCULAR | Status: AC
  Administered 2015-10-31: 1 mg via INTRAVENOUS
  Filled 2015-10-31: qty 1

## 2015-10-31 MED ORDER — ASPIRIN EC 81 MG PO TBEC
81.0000 mg | DELAYED_RELEASE_TABLET | Freq: Every day | ORAL | Status: DC
  Administered 2015-11-01 – 2015-11-02 (×2): 81 mg via ORAL
  Filled 2015-10-31 (×2): qty 1

## 2015-10-31 MED ORDER — ONDANSETRON HCL 4 MG/2ML IJ SOLN
4.0000 mg | Freq: Once | INTRAMUSCULAR | Status: AC
  Administered 2015-10-31: 4 mg via INTRAVENOUS
  Filled 2015-10-31: qty 2

## 2015-10-31 MED ORDER — HYDROMORPHONE HCL 1 MG/ML IJ SOLN
1.0000 mg | Freq: Once | INTRAMUSCULAR | Status: AC
  Administered 2015-10-31: 1 mg via INTRAVENOUS

## 2015-10-31 MED ORDER — ACETAMINOPHEN 325 MG PO TABS: 650.0000 mg | ORAL_TABLET | Freq: Four times a day (QID) | ORAL | Status: DC | PRN

## 2015-10-31 MED ORDER — PROMETHAZINE HCL 25 MG RE SUPP
25.0000 mg | Freq: Four times a day (QID) | RECTAL | Status: DC | PRN
  Filled 2015-10-31: qty 1

## 2015-10-31 MED ORDER — ONDANSETRON HCL 4 MG/2ML IJ SOLN: 4.0000 mg | Freq: Four times a day (QID) | INTRAMUSCULAR | Status: DC | PRN

## 2015-10-31 MED ORDER — HYDROMORPHONE HCL 1 MG/ML IJ SOLN
INTRAMUSCULAR | Status: AC
  Administered 2015-10-31: 1 mg via INTRAVENOUS
  Filled 2015-10-31: qty 1

## 2015-10-31 MED ORDER — IOPAMIDOL (ISOVUE-370) INJECTION 76%
100.0000 mL | Freq: Once | INTRAVENOUS | Status: AC | PRN
  Administered 2015-10-31: 85 mL via INTRAVENOUS

## 2015-10-31 MED ORDER — FENTANYL 50 MCG/HR TD PT72
50.0000 ug | MEDICATED_PATCH | TRANSDERMAL | Status: DC
  Administered 2015-10-31: 50 ug via TRANSDERMAL
  Filled 2015-10-31: qty 1

## 2015-10-31 MED ORDER — LORATADINE 10 MG PO TABS
10.0000 mg | ORAL_TABLET | Freq: Every day | ORAL | Status: DC
  Administered 2015-11-01 – 2015-11-02 (×2): 10 mg via ORAL
  Filled 2015-10-31 (×2): qty 1

## 2015-10-31 MED ORDER — INSULIN ASPART 100 UNIT/ML ~~LOC~~ SOLN: 0.0000 [IU] | Freq: Every day | SUBCUTANEOUS | Status: DC

## 2015-10-31 MED ORDER — SIMVASTATIN 20 MG PO TABS
20.0000 mg | ORAL_TABLET | Freq: Every day | ORAL | Status: DC
  Administered 2015-10-31 – 2015-11-01 (×2): 20 mg via ORAL
  Filled 2015-10-31 (×2): qty 1

## 2015-10-31 MED ORDER — LABETALOL HCL 5 MG/ML IV SOLN
INTRAVENOUS | Status: AC
  Filled 2015-10-31: qty 4

## 2015-10-31 MED ORDER — TRAZODONE HCL 50 MG PO TABS
150.0000 mg | ORAL_TABLET | Freq: Every day | ORAL | Status: DC
  Administered 2015-10-31 – 2015-11-01 (×2): 150 mg via ORAL
  Filled 2015-10-31 (×2): qty 1

## 2015-10-31 MED ORDER — HYDROMORPHONE HCL 1 MG/ML IJ SOLN
1.0000 mg | INTRAMUSCULAR | Status: DC | PRN
  Administered 2015-10-31 – 2015-11-01 (×7): 1 mg via INTRAVENOUS
  Filled 2015-10-31 (×7): qty 1

## 2015-10-31 MED ORDER — HYDROMORPHONE HCL 1 MG/ML IJ SOLN
INTRAMUSCULAR | Status: AC
  Filled 2015-10-31: qty 1

## 2015-10-31 MED ORDER — ALENDRONATE SODIUM 70 MG PO TABS: 70.0000 mg | ORAL_TABLET | ORAL | Status: DC

## 2015-10-31 MED ORDER — HYDROCODONE-ACETAMINOPHEN 5-325 MG PO TABS
1.0000 | ORAL_TABLET | ORAL | Status: DC | PRN
  Administered 2015-11-01 – 2015-11-02 (×8): 2 via ORAL
  Filled 2015-10-31 (×8): qty 2

## 2015-10-31 MED ORDER — ONDANSETRON HCL 4 MG PO TABS: 8.0000 mg | ORAL_TABLET | Freq: Three times a day (TID) | ORAL | Status: DC | PRN

## 2015-10-31 MED ORDER — PANTOPRAZOLE SODIUM 40 MG PO TBEC
40.0000 mg | DELAYED_RELEASE_TABLET | Freq: Every day | ORAL | Status: DC
  Administered 2015-11-01 – 2015-11-02 (×2): 40 mg via ORAL
  Filled 2015-10-31 (×2): qty 1

## 2015-10-31 MED ORDER — FENTANYL CITRATE (PF) 100 MCG/2ML IJ SOLN
INTRAMUSCULAR | Status: AC
  Administered 2015-10-31: 75 ug via INTRAVENOUS
  Filled 2015-10-31: qty 2

## 2015-10-31 MED ORDER — DIATRIZOATE MEGLUMINE & SODIUM 66-10 % PO SOLN
15.0000 mL | Freq: Once | ORAL | Status: AC
  Administered 2015-10-31: 15 mL via ORAL

## 2015-10-31 MED ORDER — FLUTICASONE PROPIONATE HFA 220 MCG/ACT IN AERO: 1.0000 | INHALATION_SPRAY | Freq: Two times a day (BID) | RESPIRATORY_TRACT | Status: DC

## 2015-10-31 MED ORDER — METOPROLOL TARTRATE 25 MG PO TABS
25.0000 mg | ORAL_TABLET | Freq: Two times a day (BID) | ORAL | Status: DC
  Administered 2015-10-31 – 2015-11-02 (×4): 25 mg via ORAL
  Filled 2015-10-31 (×5): qty 1

## 2015-10-31 MED ORDER — HYDROMORPHONE HCL 1 MG/ML IJ SOLN
1.0000 mg | Freq: Once | INTRAMUSCULAR | Status: AC
Start: 2015-10-31 — End: 2015-10-31
  Administered 2015-10-31: 1 mg via INTRAVENOUS

## 2015-10-31 MED ORDER — ONDANSETRON HCL 4 MG PO TABS
4.0000 mg | ORAL_TABLET | Freq: Four times a day (QID) | ORAL | Status: DC | PRN
  Administered 2015-11-02: 20:00:00 4 mg via ORAL
  Filled 2015-10-31: qty 1

## 2015-10-31 MED ORDER — ACETAMINOPHEN 650 MG RE SUPP: 650.0000 mg | Freq: Four times a day (QID) | RECTAL | Status: DC | PRN

## 2015-10-31 MED ORDER — DULOXETINE HCL 60 MG PO CPEP
90.0000 mg | ORAL_CAPSULE | Freq: Every day | ORAL | Status: DC
  Administered 2015-11-01 – 2015-11-02 (×2): 90 mg via ORAL
  Filled 2015-10-31 (×2): qty 1

## 2015-10-31 MED ORDER — ALBUTEROL SULFATE (2.5 MG/3ML) 0.083% IN NEBU: 2.5000 mg | INHALATION_SOLUTION | Freq: Four times a day (QID) | RESPIRATORY_TRACT | Status: DC | PRN

## 2015-10-31 MED ORDER — LABETALOL HCL 5 MG/ML IV SOLN
10.0000 mg | Freq: Once | INTRAVENOUS | Status: AC
  Administered 2015-10-31: 10 mg via INTRAVENOUS

## 2015-10-31 MED ORDER — FOLIC ACID 1 MG PO TABS
1.0000 mg | ORAL_TABLET | Freq: Every day | ORAL | Status: DC
  Administered 2015-11-01 – 2015-11-02 (×2): 1 mg via ORAL
  Filled 2015-10-31 (×2): qty 1

## 2015-10-31 MED ORDER — SODIUM CHLORIDE 0.9 % IV BOLUS (SEPSIS)
1000.0000 mL | Freq: Once | INTRAVENOUS | Status: AC
  Administered 2015-10-31: 1000 mL via INTRAVENOUS

## 2015-10-31 MED ORDER — FENTANYL CITRATE (PF) 100 MCG/2ML IJ SOLN
75.0000 ug | Freq: Once | INTRAMUSCULAR | Status: AC
  Administered 2015-10-31: 75 ug via INTRAVENOUS

## 2015-10-31 MED ORDER — INSULIN ASPART 100 UNIT/ML ~~LOC~~ SOLN
0.0000 [IU] | Freq: Three times a day (TID) | SUBCUTANEOUS | Status: DC
  Administered 2015-11-01: 1 [IU] via SUBCUTANEOUS
  Filled 2015-10-31: qty 1

## 2015-10-31 MED ORDER — BUDESONIDE 0.25 MG/2ML IN SUSP
0.2500 mg | Freq: Two times a day (BID) | RESPIRATORY_TRACT | Status: DC
  Administered 2015-10-31 – 2015-11-02 (×5): 0.25 mg via RESPIRATORY_TRACT
  Filled 2015-10-31 (×5): qty 2

## 2015-10-31 MED ORDER — APIXABAN 5 MG PO TABS
5.0000 mg | ORAL_TABLET | Freq: Two times a day (BID) | ORAL | Status: DC
  Administered 2015-10-31 – 2015-11-02 (×4): 5 mg via ORAL
  Filled 2015-10-31 (×4): qty 1

## 2015-10-31 NOTE — ED Notes (Signed)
Attempted to call report, Merry Proud RN refused patient r/t BP and HR.  Stated that I would speak w/ MD Sainani.

## 2015-10-31 NOTE — ED Triage Notes (Signed)
Pt presents to ED with c/o back pain, chills and generalize body ache, denies fever at home. Hx lung and spinal cord cancer, followed by [facility].

## 2015-10-31 NOTE — ED Notes (Signed)
Pt resting in bed, resp even and unlabored, will continue to montior

## 2015-10-31 NOTE — ED Provider Notes (Signed)
[facility] Emergency Department Provider Note ____________________________________________  Time seen: Approximately 7:10am I have reviewed the triage vital signs and the triage nursing note.  HISTORY  Chief Complaint Generalized Body Aches; Back Pain; and Chills   Historian Patient and daughter  HPI Charlene Williams is a 60 y.o. female with history of metastatic lung cancer, following with Dr. Rogue Bussing, here for uncontrolled pain.  Reports pain to ribs, back, and abdomen.  Some nausea.   Pain is severe and constant, not made worse or better by anything in particular.  Sweats while in pain.  Denies fevers or chills.  No urinary symptoms.  At home pain regimen includes morphine '15mg'$  twice per day and vicodin '5mg'$  prn.  Denies chest pain or palpitations, or any new cough or shortness of breath.    Past Medical History:  Diagnosis Date  . Aneurysm ([place])   . Chest pain, pleuritic   . COPD (chronic obstructive pulmonary disease) ([place])    on home oxygen  . Depression   . Diabetes mellitus without complication ([place])   . Family history of chronic pain 01/17/2015  . Hiatal hernia   . Hypercholesteremia   . Hypertension   . Illicit drug use   . Lung cancer ([place])    adenocarcinoma left lung  . Migraines   . Sleep apnea   . Spine disorder     Patient Active Problem List   Diagnosis Date Noted  . Chest pain 10/24/2015  . Left upper extremity swelling 10/04/2015  . Elevated serum creatinine 09/13/2015  . Sepsis ([place]) 08/09/2015  . HCAP (healthcare-associated pneumonia) 08/09/2015  . Pulmonary embolism ([place]) 07/29/2015  . Primary cancer of left lower lobe of lung ([place]) 07/19/2015  . Pleural effusion   . Recurrent left pleural effusion   . COPD (chronic obstructive pulmonary disease) ([place]) 07/11/2015  . SIRS (systemic inflammatory response syndrome) ([place]) 06/25/2015  . Pleural effusion, left 06/25/2015  . Left lower lobe pneumonia 06/25/2015  .  Hypoxemia 06/25/2015  . Hyponatremia 06/25/2015  . Elevated troponin 06/25/2015  . Generalized weakness 06/25/2015  . Chronic lower extremity pain (Bilateral) 05/19/2015  . Substance use disorder (SUB Risk: Very high) 05/19/2015  . Myofascial pain 05/15/2015  . Muscle spasms of lower extremity 05/15/2015  . Chronic sacroiliac joint pain (Location of Primary Source of Pain) (Bilateral) (R>L) 05/15/2015  . Neurogenic pain 05/15/2015  . Avitaminosis D 05/15/2015  . Chronic pain syndrome 01/17/2015  . Chronic pain 01/17/2015  . Lumbar facet syndrome (Location of Primary Source of Pain) (Bilateral) (R>L) 01/17/2015  . Chronic low back pain (Location of Primary Source of Pain) (Bilateral) (R>L) 01/17/2015  . Chronic radicular lumbar pain (Bilateral) 01/17/2015  . Osteoarthritis of spine with radiculopathy, lumbar region 01/17/2015  . Polysubstance abuse 01/17/2015  . Lumbar spondylosis 01/17/2015  . Lumbar facet arthropathy (Bilateral) 01/17/2015  . Magnesium deficiency 01/17/2015  . Lumbar spinal stenosis (Central Spinal Stenosis) (Severe) (L3-4 and L4-5) 01/17/2015  . Family history of chronic pain 01/17/2015  . Nicotine dependence 01/17/2015  . Smoker 01/17/2015  . Tobacco abuse 01/17/2015  . Essential hypertension, benign 01/17/2015  . Chronic obstructive pulmonary disease (COPD) ([place]) 01/17/2015  . Emphysema of lung ([place]) 01/17/2015  . History of bronchitis 01/17/2015  . History of shortness of breath 01/17/2015  . Generalized anxiety disorder 01/17/2015  . Seizure disorder ([place]) 01/17/2015  . Depression 01/17/2015  . History of panic attacks 01/17/2015  . Non-insulin dependent type 2 diabetes mellitus ([place]) 01/17/2015  Past Surgical History:  Procedure Laterality Date  . ABDOMINAL HYSTERECTOMY    . EYE SURGERY    . HERNIA REPAIR    . PORTACATH PLACEMENT Left 07/14/2015   Procedure: INSERTION PORT-A-CATH;  Surgeon: Nestor Lewandowsky, MD;  Location: [facility] ORS;  Service: General;   Laterality: Left;  . REMOVAL OF PLEURAL DRAINAGE CATHETER N/A 07/18/2015   Procedure: REMOVAL OF PLEURAL DRAINAGE CATHETER;  Surgeon: Nestor Lewandowsky, MD;  Location: [facility] ORS;  Service: Thoracic;  Laterality: N/A;  . VIDEO ASSISTED THORACOSCOPY (VATS)/THOROCOTOMY Left 07/14/2015   Procedure: VIDEO ASSISTED THORACOSCOPY (VATS), pleural biopsy;  Surgeon: Nestor Lewandowsky, MD;  Location: [facility] ORS;  Service: General;  Laterality: Left;    Prior to Admission medications   Medication Sig Start Date End Date Taking? Authorizing Provider  albuterol (PROVENTIL HFA;VENTOLIN HFA) 108 (90 Base) MCG/ACT inhaler Inhale 2 puffs into the lungs every 6 (six) hours as needed for wheezing or shortness of breath. 08/12/15   Gladstone Lighter, MD  alendronate (FOSAMAX) 70 MG tablet Take 70 mg by mouth once a week. Pt takes on Tuesday.   Take with a full glass of water on an empty stomach.    Historical Provider, MD  apixaban (ELIQUIS) 5 MG TABS tablet Take 1 tablet (5 mg total) by mouth 2 (two) times daily. 07/30/15   Henreitta Leber, MD  cetirizine (ZYRTEC) 10 MG tablet Take 10 mg by mouth at bedtime.    Historical Provider, MD  DULoxetine (CYMBALTA) 30 MG capsule Take 90 mg by mouth daily.     Historical Provider, MD  fentaNYL (DURAGESIC - DOSED MCG/HR) 50 MCG/HR Place 1 patch (50 mcg total) onto the skin every 3 (three) days. 10/25/15   Henreitta Leber, MD  fluticasone (FLOVENT HFA) 220 MCG/ACT inhaler Inhale 1 puff into the lungs 2 (two) times daily.    Historical Provider, MD  folic acid (FOLVITE) 1 MG tablet Take 1 mg by mouth daily.    Historical Provider, MD  gabapentin (NEURONTIN) 800 MG tablet Take 800 mg by mouth 3 (three) times daily.    Historical Provider, MD  HYDROcodone-acetaminophen (NORCO/VICODIN) 5-325 MG tablet Take 1-2 tablets by mouth every 4 (four) hours as needed for moderate pain. 10/25/15   Henreitta Leber, MD  lidocaine-prilocaine (EMLA) cream Apply 1 application topically as needed (prior to accessing  port).    Historical Provider, MD  magnesium oxide (MAG-OX) 400 (241.3 Mg) MG tablet Take 400 mg by mouth at bedtime.    Historical Provider, MD  metFORMIN (GLUCOPHAGE-XR) 750 MG 24 hr tablet Take 750 mg by mouth daily with breakfast.    Historical Provider, MD  metoprolol tartrate (LOPRESSOR) 25 MG tablet Take 1 tablet (25 mg total) by mouth 2 (two) times daily. 06/27/15   Bettey Costa, MD  mirtazapine (REMERON) 45 MG tablet Take 45 mg by mouth at bedtime.    Historical Provider, MD  omeprazole (PRILOSEC) 20 MG capsule Take 20 mg by mouth daily before breakfast.     Historical Provider, MD  ondansetron (ZOFRAN) 8 MG tablet Take 8 mg by mouth every 8 (eight) hours as needed for nausea or vomiting.    Historical Provider, MD  prochlorperazine (COMPAZINE) 10 MG tablet TAKE 1 TABLET ('10MG'$ ) BY MOUTH EVERY SIX HOURS AS NEEDED FOR NAUSEA OR VOMITING 09/01/15   Cammie Sickle, MD  promethazine (PHENERGAN) 25 MG suppository Place 1 suppository (25 mg total) rectally every 6 (six) hours as needed for nausea or vomiting. 07/30/15   Belia Heman  Verdell Carmine, MD  simvastatin (ZOCOR) 20 MG tablet Take 20 mg by mouth at bedtime.     Historical Provider, MD  traZODone (DESYREL) 150 MG tablet Take 150 mg by mouth at bedtime.    Historical Provider, MD    Allergies  Allergen Reactions  . Ampicillin Hives and Other (See Comments)    Has patient had a PCN reaction causing immediate rash, facial/tongue/throat swelling, SOB or lightheadedness with hypotension: No Has patient had a PCN reaction causing severe rash involving mucus membranes or skin necrosis: No Has patient had a PCN reaction that required hospitalization No Has patient had a PCN reaction occurring within the last 10 years: Yes If all of the above answers are "NO", then may proceed with Cephalosporin use.  Marland Kitchen Penicillins Hives and Other (See Comments)    Has patient had a PCN reaction causing immediate rash, facial/tongue/throat swelling, SOB or lightheadedness  with hypotension: No Has patient had a PCN reaction causing severe rash involving mucus membranes or skin necrosis: No Has patient had a PCN reaction that required hospitalization No Has patient had a PCN reaction occurring within the last 10 years: Yes If all of the above answers are "NO", then may proceed with Cephalosporin use.    Family History  Problem Relation Age of Onset  . Heart disease Mother   . Breast cancer Sister     Social History Social History  Substance Use Topics  . Smoking status: Former Smoker    Packs/day: 1.50    Years: 45.00    Types: Cigarettes    Quit date: 04/29/2015  . Smokeless tobacco: Never Used     Comment: Quit 2 months ago  . Alcohol use No    Review of Systems  Constitutional: Negative for fever. Eyes: Negative for visual changes. ENT: Negative for sore throat. Cardiovascular: Negative for chest pain. Respiratory: Negative for shortness of breath. Gastrointestinal: No change in bowel movements.  Positive for nausea without vomiting. Genitourinary: Negative for dysuria. Musculoskeletal: Positive for mid back pain. Skin: Negative for rash. Neurological: Negative for headache. 10 point Review of Systems otherwise negative ____________________________________________   PHYSICAL EXAM:  VITAL SIGNS: ED Triage Vitals  Enc Vitals Group     BP 10/31/15 0615 (!) 219/115     Pulse Rate 10/31/15 0615 (!) 117     Resp 10/31/15 0615 (!) 22     Temp 10/31/15 0615 98 F (36.7 C)     Temp Source 10/31/15 0615 Oral     SpO2 10/31/15 0615 99 %     Weight 10/31/15 0615 155 lb (70.3 kg)     Height 10/31/15 0615 '5\' 3"'$  (1.6 m)     Head Circumference --      Peak Flow --      Pain Score 10/31/15 0617 10     Pain Loc --      Pain Edu? --      Excl. in [place]? --      Constitutional: Alert and oriented. Sweating at brow, moving around the bed in pain, trying to find comfortable position. HEENT   Head: Normocephalic and atraumatic.      Eyes:  Conjunctivae are normal. PERRL. Normal extraocular movements.      Ears:         Nose: No congestion/rhinnorhea.   Mouth/Throat: Mucous membranes are moist.   Neck: No stridor. Cardiovascular/Chest: Normal rate, regular rhythm.  No murmurs, rubs, or gallops. Respiratory: Normal respiratory effort without tachypnea nor retractions. Breath sounds are clear  and equal bilaterally. No wheezes/rales/rhonchi. Gastrointestinal: Soft. No distention, no guarding, no rebound. Mild epigastric tenderness.  Genitourinary/rectal:Deferred Musculoskeletal: Nontender with normal range of motion in all extremities. No joint effusions.  No lower extremity tenderness.  No edema. Neurologic:  Normal speech and language. No gross or focal neurologic deficits are appreciated. Skin:  Skin is warm, dry and intact. No rash noted. Psychiatric: Mood and affect are normal. Speech and behavior are normal. Patient exhibits appropriate insight and judgment.  ____________________________________________   EKG I, Lisa Roca, MD, the attending physician have personally viewed and interpreted all ECGs.  95 bpm. Normal sinus rhythm. Narrow QRS. Normal axis. Normal ST and T-wave ____________________________________________  LABS (pertinent positives/negatives)  Labs Reviewed  CBC WITH DIFFERENTIAL/PLATELET - Abnormal; Notable for the following:       Result Value   WBC 19.2 (*)    Hemoglobin 11.8 (*)    HCT 34.5 (*)    RDW 22.1 (*)    Platelets 492 (*)    Neutro Abs 15.4 (*)    Monocytes Absolute 2.0 (*)    All other components within normal limits  COMPREHENSIVE METABOLIC PANEL  URINALYSIS COMPLETEWITH MICROSCOPIC ([facility] ONLY)    ____________________________________________  RADIOLOGY All Xrays were viewed by me. Imaging interpreted by Radiologist.  Chest and abd xray:  IMPRESSION: Nonobstructive bowel gas pattern.  Persistent LEFT pleural effusion and LEFT lung atelectasis greatest at LEFT  base.    CT chest abdomen and pelvis:  CLINICAL DATA: 60 year old female with known carcinoma, metastatic. History of lung carcinoma  New complaints of back pain in chills generalized body aches and fevers.  EXAM: CT CHEST, ABDOMEN, AND PELVIS WITH CONTRAST  TECHNIQUE: Multidetector CT imaging of the chest, abdomen and pelvis was performed following the standard protocol during bolus administration of intravenous contrast.  CONTRAST: 85 cc Isovue 370  COMPARISON: CT 10/05/2015, 07/29/2015  FINDINGS: CT CHEST FINDINGS  Port catheter on the left chest wall via subclavian vein.  No axillary or supraclavicular adenopathy.  Unremarkable appearance of the thoracic inlet, including the visualized thyroid.  Several mediastinal lymph nodes present, similar to comparison CT. None of these are enlarged by CT size criteria.  Small hiatal hernia.  Calcifications of the ascending and descending thoracic aorta. No dissection. No periaortic fluid. No aneurysm. Calcifications of the branch vessels.  No central, lobar, segmental, or proximal subsegmental filling defects to indicate pulmonary emboli.  Heart size unchanged, within normal limits. Pericardial fluid along the left lateral pericardium associated with the left-sided pleural disease extending into the mediastinum. Soft tissue extends from the left-sided pleural disease to the posterior aspect of the left lateral ventricle, with poor visualization of the pericardium.  Calcifications of native coronary vessels including left main, left anterior descending, circumflex, right coronary arteries.  Similar appearance of circumferential soft tissue/fluid of the left pleura, with complete involvement of the parietal pleura. Left upper lobe relatively well aerated, with increasing atelectasis/scarring of the lingula. Complete collapse of the left lower lobe again with the greatest diameter of mass measuring 4.3 cm x 4.9 cm,  similar to the comparison CT. Moderate volume of pleural fluid at the left base.  Loss of volume on the left contributes to slight right to left shift of the mediastinal structures.  No right-sided confluent airspace disease. No pleural effusion. Nodule within the superior segment of the right lower lobe measures 8 -9 mm, unchanged from the comparison CT. Small nodule within the posterior aspect of the right upper lobe (image 35). Small nodule  within the fissure on image 33. Nodules are new from the CT dated 07/29/2015.  Musculoskeletal:  Lucent lesions involving the left third rib, fifth rib, seventh rib, eighth rib, ninth rib, tenth rib.  Lucent lesions involving the right fourth rib, fifth rib, sixth rib, ninth rib, tenth rib. Pathologic fracture at the posterior tenth rib.  Progressing lucent lesions of the manubrium, sternum, as well as the thoracic vertebral bodies of T2, T3, T4, T5, T7, T8, T9, T10, T12.  CT ABDOMEN PELVIS FINDINGS  Abdomen/pelvis:  Unremarkable appearance of liver and spleen.  Unremarkable appearance of bilateral adrenal glands.  No peripancreatic or pericholecystic fluid or inflammatory changes.  No radio-opaque gallstones.  No intrahepatic or extrahepatic biliary ductal dilatation.  No intra-peritoneal free air or significant free-fluid.  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  Appendix is not visualized, however, no inflammatory changes are present adjacent to the cecum to indicate an appendicitis.  Circumferential thickening of the soft tissue involving the antrum of the stomach in the pylorus. No inflammatory changes of the mesenteric fat.  No transition point or dilated bowel.  Right Kidney/Ureter:  No hydronephrosis. No nephrolithiasis. No perinephric stranding. Unremarkable course of the right ureter.  Left Kidney/Ureter:  No hydronephrosis. No nephrolithiasis. No perinephric stranding.  Unremarkable  course of the left ureter. Cystic lesion at the inferior left kidney again noted.  Unremarkable appearance of the urinary bladder.  Hysterectomy.  Calcifications of the abdominal aorta and iliac vasculature.  Musculoskeletal:  Interval progression of disease of the lumbar spine with new metastatic lucent lesions involving L1, L2, L3, L4, and L5. New lesions of the bilateral sacrum, sacral base, and bilateral pelvic bones. New lesions involving the proximal femurs.  No significant degenerative changes of the spine.  IMPRESSION: Significant progression of skeletal metastatic disease, with new and enlarging lesions throughout the majority of the thoracic and lumbar spine, bilateral ribs, sternum, sacrum, bilateral pelvis, left humerus, left scapula, and bilateral femurs. Pathologic fracture of the posterior right tenth rib, as well as the left third rib and left seventh rib.  Similar appearance of left thoracic metastatic disease with circumferential soft tissue/fluid of the parietal and visceral pleura, complete volume loss of the left lower lobe, small pleural effusion, and invasion of the mediastinal structures.  Small nodules of the right upper lobe and left lower lobe, concerning for progression of pulmonary Mets in the right chest.  Circumferential soft tissue thickening in the distal stomach at the pylorus. This may represent gastritis, and correlation with symptoms and potentially upper endoscopy may be considered.      __________________________________________  PROCEDURES  Procedure(s) performed: None  Critical Care performed: None  ____________________________________________   ED COURSE / ASSESSMENT AND PLAN  Pertinent labs & imaging results that were available during my care of the patient were reviewed by me and considered in my medical decision making (see chart for details).   Ms. Reinecke symptoms seem more likely uncontrolled pain from metastatic  cancer, rather than new acute problem or complication, however I will send blood work and an x-ray of the chest and abdomen to make sure no other red flags show up.  Upon review of recent discharge summary, patient had thoracentesis that was done for effusion which was felt to be malignant. It also states that she is on a fentanyl patch and off of the MS Contin, but it does not sound like the family is following this regimen.  However the count is elevated to 19,000, higher than previously. She  is not reporting a fever, but she is reporting body aches. I am going to add on blood cultures. Given her continued pain in the chest as well as the abdomen, I am going to CT her chest to rule out PE or pneumonia, as well as her abdomen to rule out intra-abdominal source of surgical or medical emergency.  Patient is requiring repeated doses of Dilaudid for pain control and will needed to be admitted to the hospital at the very minimum for acute pain control.  CT of the chest and abdomen and pelvis are without evidence of surgical emergency.  No certain evidence of infectious source. Uncertain etiology of the elevated white blood cell count, we'll defer to the medical admission team with regard to any decision to treat this without suspected source. No hypotension or fever and normal lactate, I'm not suspicious of sepsis. There is diffuse progression of metastatic lesions to the bones as well as the lungs. This is certainly getting her uncontrolled pain. I will admit her for pain control due to metastatic cancer.   CONSULTATIONS:   Hospitalist for admission.   Patient / Family / Caregiver informed of clinical course, medical decision-making process, and agree with plan.   ___________________________________________   FINAL CLINICAL IMPRESSION(S) / ED DIAGNOSES   Final diagnoses:  Metastatic cancer ([place])  Uncontrolled pain              Note: This dictation was prepared with Dragon  dictation. Any transcriptional errors that result from this process are unintentional    Lisa Roca, MD 10/31/15 1328

## 2015-10-31 NOTE — ED Notes (Signed)
EDP at bedside  

## 2015-10-31 NOTE — ED Notes (Signed)
Patient AAOx3.  Skin warm and dry.  Posture relaxed and upright.  Visiting with family.  NAD.

## 2015-10-31 NOTE — H&P (Signed)
[facility] [place] NAME: Charlene Williams    MR#:  [id number]  DATE OF BIRTH:  January 14, 1956  DATE OF ADMISSION:  10/31/2015  PRIMARY CARE PHYSICIAN: Kasandra Knudsen, NP   REQUESTING/REFERRING PHYSICIAN: Dr. Lisa Roca  CHIEF COMPLAINT:   Chief Complaint  Patient presents with  . Generalized Body Aches  . Back Pain  . Chills    HISTORY OF PRESENT ILLNESS:  Charlene Williams  is a 60 y.o. female with a known history of Metastatic lung cancer, COPD, diabetes, hypertension, hyperlipidemia, history of migraines, chronic pain, who presented to the hospital complaining of generalized body aches, back pain, chest pain, abdominal pain. Patient was recently discharged from the hospital after having a ultrasound-guided thoracentesis for him small loculated pleural effusion secondary to lung cancer and also treatment for underlying chest pain secondary to bony metastases. She now returns to the hospital complaining of worsening pain throughout her body related to her metastatic disease. She underwent a CT scan of the chest and pelvis which shows extensive bony metastatic disease. She is being admitted for pain control given her uncontrolled pain from her underlying cancer. She does complain of intermittent shortness of breath but denies any nausea vomiting hemoptysis or any other associated symptoms presently.  PAST MEDICAL HISTORY:   Past Medical History:  Diagnosis Date  . Aneurysm ([place])   . Chest pain, pleuritic   . COPD (chronic obstructive pulmonary disease) ([place])    on home oxygen  . Depression   . Diabetes mellitus without complication ([place])   . Family history of chronic pain 01/17/2015  . Hiatal hernia   . Hypercholesteremia   . Hypertension   . Illicit drug use   . Lung cancer ([place])    adenocarcinoma left lung  . Migraines   . Sleep apnea   . Spine disorder     PAST SURGICAL HISTORY:   Past Surgical History:  Procedure Laterality  Date  . ABDOMINAL HYSTERECTOMY    . EYE SURGERY    . HERNIA REPAIR    . PORTACATH PLACEMENT Left 07/14/2015   Procedure: INSERTION PORT-A-CATH;  Surgeon: Nestor Lewandowsky, MD;  Location: [facility] ORS;  Service: General;  Laterality: Left;  . REMOVAL OF PLEURAL DRAINAGE CATHETER N/A 07/18/2015   Procedure: REMOVAL OF PLEURAL DRAINAGE CATHETER;  Surgeon: Nestor Lewandowsky, MD;  Location: [facility] ORS;  Service: Thoracic;  Laterality: N/A;  . VIDEO ASSISTED THORACOSCOPY (VATS)/THOROCOTOMY Left 07/14/2015   Procedure: VIDEO ASSISTED THORACOSCOPY (VATS), pleural biopsy;  Surgeon: Nestor Lewandowsky, MD;  Location: [facility] ORS;  Service: General;  Laterality: Left;    SOCIAL HISTORY:   Social History  Substance Use Topics  . Smoking status: Former Smoker    Packs/day: 1.50    Years: 45.00    Types: Cigarettes    Quit date: 04/29/2015  . Smokeless tobacco: Never Used     Comment: Quit 2 months ago  . Alcohol use No    FAMILY HISTORY:   Family History  Problem Relation Age of Onset  . Heart disease Mother   . Breast cancer Sister     DRUG ALLERGIES:   Allergies  Allergen Reactions  . Ampicillin Hives and Other (See Comments)    Has patient had a PCN reaction causing immediate rash, facial/tongue/throat swelling, SOB or lightheadedness with hypotension: No Has patient had a PCN reaction causing severe rash involving mucus membranes or skin necrosis: No Has patient had a PCN reaction that required hospitalization No Has patient had  a PCN reaction occurring within the last 10 years: Yes If all of the above answers are "NO", then may proceed with Cephalosporin use.  Marland Kitchen Penicillins Hives and Other (See Comments)    Has patient had a PCN reaction causing immediate rash, facial/tongue/throat swelling, SOB or lightheadedness with hypotension: No Has patient had a PCN reaction causing severe rash involving mucus membranes or skin necrosis: No Has patient had a PCN reaction that required hospitalization No Has patient  had a PCN reaction occurring within the last 10 years: Yes If all of the above answers are "NO", then may proceed with Cephalosporin use.    REVIEW OF SYSTEMS:   Review of Systems  Constitutional: Negative for fever and weight loss.  HENT: Negative for congestion, nosebleeds and tinnitus.   Eyes: Negative for blurred vision, double vision and redness.  Respiratory: Negative for cough, hemoptysis and shortness of breath.   Cardiovascular: Negative for chest pain, orthopnea, leg swelling and PND.  Gastrointestinal: Negative for abdominal pain, diarrhea, melena, nausea and vomiting.  Genitourinary: Negative for dysuria, hematuria and urgency.  Musculoskeletal: Positive for back pain and joint pain. Negative for falls.  Neurological: Positive for weakness. Negative for dizziness, tingling, sensory change, focal weakness, seizures and headaches.  Endo/Heme/Allergies: Negative for polydipsia. Does not bruise/bleed easily.  Psychiatric/Behavioral: Negative for depression and memory loss. The patient is not nervous/anxious.     MEDICATIONS AT HOME:   Prior to Admission medications   Medication Sig Start Date End Date Taking? Authorizing Provider  albuterol (PROVENTIL HFA;VENTOLIN HFA) 108 (90 Base) MCG/ACT inhaler Inhale 2 puffs into the lungs every 6 (six) hours as needed for wheezing or shortness of breath. 08/12/15  Yes Gladstone Lighter, MD  alendronate (FOSAMAX) 70 MG tablet Take 70 mg by mouth once a week. Pt takes on Tuesday.   Take with a full glass of water on an empty stomach.   Yes Historical Provider, MD  apixaban (ELIQUIS) 5 MG TABS tablet Take 1 tablet (5 mg total) by mouth 2 (two) times daily. 07/30/15  Yes Henreitta Leber, MD  aspirin EC 81 MG tablet Take 81 mg by mouth daily.   Yes Historical Provider, MD  cetirizine (ZYRTEC) 10 MG tablet Take 10 mg by mouth at bedtime.   Yes Historical Provider, MD  DULoxetine (CYMBALTA) 30 MG capsule Take 90 mg by mouth daily.    Yes Historical  Provider, MD  fentaNYL (DURAGESIC - DOSED MCG/HR) 50 MCG/HR Place 1 patch (50 mcg total) onto the skin every 3 (three) days. 10/25/15  Yes Henreitta Leber, MD  fluticasone (FLOVENT HFA) 220 MCG/ACT inhaler Inhale 1 puff into the lungs 2 (two) times daily.   Yes Historical Provider, MD  folic acid (FOLVITE) 1 MG tablet Take 1 mg by mouth daily.   Yes Historical Provider, MD  gabapentin (NEURONTIN) 800 MG tablet Take 800 mg by mouth 3 (three) times daily.   Yes Historical Provider, MD  HYDROcodone-acetaminophen (NORCO/VICODIN) 5-325 MG tablet Take 1-2 tablets by mouth every 4 (four) hours as needed for moderate pain. 10/25/15  Yes Henreitta Leber, MD  lidocaine-prilocaine (EMLA) cream Apply 1 application topically as needed (prior to accessing port).   Yes Historical Provider, MD  magnesium oxide (MAG-OX) 400 (241.3 Mg) MG tablet Take 400 mg by mouth at bedtime.   Yes Historical Provider, MD  metFORMIN (GLUCOPHAGE-XR) 750 MG 24 hr tablet Take 750 mg by mouth daily with breakfast.   Yes Historical Provider, MD  metoprolol tartrate (LOPRESSOR) 25  MG tablet Take 1 tablet (25 mg total) by mouth 2 (two) times daily. 06/27/15  Yes Bettey Costa, MD  mirtazapine (REMERON) 45 MG tablet Take 45 mg by mouth at bedtime.   Yes Historical Provider, MD  omeprazole (PRILOSEC) 20 MG capsule Take 20 mg by mouth daily before breakfast.    Yes Historical Provider, MD  ondansetron (ZOFRAN) 8 MG tablet Take 8 mg by mouth every 8 (eight) hours as needed for nausea or vomiting.   Yes Historical Provider, MD  prochlorperazine (COMPAZINE) 10 MG tablet TAKE 1 TABLET ('10MG'$ ) BY MOUTH EVERY SIX HOURS AS NEEDED FOR NAUSEA OR VOMITING 09/01/15  Yes Cammie Sickle, MD  promethazine (PHENERGAN) 25 MG suppository Place 1 suppository (25 mg total) rectally every 6 (six) hours as needed for nausea or vomiting. 07/30/15  Yes Henreitta Leber, MD  simvastatin (ZOCOR) 20 MG tablet Take 20 mg by mouth at bedtime.    Yes Historical Provider, MD   traZODone (DESYREL) 150 MG tablet Take 150 mg by mouth at bedtime.   Yes Historical Provider, MD      VITAL SIGNS:  Blood pressure (!) 194/103, pulse (!) 105, temperature 98 F (36.7 C), temperature source Oral, resp. rate 20, height '5\' 3"'$  (1.6 m), weight 70.3 kg (155 lb), SpO2 92 %.  PHYSICAL EXAMINATION:  Physical Exam  GENERAL:  60 y.o.-year-old patient sitting up in bed in mild distress from pain and tearful at times. EYES: Pupils equal, round, reactive to light and accommodation. No scleral icterus. Extraocular muscles intact.  HEENT: Head atraumatic, normocephalic. Oropharynx and nasopharynx clear. No oropharyngeal erythema, moist oral mucosa  NECK:  Supple, no jugular venous distention. No thyroid enlargement, no tenderness.  LUNGS: Normal breath sounds bilaterally, no wheezing, rales, rhonchi. No use of accessory muscles of respiration.  CARDIOVASCULAR: S1, S2 RRR. No murmurs, rubs, gallops, clicks.  ABDOMEN: Soft, nontender, nondistended. Bowel sounds present. No organomegaly or mass.  EXTREMITIES: No pedal edema, cyanosis, or clubbing. + 2 pedal & radial pulses b/l.   NEUROLOGIC: Cranial nerves II through XII are intact. No focal Motor or sensory deficits appreciated b/l PSYCHIATRIC: The patient is alert and oriented x 3. Flat affect.  SKIN: No obvious rash, lesion, or ulcer.   LABORATORY PANEL:   CBC  Recent Labs Lab 10/31/15 0712  WBC 19.2*  HGB 11.8*  HCT 34.5*  PLT 492*   ------------------------------------------------------------------------------------------------------------------  Chemistries   Recent Labs Lab 10/31/15 0804  NA 136  K 3.3*  CL 99*  CO2 27  GLUCOSE 116*  BUN 11  CREATININE 0.54  CALCIUM 10.2  AST 16  ALT 11*  ALKPHOS 125  BILITOT 0.5   ------------------------------------------------------------------------------------------------------------------  Cardiac Enzymes  Recent Labs Lab 10/31/15 0712  TROPONINI 0.03*    ------------------------------------------------------------------------------------------------------------------  RADIOLOGY:  Dg Chest 2 View  Result Date: 10/31/2015 CLINICAL DATA:  Shortness of breath and left-sided pain EXAM: CHEST  2 VIEW COMPARISON:  10/24/2015 FINDINGS: Complex left pleural effusion with lower lobe mass and diffuse pleural thickening. No pneumothorax or interval collapse. Clear right lung. Normal heart size and stable mediastinal contours. Porta catheter on the left with tip at the SVC level. IMPRESSION: No acute finding. Left pleural thickening and loculated fluid is stable compared 10/24/2015. Electronically Signed   By: Monte Fantasia M.D.   On: 10/31/2015 07:03   Ct Angio Chest Pe W/cm &/or Wo Cm  Result Date: 10/31/2015 CLINICAL DATA:  60 year old female with known carcinoma, metastatic. History of lung carcinoma New complaints of  back pain in chills generalized body aches and fevers. EXAM: CT CHEST, ABDOMEN, AND PELVIS WITH CONTRAST TECHNIQUE: Multidetector CT imaging of the chest, abdomen and pelvis was performed following the standard protocol during bolus administration of intravenous contrast. CONTRAST:  85 cc Isovue 370 COMPARISON:  CT 10/05/2015, 07/29/2015 FINDINGS: CT CHEST FINDINGS Port catheter on the left chest wall via subclavian vein. No axillary or supraclavicular adenopathy. Unremarkable appearance of the thoracic inlet, including the visualized thyroid. Several mediastinal lymph nodes present, similar to comparison CT. None of these are enlarged by CT size criteria. Small hiatal hernia. Calcifications of the ascending and descending thoracic aorta. No dissection. No periaortic fluid. No aneurysm. Calcifications of the branch vessels. No central, lobar, segmental, or proximal subsegmental filling defects to indicate pulmonary emboli. Heart size unchanged, within normal limits. Pericardial fluid along the left lateral pericardium associated with the left-sided  pleural disease extending into the mediastinum. Soft tissue extends from the left-sided pleural disease to the posterior aspect of the left lateral ventricle, with poor visualization of the pericardium. Calcifications of native coronary vessels including left main, left anterior descending, circumflex, right coronary arteries. Similar appearance of circumferential soft tissue/fluid of the left pleura, with complete involvement of the parietal pleura. Left upper lobe relatively well aerated, with increasing atelectasis/scarring of the lingula. Complete collapse of the left lower lobe again with the greatest diameter of mass measuring 4.3 cm x 4.9 cm, similar to the comparison CT. Moderate volume of pleural fluid at the left base. Loss of volume on the left contributes to slight right to left shift of the mediastinal structures. No right-sided confluent airspace disease. No pleural effusion. Nodule within the superior segment of the right lower lobe measures 8 -9 mm, unchanged from the comparison CT. Small nodule within the posterior aspect of the right upper lobe (image 35). Small nodule within the fissure on image 33. Nodules are new from the CT dated 07/29/2015. Musculoskeletal: Lucent lesions involving the left third rib, fifth rib, seventh rib, eighth rib, ninth rib, tenth rib. Lucent lesions involving the right fourth rib, fifth rib, sixth rib, ninth rib, tenth rib. Pathologic fracture at the posterior tenth rib. Progressing lucent lesions of the manubrium, sternum, as well as the thoracic vertebral bodies of T2, T3, T4, T5, T7, T8, T9, T10, T12. CT ABDOMEN PELVIS FINDINGS Abdomen/pelvis: Unremarkable appearance of liver and spleen. Unremarkable appearance of bilateral adrenal glands. No peripancreatic or pericholecystic fluid or inflammatory changes. No radio-opaque gallstones. No intrahepatic or extrahepatic biliary ductal dilatation. No intra-peritoneal free air or significant free-fluid. Diverticular disease  without associated inflammatory changes. Appendix is not visualized, however, no inflammatory changes are present adjacent to the cecum to indicate an appendicitis. Circumferential thickening of the soft tissue involving the antrum of the stomach in the pylorus. No inflammatory changes of the mesenteric fat. No transition point or dilated bowel. Right Kidney/Ureter: No hydronephrosis. No nephrolithiasis. No perinephric stranding. Unremarkable course of the right ureter. Left Kidney/Ureter: No hydronephrosis. No nephrolithiasis. No perinephric stranding. Unremarkable course of the left ureter. Cystic lesion at the inferior left kidney again noted. Unremarkable appearance of the urinary bladder. Hysterectomy. Calcifications of the abdominal aorta and iliac vasculature. Musculoskeletal: Interval progression of disease of the lumbar spine with new metastatic lucent lesions involving L1, L2, L3, L4, and L5. New lesions of the bilateral sacrum, sacral base, and bilateral pelvic bones. New lesions involving the proximal femurs. No significant degenerative changes of the spine. IMPRESSION: Significant progression of skeletal metastatic disease, with new and  enlarging lesions throughout the majority of the thoracic and lumbar spine, bilateral ribs, sternum, sacrum, bilateral pelvis, left humerus, left scapula, and bilateral femurs. Pathologic fracture of the posterior right tenth rib, as well as the left third rib and left seventh rib. Similar appearance of left thoracic metastatic disease with circumferential soft tissue/fluid of the parietal and visceral pleura, complete volume loss of the left lower lobe, small pleural effusion, and invasion of the mediastinal structures. Small nodules of the right upper lobe and left lower lobe, concerning for progression of pulmonary Mets in the right chest. Circumferential soft tissue thickening in the distal stomach at the pylorus. This may represent gastritis, and correlation with  symptoms and potentially upper endoscopy may be considered. Signed, Dulcy Fanny. Earleen Newport, DO [facility] Electronically Signed   By: Corrie Mckusick D.O.   On: 10/31/2015 12:34   Ct Abdomen Pelvis W Contrast  Result Date: 10/31/2015 CLINICAL DATA:  60 year old female with known carcinoma, metastatic. History of lung carcinoma New complaints of back pain in chills generalized body aches and fevers. EXAM: CT CHEST, ABDOMEN, AND PELVIS WITH CONTRAST TECHNIQUE: Multidetector CT imaging of the chest, abdomen and pelvis was performed following the standard protocol during bolus administration of intravenous contrast. CONTRAST:  85 cc Isovue 370 COMPARISON:  CT 10/05/2015, 07/29/2015 FINDINGS: CT CHEST FINDINGS Port catheter on the left chest wall via subclavian vein. No axillary or supraclavicular adenopathy. Unremarkable appearance of the thoracic inlet, including the visualized thyroid. Several mediastinal lymph nodes present, similar to comparison CT. None of these are enlarged by CT size criteria. Small hiatal hernia. Calcifications of the ascending and descending thoracic aorta. No dissection. No periaortic fluid. No aneurysm. Calcifications of the branch vessels. No central, lobar, segmental, or proximal subsegmental filling defects to indicate pulmonary emboli. Heart size unchanged, within normal limits. Pericardial fluid along the left lateral pericardium associated with the left-sided pleural disease extending into the mediastinum. Soft tissue extends from the left-sided pleural disease to the posterior aspect of the left lateral ventricle, with poor visualization of the pericardium. Calcifications of native coronary vessels including left main, left anterior descending, circumflex, right coronary arteries. Similar appearance of circumferential soft tissue/fluid of the left pleura, with complete involvement of the parietal pleura. Left upper lobe relatively  well aerated, with increasing atelectasis/scarring of the lingula. Complete collapse of the left lower lobe again with the greatest diameter of mass measuring 4.3 cm x 4.9 cm, similar to the comparison CT. Moderate volume of pleural fluid at the left base. Loss of volume on the left contributes to slight right to left shift of the mediastinal structures. No right-sided confluent airspace disease. No pleural effusion. Nodule within the superior segment of the right lower lobe measures 8 -9 mm, unchanged from the comparison CT. Small nodule within the posterior aspect of the right upper lobe (image 35). Small nodule within the fissure on image 33. Nodules are new from the CT dated 07/29/2015. Musculoskeletal: Lucent lesions involving the left third rib, fifth rib, seventh rib, eighth rib, ninth rib, tenth rib. Lucent lesions involving the right fourth rib, fifth rib, sixth rib, ninth rib, tenth rib. Pathologic fracture at the posterior tenth rib. Progressing lucent lesions of the manubrium, sternum, as well as the thoracic vertebral bodies of T2, T3, T4, T5, T7, T8, T9, T10, T12. CT ABDOMEN PELVIS FINDINGS Abdomen/pelvis: Unremarkable appearance of liver and spleen. Unremarkable appearance of bilateral adrenal glands. No peripancreatic or pericholecystic fluid or inflammatory changes. No radio-opaque  gallstones. No intrahepatic or extrahepatic biliary ductal dilatation. No intra-peritoneal free air or significant free-fluid. 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 Appendix is not visualized, however, no inflammatory changes are present adjacent to the cecum to indicate an appendicitis. Circumferential thickening of the soft tissue involving the antrum of the stomach in the pylorus. No inflammatory changes of the mesenteric fat. No transition point or dilated bowel. Right Kidney/Ureter: No hydronephrosis. No nephrolithiasis. No perinephric stranding. Unremarkable course of the right ureter. Left  Kidney/Ureter: No hydronephrosis. No nephrolithiasis. No perinephric stranding. Unremarkable course of the left ureter. Cystic lesion at the inferior left kidney again noted. Unremarkable appearance of the urinary bladder. Hysterectomy. Calcifications of the abdominal aorta and iliac vasculature. Musculoskeletal: Interval progression of disease of the lumbar spine with new metastatic lucent lesions involving L1, L2, L3, L4, and L5. New lesions of the bilateral sacrum, sacral base, and bilateral pelvic bones. New lesions involving the proximal femurs. No significant degenerative changes of the spine. IMPRESSION: Significant progression of skeletal metastatic disease, with new and enlarging lesions throughout the majority of the thoracic and lumbar spine, bilateral ribs, sternum, sacrum, bilateral pelvis, left humerus, left scapula, and bilateral femurs. Pathologic fracture of the posterior right tenth rib, as well as the left third rib and left seventh rib. Similar appearance of left thoracic metastatic disease with circumferential soft tissue/fluid of the parietal and visceral pleura, complete volume loss of the left lower lobe, small pleural effusion, and invasion of the mediastinal structures. Small nodules of the right upper lobe and left lower lobe, concerning for progression of pulmonary Mets in the right chest. Circumferential soft tissue thickening in the distal stomach at the pylorus. This may represent gastritis, and correlation with symptoms and potentially upper endoscopy may be considered. Signed, Dulcy Fanny. Earleen Newport, DO [facility] Electronically Signed   By: Corrie Mckusick D.O.   On: 10/31/2015 12:34   Dg Abd Acute W/chest  Result Date: 10/31/2015 CLINICAL DATA:  Generalized body aches, sick to her stomach, mid back pain, history lung cancer, COPD, diabetes mellitus, hypertension, smoking EXAM: DG ABDOMEN ACUTE W/ 1V CHEST COMPARISON:  Earlier  chest radiograph of 10/31/2015, abdominal radiograph 07/24/2012 FINDINGS: LEFT subclavian Port-A-Cath with tip projecting over SVC. Enlargement of cardiac silhouette with slight vascular congestion. Atherosclerotic calcification aorta. Persistent LEFT pleural effusion and LEFT lung atelectasis greatest at bases. Minimal atelectasis RIGHT lung base with RIGHT lung otherwise clear. No pneumothorax. Bones demineralized. Nonobstructive bowel gas pattern. Air-filled normal upper normal caliber loops of small bowel in the LEFT mid abdomen. No bowel dilatation, bowel wall thickening, or free air. Surgical clips in pelvis bilaterally. No urinary tract calcification definitely visualized. IMPRESSION: Nonobstructive bowel gas pattern. Persistent LEFT pleural effusion and LEFT lung atelectasis greatest at LEFT base. Electronically Signed   By: Lavonia Dana M.D.   On: 10/31/2015 08:11     IMPRESSION AND PLAN:   60 year old female with past medical history of COPD, metastatic lung cancer, chronic pain, hypertension, hyperlipidemia, diabetes who presents to the hospital due to chest pain, back pain and noted to have extensive metastatic disease to her ribs and spine.  1. Chest/back pain-patient's pain is related to her metastatic skeletal disease noted on the CT scan. -Uncontrolled with the fentanylpatch, oral Percocet. - will start her on some IV dilaudid and cont. Fentanyl patch, Norco.  - ?? Need for radiation treatment for palliative reasons and will defer this to oncology.  2. Metastatic lung cancer-prognosis is very poor given  her extensive metastatic disease. -We'll consult oncology. We'll get MRI of the brain with and without contrast to rule out brain metastases.  3. Neuropathy-continue gabapentin.  4. Depression-continue Cymbalta.  5. Diabetes type 2 without compensation-Place on sliding scale insulin.  6. Essential hypertension-continue metoprolol.  7. Hyperlipidemia continue simvastatin.  8.  Hx of PE - cont. Eliquis.  Consider Palliative Care consult to discuss goals of care but will defer to Oncology.   All the records are reviewed and case discussed with ED provider. Management plans discussed with the patient, family and they are in agreement.  CODE STATUS: Full Code  TOTAL TIME TAKING CARE OF THIS PATIENT: 45 minutes.    Henreitta Leber M.D on 10/31/2015 at 2:46 PM  Between 7am to 6pm - Pager - [phone]  After 6pm go to [email or web address] - password [email or web address] [facility]  [place] [facility]  Office  [phone]  CC: Primary care physician; Kasandra Knudsen, NP

## 2015-10-31 NOTE — Progress Notes (Signed)

## 2015-10-31 NOTE — ED Notes (Signed)
Pt o2 sat 88% on RA after pain medicine, pt sleeping, pt placed on 2L [place]

## 2015-10-31 NOTE — ED Notes (Signed)
Pt resting in bed, family at bedside.

## 2015-10-31 NOTE — Telephone Encounter (Signed)
Patient is scheduled for 1130 PET appt, but she is in the ER with fever, chills and back pain, so they will not do her scan today. Asking that we reschedule it

## 2015-10-31 NOTE — ED Notes (Signed)
Pt reports she is "sick to the stomach", her back is hurting, generalized body aches - pt has lung cancer that has spread into rib cage/vertebrae/spinal cord - pt became nauseated when she released from the hospital on Sunday

## 2015-11-01 ENCOUNTER — Inpatient Hospital Stay: Payer: Medicare Other | Admitting: Internal Medicine

## 2015-11-01 ENCOUNTER — Inpatient Hospital Stay: Payer: Medicare Other

## 2015-11-01 ENCOUNTER — Institutional Professional Consult (permissible substitution): Payer: Medicare Other | Admitting: Radiation Oncology

## 2015-11-01 DIAGNOSIS — R630 Anorexia: Secondary | ICD-10-CM

## 2015-11-01 DIAGNOSIS — K449 Diaphragmatic hernia without obstruction or gangrene: Secondary | ICD-10-CM

## 2015-11-01 DIAGNOSIS — G893 Neoplasm related pain (acute) (chronic): Principal | ICD-10-CM

## 2015-11-01 DIAGNOSIS — F329 Major depressive disorder, single episode, unspecified: Secondary | ICD-10-CM

## 2015-11-01 DIAGNOSIS — Z794 Long term (current) use of insulin: Secondary | ICD-10-CM

## 2015-11-01 DIAGNOSIS — R079 Chest pain, unspecified: Secondary | ICD-10-CM

## 2015-11-01 DIAGNOSIS — Z87891 Personal history of nicotine dependence: Secondary | ICD-10-CM

## 2015-11-01 DIAGNOSIS — C3432 Malignant neoplasm of lower lobe, left bronchus or lung: Secondary | ICD-10-CM

## 2015-11-01 DIAGNOSIS — J449 Chronic obstructive pulmonary disease, unspecified: Secondary | ICD-10-CM

## 2015-11-01 DIAGNOSIS — E7800 Pure hypercholesterolemia, unspecified: Secondary | ICD-10-CM

## 2015-11-01 DIAGNOSIS — Z7951 Long term (current) use of inhaled steroids: Secondary | ICD-10-CM

## 2015-11-01 DIAGNOSIS — Z7982 Long term (current) use of aspirin: Secondary | ICD-10-CM

## 2015-11-01 DIAGNOSIS — Z803 Family history of malignant neoplasm of breast: Secondary | ICD-10-CM

## 2015-11-01 DIAGNOSIS — C7951 Secondary malignant neoplasm of bone: Secondary | ICD-10-CM

## 2015-11-01 DIAGNOSIS — G939 Disorder of brain, unspecified: Secondary | ICD-10-CM

## 2015-11-01 DIAGNOSIS — Z79899 Other long term (current) drug therapy: Secondary | ICD-10-CM

## 2015-11-01 DIAGNOSIS — G4730 Sleep apnea, unspecified: Secondary | ICD-10-CM

## 2015-11-01 DIAGNOSIS — Z9221 Personal history of antineoplastic chemotherapy: Secondary | ICD-10-CM

## 2015-11-01 DIAGNOSIS — Z8669 Personal history of other diseases of the nervous system and sense organs: Secondary | ICD-10-CM

## 2015-11-01 DIAGNOSIS — I10 Essential (primary) hypertension: Secondary | ICD-10-CM

## 2015-11-01 LAB — BASIC METABOLIC PANEL
ANION GAP: 10 (ref 5–15)
BUN: 10 mg/dL (ref 6–20)
CO2: 28 mmol/L (ref 22–32)
Calcium: 10 mg/dL (ref 8.9–10.3)
Chloride: 95 mmol/L — ABNORMAL LOW (ref 101–111)
Creatinine, Ser: 0.82 mg/dL (ref 0.44–1.00)
GFR calc Af Amer: >60 mL/min (ref >60)
GFR calc non Af Amer: >60 mL/min (ref >60)
Glucose, Bld: 91 mg/dL (ref 65–99)
Potassium: 3.4 mmol/L — ABNORMAL LOW (ref 3.5–5.1)
SODIUM: 133 mmol/L — AB (ref 135–145)

## 2015-11-01 LAB — CBC
HEMATOCRIT: 33.3 % — AB (ref 35.0–47.0)
Hemoglobin: 11 g/dL — ABNORMAL LOW (ref 12.0–16.0)
MCH: 27.9 pg (ref 26.0–34.0)
MCHC: 33.1 g/dL (ref 32.0–36.0)
MCV: 84.1 fL (ref 80.0–100.0)
Platelets: 433 10*3/uL (ref 150–440)
RBC: 3.96 MIL/uL (ref 3.80–5.20)
RDW: 22.5 % — AB (ref 11.5–14.5)
WBC: 13.8 10*3/uL — AB (ref 3.6–11.0)

## 2015-11-01 LAB — GLUCOSE, CAPILLARY
GLUCOSE-CAPILLARY: 138 mg/dL — AB (ref 65–99)
Glucose-Capillary: 92 mg/dL (ref 65–99)
Glucose-Capillary: 113 mg/dL — ABNORMAL HIGH (ref 65–99)
Glucose-Capillary: 130 mg/dL — ABNORMAL HIGH (ref 65–99)

## 2015-11-01 LAB — URINE CULTURE: CULTURE: NO GROWTH

## 2015-11-01 MED ORDER — SODIUM CHLORIDE 0.9 % IV SOLN
4.0000 mg | Freq: Once | INTRAVENOUS | Status: AC
  Administered 2015-11-01: 4 mg via INTRAVENOUS
  Filled 2015-11-01: qty 5

## 2015-11-01 MED ORDER — POLYETHYLENE GLYCOL 3350 17 G PO PACK
17.0000 g | PACK | Freq: Every day | ORAL | Status: DC
  Administered 2015-11-01 – 2015-11-02 (×2): 17 g via ORAL
  Filled 2015-11-01 (×2): qty 1

## 2015-11-01 MED ORDER — FENTANYL 75 MCG/HR TD PT72
75.0000 ug | MEDICATED_PATCH | TRANSDERMAL | Status: DC
  Administered 2015-11-01: 19:00:00 75 ug via TRANSDERMAL
  Filled 2015-11-01: qty 1

## 2015-11-01 MED ORDER — DOCUSATE SODIUM 100 MG PO CAPS
100.0000 mg | ORAL_CAPSULE | Freq: Two times a day (BID) | ORAL | Status: DC
  Administered 2015-11-01 – 2015-11-02 (×3): 100 mg via ORAL
  Filled 2015-11-01 (×3): qty 1

## 2015-11-01 MED ORDER — GADOBENATE DIMEGLUMINE 529 MG/ML IV SOLN
15.0000 mL | Freq: Once | INTRAVENOUS | Status: AC | PRN
  Administered 2015-11-01: 14 mL via INTRAVENOUS

## 2015-11-01 MED ORDER — HYDROMORPHONE HCL 1 MG/ML IJ SOLN
1.5000 mg | INTRAMUSCULAR | Status: DC | PRN
  Administered 2015-11-01 – 2015-11-02 (×6): 1.5 mg via INTRAVENOUS
  Filled 2015-11-01 (×6): qty 2

## 2015-11-01 NOTE — Progress Notes (Signed)
Charlene Williams NAME: Charlene Williams    MRN#:  [id number]  DATE OF BIRTH:  Dec 26, 1955  SUBJECTIVE:  Hospital Day: 1 day Charlene Williams is a 60 y.o. female presenting with Generalized Body Aches; Back Pain; and Chills .   Overnight events:No acute overnight events Interval Events: Still complains of generalized pain, states she's doing this for her family  REVIEW OF SYSTEMS:  CONSTITUTIONAL: No fever, Positive fatigue or weakness.  EYES: No blurred or double vision.  EARS, NOSE, AND THROAT: No tinnitus or ear pain.  RESPIRATORY: No cough, shortness of breath, wheezing or hemoptysis.  CARDIOVASCULAR: No chest pain, orthopnea, edema.  GASTROINTESTINAL: No nausea, vomiting, diarrhea or abdominal pain.  GENITOURINARY: No dysuria, hematuria.  ENDOCRINE: No polyuria, nocturia,  HEMATOLOGY: No anemia, easy bruising or bleeding SKIN: No rash or lesion. MUSCULOSKELETAL: Essentially diffuse body pain  NEUROLOGIC: No tingling, numbness, weakness.  PSYCHIATRY: No anxiety or depression.   DRUG ALLERGIES:   Allergies  Allergen Reactions  . Ampicillin Hives and Other (See Comments)    Has patient had a PCN reaction causing immediate rash, facial/tongue/throat swelling, SOB or lightheadedness with hypotension: No Has patient had a PCN reaction causing severe rash involving mucus membranes or skin necrosis: No Has patient had a PCN reaction that required hospitalization No Has patient had a PCN reaction occurring within the last 10 years: Yes If all of the above answers are "NO", then may proceed with Cephalosporin use.  Marland Kitchen Penicillins Hives and Other (See Comments)    Has patient had a PCN reaction causing immediate rash, facial/tongue/throat swelling, SOB or lightheadedness with hypotension: No Has patient had a PCN reaction causing severe rash involving mucus membranes or skin necrosis: No Has patient had a PCN reaction that  required hospitalization No Has patient had a PCN reaction occurring within the last 10 years: Yes If all of the above answers are "NO", then may proceed with Cephalosporin use.    VITALS:  Blood pressure (!) 141/79, pulse (!) 103, temperature 99.6 F (37.6 C), temperature source Oral, resp. rate 16, height '5\' 3"'$  (1.6 m), weight 70.3 kg (155 lb), SpO2 92 %.  PHYSICAL EXAMINATION:  VITAL SIGNS: Vitals:   10/31/15 2255 11/01/15 0447  BP: (!) 158/85 (!) 141/79  Pulse: 91 (!) 103  Resp:  16  Temp:  99.6 F (37.6 C)   GENERAL:59 y.o.female currently in no acute distress.  HEAD: Normocephalic, atraumatic.  EYES: Pupils equal, round, reactive to light. Extraocular muscles intact. No scleral icterus.  MOUTH: Moist mucosal membrane. Dentition intact. No abscess noted.  EAR, NOSE, THROAT: Clear without exudates. No external lesions.  NECK: Supple. No thyromegaly. No nodules. No JVD.  PULMONARY: Clear to ascultation, without wheeze rails or rhonci. No use of accessory muscles, Good respiratory effort. good air entry bilaterally CHEST: Nontender to palpation.  CARDIOVASCULAR: S1 and S2. Regular rate and rhythm. No murmurs, rubs, or gallops. No edema. Pedal pulses 2+ bilaterally.  GASTROINTESTINAL: Soft, nontender, nondistended. No masses. Positive bowel sounds. No hepatosplenomegaly.  MUSCULOSKELETAL: No swelling, clubbing, or edema. Range of motion full in all extremities.  NEUROLOGIC: Cranial nerves II through XII are intact. No gross focal neurological deficits. Sensation intact. Reflexes intact.  SKIN: No ulceration, lesions, rashes, or cyanosis. Skin warm and dry. Turgor intact.  PSYCHIATRIC: Mood, affect within normal limits. The patient is awake, alert and oriented x 3. Insight, judgment intact.      LABORATORY PANEL:   CBC  Recent Labs Lab 11/01/15 0459  WBC 13.8*  HGB 11.0*  HCT 33.3*  PLT 433    ------------------------------------------------------------------------------------------------------------------  Chemistries   Recent Labs Lab 10/31/15 0804 11/01/15 0459  NA 136 133*  K 3.3* 3.4*  CL 99* 95*  CO2 27 28  GLUCOSE 116* 91  BUN 11 10  CREATININE 0.54 0.82  CALCIUM 10.2 10.0  AST 16  --   ALT 11*  --   ALKPHOS 125  --   BILITOT 0.5  --    ------------------------------------------------------------------------------------------------------------------  Cardiac Enzymes  Recent Labs Lab 10/31/15 0712  TROPONINI 0.03*   ------------------------------------------------------------------------------------------------------------------  RADIOLOGY:  Dg Chest 2 View  Result Date: 10/31/2015 CLINICAL DATA:  Shortness of breath and left-sided pain EXAM: CHEST  2 VIEW COMPARISON:  10/24/2015 FINDINGS: Complex left pleural effusion with lower lobe mass and diffuse pleural thickening. No pneumothorax or interval collapse. Clear right lung. Normal heart size and stable mediastinal contours. Porta catheter on the left with tip at the SVC level. IMPRESSION: No acute finding. Left pleural thickening and loculated fluid is stable compared 10/24/2015. Electronically Signed   By: Monte Fantasia M.D.   On: 10/31/2015 07:03   Ct Angio Chest Pe W/cm &/or Wo Cm  Result Date: 10/31/2015 CLINICAL DATA:  60 year old female with known carcinoma, metastatic. History of lung carcinoma New complaints of back pain in chills generalized body aches and fevers. EXAM: CT CHEST, ABDOMEN, AND PELVIS WITH CONTRAST TECHNIQUE: Multidetector CT imaging of the chest, abdomen and pelvis was performed following the standard protocol during bolus administration of intravenous contrast. CONTRAST:  85 cc Isovue 370 COMPARISON:  CT 10/05/2015, 07/29/2015 FINDINGS: CT CHEST FINDINGS Port catheter on the left chest wall via subclavian vein. No axillary or supraclavicular adenopathy. Unremarkable appearance of  the thoracic inlet, including the visualized thyroid. Several mediastinal lymph nodes present, similar to comparison CT. None of these are enlarged by CT size criteria. Small hiatal hernia. Calcifications of the ascending and descending thoracic aorta. No dissection. No periaortic fluid. No aneurysm. Calcifications of the branch vessels. No central, lobar, segmental, or proximal subsegmental filling defects to indicate pulmonary emboli. Heart size unchanged, within normal limits. Pericardial fluid along the left lateral pericardium associated with the left-sided pleural disease extending into the mediastinum. Soft tissue extends from the left-sided pleural disease to the posterior aspect of the left lateral ventricle, with poor visualization of the pericardium. Calcifications of native coronary vessels including left main, left anterior descending, circumflex, right coronary arteries. Similar appearance of circumferential soft tissue/fluid of the left pleura, with complete involvement of the parietal pleura. Left upper lobe relatively well aerated, with increasing atelectasis/scarring of the lingula. Complete collapse of the left lower lobe again with the greatest diameter of mass measuring 4.3 cm x 4.9 cm, similar to the comparison CT. Moderate volume of pleural fluid at the left base. Loss of volume on the left contributes to slight right to left shift of the mediastinal structures. No right-sided confluent airspace disease. No pleural effusion. Nodule within the superior segment of the right lower lobe measures 8 -9 mm, unchanged from the comparison CT. Small nodule within the posterior aspect of the right upper lobe (image 35). Small nodule within the fissure on image 33. Nodules are new from the CT dated 07/29/2015. Musculoskeletal: Lucent lesions involving the left third rib, fifth rib, seventh rib, eighth rib, ninth rib, tenth rib. Lucent lesions involving the right fourth rib, fifth rib, sixth rib, ninth  rib, tenth rib. Pathologic fracture at the  posterior tenth rib. Progressing lucent lesions of the manubrium, sternum, as well as the thoracic vertebral bodies of T2, T3, T4, T5, T7, T8, T9, T10, T12. CT ABDOMEN PELVIS FINDINGS Abdomen/pelvis: Unremarkable appearance of liver and spleen. Unremarkable appearance of bilateral adrenal glands. No peripancreatic or pericholecystic fluid or inflammatory changes. No radio-opaque gallstones. No intrahepatic or extrahepatic biliary ductal dilatation. No intra-peritoneal free air or significant free-fluid. 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 Appendix is not visualized, however, no inflammatory changes are present adjacent to the cecum to indicate an appendicitis. Circumferential thickening of the soft tissue involving the antrum of the stomach in the pylorus. No inflammatory changes of the mesenteric fat. No transition point or dilated bowel. Right Kidney/Ureter: No hydronephrosis. No nephrolithiasis. No perinephric stranding. Unremarkable course of the right ureter. Left Kidney/Ureter: No hydronephrosis. No nephrolithiasis. No perinephric stranding. Unremarkable course of the left ureter. Cystic lesion at the inferior left kidney again noted. Unremarkable appearance of the urinary bladder. Hysterectomy. Calcifications of the abdominal aorta and iliac vasculature. Musculoskeletal: Interval progression of disease of the lumbar spine with new metastatic lucent lesions involving L1, L2, L3, L4, and L5. New lesions of the bilateral sacrum, sacral base, and bilateral pelvic bones. New lesions involving the proximal femurs. No significant degenerative changes of the spine. IMPRESSION: Significant progression of skeletal metastatic disease, with new and enlarging lesions throughout the majority of the thoracic and lumbar spine, bilateral ribs, sternum, sacrum, bilateral pelvis, left humerus, left scapula, and bilateral femurs. Pathologic fracture of the  posterior right tenth rib, as well as the left third rib and left seventh rib. Similar appearance of left thoracic metastatic disease with circumferential soft tissue/fluid of the parietal and visceral pleura, complete volume loss of the left lower lobe, small pleural effusion, and invasion of the mediastinal structures. Small nodules of the right upper lobe and left lower lobe, concerning for progression of pulmonary Mets in the right chest. Circumferential soft tissue thickening in the distal stomach at the pylorus. This may represent gastritis, and correlation with symptoms and potentially upper endoscopy may be considered. Signed, Dulcy Fanny. Earleen Newport, DO [facility] Electronically Signed   By: Corrie Mckusick D.O.   On: 10/31/2015 12:34   Ct Abdomen Pelvis W Contrast  Result Date: 10/31/2015 CLINICAL DATA:  60 year old female with known carcinoma, metastatic. History of lung carcinoma New complaints of back pain in chills generalized body aches and fevers. EXAM: CT CHEST, ABDOMEN, AND PELVIS WITH CONTRAST TECHNIQUE: Multidetector CT imaging of the chest, abdomen and pelvis was performed following the standard protocol during bolus administration of intravenous contrast. CONTRAST:  85 cc Isovue 370 COMPARISON:  CT 10/05/2015, 07/29/2015 FINDINGS: CT CHEST FINDINGS Port catheter on the left chest wall via subclavian vein. No axillary or supraclavicular adenopathy. Unremarkable appearance of the thoracic inlet, including the visualized thyroid. Several mediastinal lymph nodes present, similar to comparison CT. None of these are enlarged by CT size criteria. Small hiatal hernia. Calcifications of the ascending and descending thoracic aorta. No dissection. No periaortic fluid. No aneurysm. Calcifications of the branch vessels. No central, lobar, segmental, or proximal subsegmental filling defects to indicate pulmonary emboli. Heart size unchanged, within normal  limits. Pericardial fluid along the left lateral pericardium associated with the left-sided pleural disease extending into the mediastinum. Soft tissue extends from the left-sided pleural disease to the posterior aspect of the left lateral ventricle, with poor visualization of the pericardium. Calcifications of native coronary vessels including left main, left anterior descending,  circumflex, right coronary arteries. Similar appearance of circumferential soft tissue/fluid of the left pleura, with complete involvement of the parietal pleura. Left upper lobe relatively well aerated, with increasing atelectasis/scarring of the lingula. Complete collapse of the left lower lobe again with the greatest diameter of mass measuring 4.3 cm x 4.9 cm, similar to the comparison CT. Moderate volume of pleural fluid at the left base. Loss of volume on the left contributes to slight right to left shift of the mediastinal structures. No right-sided confluent airspace disease. No pleural effusion. Nodule within the superior segment of the right lower lobe measures 8 -9 mm, unchanged from the comparison CT. Small nodule within the posterior aspect of the right upper lobe (image 35). Small nodule within the fissure on image 33. Nodules are new from the CT dated 07/29/2015. Musculoskeletal: Lucent lesions involving the left third rib, fifth rib, seventh rib, eighth rib, ninth rib, tenth rib. Lucent lesions involving the right fourth rib, fifth rib, sixth rib, ninth rib, tenth rib. Pathologic fracture at the posterior tenth rib. Progressing lucent lesions of the manubrium, sternum, as well as the thoracic vertebral bodies of T2, T3, T4, T5, T7, T8, T9, T10, T12. CT ABDOMEN PELVIS FINDINGS Abdomen/pelvis: Unremarkable appearance of liver and spleen. Unremarkable appearance of bilateral adrenal glands. No peripancreatic or pericholecystic fluid or inflammatory changes. No radio-opaque gallstones. No intrahepatic or extrahepatic biliary  ductal dilatation. No intra-peritoneal free air or significant free-fluid. 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 Appendix is not visualized, however, no inflammatory changes are present adjacent to the cecum to indicate an appendicitis. Circumferential thickening of the soft tissue involving the antrum of the stomach in the pylorus. No inflammatory changes of the mesenteric fat. No transition point or dilated bowel. Right Kidney/Ureter: No hydronephrosis. No nephrolithiasis. No perinephric stranding. Unremarkable course of the right ureter. Left Kidney/Ureter: No hydronephrosis. No nephrolithiasis. No perinephric stranding. Unremarkable course of the left ureter. Cystic lesion at the inferior left kidney again noted. Unremarkable appearance of the urinary bladder. Hysterectomy. Calcifications of the abdominal aorta and iliac vasculature. Musculoskeletal: Interval progression of disease of the lumbar spine with new metastatic lucent lesions involving L1, L2, L3, L4, and L5. New lesions of the bilateral sacrum, sacral base, and bilateral pelvic bones. New lesions involving the proximal femurs. No significant degenerative changes of the spine. IMPRESSION: Significant progression of skeletal metastatic disease, with new and enlarging lesions throughout the majority of the thoracic and lumbar spine, bilateral ribs, sternum, sacrum, bilateral pelvis, left humerus, left scapula, and bilateral femurs. Pathologic fracture of the posterior right tenth rib, as well as the left third rib and left seventh rib. Similar appearance of left thoracic metastatic disease with circumferential soft tissue/fluid of the parietal and visceral pleura, complete volume loss of the left lower lobe, small pleural effusion, and invasion of the mediastinal structures. Small nodules of the right upper lobe and left lower lobe, concerning for progression of pulmonary Mets in the right chest. Circumferential soft tissue  thickening in the distal stomach at the pylorus. This may represent gastritis, and correlation with symptoms and potentially upper endoscopy may be considered. Signed, Dulcy Fanny. Earleen Newport, DO [facility] Electronically Signed   By: Corrie Mckusick D.O.   On: 10/31/2015 12:34   Dg Abd Acute W/chest  Result Date: 10/31/2015 CLINICAL DATA:  Generalized body aches, sick to her stomach, mid back pain, history lung cancer, COPD, diabetes mellitus, hypertension, smoking EXAM: DG ABDOMEN ACUTE W/ 1V CHEST COMPARISON:  Earlier chest radiograph of 10/31/2015, abdominal radiograph 07/24/2012 FINDINGS: LEFT subclavian Port-A-Cath with tip projecting over SVC. Enlargement of cardiac silhouette with slight vascular congestion. Atherosclerotic calcification aorta. Persistent LEFT pleural effusion and LEFT lung atelectasis greatest at bases. Minimal atelectasis RIGHT lung base with RIGHT lung otherwise clear. No pneumothorax. Bones demineralized. Nonobstructive bowel gas pattern. Air-filled normal upper normal caliber loops of small bowel in the LEFT mid abdomen. No bowel dilatation, bowel wall thickening, or free air. Surgical clips in pelvis bilaterally. No urinary tract calcification definitely visualized. IMPRESSION: Nonobstructive bowel gas pattern. Persistent LEFT pleural effusion and LEFT lung atelectasis greatest at LEFT base. Electronically Signed   By: Lavonia Dana M.D.   On: 10/31/2015 08:11    EKG:   Orders placed or performed during the hospital encounter of 10/31/15  . ED EKG  . ED EKG  . EKG 12-Lead  . EKG 12-Lead    ASSESSMENT AND PLAN:   Iveliz Garay is a 60 y.o. female presenting with Generalized Body Aches; Back Pain; and Chills . Admitted 10/31/2015 : Day #: 1 day 1. Intractable pain: At home patient does receive 5 mg Percocets and a fentanyl patch pain has improved somewhat on IV Dilaudid however does not completely resolve pain and pain  returns prior to next dosage. Pain secondary to metastatic disease. We will increase pain medication for now with altered mental transition off of IV, spoke with oncology this morning planning on family meeting to discuss prognosis lead but later today.   2. Metastatic lung cancer-prognosis is very poor given her extensive metastatic disease. [facility] consult oncology. We'll get MRI of the brain with and without contrast to rule out brain metastases.  3. Neuropathy-continue gabapentin.  4. Depression-continue Cymbalta.  5. Diabetes type 2 non-insulin-requiring Place on sliding scale insulin.  6. Essential hypertension-continue metoprolol.  7. Hyperlipidemia unspecified continue simvastatin.  8. Hx of PE - cont. Eliquis.   All the records are reviewed and case discussed with Care Management/Social Workerr. Management plans discussed with the patient, family and they are in agreement.  CODE STATUS: full TOTAL TIME TAKING CARE OF THIS PATIENT: 33 minutes.   POSSIBLE D/C IN 2-3DAYS, DEPENDING ON CLINICAL CONDITION.   Rain Friedt,  Karenann Cai.D on 11/01/2015 at 8:37 AM  Between 7am to 6pm - Pager - [phone]  After 6pm: House Pager: - [phone]  [facility]  Office  [phone]  CC: Primary care physician; Kasandra Knudsen, NP

## 2015-11-01 NOTE — Plan of Care (Signed)
Problem: Pain Managment: Goal: General experience of comfort will improve Outcome: Not Progressing Pt requiring prn pain medication every 2 hours. Pain still rated as severe.

## 2015-11-01 NOTE — Consult Note (Signed)
Except an outstanding is perfect of Radiation Oncology NEW PATIENT EVALUATION  Name: Charlene Williams  MRN: [id number]  Date:   10/31/2015     DOB: 07/09/1955   This 60 y.o. female patient presents to the clinic for initial evaluation of stage IV lung cancer with significant bony metastatic disease and significant spine pain.  REFERRING PHYSICIAN: No ref. provider found  CHIEF COMPLAINT:  Chief Complaint  Patient presents with  . Generalized Body Aches  . Back Pain  . Chills    DIAGNOSIS: The primary encounter diagnosis was Metastatic cancer ([place]). Diagnoses of Uncontrolled pain and Metastatic carcinoma ([place]) were also pertinent to this visit.   PREVIOUS INVESTIGATIONS:  CT scans chest abdomen and pelvis reviewed Pathology reviewed Clinical notes reviewed  HPI: Patient is a 60 year old female with known history of stage IV metastatic lung cancer recently admitted to [facility] with generalized pain including back chest and abdomen. She recently had a thoracentesis for pleural effusion secondary to significant locally advanced lung cancer. CT scans of chest abdomen and pelvis shows significant bony metastatic disease with marked destruction of vertebral bodies throughout her lumbar and thoracic spine. Pain seems to be under control now that she is hospitalized I been asked to evaluate her for possible palliative radiation therapy. She specifically denies cough hemoptysis. She does have some shortness of breath.  PLANNED TREATMENT REGIMEN: Palliative radiation therapy to spine  PAST MEDICAL HISTORY:  has a past medical history of Aneurysm ([place]); Chest pain, pleuritic; COPD (chronic obstructive pulmonary disease) ([place]); Depression; Diabetes mellitus without complication ([place]); Family history of chronic pain (01/17/2015); Hiatal hernia; Hypercholesteremia; Hypertension; Illicit drug use; Lung cancer ([place]); Migraines; Sleep apnea; and Spine disorder.    PAST SURGICAL HISTORY:  Past Surgical  History:  Procedure Laterality Date  . ABDOMINAL HYSTERECTOMY    . EYE SURGERY    . HERNIA REPAIR    . PORTACATH PLACEMENT Left 07/14/2015   Procedure: INSERTION PORT-A-CATH;  Surgeon: Nestor Lewandowsky, MD;  Location: [facility] ORS;  Service: General;  Laterality: Left;  . REMOVAL OF PLEURAL DRAINAGE CATHETER N/A 07/18/2015   Procedure: REMOVAL OF PLEURAL DRAINAGE CATHETER;  Surgeon: Nestor Lewandowsky, MD;  Location: [facility] ORS;  Service: Thoracic;  Laterality: N/A;  . VIDEO ASSISTED THORACOSCOPY (VATS)/THOROCOTOMY Left 07/14/2015   Procedure: VIDEO ASSISTED THORACOSCOPY (VATS), pleural biopsy;  Surgeon: Nestor Lewandowsky, MD;  Location: [facility] ORS;  Service: General;  Laterality: Left;    FAMILY HISTORY: family history includes Breast cancer in her sister; Heart disease in her mother.  SOCIAL HISTORY:  reports that she quit smoking about 6 months ago. Her smoking use included Cigarettes. She has a 67.50 pack-year smoking history. She has never used smokeless tobacco. She reports that she does not drink alcohol.  ALLERGIES: Ampicillin and Penicillins  MEDICATIONS:  Current Facility-Administered Medications  Medication Dose Route Frequency Provider Last Rate Last Dose  . acetaminophen (TYLENOL) tablet 650 mg  650 mg Oral Q6H PRN Henreitta Leber, MD       Or  . acetaminophen (TYLENOL) suppository 650 mg  650 mg Rectal Q6H PRN Henreitta Leber, MD      . albuterol (PROVENTIL) (2.5 MG/3ML) 0.083% nebulizer solution 2.5 mg  2.5 mg Inhalation Q6H PRN Henreitta Leber, MD      . apixaban (ELIQUIS) tablet 5 mg  5 mg Oral BID Henreitta Leber, MD   5 mg at 11/01/15 1059  . aspirin EC tablet 81 mg  81 mg Oral Daily Henreitta Leber, MD  81 mg at 11/01/15 1058  . budesonide (PULMICORT) nebulizer solution 0.25 mg  0.25 mg Nebulization BID Henreitta Leber, MD   0.25 mg at 11/01/15 0738  . docusate sodium (COLACE) capsule 100 mg  100 mg Oral BID Lytle Butte, MD   100 mg at 11/01/15 1059  . DULoxetine (CYMBALTA) DR capsule 90  mg  90 mg Oral Daily Henreitta Leber, MD   90 mg at 11/01/15 1058  . fentaNYL (DURAGESIC - dosed mcg/hr) 50 mcg  50 mcg Transdermal Q72H Henreitta Leber, MD   50 mcg at 10/31/15 2028  . folic acid (FOLVITE) tablet 1 mg  1 mg Oral Daily Henreitta Leber, MD   1 mg at 11/01/15 1059  . gabapentin (NEURONTIN) capsule 800 mg  800 mg Oral TID Henreitta Leber, MD   800 mg at 11/01/15 1058  . HYDROcodone-acetaminophen (NORCO/VICODIN) 5-325 MG per tablet 1-2 tablet  1-2 tablet Oral Q4H PRN Henreitta Leber, MD   2 tablet at 11/01/15 0917  . HYDROmorphone (DILAUDID) injection 1.5 mg  1.5 mg Intravenous Q2H PRN Lytle Butte, MD   1.5 mg at 11/01/15 1054  . insulin aspart (novoLOG) injection 0-5 Units  0-5 Units Subcutaneous QHS Henreitta Leber, MD      . insulin aspart (novoLOG) injection 0-9 Units  0-9 Units Subcutaneous TID WC Henreitta Leber, MD      . loratadine (CLARITIN) tablet 10 mg  10 mg Oral Daily Henreitta Leber, MD   10 mg at 11/01/15 1059  . magnesium oxide (MAG-OX) tablet 400 mg  400 mg Oral QHS Henreitta Leber, MD   400 mg at 10/31/15 2222  . metoprolol tartrate (LOPRESSOR) tablet 25 mg  25 mg Oral BID Henreitta Leber, MD   25 mg at 11/01/15 1058  . mirtazapine (REMERON) tablet 45 mg  45 mg Oral QHS Henreitta Leber, MD   45 mg at 10/31/15 2222  . ondansetron (ZOFRAN) tablet 4 mg  4 mg Oral Q6H PRN Henreitta Leber, MD       Or  . ondansetron (ZOFRAN) injection 4 mg  4 mg Intravenous Q6H PRN Henreitta Leber, MD      . pantoprazole (PROTONIX) EC tablet 40 mg  40 mg Oral Daily Henreitta Leber, MD   40 mg at 11/01/15 1058  . polyethylene glycol (MIRALAX / GLYCOLAX) packet 17 g  17 g Oral Daily Lytle Butte, MD   17 g at 11/01/15 1058  . promethazine (PHENERGAN) suppository 25 mg  25 mg Rectal Q6H PRN Henreitta Leber, MD      . simvastatin (ZOCOR) tablet 20 mg  20 mg Oral QHS Henreitta Leber, MD   20 mg at 10/31/15 2222  . traZODone (DESYREL) tablet 150 mg  150 mg Oral QHS Henreitta Leber, MD   150  mg at 10/31/15 2221    ECOG PERFORMANCE STATUS:  2 - Symptomatic, <50% confined to bed  REVIEW OF SYSTEMS: Except for the significant bone pain Patient denies any weight loss, fatigue, weakness, fever, chills or night sweats. Patient denies any loss of vision, blurred vision. Patient denies any ringing  of the ears or hearing loss. No irregular heartbeat. Patient denies heart murmur or history of fainting. Patient denies any chest pain or pain radiating to her upper extremities. Patient denies any shortness of breath, difficulty breathing at night, cough or hemoptysis. Patient denies any swelling in the lower legs.  Patient denies any nausea vomiting, vomiting of blood, or coffee ground material in the vomitus. Patient denies any stomach pain. Patient states has had normal bowel movements no significant constipation or diarrhea. Patient denies any dysuria, hematuria or significant nocturia. Patient denies any problems walking, swelling in the joints or loss of balance. Patient denies any skin changes, loss of hair or loss of weight. Patient denies any excessive worrying or anxiety or significant depression. Patient denies any problems with insomnia. Patient denies excessive thirst, polyuria, polydipsia. Patient denies any swollen glands, patient denies easy bruising or easy bleeding. Patient denies any recent infections, allergies or URI. Patient "s visual fields have not changed significantly in recent time.    PHYSICAL EXAM: BP (!) 141/79 (BP Location: Right Arm)   Pulse (!) 103   Temp 99.6 F (37.6 C) (Oral)   Resp 16   Ht '5\' 3"'$  (1.6 m)   Wt 155 lb (70.3 kg)   SpO2 92%   BMI 27.46 kg/m  Well-developed female seen in her hospital bed in NAD. She has decreased breath sounds in her left lower lobe. Motor sensory and DTR levels are equal and symmetric in the upper lower extremities. Proprioception is intact. Well-developed well-nourished patient in NAD. HEENT reveals PERLA, EOMI, discs not  visualized.  Oral cavity is clear. No oral mucosal lesions are identified. Neck is clear without evidence of cervical or supraclavicular adenopathy. Lungs are clear to A&P. Cardiac examination is essentially unremarkable with regular rate and rhythm without murmur rub or thrill. Abdomen is benign with no organomegaly or masses noted. Motor sensory and DTR levels are equal and symmetric in the upper and lower extremities. Cranial nerves II through XII are grossly intact. Proprioception is intact. No peripheral adenopathy or edema is identified. No motor or sensory levels are noted. Crude visual fields are within normal range.  LABORATORY DATA: Cytology from pleural fluid on ultrasound-guided thoracentesis positive for metastatic adenocarcinoma    RADIOLOGY RESULTS: CT scans chest abdomen and pelvis reviewed.   IMPRESSION: This time I to go ahead with palliative radiation therapy to her spine. I will try to Compass is much gross vertebral body destruction is possible in the thoracic and lumbar spine. We'll treat 3000 cGy in 10 fractions. Risks and benefits of treatment including possible radiation esophagitis, alteration of blood counts fatigue skin reaction all were discussed in detail with the patient. I have personally set up and ordered CT simulation for tomorrow. Will start her treatments early next week.  I would like to take this opportunity to thank you for allowing me to participate in the care of your patient.Marland Kitchen  PLAN: As above  Armstead Peaks., MD

## 2015-11-01 NOTE — Consult Note (Signed)
[facility] CONSULT NOTE  Patient Care Team: Kasandra Knudsen, NP as PCP - General (Nurse Practitioner)  CHIEF COMPLAINTS/PURPOSE OF CONSULTATION:  Metastatic lung cancer  HISTORY OF PRESENTING ILLNESS:  Charlene Williams 60 y.o.  female diagnosis of metastatic adenocarcinoma the lung currently status post 4 cycles of carboplatin-Alimta and Keytruda- was recently admitted to the hospital for worsening pain and then discharged.   Patient comes back to the hospital just after less than a week of discharge- with worsening pain in her back; and  "all over". CT of the chest abdomen pelvis shows multiple significant progressive disease in the bones. MRI of the brain shows multiple lytic lesions in the calvarium; no intraparenchymal disease.  Oncology has been consult for further recommendations for pain control/prognosis/goals of care.    ROS: Poor appetite no nausea no vomiting. No diarrhea. A complete 10 point review of system is done which is negative except mentioned above in history of present illness  MEDICAL HISTORY:  Past Medical History:  Diagnosis Date  . Aneurysm ([place])   . Chest pain, pleuritic   . COPD (chronic obstructive pulmonary disease) ([place])    on home oxygen  . Depression   . Diabetes mellitus without complication ([place])   . Family history of chronic pain 01/17/2015  . Hiatal hernia   . Hypercholesteremia   . Hypertension   . Illicit drug use   . Lung cancer ([place])    adenocarcinoma left lung  . Migraines   . Sleep apnea   . Spine disorder     SURGICAL HISTORY: Past Surgical History:  Procedure Laterality Date  . ABDOMINAL HYSTERECTOMY    . EYE SURGERY    . HERNIA REPAIR    . PORTACATH PLACEMENT Left 07/14/2015   Procedure: INSERTION PORT-A-CATH;  Surgeon: Nestor Lewandowsky, MD;  Location: [facility] ORS;  Service: General;  Laterality: Left;  . REMOVAL OF PLEURAL DRAINAGE CATHETER N/A 07/18/2015   Procedure: REMOVAL OF PLEURAL DRAINAGE CATHETER;  Surgeon:  Nestor Lewandowsky, MD;  Location: [facility] ORS;  Service: Thoracic;  Laterality: N/A;  . VIDEO ASSISTED THORACOSCOPY (VATS)/THOROCOTOMY Left 07/14/2015   Procedure: VIDEO ASSISTED THORACOSCOPY (VATS), pleural biopsy;  Surgeon: Nestor Lewandowsky, MD;  Location: [facility] ORS;  Service: General;  Laterality: Left;    SOCIAL HISTORY: Social History   Social History  . Marital status: Divorced    Spouse name: N/A  . Number of children: N/A  . Years of education: N/A   Occupational History  . Not on file.   Social History Main Topics  . Smoking status: Former Smoker    Packs/day: 1.50    Years: 45.00    Types: Cigarettes    Quit date: 04/29/2015  . Smokeless tobacco: Never Used     Comment: Quit 2 months ago  . Alcohol use No  . Drug use: Unknown  . Sexual activity: Not on file   Other Topics Concern  . Not on file   Social History Narrative  . No narrative on file    FAMILY HISTORY: Family History  Problem Relation Age of Onset  . Heart disease Mother   . Breast cancer Sister     ALLERGIES:  is allergic to ampicillin and penicillins.  MEDICATIONS:  Current Facility-Administered Medications  Medication Dose Route Frequency Provider Last Rate Last Dose  . acetaminophen (TYLENOL) tablet 650 mg  650 mg Oral Q6H PRN Henreitta Leber, MD       Or  . acetaminophen (TYLENOL) suppository 650 mg  650 mg Rectal Q6H PRN Henreitta Leber, MD      . albuterol (PROVENTIL) (2.5 MG/3ML) 0.083% nebulizer solution 2.5 mg  2.5 mg Inhalation Q6H PRN Henreitta Leber, MD      . apixaban (ELIQUIS) tablet 5 mg  5 mg Oral BID Henreitta Leber, MD   5 mg at 11/01/15 1059  . aspirin EC tablet 81 mg  81 mg Oral Daily Henreitta Leber, MD   81 mg at 11/01/15 1058  . budesonide (PULMICORT) nebulizer solution 0.25 mg  0.25 mg Nebulization BID Henreitta Leber, MD   0.25 mg at 11/01/15 0738  . docusate sodium (COLACE) capsule 100 mg  100 mg Oral BID Lytle Butte, MD   100 mg at 11/01/15 1059  . DULoxetine (CYMBALTA) DR  capsule 90 mg  90 mg Oral Daily Henreitta Leber, MD   90 mg at 11/01/15 1058  . fentaNYL (DURAGESIC - dosed mcg/hr) 75 mcg  75 mcg Transdermal Q72H Cammie Sickle, MD      . folic acid (FOLVITE) tablet 1 mg  1 mg Oral Daily Henreitta Leber, MD   1 mg at 11/01/15 1059  . gabapentin (NEURONTIN) capsule 800 mg  800 mg Oral TID Henreitta Leber, MD   800 mg at 11/01/15 1504  . HYDROcodone-acetaminophen (NORCO/VICODIN) 5-325 MG per tablet 1-2 tablet  1-2 tablet Oral Q4H PRN Henreitta Leber, MD   2 tablet at 11/01/15 1658  . HYDROmorphone (DILAUDID) injection 1.5 mg  1.5 mg Intravenous Q2H PRN Lytle Butte, MD   1.5 mg at 11/01/15 1503  . insulin aspart (novoLOG) injection 0-5 Units  0-5 Units Subcutaneous QHS Henreitta Leber, MD      . insulin aspart (novoLOG) injection 0-9 Units  0-9 Units Subcutaneous TID WC Henreitta Leber, MD   1 Units at 11/01/15 1658  . loratadine (CLARITIN) tablet 10 mg  10 mg Oral Daily Henreitta Leber, MD   10 mg at 11/01/15 1059  . magnesium oxide (MAG-OX) tablet 400 mg  400 mg Oral QHS Henreitta Leber, MD   400 mg at 10/31/15 2222  . metoprolol tartrate (LOPRESSOR) tablet 25 mg  25 mg Oral BID Henreitta Leber, MD   25 mg at 11/01/15 1058  . mirtazapine (REMERON) tablet 45 mg  45 mg Oral QHS Henreitta Leber, MD   45 mg at 10/31/15 2222  . ondansetron (ZOFRAN) tablet 4 mg  4 mg Oral Q6H PRN Henreitta Leber, MD       Or  . ondansetron (ZOFRAN) injection 4 mg  4 mg Intravenous Q6H PRN Henreitta Leber, MD      . pantoprazole (PROTONIX) EC tablet 40 mg  40 mg Oral Daily Henreitta Leber, MD   40 mg at 11/01/15 1058  . polyethylene glycol (MIRALAX / GLYCOLAX) packet 17 g  17 g Oral Daily Lytle Butte, MD   17 g at 11/01/15 1058  . promethazine (PHENERGAN) suppository 25 mg  25 mg Rectal Q6H PRN Henreitta Leber, MD      . simvastatin (ZOCOR) tablet 20 mg  20 mg Oral QHS Henreitta Leber, MD   20 mg at 10/31/15 2222  . traZODone (DESYREL) tablet 150 mg  150 mg Oral QHS Henreitta Leber, MD   150 mg at 10/31/15 2221      .  PHYSICAL EXAMINATION:  Vitals:   11/01/15 0447 11/01/15 1243  BP: Marland Kitchen)  141/79 (!) 69/53  Pulse: (!) 103 70  Resp: 16 16  Temp: 99.6 F (37.6 C) 97.5 F (36.4 C)   Filed Weights   10/31/15 0615  Weight: 155 lb (70.3 kg)    GENERAL: Moderately built moderately nourished female patient; Alert, no distress and comfortable.  Accompanied by family. EYES: no pallor or icterus OROPHARYNX: no thrush or ulceration. NECK: supple, no masses felt LYMPH:  no palpable lymphadenopathy in the cervical, axillary or inguinal regions LUNGS: decreased breath sounds to auscultation at bases and  No wheeze or crackles HEART/CVS: regular rate & rhythm and no murmurs; No lower extremity edema ABDOMEN: abdomen soft, non-tender and normal bowel sounds Musculoskeletal:no cyanosis of digits and no clubbing  PSYCH: alert & oriented x 3 with fluent speech NEURO: no focal motor/sensory deficits SKIN:  no rashes or significant lesions  LABORATORY DATA:  I have reviewed the data as listed Lab Results  Component Value Date   WBC 13.8 (H) 11/01/2015   HGB 11.0 (L) 11/01/2015   HCT 33.3 (L) 11/01/2015   MCV 84.1 11/01/2015   PLT 433 11/01/2015    Recent Labs  10/04/15 0830  10/24/15 0412 10/31/15 0804 11/01/15 0459  NA 135  < > 133* 136 133*  K 4.4  < > 4.0 3.3* 3.4*  CL 103  < > 96* 99* 95*  CO2 26  < > '22 27 28  '$ GLUCOSE 105*  < > 80 116* 91  BUN 14  < > '12 11 10  '$ CREATININE 0.94  < > 0.68 0.54 0.82  CALCIUM 9.5  < > 9.8 10.2 10.0  GFRNONAA >60  < > >60 >60 >60  GFRAA >60  < > >60 >60 >60  PROT 7.9  --  8.2* 7.5  --   ALBUMIN 3.5  --  3.6 3.2*  --   AST 19  --  21 16  --   ALT 15  --  16 11*  --   ALKPHOS 130*  --  125 125  --   BILITOT 0.3  --  1.0 0.5  --   < > = values in this interval not displayed.  RADIOGRAPHIC STUDIES: I have personally reviewed the radiological images as listed and agreed with the findings in the report. Dg  Chest 1 View  Result Date: 10/24/2015 CLINICAL DATA:  60 year old female status post left thoracentesis. EXAM: CHEST 1 VIEW COMPARISON:  Preoperative chest x-ray 10/24/2015 at 4:39 a.m. FINDINGS: Stable position of left subclavian approach single-lumen power injectable port catheter with the catheter tip overlying the mid SVC. Stable extensive left pleural thickening and loculated left lower lobe pleural effusion. No evidence of pneumothorax. Persistent volume loss in the left hemi thorax. The cardiac and mediastinal contours remain unchanged. Aortic atherosclerosis is similar compared to prior. Slightly low inspiratory volumes with right basilar atelectasis. No acute osseous abnormality. Lytic lesion in the lateral aspect of the left seventh rib. IMPRESSION: No evidence of pneumothorax status post left thoracentesis. Electronically Signed   By: Jacqulynn Cadet M.D.   On: 10/24/2015 17:11   Dg Chest 2 View  Result Date: 10/31/2015 CLINICAL DATA:  Shortness of breath and left-sided pain EXAM: CHEST  2 VIEW COMPARISON:  10/24/2015 FINDINGS: Complex left pleural effusion with lower lobe mass and diffuse pleural thickening. No pneumothorax or interval collapse. Clear right lung. Normal heart size and stable mediastinal contours. Porta catheter on the left with tip at the SVC level. IMPRESSION: No acute finding. Left pleural thickening and  loculated fluid is stable compared 10/24/2015. Electronically Signed   By: Monte Fantasia M.D.   On: 10/31/2015 07:03   Dg Chest 2 View  Result Date: 10/24/2015 CLINICAL DATA:  Severe LEFT lower back pain beginning last week, with vomiting and diaphoresis. History of lung cancer, on chemotherapy. EXAM: CHEST  2 VIEW COMPARISON:  Chest radiograph Aug 12, 2015 FINDINGS: Large LEFT pleural effusion, increased from prior examination with underlying consolidation/mass. RIGHT lung is clear. The cardiac silhouette is mildly enlarged. Calcified aortic knob. Single lumen LEFT chest  port with distal tip projecting in proximal superior vena cava. No pneumothorax. Soft tissue planes included osseous structures are nonsuspicious. IMPRESSION: Large LEFT pleural effusion with underlying consolidation/mass. Electronically Signed   By: Elon Alas M.D.   On: 10/24/2015 05:05   Dg Lumbar Spine 2-3 Views  Result Date: 10/15/2015 CLINICAL DATA:  Low back pain EXAM: LUMBAR SPINE - 2-3 VIEW COMPARISON:  None. FINDINGS: Five lumbar type vertebral bodies. Normal lumbar lordosis. No evidence of fracture or dislocation. Vertebral body heights are maintained. Mild multilevel degenerative changes. Visualized bony pelvis appears intact. Tubal ligation clips bilaterally. IMPRESSION: No fracture or dislocation is seen. Mild degenerative changes. Electronically Signed   By: Julian Hy M.D.   On: 10/15/2015 12:08  Ct Chest W Contrast  Result Date: 10/05/2015 CLINICAL DATA:  Primary cancer of left lung metastatic to other site, pt states LL ca diagnosed this spring 2017, left arm swelling and pain since LL surgery 3+mos ago, denies any other new problems/pain. Currently taking chemo. No radiation therapy. No recent trauma. ^ EXAM: CT CHEST WITH CONTRAST TECHNIQUE: Multidetector CT imaging of the chest was performed during intravenous contrast administration. CONTRAST:  92m ISOVUE-300 IOPAMIDOL (ISOVUE-300) INJECTION 61% COMPARISON:  CT 5 6,017 FINDINGS: Cardiovascular: Coronary artery calcification and aortic atherosclerotic calcification. Mediastinum/Nodes: Lymph node anterior to the main pulmonary artery measuring 11 mm (image 24, series 2). Lungs/Pleura: Rounded mass in the LEFT lower lobe measures 4.6 x 4.6 cm compared to 4.6 x 4.7 cm. Loculated fluid in the LEFT lower lobe is similar prior. Nodule in the posterior aspect of the LEFT upper lobe measuring 10 mm is decreased from 12 mm (image 45, series 3). In the RIGHT lung, a small nodule with a tiny central lucency measures 5 mm in the  azygos esophageal recess (image 20 14, series 3) is not changed. Upper Abdomen: Limited view of the liver, kidneys, pancreas are unremarkable. Normal adrenal glands. Musculoskeletal: Some contraction of lytic lesion in the posterior RIGHT fifth rib measuring 17 mm decreased from 21 mm. Insert new from prior (sagittal image 83) is new from prior. IMPRESSION: 1.  LEFT lower lobe mass is not changed significantly in size. 2. Potential new mediastinal lymph node anterior to the main pulmonary artery. 3. New lytic lesion in the T8 vertebral body is concerning for skeletal metastasis. Consider FDG PET-CT scan for restaging. Electronically Signed   By: SSuzy BouchardM.D.   On: 10/05/2015 12:07   Ct Angio Chest Pe W/cm &/or Wo Cm  Result Date: 10/31/2015 CLINICAL DATA:  60year old female with known carcinoma, metastatic. History of lung carcinoma New complaints of back pain in chills generalized body aches and fevers. EXAM: CT CHEST, ABDOMEN, AND PELVIS WITH CONTRAST TECHNIQUE: Multidetector CT imaging of the chest, abdomen and pelvis was performed following the standard protocol during bolus administration of intravenous contrast. CONTRAST:  85 cc Isovue 370 COMPARISON:  CT 10/05/2015, 07/29/2015 FINDINGS: CT CHEST FINDINGS Port catheter on the left  chest wall via subclavian vein. No axillary or supraclavicular adenopathy. Unremarkable appearance of the thoracic inlet, including the visualized thyroid. Several mediastinal lymph nodes present, similar to comparison CT. None of these are enlarged by CT size criteria. Small hiatal hernia. Calcifications of the ascending and descending thoracic aorta. No dissection. No periaortic fluid. No aneurysm. Calcifications of the branch vessels. No central, lobar, segmental, or proximal subsegmental filling defects to indicate pulmonary emboli. Heart size unchanged, within normal limits. Pericardial fluid along the left lateral pericardium associated with the left-sided pleural  disease extending into the mediastinum. Soft tissue extends from the left-sided pleural disease to the posterior aspect of the left lateral ventricle, with poor visualization of the pericardium. Calcifications of native coronary vessels including left main, left anterior descending, circumflex, right coronary arteries. Similar appearance of circumferential soft tissue/fluid of the left pleura, with complete involvement of the parietal pleura. Left upper lobe relatively well aerated, with increasing atelectasis/scarring of the lingula. Complete collapse of the left lower lobe again with the greatest diameter of mass measuring 4.3 cm x 4.9 cm, similar to the comparison CT. Moderate volume of pleural fluid at the left base. Loss of volume on the left contributes to slight right to left shift of the mediastinal structures. No right-sided confluent airspace disease. No pleural effusion. Nodule within the superior segment of the right lower lobe measures 8 -9 mm, unchanged from the comparison CT. Small nodule within the posterior aspect of the right upper lobe (image 35). Small nodule within the fissure on image 33. Nodules are new from the CT dated 07/29/2015. Musculoskeletal: Lucent lesions involving the left third rib, fifth rib, seventh rib, eighth rib, ninth rib, tenth rib. Lucent lesions involving the right fourth rib, fifth rib, sixth rib, ninth rib, tenth rib. Pathologic fracture at the posterior tenth rib. Progressing lucent lesions of the manubrium, sternum, as well as the thoracic vertebral bodies of T2, T3, T4, T5, T7, T8, T9, T10, T12. CT ABDOMEN PELVIS FINDINGS Abdomen/pelvis: Unremarkable appearance of liver and spleen. Unremarkable appearance of bilateral adrenal glands. No peripancreatic or pericholecystic fluid or inflammatory changes. No radio-opaque gallstones. No intrahepatic or extrahepatic biliary ductal dilatation. No intra-peritoneal free air or significant free-fluid. Diverticular disease without  associated inflammatory changes. Appendix is not visualized, however, no inflammatory changes are present adjacent to the cecum to indicate an appendicitis. Circumferential thickening of the soft tissue involving the antrum of the stomach in the pylorus. No inflammatory changes of the mesenteric fat. No transition point or dilated bowel. Right Kidney/Ureter: No hydronephrosis. No nephrolithiasis. No perinephric stranding. Unremarkable course of the right ureter. Left Kidney/Ureter: No hydronephrosis. No nephrolithiasis. No perinephric stranding. Unremarkable course of the left ureter. Cystic lesion at the inferior left kidney again noted. Unremarkable appearance of the urinary bladder. Hysterectomy. Calcifications of the abdominal aorta and iliac vasculature. Musculoskeletal: Interval progression of disease of the lumbar spine with new metastatic lucent lesions involving L1, L2, L3, L4, and L5. New lesions of the bilateral sacrum, sacral base, and bilateral pelvic bones. New lesions involving the proximal femurs. No significant degenerative changes of the spine. IMPRESSION: Significant progression of skeletal metastatic disease, with new and enlarging lesions throughout the majority of the thoracic and lumbar spine, bilateral ribs, sternum, sacrum, bilateral pelvis, left humerus, left scapula, and bilateral femurs. Pathologic fracture of the posterior right tenth rib, as well as the left third rib and left seventh rib. Similar appearance of left thoracic metastatic disease with circumferential soft tissue/fluid of the parietal and visceral  pleura, complete volume loss of the left lower lobe, small pleural effusion, and invasion of the mediastinal structures. Small nodules of the right upper lobe and left lower lobe, concerning for progression of pulmonary Mets in the right chest. Circumferential soft tissue thickening in the distal stomach at the pylorus. This may represent gastritis, and correlation with symptoms  and potentially upper endoscopy may be considered. Signed, Dulcy Fanny. Earleen Newport, DO [facility] Electronically Signed   By: Corrie Mckusick D.O.   On: 10/31/2015 12:34   Mr Jeri Cos GB Contrast  Result Date: 11/01/2015 CLINICAL DATA:  Metastatic lung cancer. EXAM: MRI HEAD WITHOUT AND WITH CONTRAST TECHNIQUE: Multiplanar, multiecho pulse sequences of the brain and surrounding structures were obtained without and with intravenous contrast. CONTRAST:  12m MULTIHANCE GADOBENATE DIMEGLUMINE 529 MG/ML IV SOLN COMPARISON:  08/14/2015 FINDINGS: Interval development of multiple enhancing bone lesions compatible with metastatic disease. There is a 15 mm lesion in the right clivus which was not present previously. This may be eroding the floor of the sella. No significant invasion of the cavernous sinus. 15 mm enhancing lesion in the right parietal bone is new. Smaller enhancing lesions in the parietal bone bilaterally all compatible with metastatic disease. Postcontrast imaging demonstrates mild motion degrading image quality. Allowing for this, no metastatic disease is present in the brain. No enhancing mass lesion in the brain. Leptomeningeal enhancement normal. Ventricle size normal. Cerebral volume normal. Negative for acute infarct. Mild chronic microvascular ischemic change in the white matter similar to the prior study. Mild mucosal edema in the paranasal sinuses. No orbital mass lesion. IMPRESSION: Interval development of metastatic disease to the skull and skullbase compared with the MRI of 08/14/2015. Findings indicate rapid tumor growth. Negative for metastatic disease to the brain. Electronically Signed   By: CFranchot GalloM.D.   On: 11/01/2015 11:26   Ct Abdomen Pelvis W Contrast  Result Date: 10/31/2015 CLINICAL DATA:  60year old female with known carcinoma, metastatic. History of lung carcinoma New complaints of back pain in chills generalized body  aches and fevers. EXAM: CT CHEST, ABDOMEN, AND PELVIS WITH CONTRAST TECHNIQUE: Multidetector CT imaging of the chest, abdomen and pelvis was performed following the standard protocol during bolus administration of intravenous contrast. CONTRAST:  85 cc Isovue 370 COMPARISON:  CT 10/05/2015, 07/29/2015 FINDINGS: CT CHEST FINDINGS Port catheter on the left chest wall via subclavian vein. No axillary or supraclavicular adenopathy. Unremarkable appearance of the thoracic inlet, including the visualized thyroid. Several mediastinal lymph nodes present, similar to comparison CT. None of these are enlarged by CT size criteria. Small hiatal hernia. Calcifications of the ascending and descending thoracic aorta. No dissection. No periaortic fluid. No aneurysm. Calcifications of the branch vessels. No central, lobar, segmental, or proximal subsegmental filling defects to indicate pulmonary emboli. Heart size unchanged, within normal limits. Pericardial fluid along the left lateral pericardium associated with the left-sided pleural disease extending into the mediastinum. Soft tissue extends from the left-sided pleural disease to the posterior aspect of the left lateral ventricle, with poor visualization of the pericardium. Calcifications of native coronary vessels including left main, left anterior descending, circumflex, right coronary arteries. Similar appearance of circumferential soft tissue/fluid of the left pleura, with complete involvement of the parietal pleura. Left upper lobe relatively well aerated, with increasing atelectasis/scarring of the lingula. Complete collapse of the left lower lobe again with the greatest diameter of mass measuring 4.3 cm x 4.9 cm, similar to the comparison CT. Moderate volume of  pleural fluid at the left base. Loss of volume on the left contributes to slight right to left shift of the mediastinal structures. No right-sided confluent airspace disease. No pleural effusion. Nodule within the  superior segment of the right lower lobe measures 8 -9 mm, unchanged from the comparison CT. Small nodule within the posterior aspect of the right upper lobe (image 35). Small nodule within the fissure on image 33. Nodules are new from the CT dated 07/29/2015. Musculoskeletal: Lucent lesions involving the left third rib, fifth rib, seventh rib, eighth rib, ninth rib, tenth rib. Lucent lesions involving the right fourth rib, fifth rib, sixth rib, ninth rib, tenth rib. Pathologic fracture at the posterior tenth rib. Progressing lucent lesions of the manubrium, sternum, as well as the thoracic vertebral bodies of T2, T3, T4, T5, T7, T8, T9, T10, T12. CT ABDOMEN PELVIS FINDINGS Abdomen/pelvis: Unremarkable appearance of liver and spleen. Unremarkable appearance of bilateral adrenal glands. No peripancreatic or pericholecystic fluid or inflammatory changes. No radio-opaque gallstones. No intrahepatic or extrahepatic biliary ductal dilatation. No intra-peritoneal free air or significant free-fluid. 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 Appendix is not visualized, however, no inflammatory changes are present adjacent to the cecum to indicate an appendicitis. Circumferential thickening of the soft tissue involving the antrum of the stomach in the pylorus. No inflammatory changes of the mesenteric fat. No transition point or dilated bowel. Right Kidney/Ureter: No hydronephrosis. No nephrolithiasis. No perinephric stranding. Unremarkable course of the right ureter. Left Kidney/Ureter: No hydronephrosis. No nephrolithiasis. No perinephric stranding. Unremarkable course of the left ureter. Cystic lesion at the inferior left kidney again noted. Unremarkable appearance of the urinary bladder. Hysterectomy. Calcifications of the abdominal aorta and iliac vasculature. Musculoskeletal: Interval progression of disease of the lumbar spine with new metastatic lucent lesions involving L1, L2, L3, L4, and L5. New  lesions of the bilateral sacrum, sacral base, and bilateral pelvic bones. New lesions involving the proximal femurs. No significant degenerative changes of the spine. IMPRESSION: Significant progression of skeletal metastatic disease, with new and enlarging lesions throughout the majority of the thoracic and lumbar spine, bilateral ribs, sternum, sacrum, bilateral pelvis, left humerus, left scapula, and bilateral femurs. Pathologic fracture of the posterior right tenth rib, as well as the left third rib and left seventh rib. Similar appearance of left thoracic metastatic disease with circumferential soft tissue/fluid of the parietal and visceral pleura, complete volume loss of the left lower lobe, small pleural effusion, and invasion of the mediastinal structures. Small nodules of the right upper lobe and left lower lobe, concerning for progression of pulmonary Mets in the right chest. Circumferential soft tissue thickening in the distal stomach at the pylorus. This may represent gastritis, and correlation with symptoms and potentially upper endoscopy may be considered. Signed, Dulcy Fanny. Earleen Newport, DO [facility] Radiology Electronically Signed   By: Corrie Mckusick D.O.   On: 10/31/2015 12:34   US Venous Img Upper Uni Left  Result Date: 10/05/2015 CLINICAL DATA:  Left upper extremity swelling for several months. EXAM: Left UPPER EXTREMITY VENOUS DOPPLER ULTRASOUND TECHNIQUE: Gray-scale sonography with graded compression, as well as color Doppler and duplex ultrasound were performed to evaluate the upper extremity deep venous system from the level of the subclavian vein and including the jugular, axillary, basilic, radial, ulnar and upper cephalic vein. Spectral Doppler was utilized to evaluate flow at rest and with distal augmentation maneuvers. COMPARISON:  None. FINDINGS: Contralateral Subclavian Vein: Respiratory phasicity is normal and symmetric with the  symptomatic side. No evidence of thrombus. Normal compressibility. Internal Jugular Vein: No evidence of thrombus. Normal compressibility, respiratory phasicity and response to augmentation. Subclavian Vein: No evidence of thrombus. Normal compressibility, respiratory phasicity and response to augmentation. Axillary Vein: No evidence of thrombus. Normal compressibility, respiratory phasicity and response to augmentation. Cephalic Vein: No evidence of thrombus. Normal compressibility, respiratory phasicity and response to augmentation. Basilic Vein: No evidence of thrombus. Normal compressibility, respiratory phasicity and response to augmentation. Brachial Veins: No evidence of thrombus. Normal compressibility, respiratory phasicity and response to augmentation. Radial Veins: No evidence of thrombus. Normal compressibility, respiratory phasicity and response to augmentation. Ulnar Veins: No evidence of thrombus. Normal compressibility, respiratory phasicity and response to augmentation. Venous Reflux:  None visualized. Other Findings:  None visualized. IMPRESSION: No evidence of venous thrombosis seen in left upper extremity. Electronically Signed   By: Marijo Conception, M.D.   On: 10/05/2015 11:49   Dg Abd Acute W/chest  Result Date: 10/31/2015 CLINICAL DATA:  Generalized body aches, sick to her stomach, mid back pain, history lung cancer, COPD, diabetes mellitus, hypertension, smoking EXAM: DG ABDOMEN ACUTE W/ 1V CHEST COMPARISON:  Earlier chest radiograph of 10/31/2015, abdominal radiograph 07/24/2012 FINDINGS: LEFT subclavian Port-A-Cath with tip projecting over SVC. Enlargement of cardiac silhouette with slight vascular congestion. Atherosclerotic calcification aorta. Persistent LEFT pleural effusion and LEFT lung atelectasis greatest at bases. Minimal atelectasis RIGHT lung base with RIGHT lung otherwise clear. No pneumothorax. Bones demineralized. Nonobstructive bowel gas pattern. Air-filled normal upper  normal caliber loops of small bowel in the LEFT mid abdomen. No bowel dilatation, bowel wall thickening, or free air. Surgical clips in pelvis bilaterally. No urinary tract calcification definitely visualized. IMPRESSION: Nonobstructive bowel gas pattern. Persistent LEFT pleural effusion and LEFT lung atelectasis greatest at LEFT base. Electronically Signed   By: Lavonia Dana M.D.   On: 10/31/2015 08:11   US Thoracentesis Asp Pleural Space W/img Guide  Result Date: 10/24/2015 INDICATION: 60 year old female with history of left lung cancer and persistent loculated left pleural effusion. EXAM: ULTRASOUND GUIDED LEFT THORACENTESIS MEDICATIONS: None. COMPLICATIONS: None immediate. PROCEDURE: An ultrasound guided thoracentesis was thoroughly discussed with the patient and questions answered. The benefits, risks, alternatives and complications were also discussed. The patient understands and wishes to proceed with the procedure. Written consent was obtained. Ultrasound was performed to localize and mark an adequate pocket of fluid in the left chest. The area was then prepped and draped in the normal sterile fashion. 1% Lidocaine was used for local anesthesia. Under ultrasound guidance a 19 gauge, 7-cm, Yueh catheter was introduced. Thoracentesis was performed. The catheter was removed and a dressing applied. FINDINGS: A total of approximately 70 mL of reddish brown pleural fluid was removed. Samples were sent to the laboratory as requested by the clinical team. IMPRESSION: Successful ultrasound guided left thoracentesis yielding 70 mL of pleural fluid. Of note, the left basilar pleural effusion is loculated and highly complex. Further aspiration is unlikely to yield significantly more fluid. Electronically Signed   By: Jacqulynn Cadet M.D.   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   # Pain secondary to neoplasm/multiple bony metastases. Recommend increasing the fentanyl patch to 75 g. Continue breakthrough pain medication. Also recommend Zometa for malignancy related  pain. Spoke to Dr. Donella Stade; plans to recommend palliative radiation.    # Had a long discussion with the patient/daughter family-pastor regarding the poor prognosis. Patient states that she would want to "continue treatments". Given her declining performance status; refractory disease to first line treatments- her tolerance to chemotherapy would be poor; and also the response rates to second line systemic therapy would be very low.    I reviewed the images myself and with the patient and family in detail. Discussed with Dr.Hower.   Thank you Dr. Lavetta Nielsen for allowing me to participate in the care of your pleasant patient. Please do not hesitate to contact me with questions or concerns in the interim.    Cammie Sickle, MD 11/01/2015 6:08 PM

## 2015-11-02 ENCOUNTER — Ambulatory Visit
Admission: RE | Admit: 2015-11-02 | Discharge: 2015-11-02 | Disposition: A | Discharge status: Home / Self Care | Payer: Medicare Other | Source: Ambulatory Visit | Attending: Radiation Oncology | Admitting: Radiation Oncology

## 2015-11-02 DIAGNOSIS — C7951 Secondary malignant neoplasm of bone: Secondary | ICD-10-CM | POA: Insufficient documentation

## 2015-11-02 DIAGNOSIS — Z510 Encounter for antineoplastic radiation therapy: Secondary | ICD-10-CM | POA: Insufficient documentation

## 2015-11-02 DIAGNOSIS — C3492 Malignant neoplasm of unspecified part of left bronchus or lung: Secondary | ICD-10-CM | POA: Insufficient documentation

## 2015-11-02 LAB — GLUCOSE, CAPILLARY
GLUCOSE-CAPILLARY: 95 mg/dL (ref 65–99)
Glucose-Capillary: 112 mg/dL — ABNORMAL HIGH (ref 65–99)
Glucose-Capillary: 91 mg/dL (ref 65–99)
Glucose-Capillary: 94 mg/dL (ref 65–99)

## 2015-11-02 MED ORDER — HYDROMORPHONE HCL 2 MG PO TABS
2.0000 mg | ORAL_TABLET | ORAL | Status: DC | PRN
  Administered 2015-11-02 (×2): 2 mg via ORAL
  Filled 2015-11-02 (×2): qty 1

## 2015-11-02 MED ORDER — METHYLNALTREXONE BROMIDE 12 MG/0.6ML ~~LOC~~ SOLN
12.0000 mg | Freq: Once | SUBCUTANEOUS | Status: AC
  Administered 2015-11-02: 12 mg via SUBCUTANEOUS
  Filled 2015-11-02: qty 0.6

## 2015-11-02 MED ORDER — BISACODYL 10 MG RE SUPP
10.0000 mg | Freq: Every day | RECTAL | Status: DC
  Administered 2015-11-02: 10 mg via RECTAL
  Filled 2015-11-02: qty 1

## 2015-11-02 MED ORDER — HYDROMORPHONE HCL 2 MG PO TABS: 2.0000 mg | ORAL_TABLET | ORAL | 0 refills | Status: AC | PRN

## 2015-11-02 MED ORDER — FENTANYL 75 MCG/HR TD PT72: 75.0000 ug | MEDICATED_PATCH | TRANSDERMAL | 0 refills | Status: AC

## 2015-11-02 MED ORDER — DOCUSATE SODIUM 100 MG PO CAPS: 100.0000 mg | ORAL_CAPSULE | Freq: Two times a day (BID) | ORAL | 0 refills | Status: AC

## 2015-11-02 MED ORDER — POLYETHYLENE GLYCOL 3350 17 G PO PACK: 17.0000 g | PACK | Freq: Every day | ORAL | 0 refills | Status: DC

## 2015-11-02 NOTE — Discharge Instructions (Signed)
Bone Metastasis Bone metastasis is cancer that spreads to the bones from another part of the body. A person may have bone metastasis in one bone or in more than one bone. Cancer that spreads to the bones is different from cancer that starts in the bones (primary bone cancer). Bone metastasis is more common than primary bone cancer. The spine is the most common area for bone metastasis. Other common areas include:  Hip bone (pelvis).  Ribs.  Skull.  Long bones of the arm or leg. Bone metastasis is painful, and it damages the bones. Bone metastasis damages and weakens bones in two ways. A person may have bone destruction (osteolytic damage) or abnormal bone growth (osteoblastic destruction). Both of these conditions can make bones so weak that they break (pathologic fracture) even from a minor injury. CAUSES This condition is caused by cancer cells that spread to bone. These cells can get into your bloodstream and spread through your body. They can also get into the vessels that are part of your lymphatic system (lymph vessels) and spread that way. RISK FACTORS This condition is more likely to develop in people who have an advanced type of cancer that is known to spread to bone. Cancers that often spread to bone include:  Breast cancer.  Prostate cancer.  Lung cancer.  Thyroid cancer.  Kidney cancer. SYMPTOMS The most common symptom of this condition is bone pain, especially while you are resting. Other symptoms include:  A broken bone (fracture) that happens with little or no trauma.  Low number of red blood cells (anemia). Bone destruction may damage the spongy tissue (bone marrow) in the center of some bones where red blood cells are produced. Anemia can cause:  Weakness.  Shortness of breath.  Headache.  Dizziness.  Back or neck pain with numbness or weakness, especially if you have bone metastasis in your spine.  High levels of calcium in your blood (hypercalcemia).  When bone is destroyed, calcium is released into your blood. Symptoms of hypercalcemia include:  Constipation.  Thirst.  Nausea.  Sleepiness. DIAGNOSIS This condition may be diagnosed based on:  Your symptoms and medical history. Your health care provider may suspect this condition if you are being treated for cancer or have had cancer treatment in the past.  A physical exam.  Imaging studies, such as:  Bone X-rays, especially in the area where you have pain.  CT scan.  Bone scan.  MRI.  Blood tests to check for anemia or hypercalcemia.  A procedure to remove a piece of bone so it can be examined under a microscope (biopsy). TREATMENT Treatment for this condition depends on your overall health, the type of cancer you have, and how much the cancer has spread. You will work with a team of health care providers to determine which treatment is best for you. Treatment will focus on managing pain, preventing bone weakness, and slowing the spread of the cancer. Treatment may include:  Radiation therapy. This treatment uses X-rays to kill cancer cells. It is most effective for reducing pain, stopping tumor growth, and lowering the risk of fractures.  Radioisotope therapy. This treatment uses a radioactive medicine that is injected into your blood. The medicine travels to areas where cancer cells are active and kills them.  Chemotherapy. For this treatment, you are given cancer-killing drugs. You may have chemotherapy in cycles, with rest periods in between.  Medicines to block cells that destroy bone (bisphosphonates and denosumab). These medicines are used to control bone  pain. They may help to reduce hypercalcemia.  Medicines to reduce pain (opiates).  Endocrine therapies. These therapies slow cancer growth by blocking specific chemical messengers (hormones). Some types of cancer, including breast and prostate cancers, depend on hormones.  Targeted therapies. These therapies  involve the use of drugs that block the growth and spread of cancer. This treatment is different from standard chemotherapy.  Immunotherapies. These therapies use the body's defense system (immune system) to fight cancer cells.  Surgery. You may have surgery to remove bone cancer or to prevent or repair a fracture. HOME CARE INSTRUCTIONS  Take medicines only as directed by your health care provider.  Do not drive or operate heavy machinery while taking pain medicine.  Take the following steps to help prevent constipation, which can result from some pain medicines.  Include plenty of fruits and whole grains in your diet.  Drink enough fluid to keep your urine clear or pale yellow.  Ask your health care provider about taking a laxative if needed.  Keep all follow-up visits as directed by your health care provider. This is important. SEEK MEDICAL CARE IF:  Your pain medicine is not helping.  You are too weak to care for yourself at home. SEEK IMMEDIATE MEDICAL CARE IF:  You fall and have pain or an injury.  You have trouble walking.  You have numbness or tingling in your legs.  Your pain suddenly gets worse.  You lose control of your bowels or your bladder.  You are very sleepy or confused.   This information is not intended to replace advice given to you by your health care provider. Make sure you discuss any questions you have with your health care provider.   Document Released: 03/01/2002 Document Revised: 07/26/2014 Document Reviewed: 01/12/2014 Elsevier Interactive Patient Education 2016 [employer]. Use stool softners regularly Use your oxygen as before

## 2015-11-02 NOTE — Progress Notes (Signed)
Pt's family has the signature sheet from the discharge paperwork. Aniceto Boss, the daughter, was contacted, will bring midday tomorrow. Contact info is [phone].

## 2015-11-02 NOTE — Care Management Important Message (Signed)
Important Message  Patient Details  Name: Charlene Williams MRN: [id number] Date of Birth: 22-Aug-1955   Medicare Important Message Given:  Yes    Katrina Stack, RN 11/02/2015, 3:49 PM

## 2015-11-02 NOTE — Progress Notes (Signed)
Pt being discharged and rolled out in wheelchair at this time. Pt educated about outpatient radiation treatment, medications she needs to get from the pharmacy. Prescriptions for Fentanyl and Dilaudid given to patient's family. Home oxygen tank hooked up for transfer. All patients belongings taken.

## 2015-11-02 NOTE — Discharge Summary (Signed)
[facility] NAME: Charlene Williams    MR#:  [id number]  DATE OF BIRTH:  January 12, 1956  DATE OF ADMISSION:  10/31/2015 ADMITTING PHYSICIAN: Henreitta Leber, MD  DATE OF DISCHARGE: 11/02/15  PRIMARY CARE PHYSICIAN: HOLLAND, [place], NP    ADMISSION DIAGNOSIS:  Metastatic cancer ([place]) [C80.1] Metastatic carcinoma ([place]) [C79.9] Uncontrolled pain [R52]  DISCHARGE DIAGNOSIS:  Metastatic lung cancer Generalized bone pain due to metastasis  SECONDARY DIAGNOSIS:   Past Medical History:  Diagnosis Date  . Aneurysm ([place])   . Chest pain, pleuritic   . COPD (chronic obstructive pulmonary disease) ([place])    on home oxygen  . Depression   . Diabetes mellitus without complication ([place])   . Family history of chronic pain 01/17/2015  . Hiatal hernia   . Hypercholesteremia   . Hypertension   . Illicit drug use   . Lung cancer ([place])    adenocarcinoma left lung  . Migraines   . Sleep apnea   . Spine disorder     HOSPITAL COURSE:   Charlene Williams is a 60 y.o. female presenting with Generalized Body Aches; Back Pain; and Chills  Admitted 10/31/2015 : Day # 2 day 1. Intractable pain: At home patient does receive 5 mg Percocets and a fentanyl patch pain has improved somewhat on IV Dilaudid however does not completely resolve pain and pain returns prior to next dosage. Pain secondary to metastatic disease.  -pt currently on 75 mcg of fentanyl and dilaudid 2 mg po q 4prn (d/w dr Wilburn Mylar)  2. Metastatic lung cancer-prognosis is very poor given her extensive metastatic disease. -appreciate consult with oncology. - MRI of the brain with shows mets to skull. No brain mets -pt to get radiation mapping done today and start palliative RT from next week  3. Neuropathy-continue gabapentin.  4. Depression-continue Cymbalta.  5. Diabetes type 2 non-insulin-requiring Place on sliding scale insulin.  6. Essential hypertension-continue  metoprolol.  7. Hyperlipidemia unspecified continue simvastatin.  8. Hx of PE - cont. Eliquis.  9. Constipation due to narcotics Try relistor Dulcolax and enema  today  Will d/c pt home later today CONSULTS OBTAINED:  Treatment Team:  Cammie Sickle, MD  DRUG ALLERGIES:   Allergies  Allergen Reactions  . Ampicillin Hives and Other (See Comments)    Has patient had a PCN reaction causing immediate rash, facial/tongue/throat swelling, SOB or lightheadedness with hypotension: No Has patient had a PCN reaction causing severe rash involving mucus membranes or skin necrosis: No Has patient had a PCN reaction that required hospitalization No Has patient had a PCN reaction occurring within the last 10 years: Yes If all of the above answers are "NO", then may proceed with Cephalosporin use.  Marland Kitchen Penicillins Hives and Other (See Comments)    Has patient had a PCN reaction causing immediate rash, facial/tongue/throat swelling, SOB or lightheadedness with hypotension: No Has patient had a PCN reaction causing severe rash involving mucus membranes or skin necrosis: No Has patient had a PCN reaction that required hospitalization No Has patient had a PCN reaction occurring within the last 10 years: Yes If all of the above answers are "NO", then may proceed with Cephalosporin use.    DISCHARGE MEDICATIONS:   Current Discharge Medication List    START taking these medications   Details  docusate sodium (COLACE) 100 MG capsule Take 1 capsule (100 mg total) by mouth 2 (two) times daily. Qty: 10 capsule, Refills: 0  HYDROmorphone (DILAUDID) 2 MG tablet Take 1 tablet (2 mg total) by mouth every 4 (four) hours as needed for severe pain. Qty: 30 tablet, Refills: 0    polyethylene glycol (MIRALAX / GLYCOLAX) packet Take 17 g by mouth daily. Qty: 14 each, Refills: 0      CONTINUE these medications which have CHANGED   Details  fentaNYL (DURAGESIC - DOSED MCG/HR) 75 MCG/HR Place 1  patch (75 mcg total) onto the skin every 3 (three) days. Qty: 5 patch, Refills: 0   Associated Diagnoses: Metastatic lung cancer (metastasis from lung to other site), unspecified laterality ([place]); Chronic pain syndrome      CONTINUE these medications which have NOT CHANGED   Details  albuterol (PROVENTIL HFA;VENTOLIN HFA) 108 (90 Base) MCG/ACT inhaler Inhale 2 puffs into the lungs every 6 (six) hours as needed for wheezing or shortness of breath. Qty: 1 Inhaler, Refills: 2    alendronate (FOSAMAX) 70 MG tablet Take 70 mg by mouth once a week. Pt takes on Tuesday.   Take with a full glass of water on an empty stomach.    apixaban (ELIQUIS) 5 MG TABS tablet Take 1 tablet (5 mg total) by mouth 2 (two) times daily. Qty: 60 tablet, Refills: 1    aspirin EC 81 MG tablet Take 81 mg by mouth daily.    cetirizine (ZYRTEC) 10 MG tablet Take 10 mg by mouth at bedtime.    DULoxetine (CYMBALTA) 30 MG capsule Take 90 mg by mouth daily.     fluticasone (FLOVENT HFA) 220 MCG/ACT inhaler Inhale 1 puff into the lungs 2 (two) times daily.    folic acid (FOLVITE) 1 MG tablet Take 1 mg by mouth daily.    gabapentin (NEURONTIN) 800 MG tablet Take 800 mg by mouth 3 (three) times daily.   Associated Diagnoses: Primary cancer of left lower lobe of lung (HCC)    HYDROcodone-acetaminophen (NORCO/VICODIN) 5-325 MG tablet Take 1-2 tablets by mouth every 4 (four) hours as needed for moderate pain. Qty: 30 tablet, Refills: 0    lidocaine-prilocaine (EMLA) cream Apply 1 application topically as needed (prior to accessing port).    magnesium oxide (MAG-OX) 400 (241.3 Mg) MG tablet Take 400 mg by mouth at bedtime.    metFORMIN (GLUCOPHAGE-XR) 750 MG 24 hr tablet Take 750 mg by mouth daily with breakfast.    metoprolol tartrate (LOPRESSOR) 25 MG tablet Take 1 tablet (25 mg total) by mouth 2 (two) times daily. Qty: 60 tablet, Refills: 0    mirtazapine (REMERON) 45 MG tablet Take 45 mg by mouth at bedtime.     omeprazole (PRILOSEC) 20 MG capsule Take 20 mg by mouth daily before breakfast.     ondansetron (ZOFRAN) 8 MG tablet Take 8 mg by mouth every 8 (eight) hours as needed for nausea or vomiting.    prochlorperazine (COMPAZINE) 10 MG tablet TAKE 1 TABLET ('10MG'$ ) BY MOUTH EVERY SIX HOURS AS NEEDED FOR NAUSEA OR VOMITING Qty: 30 tablet, Refills: 1    promethazine (PHENERGAN) 25 MG suppository Place 1 suppository (25 mg total) rectally every 6 (six) hours as needed for nausea or vomiting. Qty: 12 each, Refills: 0    simvastatin (ZOCOR) 20 MG tablet Take 20 mg by mouth at bedtime.     traZODone (DESYREL) 150 MG tablet Take 150 mg by mouth at bedtime.        If you experience worsening of your admission symptoms, develop shortness of breath, life threatening emergency, suicidal or homicidal thoughts you must  seek medical attention immediately by calling 911 or calling your MD immediately  if symptoms less severe.  You Must read complete instructions/literature along with all the possible adverse reactions/side effects for all the Medicines you take and that have been prescribed to you. Take any new Medicines after you have completely understood and accept all the possible adverse reactions/side effects.   Please note  You were cared for by a hospitalist during your hospital stay. If you have any questions about your discharge medications or the care you received while you were in the hospital after you are discharged, you can call the unit and asked to speak with the hospitalist on call if the hospitalist that took care of you is not available. Once you are discharged, your primary care physician will handle any further medical issues. Please note that NO REFILLS for any discharge medications will be authorized once you are discharged, as it is imperative that you return to your primary care physician (or establish a relationship with a primary care physician if you do not have one) for your aftercare  needs so that they can reassess your need for medications and monitor your lab values. Today   SUBJECTIVE  Abdominal pain due to constipation. No bm per pt since 10 days. Last Bm documented per RN on 10/30/15  VITAL SIGNS:  Blood pressure (!) 110/56, pulse 76, temperature 98.5 F (36.9 C), temperature source Oral, resp. rate 18, height '5\' 3"'$  (1.6 m), weight 70.3 kg (155 lb), SpO2 100 %.  I/O:   Intake/Output Summary (Last 24 hours) at 11/02/15 0828 Last data filed at 11/01/15 1849  Gross per 24 hour  Intake              740 ml  Output              150 ml  Net              590 ml    PHYSICAL EXAMINATION:  GENERAL:  60 y.o.-year-old patient lying in the bed with no acute distress.  EYES: Pupils equal, round, reactive to light and accommodation. No scleral icterus. Extraocular muscles intact.  HEENT: Head atraumatic, normocephalic. Oropharynx and nasopharynx clear.  NECK:  Supple, no jugular venous distention. No thyroid enlargement, no tenderness.  LUNGS: Normal breath sounds bilaterally, no wheezing, rales,rhonchi or crepitation. No use of accessory muscles of respiration.  CARDIOVASCULAR: S1, S2 normal. No murmurs, rubs, or gallops.  ABDOMEN: Soft, non-tender, non-distended. Bowel sounds present. No organomegaly or mass.  EXTREMITIES: No pedal edema, cyanosis, or clubbing.  NEUROLOGIC: Cranial nerves II through XII are intact. Muscle strength 5/5 in all extremities. Sensation intact. Gait not checked.  PSYCHIATRIC: The patient is alert and oriented x 3.  SKIN: No obvious rash, lesion, or ulcer.   DATA REVIEW:   CBC   Recent Labs Lab 11/01/15 0459  WBC 13.8*  HGB 11.0*  HCT 33.3*  PLT 433    Chemistries   Recent Labs Lab 10/31/15 0804 11/01/15 0459  NA 136 133*  K 3.3* 3.4*  CL 99* 95*  CO2 27 28  GLUCOSE 116* 91  BUN 11 10  CREATININE 0.54 0.82  CALCIUM 10.2 10.0  AST 16  --   ALT 11*  --   ALKPHOS 125  --   BILITOT 0.5  --     Microbiology Results    Recent Results (from the past 240 hour(s))  Body fluid culture     Status: None   Collection  Time: 10/24/15  2:45 PM  Result Value Ref Range Status   Specimen Description PLEURAL  Final   Special Requests NONE  Final   Gram Stain   Final    RARE WBC PRESENT,BOTH PMN AND MONONUCLEAR NO ORGANISMS SEEN    Culture   Final    NO GROWTH 3 DAYS Performed at [facility]    Report Status 10/28/2015 FINAL  Final  Urine culture     Status: None   Collection Time: 10/31/15  6:29 AM  Result Value Ref Range Status   Specimen Description URINE, RANDOM  Final   Special Requests NONE  Final   Culture NO GROWTH Performed at [facility]   Final   Report Status 11/01/2015 FINAL  Final  Culture, blood (routine x 2)     Status: None (Preliminary result)   Collection Time: 10/31/15 11:07 AM  Result Value Ref Range Status   Specimen Description BLOOD RIGHT WRIST  Final   Special Requests BOTTLES DRAWN AEROBIC AND ANAEROBIC  10AERO,10ANA  Final   Culture NO GROWTH 2 DAYS  Final   Report Status PENDING  Incomplete    RADIOLOGY:  Ct Angio Chest Pe W/cm &/or Wo Cm  Result Date: 10/31/2015 CLINICAL DATA:  60 year old female with known carcinoma, metastatic. History of lung carcinoma New complaints of back pain in chills generalized body aches and fevers. EXAM: CT CHEST, ABDOMEN, AND PELVIS WITH CONTRAST TECHNIQUE: Multidetector CT imaging of the chest, abdomen and pelvis was performed following the standard protocol during bolus administration of intravenous contrast. CONTRAST:  85 cc Isovue 370 COMPARISON:  CT 10/05/2015, 07/29/2015 FINDINGS: CT CHEST FINDINGS Port catheter on the left chest wall via subclavian vein. No axillary or supraclavicular adenopathy. Unremarkable appearance of the thoracic inlet, including the visualized thyroid. Several mediastinal lymph nodes present, similar to comparison CT. None of these are enlarged by CT size criteria. Small hiatal hernia. Calcifications of  the ascending and descending thoracic aorta. No dissection. No periaortic fluid. No aneurysm. Calcifications of the branch vessels. No central, lobar, segmental, or proximal subsegmental filling defects to indicate pulmonary emboli. Heart size unchanged, within normal limits. Pericardial fluid along the left lateral pericardium associated with the left-sided pleural disease extending into the mediastinum. Soft tissue extends from the left-sided pleural disease to the posterior aspect of the left lateral ventricle, with poor visualization of the pericardium. Calcifications of native coronary vessels including left main, left anterior descending, circumflex, right coronary arteries. Similar appearance of circumferential soft tissue/fluid of the left pleura, with complete involvement of the parietal pleura. Left upper lobe relatively well aerated, with increasing atelectasis/scarring of the lingula. Complete collapse of the left lower lobe again with the greatest diameter of mass measuring 4.3 cm x 4.9 cm, similar to the comparison CT. Moderate volume of pleural fluid at the left base. Loss of volume on the left contributes to slight right to left shift of the mediastinal structures. No right-sided confluent airspace disease. No pleural effusion. Nodule within the superior segment of the right lower lobe measures 8 -9 mm, unchanged from the comparison CT. Small nodule within the posterior aspect of the right upper lobe (image 35). Small nodule within the fissure on image 33. Nodules are new from the CT dated 07/29/2015. Musculoskeletal: Lucent lesions involving the left third rib, fifth rib, seventh rib, eighth rib, ninth rib, tenth rib. Lucent lesions involving the right fourth rib, fifth rib, sixth rib, ninth rib, tenth rib. Pathologic fracture at the posterior tenth rib.  Progressing lucent lesions of the manubrium, sternum, as well as the thoracic vertebral bodies of T2, T3, T4, T5, T7, T8, T9, T10, T12. CT ABDOMEN  PELVIS FINDINGS Abdomen/pelvis: Unremarkable appearance of liver and spleen. Unremarkable appearance of bilateral adrenal glands. No peripancreatic or pericholecystic fluid or inflammatory changes. No radio-opaque gallstones. No intrahepatic or extrahepatic biliary ductal dilatation. No intra-peritoneal free air or significant free-fluid. 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 Appendix is not visualized, however, no inflammatory changes are present adjacent to the cecum to indicate an appendicitis. Circumferential thickening of the soft tissue involving the antrum of the stomach in the pylorus. No inflammatory changes of the mesenteric fat. No transition point or dilated bowel. Right Kidney/Ureter: No hydronephrosis. No nephrolithiasis. No perinephric stranding. Unremarkable course of the right ureter. Left Kidney/Ureter: No hydronephrosis. No nephrolithiasis. No perinephric stranding. Unremarkable course of the left ureter. Cystic lesion at the inferior left kidney again noted. Unremarkable appearance of the urinary bladder. Hysterectomy. Calcifications of the abdominal aorta and iliac vasculature. Musculoskeletal: Interval progression of disease of the lumbar spine with new metastatic lucent lesions involving L1, L2, L3, L4, and L5. New lesions of the bilateral sacrum, sacral base, and bilateral pelvic bones. New lesions involving the proximal femurs. No significant degenerative changes of the spine. IMPRESSION: Significant progression of skeletal metastatic disease, with new and enlarging lesions throughout the majority of the thoracic and lumbar spine, bilateral ribs, sternum, sacrum, bilateral pelvis, left humerus, left scapula, and bilateral femurs. Pathologic fracture of the posterior right tenth rib, as well as the left third rib and left seventh rib. Similar appearance of left thoracic metastatic disease with circumferential soft tissue/fluid of the parietal and visceral pleura,  complete volume loss of the left lower lobe, small pleural effusion, and invasion of the mediastinal structures. Small nodules of the right upper lobe and left lower lobe, concerning for progression of pulmonary Mets in the right chest. Circumferential soft tissue thickening in the distal stomach at the pylorus. This may represent gastritis, and correlation with symptoms and potentially upper endoscopy may be considered. Signed, Dulcy Fanny. Earleen Newport, DO [facility] Rad[facility] Electronically Signed   By: Corrie Mckusick D.O.   On: 10/31/2015 12:34   Mr Jeri Cos IZ Contrast  Result Date: 11/01/2015 CLINICAL DATA:  Metastatic lung cancer. EXAM: MRI HEAD WITHOUT AND WITH CONTRAST TECHNIQUE: Multiplanar, multiecho pulse sequences of the brain and surrounding structures were obtained without and with intravenous contrast. CONTRAST:  65m MULTIHANCE GADOBENATE DIMEGLUMINE 529 MG/ML IV SOLN COMPARISON:  08/14/2015 FINDINGS: Interval development of multiple enhancing bone lesions compatible with metastatic disease. There is a 15 mm lesion in the right clivus which was not present previously. This may be eroding the floor of the sella. No significant invasion of the cavernous sinus. 15 mm enhancing lesion in the right parietal bone is new. Smaller enhancing lesions in the parietal bone bilaterally all compatible with metastatic disease. Postcontrast imaging demonstrates mild motion degrading image quality. Allowing for this, no metastatic disease is present in the brain. No enhancing mass lesion in the brain. Leptomeningeal enhancement normal. Ventricle size normal. Cerebral volume normal. Negative for acute infarct. Mild chronic microvascular ischemic change in the white matter similar to the prior study. Mild mucosal edema in the paranasal sinuses. No orbital mass lesion. IMPRESSION: Interval development of metastatic disease to the skull and skullbase compared with the MRI of  08/14/2015. Findings indicate rapid tumor growth. Negative for metastatic disease to the brain. Electronically Signed  By: Franchot Gallo M.D.   On: 11/01/2015 11:26   Ct Abdomen Pelvis W Contrast  Result Date: 10/31/2015 CLINICAL DATA:  60 year old female with known carcinoma, metastatic. History of lung carcinoma New complaints of back pain in chills generalized body aches and fevers. EXAM: CT CHEST, ABDOMEN, AND PELVIS WITH CONTRAST TECHNIQUE: Multidetector CT imaging of the chest, abdomen and pelvis was performed following the standard protocol during bolus administration of intravenous contrast. CONTRAST:  85 cc Isovue 370 COMPARISON:  CT 10/05/2015, 07/29/2015 FINDINGS: CT CHEST FINDINGS Port catheter on the left chest wall via subclavian vein. No axillary or supraclavicular adenopathy. Unremarkable appearance of the thoracic inlet, including the visualized thyroid. Several mediastinal lymph nodes present, similar to comparison CT. None of these are enlarged by CT size criteria. Small hiatal hernia. Calcifications of the ascending and descending thoracic aorta. No dissection. No periaortic fluid. No aneurysm. Calcifications of the branch vessels. No central, lobar, segmental, or proximal subsegmental filling defects to indicate pulmonary emboli. Heart size unchanged, within normal limits. Pericardial fluid along the left lateral pericardium associated with the left-sided pleural disease extending into the mediastinum. Soft tissue extends from the left-sided pleural disease to the posterior aspect of the left lateral ventricle, with poor visualization of the pericardium. Calcifications of native coronary vessels including left main, left anterior descending, circumflex, right coronary arteries. Similar appearance of circumferential soft tissue/fluid of the left pleura, with complete involvement of the parietal pleura. Left upper lobe relatively well aerated, with increasing atelectasis/scarring of the  lingula. Complete collapse of the left lower lobe again with the greatest diameter of mass measuring 4.3 cm x 4.9 cm, similar to the comparison CT. Moderate volume of pleural fluid at the left base. Loss of volume on the left contributes to slight right to left shift of the mediastinal structures. No right-sided confluent airspace disease. No pleural effusion. Nodule within the superior segment of the right lower lobe measures 8 -9 mm, unchanged from the comparison CT. Small nodule within the posterior aspect of the right upper lobe (image 35). Small nodule within the fissure on image 33. Nodules are new from the CT dated 07/29/2015. Musculoskeletal: Lucent lesions involving the left third rib, fifth rib, seventh rib, eighth rib, ninth rib, tenth rib. Lucent lesions involving the right fourth rib, fifth rib, sixth rib, ninth rib, tenth rib. Pathologic fracture at the posterior tenth rib. Progressing lucent lesions of the manubrium, sternum, as well as the thoracic vertebral bodies of T2, T3, T4, T5, T7, T8, T9, T10, T12. CT ABDOMEN PELVIS FINDINGS Abdomen/pelvis: Unremarkable appearance of liver and spleen. Unremarkable appearance of bilateral adrenal glands. No peripancreatic or pericholecystic fluid or inflammatory changes. No radio-opaque gallstones. No intrahepatic or extrahepatic biliary ductal dilatation. No intra-peritoneal free air or significant free-fluid. 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 Appendix is not visualized, however, no inflammatory changes are present adjacent to the cecum to indicate an appendicitis. Circumferential thickening of the soft tissue involving the antrum of the stomach in the pylorus. No inflammatory changes of the mesenteric fat. No transition point or dilated bowel. Right Kidney/Ureter: No hydronephrosis. No nephrolithiasis. No perinephric stranding. Unremarkable course of the right ureter. Left Kidney/Ureter: No hydronephrosis. No nephrolithiasis. No  perinephric stranding. Unremarkable course of the left ureter. Cystic lesion at the inferior left kidney again noted. Unremarkable appearance of the urinary bladder. Hysterectomy. Calcifications of the abdominal aorta and iliac vasculature. Musculoskeletal: Interval progression of disease of the lumbar spine with new metastatic lucent lesions involving L1, L2, L3, L4,  and L5. New lesions of the bilateral sacrum, sacral base, and bilateral pelvic bones. New lesions involving the proximal femurs. No significant degenerative changes of the spine. IMPRESSION: Significant progression of skeletal metastatic disease, with new and enlarging lesions throughout the majority of the thoracic and lumbar spine, bilateral ribs, sternum, sacrum, bilateral pelvis, left humerus, left scapula, and bilateral femurs. Pathologic fracture of the posterior right tenth rib, as well as the left third rib and left seventh rib. Similar appearance of left thoracic metastatic disease with circumferential soft tissue/fluid of the parietal and visceral pleura, complete volume loss of the left lower lobe, small pleural effusion, and invasion of the mediastinal structures. Small nodules of the right upper lobe and left lower lobe, concerning for progression of pulmonary Mets in the right chest. Circumferential soft tissue thickening in the distal stomach at the pylorus. This may represent gastritis, and correlation with symptoms and potentially upper endoscopy may be considered. Signed, Dulcy Fanny. Earleen Newport, DO [facility] Electronically Signed   By: Corrie Mckusick D.O.   On: 10/31/2015 12:34     Management plans discussed with the patient, family and they are in agreement.  CODE STATUS:     Code Status Orders        Start     Ordered   10/31/15 1752  Full code  Continuous     10/31/15 1751    Code Status History    Date Active Date Inactive Code Status Order ID Comments User  Context   10/24/2015 10:09 AM 10/25/2015  4:43 PM Full Code [id number]  Harrie Foreman, MD ED   08/10/2015  1:44 AM 08/12/2015  4:45 PM Full Code [id number]  Lance Coon, MD Inpatient   07/29/2015  5:56 PM 07/30/2015  4:42 PM Full Code [id number]  Gladstone Lighter, MD Inpatient   07/11/2015  5:25 PM 07/19/2015  6:21 PM Full Code [id number]  Henreitta Leber, MD Inpatient   06/25/2015  6:35 PM 06/27/2015  4:00 PM Full Code [id number]  Theodoro Grist, MD ED      TOTAL TIME TAKING CARE OF THIS PATIENT: 40 minutes.    Alter Moss M.D on 11/02/2015 at 8:28 AM  Between 7am to 6pm - Pager - [phone] After 6pm go to [email or web address] - password [email or web address] [facility]  [facility]  Office  [phone]  CC: Primary care physician; Kasandra Knudsen, NP

## 2015-11-02 NOTE — Progress Notes (Signed)
sse  This pm. 1000 ml sol given 500 ml at the time due to pts tol.  Sol returned odf  Tan color sol with  Flecks of stool both times.   slet this pm strongly after pain med and awoke  To less pain. Cont to feel weak. For discharge this pm.

## 2015-11-02 NOTE — Progress Notes (Signed)
New referral for Home Palliative Medicine Consult received from [place]. Patient information faxed to [facility]. Thank you. Flo Shanks RN, BSN, [facility] [place], [facility] [phone]

## 2015-11-02 NOTE — Care Management (Addendum)
Patient informed CM that she was currently being followed by home health.  A referral had been made to [facility].  CM contacted agency and informed that patient was closed to service 7/20.  Patient's daughter relays that agency was suppose to resume services but "they never did."  Spoke with attending regarding palliative care for sx management and home health.  Patient and daughter at present time confirmed that patient wishes to have everything done and remains a full code.  [facility] may offer a smooth transition to hospice in the event patient chooses hospice but patient could not be seen for at least one week.  Referral to [facility].  Patient in agreement and can see patient 8/11.  Aniceto Boss is agreeable to in home palliative care service.  Provided referral to [facility] and provided brochure.  Daughter to transport home

## 2015-11-03 ENCOUNTER — Other Ambulatory Visit: Payer: Self-pay | Admitting: *Deleted

## 2015-11-03 DIAGNOSIS — C7951 Secondary malignant neoplasm of bone: Secondary | ICD-10-CM

## 2015-11-05 LAB — CULTURE, BLOOD (ROUTINE X 2): CULTURE: NO GROWTH

## 2015-11-06 DIAGNOSIS — C7951 Secondary malignant neoplasm of bone: Secondary | ICD-10-CM | POA: Diagnosis not present

## 2015-11-06 DIAGNOSIS — Z510 Encounter for antineoplastic radiation therapy: Secondary | ICD-10-CM | POA: Diagnosis present

## 2015-11-06 DIAGNOSIS — C3492 Malignant neoplasm of unspecified part of left bronchus or lung: Secondary | ICD-10-CM | POA: Diagnosis not present

## 2015-11-07 ENCOUNTER — Ambulatory Visit
Admission: RE | Admit: 2015-11-07 | Discharge: 2015-11-07 | Disposition: A | Discharge status: Home / Self Care | Payer: Medicare Other | Source: Ambulatory Visit | Attending: Radiation Oncology | Admitting: Radiation Oncology

## 2015-11-07 DIAGNOSIS — Z510 Encounter for antineoplastic radiation therapy: Secondary | ICD-10-CM | POA: Diagnosis not present

## 2015-11-08 ENCOUNTER — Ambulatory Visit
Admission: RE | Admit: 2015-11-08 | Discharge: 2015-11-08 | Disposition: A | Discharge status: Home / Self Care | Payer: Medicare Other | Source: Ambulatory Visit | Attending: Radiation Oncology | Admitting: Radiation Oncology

## 2015-11-08 DIAGNOSIS — Z510 Encounter for antineoplastic radiation therapy: Secondary | ICD-10-CM | POA: Diagnosis not present

## 2015-11-09 ENCOUNTER — Ambulatory Visit
Admission: RE | Admit: 2015-11-09 | Discharge: 2015-11-09 | Disposition: A | Discharge status: Home / Self Care | Payer: Medicare Other | Source: Ambulatory Visit | Attending: Radiation Oncology | Admitting: Radiation Oncology

## 2015-11-09 DIAGNOSIS — Z510 Encounter for antineoplastic radiation therapy: Secondary | ICD-10-CM | POA: Diagnosis not present

## 2015-11-10 ENCOUNTER — Ambulatory Visit
Admission: RE | Admit: 2015-11-10 | Discharge: 2015-11-10 | Disposition: A | Discharge status: Home / Self Care | Payer: Medicare Other | Source: Ambulatory Visit | Attending: Radiation Oncology | Admitting: Radiation Oncology

## 2015-11-10 DIAGNOSIS — Z510 Encounter for antineoplastic radiation therapy: Secondary | ICD-10-CM | POA: Diagnosis not present

## 2015-11-13 ENCOUNTER — Ambulatory Visit
Admission: RE | Admit: 2015-11-13 | Discharge: 2015-11-13 | Disposition: A | Discharge status: Home / Self Care | Payer: Medicare Other | Source: Ambulatory Visit | Attending: Radiation Oncology | Admitting: Radiation Oncology

## 2015-11-14 ENCOUNTER — Emergency Department: Payer: Medicare Other

## 2015-11-14 ENCOUNTER — Inpatient Hospital Stay
Admission: EM | Admit: 2015-11-14 | Discharge: 2015-11-16 | DRG: 542 | Disposition: A | Discharge status: Hospice | Payer: Medicare Other | Attending: Internal Medicine | Admitting: Internal Medicine

## 2015-11-14 ENCOUNTER — Inpatient Hospital Stay: Payer: Medicare Other

## 2015-11-14 ENCOUNTER — Encounter: Payer: Self-pay | Admitting: Internal Medicine

## 2015-11-14 ENCOUNTER — Ambulatory Visit: Payer: Medicare Other

## 2015-11-14 ENCOUNTER — Inpatient Hospital Stay
Admit: 2015-11-14 | Discharge: 2015-11-14 | Disposition: A | Discharge status: Home / Self Care | Payer: Medicare Other | Attending: Internal Medicine | Admitting: Internal Medicine

## 2015-11-14 DIAGNOSIS — E876 Hypokalemia: Secondary | ICD-10-CM | POA: Diagnosis present

## 2015-11-14 DIAGNOSIS — E871 Hypo-osmolality and hyponatremia: Secondary | ICD-10-CM | POA: Diagnosis present

## 2015-11-14 DIAGNOSIS — I214 Non-ST elevation (NSTEMI) myocardial infarction: Secondary | ICD-10-CM | POA: Diagnosis present

## 2015-11-14 DIAGNOSIS — I959 Hypotension, unspecified: Secondary | ICD-10-CM | POA: Diagnosis present

## 2015-11-14 DIAGNOSIS — E119 Type 2 diabetes mellitus without complications: Secondary | ICD-10-CM | POA: Diagnosis present

## 2015-11-14 DIAGNOSIS — F329 Major depressive disorder, single episode, unspecified: Secondary | ICD-10-CM | POA: Diagnosis present

## 2015-11-14 DIAGNOSIS — Z880 Allergy status to penicillin: Secondary | ICD-10-CM

## 2015-11-14 DIAGNOSIS — Z7951 Long term (current) use of inhaled steroids: Secondary | ICD-10-CM

## 2015-11-14 DIAGNOSIS — Z7982 Long term (current) use of aspirin: Secondary | ICD-10-CM

## 2015-11-14 DIAGNOSIS — Z7983 Long term (current) use of bisphosphonates: Secondary | ICD-10-CM

## 2015-11-14 DIAGNOSIS — I4891 Unspecified atrial fibrillation: Secondary | ICD-10-CM | POA: Diagnosis present

## 2015-11-14 DIAGNOSIS — I472 Ventricular tachycardia, unspecified: Secondary | ICD-10-CM

## 2015-11-14 DIAGNOSIS — Z66 Do not resuscitate: Secondary | ICD-10-CM | POA: Diagnosis present

## 2015-11-14 DIAGNOSIS — E7800 Pure hypercholesterolemia, unspecified: Secondary | ICD-10-CM | POA: Diagnosis present

## 2015-11-14 DIAGNOSIS — K2970 Gastritis, unspecified, without bleeding: Secondary | ICD-10-CM | POA: Diagnosis present

## 2015-11-14 DIAGNOSIS — I10 Essential (primary) hypertension: Secondary | ICD-10-CM | POA: Diagnosis present

## 2015-11-14 DIAGNOSIS — Z515 Encounter for palliative care: Secondary | ICD-10-CM | POA: Diagnosis present

## 2015-11-14 DIAGNOSIS — C3492 Malignant neoplasm of unspecified part of left bronchus or lung: Secondary | ICD-10-CM | POA: Diagnosis present

## 2015-11-14 DIAGNOSIS — G4733 Obstructive sleep apnea (adult) (pediatric): Secondary | ICD-10-CM | POA: Diagnosis present

## 2015-11-14 DIAGNOSIS — Z7901 Long term (current) use of anticoagulants: Secondary | ICD-10-CM

## 2015-11-14 DIAGNOSIS — C3490 Malignant neoplasm of unspecified part of unspecified bronchus or lung: Secondary | ICD-10-CM

## 2015-11-14 DIAGNOSIS — G893 Neoplasm related pain (acute) (chronic): Secondary | ICD-10-CM | POA: Diagnosis present

## 2015-11-14 DIAGNOSIS — C7951 Secondary malignant neoplasm of bone: Secondary | ICD-10-CM | POA: Diagnosis present

## 2015-11-14 DIAGNOSIS — R079 Chest pain, unspecified: Secondary | ICD-10-CM | POA: Diagnosis not present

## 2015-11-14 DIAGNOSIS — J159 Unspecified bacterial pneumonia: Secondary | ICD-10-CM | POA: Diagnosis present

## 2015-11-14 DIAGNOSIS — Z789 Other specified health status: Secondary | ICD-10-CM | POA: Diagnosis not present

## 2015-11-14 DIAGNOSIS — R0602 Shortness of breath: Secondary | ICD-10-CM | POA: Diagnosis not present

## 2015-11-14 DIAGNOSIS — S2232XA Fracture of one rib, left side, initial encounter for closed fracture: Secondary | ICD-10-CM

## 2015-11-14 DIAGNOSIS — R1012 Left upper quadrant pain: Secondary | ICD-10-CM

## 2015-11-14 DIAGNOSIS — R778 Other specified abnormalities of plasma proteins: Secondary | ICD-10-CM

## 2015-11-14 DIAGNOSIS — Z7984 Long term (current) use of oral hypoglycemic drugs: Secondary | ICD-10-CM

## 2015-11-14 DIAGNOSIS — Z9981 Dependence on supplemental oxygen: Secondary | ICD-10-CM | POA: Diagnosis not present

## 2015-11-14 DIAGNOSIS — J189 Pneumonia, unspecified organism: Secondary | ICD-10-CM | POA: Diagnosis present

## 2015-11-14 DIAGNOSIS — R7989 Other specified abnormal findings of blood chemistry: Secondary | ICD-10-CM

## 2015-11-14 DIAGNOSIS — J440 Chronic obstructive pulmonary disease with acute lower respiratory infection: Secondary | ICD-10-CM | POA: Diagnosis present

## 2015-11-14 DIAGNOSIS — M546 Pain in thoracic spine: Secondary | ICD-10-CM

## 2015-11-14 DIAGNOSIS — M8458XA Pathological fracture in neoplastic disease, other specified site, initial encounter for fracture: Secondary | ICD-10-CM | POA: Diagnosis present

## 2015-11-14 DIAGNOSIS — R0902 Hypoxemia: Secondary | ICD-10-CM | POA: Diagnosis present

## 2015-11-14 DIAGNOSIS — Z8249 Family history of ischemic heart disease and other diseases of the circulatory system: Secondary | ICD-10-CM

## 2015-11-14 DIAGNOSIS — Z79899 Other long term (current) drug therapy: Secondary | ICD-10-CM

## 2015-11-14 DIAGNOSIS — Z803 Family history of malignant neoplasm of breast: Secondary | ICD-10-CM

## 2015-11-14 DIAGNOSIS — Z87891 Personal history of nicotine dependence: Secondary | ICD-10-CM | POA: Diagnosis not present

## 2015-11-14 DIAGNOSIS — I248 Other forms of acute ischemic heart disease: Secondary | ICD-10-CM | POA: Diagnosis present

## 2015-11-14 LAB — CBC
HCT: 32.2 % — ABNORMAL LOW (ref 35.0–47.0)
Hemoglobin: 10.7 g/dL — ABNORMAL LOW (ref 12.0–16.0)
MCH: 26.9 pg (ref 26.0–34.0)
MCHC: 33.2 g/dL (ref 32.0–36.0)
MCV: 80.9 fL (ref 80.0–100.0)
PLATELETS: 242 10*3/uL (ref 150–440)
RBC: 3.98 MIL/uL (ref 3.80–5.20)
RDW: 22.7 % — AB (ref 11.5–14.5)
WBC: 20.1 10*3/uL — AB (ref 3.6–11.0)

## 2015-11-14 LAB — BASIC METABOLIC PANEL
ANION GAP: 15 (ref 5–15)
BUN: 18 mg/dL (ref 6–20)
CALCIUM: 10.6 mg/dL — AB (ref 8.9–10.3)
CO2: 25 mmol/L (ref 22–32)
CREATININE: 0.65 mg/dL (ref 0.44–1.00)
Chloride: 94 mmol/L — ABNORMAL LOW (ref 101–111)
GLUCOSE: 118 mg/dL — AB (ref 65–99)
Potassium: 3.3 mmol/L — ABNORMAL LOW (ref 3.5–5.1)
Sodium: 134 mmol/L — ABNORMAL LOW (ref 135–145)

## 2015-11-14 LAB — URINALYSIS COMPLETE WITH MICROSCOPIC (ARMC ONLY)
BACTERIA UA: NONE SEEN
BILIRUBIN URINE: NEGATIVE
GLUCOSE, UA: NEGATIVE mg/dL
Leukocytes, UA: NEGATIVE
NITRITE: NEGATIVE
Protein, ur: 30 mg/dL — AB
SQUAMOUS EPITHELIAL / LPF: NONE SEEN
Specific Gravity, Urine: 1.02 (ref 1.005–1.030)
pH: 5 (ref 5.0–8.0)

## 2015-11-14 LAB — HEPARIN LEVEL (UNFRACTIONATED)

## 2015-11-14 LAB — ECHOCARDIOGRAM COMPLETE
Height: 63 in
Weight: 2480 oz

## 2015-11-14 LAB — GLUCOSE, CAPILLARY
GLUCOSE-CAPILLARY: 138 mg/dL — AB (ref 65–99)
GLUCOSE-CAPILLARY: 100 mg/dL — AB (ref 65–99)
GLUCOSE-CAPILLARY: 106 mg/dL — AB (ref 65–99)
Glucose-Capillary: 122 mg/dL — ABNORMAL HIGH (ref 65–99)

## 2015-11-14 LAB — TROPONIN I
TROPONIN I: 0.07 ng/mL — AB (ref <0.03)
Troponin I: 0.11 ng/mL (ref <0.03)
Troponin I: 0.09 ng/mL (ref <0.03)
Troponin I: 0.09 ng/mL (ref <0.03)

## 2015-11-14 LAB — APTT: APTT: 45 s — AB (ref 24–36)

## 2015-11-14 LAB — MAGNESIUM: MAGNESIUM: 2.1 mg/dL (ref 1.7–2.4)

## 2015-11-14 LAB — MRSA PCR SCREENING: MRSA BY PCR: NEGATIVE

## 2015-11-14 MED ORDER — FOLIC ACID 1 MG PO TABS
1.0000 mg | ORAL_TABLET | Freq: Every day | ORAL | Status: DC
  Administered 2015-11-14 – 2015-11-16 (×3): 1 mg via ORAL
  Filled 2015-11-14 (×3): qty 1

## 2015-11-14 MED ORDER — MAGNESIUM SULFATE 2 GM/50ML IV SOLN
2.0000 g | Freq: Once | INTRAVENOUS | Status: AC
  Administered 2015-11-14: 2 g via INTRAVENOUS
  Filled 2015-11-14: qty 50

## 2015-11-14 MED ORDER — POTASSIUM CHLORIDE 20 MEQ/15ML (10%) PO SOLN
20.0000 meq | Freq: Once | ORAL | Status: AC
  Administered 2015-11-14: 20 meq via ORAL
  Filled 2015-11-14: qty 15

## 2015-11-14 MED ORDER — SIMVASTATIN 20 MG PO TABS
20.0000 mg | ORAL_TABLET | Freq: Every day | ORAL | Status: DC
  Administered 2015-11-14 – 2015-11-15 (×2): 20 mg via ORAL
  Filled 2015-11-14 (×2): qty 1

## 2015-11-14 MED ORDER — NITROGLYCERIN 0.4 MG SL SUBL: 0.4000 mg | SUBLINGUAL_TABLET | SUBLINGUAL | Status: DC | PRN

## 2015-11-14 MED ORDER — INSULIN ASPART 100 UNIT/ML ~~LOC~~ SOLN
0.0000 [IU] | Freq: Three times a day (TID) | SUBCUTANEOUS | Status: DC
  Administered 2015-11-14: 2 [IU] via SUBCUTANEOUS
  Filled 2015-11-14: qty 2

## 2015-11-14 MED ORDER — AMIODARONE HCL IN DEXTROSE 360-4.14 MG/200ML-% IV SOLN
60.0000 mg/h | Freq: Once | INTRAVENOUS | Status: AC
  Administered 2015-11-14: 60 mg/h via INTRAVENOUS
  Filled 2015-11-14: qty 200

## 2015-11-14 MED ORDER — AMIODARONE HCL IN DEXTROSE 360-4.14 MG/200ML-% IV SOLN
60.0000 mg/h | INTRAVENOUS | Status: AC
  Administered 2015-11-14: 60 mg/h via INTRAVENOUS
  Filled 2015-11-14: qty 200

## 2015-11-14 MED ORDER — AMIODARONE IV BOLUS ONLY 150 MG/100ML
INTRAVENOUS | Status: AC
  Administered 2015-11-14: 150 mg via INTRAVENOUS
  Filled 2015-11-14: qty 100

## 2015-11-14 MED ORDER — SODIUM CHLORIDE 0.9 % IV BOLUS (SEPSIS)
1000.0000 mL | Freq: Once | INTRAVENOUS | Status: AC
  Administered 2015-11-14: 1000 mL via INTRAVENOUS

## 2015-11-14 MED ORDER — AMIODARONE IV BOLUS ONLY 150 MG/100ML
150.0000 mg | Freq: Once | INTRAVENOUS | Status: AC
  Administered 2015-11-14: 150 mg via INTRAVENOUS

## 2015-11-14 MED ORDER — LEVOFLOXACIN IN D5W 750 MG/150ML IV SOLN: 750.0000 mg | INTRAVENOUS | Status: DC

## 2015-11-14 MED ORDER — ENOXAPARIN SODIUM 80 MG/0.8ML ~~LOC~~ SOLN
70.0000 mg | Freq: Two times a day (BID) | SUBCUTANEOUS | Status: DC
Start: 2015-11-14 — End: 2015-11-15
  Administered 2015-11-14 (×2): 70 mg via SUBCUTANEOUS
  Filled 2015-11-14 (×2): qty 0.8

## 2015-11-14 MED ORDER — METOPROLOL TARTRATE 25 MG PO TABS
25.0000 mg | ORAL_TABLET | Freq: Two times a day (BID) | ORAL | Status: DC
  Administered 2015-11-14 – 2015-11-16 (×5): 25 mg via ORAL
  Filled 2015-11-14 (×5): qty 1

## 2015-11-14 MED ORDER — ALBUTEROL SULFATE (2.5 MG/3ML) 0.083% IN NEBU: 2.5000 mg | INHALATION_SOLUTION | Freq: Four times a day (QID) | RESPIRATORY_TRACT | Status: DC | PRN

## 2015-11-14 MED ORDER — LEVOFLOXACIN IN D5W 750 MG/150ML IV SOLN
750.0000 mg | INTRAVENOUS | Status: DC
  Administered 2015-11-15 – 2015-11-16 (×2): 750 mg via INTRAVENOUS
  Filled 2015-11-14 (×4): qty 150

## 2015-11-14 MED ORDER — ASPIRIN 300 MG RE SUPP: 300.0000 mg | RECTAL | Status: AC

## 2015-11-14 MED ORDER — VANCOMYCIN HCL IN DEXTROSE 750-5 MG/150ML-% IV SOLN
750.0000 mg | Freq: Once | INTRAVENOUS | Status: AC
  Administered 2015-11-14: 750 mg via INTRAVENOUS
  Filled 2015-11-14 (×2): qty 150

## 2015-11-14 MED ORDER — POLYETHYLENE GLYCOL 3350 17 G PO PACK
17.0000 g | PACK | Freq: Every day | ORAL | Status: DC
  Administered 2015-11-14: 17 g via ORAL
  Filled 2015-11-14: qty 1

## 2015-11-14 MED ORDER — ALENDRONATE SODIUM 70 MG PO TABS: 70.0000 mg | ORAL_TABLET | ORAL | Status: DC

## 2015-11-14 MED ORDER — DULOXETINE HCL 60 MG PO CPEP
90.0000 mg | ORAL_CAPSULE | Freq: Every day | ORAL | Status: DC
  Administered 2015-11-14 – 2015-11-16 (×3): 90 mg via ORAL
  Filled 2015-11-14 (×2): qty 1
  Filled 2015-11-14: qty 3

## 2015-11-14 MED ORDER — ACETAMINOPHEN 325 MG PO TABS
650.0000 mg | ORAL_TABLET | ORAL | Status: DC | PRN
  Administered 2015-11-16: 650 mg via ORAL
  Filled 2015-11-14: qty 2

## 2015-11-14 MED ORDER — MIRTAZAPINE 15 MG PO TABS
45.0000 mg | ORAL_TABLET | Freq: Every day | ORAL | Status: DC
  Administered 2015-11-14 – 2015-11-15 (×2): 45 mg via ORAL
  Filled 2015-11-14: qty 3

## 2015-11-14 MED ORDER — PANTOPRAZOLE SODIUM 40 MG PO TBEC
40.0000 mg | DELAYED_RELEASE_TABLET | Freq: Every day | ORAL | Status: DC
  Administered 2015-11-14 – 2015-11-15 (×2): 40 mg via ORAL
  Filled 2015-11-14 (×2): qty 1

## 2015-11-14 MED ORDER — POTASSIUM CHLORIDE IN NACL 20-0.9 MEQ/L-% IV SOLN
INTRAVENOUS | Status: DC
  Administered 2015-11-14 – 2015-11-15 (×2): via INTRAVENOUS
  Filled 2015-11-14 (×4): qty 1000

## 2015-11-14 MED ORDER — DEXTROSE 5 % IV SOLN: 60.0000 mg/h | Freq: Once | INTRAVENOUS | Status: DC

## 2015-11-14 MED ORDER — ASPIRIN 81 MG PO CHEW
324.0000 mg | CHEWABLE_TABLET | ORAL | Status: AC
  Administered 2015-11-14: 324 mg via ORAL
  Filled 2015-11-14: qty 4

## 2015-11-14 MED ORDER — MAGNESIUM OXIDE 400 (241.3 MG) MG PO TABS
400.0000 mg | ORAL_TABLET | Freq: Every day | ORAL | Status: DC
Start: 2015-11-14 — End: 2015-11-16
  Administered 2015-11-14 – 2015-11-15 (×2): 400 mg via ORAL
  Filled 2015-11-14 (×2): qty 1

## 2015-11-14 MED ORDER — ASPIRIN EC 81 MG PO TBEC
81.0000 mg | DELAYED_RELEASE_TABLET | Freq: Every day | ORAL | Status: DC
  Administered 2015-11-15 – 2015-11-16 (×2): 81 mg via ORAL
  Filled 2015-11-14 (×2): qty 1

## 2015-11-14 MED ORDER — ASPIRIN EC 81 MG PO TBEC: 81.0000 mg | DELAYED_RELEASE_TABLET | Freq: Every day | ORAL | Status: DC

## 2015-11-14 MED ORDER — ONDANSETRON HCL 4 MG/2ML IJ SOLN
4.0000 mg | Freq: Four times a day (QID) | INTRAMUSCULAR | Status: DC | PRN
  Administered 2015-11-14: 4 mg via INTRAVENOUS
  Filled 2015-11-14 (×2): qty 2

## 2015-11-14 MED ORDER — HYDROCODONE-ACETAMINOPHEN 5-325 MG PO TABS
1.0000 | ORAL_TABLET | ORAL | Status: DC | PRN
  Administered 2015-11-14 – 2015-11-15 (×3): 1 via ORAL
  Administered 2015-11-15: 2 via ORAL
  Administered 2015-11-16: 1 via ORAL
  Administered 2015-11-16: 2 via ORAL
  Filled 2015-11-14 (×4): qty 1
  Filled 2015-11-14 (×2): qty 2

## 2015-11-14 MED ORDER — AMIODARONE HCL IN DEXTROSE 360-4.14 MG/200ML-% IV SOLN
30.0000 mg/h | INTRAVENOUS | Status: DC
  Administered 2015-11-14 – 2015-11-15 (×2): 30 mg/h via INTRAVENOUS
  Filled 2015-11-14 (×5): qty 200

## 2015-11-14 MED ORDER — ASPIRIN 81 MG PO CHEW
324.0000 mg | CHEWABLE_TABLET | Freq: Once | ORAL | Status: AC
  Administered 2015-11-14: 324 mg via ORAL

## 2015-11-14 MED ORDER — FENTANYL 50 MCG/HR TD PT72
75.0000 ug | MEDICATED_PATCH | TRANSDERMAL | Status: DC
  Administered 2015-11-14: 75 ug via TRANSDERMAL
  Filled 2015-11-14: qty 1

## 2015-11-14 MED ORDER — BUDESONIDE 0.5 MG/2ML IN SUSP
0.5000 mg | Freq: Two times a day (BID) | RESPIRATORY_TRACT | Status: DC
  Administered 2015-11-14 – 2015-11-16 (×5): 0.5 mg via RESPIRATORY_TRACT
  Filled 2015-11-14 (×5): qty 2

## 2015-11-14 MED ORDER — INSULIN ASPART 100 UNIT/ML ~~LOC~~ SOLN: 0.0000 [IU] | Freq: Every day | SUBCUTANEOUS | Status: DC

## 2015-11-14 MED ORDER — FLUTICASONE PROPIONATE HFA 220 MCG/ACT IN AERO: 1.0000 | INHALATION_SPRAY | Freq: Two times a day (BID) | RESPIRATORY_TRACT | Status: DC

## 2015-11-14 MED ORDER — SUCRALFATE 1 GM/10ML PO SUSP
1.0000 g | Freq: Three times a day (TID) | ORAL | Status: DC
  Administered 2015-11-14 – 2015-11-16 (×7): 1 g via ORAL
  Filled 2015-11-14 (×7): qty 10

## 2015-11-14 MED ORDER — GABAPENTIN 400 MG PO CAPS
800.0000 mg | ORAL_CAPSULE | Freq: Three times a day (TID) | ORAL | Status: DC
  Administered 2015-11-14 – 2015-11-16 (×6): 800 mg via ORAL
  Filled 2015-11-14 (×8): qty 2

## 2015-11-14 MED ORDER — SODIUM CHLORIDE 0.9 % IV SOLN
INTRAVENOUS | Status: DC
  Administered 2015-11-14: 75 mL/h via INTRAVENOUS

## 2015-11-14 MED ORDER — IOPAMIDOL (ISOVUE-370) INJECTION 76%
75.0000 mL | Freq: Once | INTRAVENOUS | Status: AC | PRN
  Administered 2015-11-14: 75 mL via INTRAVENOUS

## 2015-11-14 MED ORDER — LEVOFLOXACIN IN D5W 750 MG/150ML IV SOLN
750.0000 mg | Freq: Once | INTRAVENOUS | Status: AC
  Administered 2015-11-14: 750 mg via INTRAVENOUS

## 2015-11-14 MED ORDER — AMIODARONE LOAD VIA INFUSION
150.0000 mg | Freq: Once | INTRAVENOUS | Status: DC
  Filled 2015-11-14 (×2): qty 83.34

## 2015-11-14 MED ORDER — DOCUSATE SODIUM 100 MG PO CAPS
100.0000 mg | ORAL_CAPSULE | Freq: Two times a day (BID) | ORAL | Status: DC
  Administered 2015-11-14 – 2015-11-15 (×3): 100 mg via ORAL
  Filled 2015-11-14 (×3): qty 1

## 2015-11-14 MED ORDER — TRAZODONE HCL 50 MG PO TABS
150.0000 mg | ORAL_TABLET | Freq: Every day | ORAL | Status: DC
  Administered 2015-11-14 – 2015-11-15 (×2): 150 mg via ORAL
  Filled 2015-11-14 (×2): qty 1

## 2015-11-14 MED ORDER — VANCOMYCIN HCL IN DEXTROSE 750-5 MG/150ML-% IV SOLN
750.0000 mg | Freq: Two times a day (BID) | INTRAVENOUS | Status: DC
  Administered 2015-11-14 – 2015-11-15 (×2): 750 mg via INTRAVENOUS
  Filled 2015-11-14 (×4): qty 150

## 2015-11-14 MED ORDER — ENOXAPARIN SODIUM 80 MG/0.8ML ~~LOC~~ SOLN: 1.0000 mg/kg | Freq: Two times a day (BID) | SUBCUTANEOUS | Status: DC

## 2015-11-14 MED ORDER — CETYLPYRIDINIUM CHLORIDE 0.05 % MT LIQD
7.0000 mL | Freq: Two times a day (BID) | OROMUCOSAL | Status: DC
  Administered 2015-11-14 – 2015-11-16 (×3): 7 mL via OROMUCOSAL

## 2015-11-14 MED ORDER — HYDROMORPHONE HCL 1 MG/ML IJ SOLN
1.0000 mg | Freq: Once | INTRAMUSCULAR | Status: AC
  Administered 2015-11-14: 1 mg via INTRAVENOUS
  Filled 2015-11-14: qty 1

## 2015-11-14 MED ORDER — ALBUTEROL SULFATE HFA 108 (90 BASE) MCG/ACT IN AERS: 2.0000 | INHALATION_SPRAY | Freq: Four times a day (QID) | RESPIRATORY_TRACT | Status: DC | PRN

## 2015-11-14 NOTE — ED Provider Notes (Signed)
[facility] Emergency Department Provider Note   ____________________________________________   First MD Initiated Contact with Patient 11/14/15 [phone]     (approximate)  I have reviewed the triage vital signs and the nursing notes.   HISTORY  Chief Complaint Back Pain    HPI Charlene Williams is a 60 y.o. female comes into the hospital today with chest pain. The patient reports that she also has some back pain. The patient reports that her chest pain started today but the back and has been going on since last week. The patient has a history of lung cancer and was told last week that the cancer spread to all the bones down her back as well as in her ribs. The patient's family member reports that she's been doing strange things as well too. She is to restroom on the table and has been saying things that don't make any sense.They report that she has been talking out of her head. The patient has been taking Dilaudid and Vicodin but it does not seem to be helping. The patient reports that the pain is a 10 out of 10 in intensity in her back. The patient is here for evaluation. The patient has some mild shortness of breath as well.   Past Medical History:  Diagnosis Date  . Aneurysm ([place])   . Chest pain, pleuritic   . COPD (chronic obstructive pulmonary disease) ([place])    on home oxygen  . Depression   . Diabetes mellitus without complication ([place])   . Family history of chronic pain 01/17/2015  . Hiatal hernia   . Hypercholesteremia   . Hypertension   . Illicit drug use   . Lung cancer ([place])    adenocarcinoma left lung  . Migraines   . Sleep apnea   . Spine disorder     Patient Active Problem List   Diagnosis Date Noted  . Non-STEMI (non-ST elevated myocardial infarction) ([place]) 11/14/2015  . Community acquired pneumonia 11/14/2015  . Metastatic lung carcinoma ([place]) 10/31/2015  . Chest pain 10/24/2015  . Left upper extremity swelling 10/04/2015  .  Elevated serum creatinine 09/13/2015  . Sepsis ([place]) 08/09/2015  . HCAP (healthcare-associated pneumonia) 08/09/2015  . Pulmonary embolism ([place]) 07/29/2015  . Primary cancer of left lower lobe of lung ([place]) 07/19/2015  . Pleural effusion   . Recurrent left pleural effusion   . COPD (chronic obstructive pulmonary disease) ([place]) 07/11/2015  . SIRS (systemic inflammatory response syndrome) ([place]) 06/25/2015  . Pleural effusion, left 06/25/2015  . Left lower lobe pneumonia 06/25/2015  . Hypoxemia 06/25/2015  . Hyponatremia 06/25/2015  . Elevated troponin 06/25/2015  . Generalized weakness 06/25/2015  . Chronic lower extremity pain (Bilateral) 05/19/2015  . Substance use disorder (SUB Risk: Very high) 05/19/2015  . Myofascial pain 05/15/2015  . Muscle spasms of lower extremity 05/15/2015  . Chronic sacroiliac joint pain (Location of Primary Source of Pain) (Bilateral) (R>L) 05/15/2015  . Neurogenic pain 05/15/2015  . Avitaminosis D 05/15/2015  . Chronic pain syndrome 01/17/2015  . Chronic pain 01/17/2015  . Lumbar facet syndrome (Location of Primary Source of Pain) (Bilateral) (R>L) 01/17/2015  . Chronic low back pain (Location of Primary Source of Pain) (Bilateral) (R>L) 01/17/2015  . Chronic radicular lumbar pain (Bilateral) 01/17/2015  . Osteoarthritis of spine with radiculopathy, lumbar region 01/17/2015  . Polysubstance abuse 01/17/2015  . Lumbar spondylosis 01/17/2015  . Lumbar facet arthropathy (Bilateral) 01/17/2015  . Magnesium deficiency 01/17/2015  . Lumbar spinal stenosis (Central Spinal  Stenosis) (Severe) (L3-4 and L4-5) 01/17/2015  . Family history of chronic pain 01/17/2015  . Nicotine dependence 01/17/2015  . Smoker 01/17/2015  . Tobacco abuse 01/17/2015  . Essential hypertension, benign 01/17/2015  . Chronic obstructive pulmonary disease (COPD) ([place]) 01/17/2015  . Emphysema of lung ([place]) 01/17/2015  . History of bronchitis 01/17/2015  . History of shortness of  breath 01/17/2015  . Generalized anxiety disorder 01/17/2015  . Seizure disorder ([place]) 01/17/2015  . Depression 01/17/2015  . History of panic attacks 01/17/2015  . Non-insulin dependent type 2 diabetes mellitus ([place]) 01/17/2015    Past Surgical History:  Procedure Laterality Date  . ABDOMINAL HYSTERECTOMY    . EYE SURGERY    . HERNIA REPAIR    . PORTACATH PLACEMENT Left 07/14/2015   Procedure: INSERTION PORT-A-CATH;  Surgeon: Nestor Lewandowsky, MD;  Location: [facility] ORS;  Service: General;  Laterality: Left;  . REMOVAL OF PLEURAL DRAINAGE CATHETER N/A 07/18/2015   Procedure: REMOVAL OF PLEURAL DRAINAGE CATHETER;  Surgeon: Nestor Lewandowsky, MD;  Location: [facility] ORS;  Service: Thoracic;  Laterality: N/A;  . VIDEO ASSISTED THORACOSCOPY (VATS)/THOROCOTOMY Left 07/14/2015   Procedure: VIDEO ASSISTED THORACOSCOPY (VATS), pleural biopsy;  Surgeon: Nestor Lewandowsky, MD;  Location: [facility] ORS;  Service: General;  Laterality: Left;    Prior to Admission medications   Medication Sig Start Date End Date Taking? Authorizing Provider  albuterol (PROVENTIL HFA;VENTOLIN HFA) 108 (90 Base) MCG/ACT inhaler Inhale 2 puffs into the lungs every 6 (six) hours as needed for wheezing or shortness of breath. 08/12/15   Gladstone Lighter, MD  alendronate (FOSAMAX) 70 MG tablet Take 70 mg by mouth once a week. Pt takes on Tuesday.   Take with a full glass of water on an empty stomach.    Historical Provider, MD  apixaban (ELIQUIS) 5 MG TABS tablet Take 1 tablet (5 mg total) by mouth 2 (two) times daily. 07/30/15   Henreitta Leber, MD  aspirin EC 81 MG tablet Take 81 mg by mouth daily.    Historical Provider, MD  cetirizine (ZYRTEC) 10 MG tablet Take 10 mg by mouth at bedtime.    Historical Provider, MD  docusate sodium (COLACE) 100 MG capsule Take 1 capsule (100 mg total) by mouth 2 (two) times daily. 11/02/15   Fritzi Mandes, MD  DULoxetine (CYMBALTA) 30 MG capsule Take 90 mg by mouth daily.     Historical Provider, MD  fentaNYL  (DURAGESIC - DOSED MCG/HR) 75 MCG/HR Place 1 patch (75 mcg total) onto the skin every 3 (three) days. 11/02/15   Fritzi Mandes, MD  fluticasone (FLOVENT HFA) 220 MCG/ACT inhaler Inhale 1 puff into the lungs 2 (two) times daily.    Historical Provider, MD  folic acid (FOLVITE) 1 MG tablet Take 1 mg by mouth daily.    Historical Provider, MD  gabapentin (NEURONTIN) 800 MG tablet Take 800 mg by mouth 3 (three) times daily.    Historical Provider, MD  HYDROcodone-acetaminophen (NORCO/VICODIN) 5-325 MG tablet Take 1-2 tablets by mouth every 4 (four) hours as needed for moderate pain. 10/25/15   Henreitta Leber, MD  HYDROmorphone (DILAUDID) 2 MG tablet Take 1 tablet (2 mg total) by mouth every 4 (four) hours as needed for severe pain. 11/02/15   Fritzi Mandes, MD  lidocaine-prilocaine (EMLA) cream Apply 1 application topically as needed (prior to accessing port).    Historical Provider, MD  magnesium oxide (MAG-OX) 400 (241.3 Mg) MG tablet Take 400 mg by mouth at bedtime.    Historical Provider,  MD  metFORMIN (GLUCOPHAGE-XR) 750 MG 24 hr tablet Take 750 mg by mouth daily with breakfast.    Historical Provider, MD  metoprolol tartrate (LOPRESSOR) 25 MG tablet Take 1 tablet (25 mg total) by mouth 2 (two) times daily. 06/27/15   Bettey Costa, MD  mirtazapine (REMERON) 45 MG tablet Take 45 mg by mouth at bedtime.    Historical Provider, MD  omeprazole (PRILOSEC) 20 MG capsule Take 20 mg by mouth daily before breakfast.     Historical Provider, MD  ondansetron (ZOFRAN) 8 MG tablet Take 8 mg by mouth every 8 (eight) hours as needed for nausea or vomiting.    Historical Provider, MD  polyethylene glycol (MIRALAX / GLYCOLAX) packet Take 17 g by mouth daily. 11/02/15   Fritzi Mandes, MD  prochlorperazine (COMPAZINE) 10 MG tablet TAKE 1 TABLET ('10MG'$ ) BY MOUTH EVERY SIX HOURS AS NEEDED FOR NAUSEA OR VOMITING 09/01/15   Cammie Sickle, MD  promethazine (PHENERGAN) 25 MG suppository Place 1 suppository (25 mg total) rectally every 6  (six) hours as needed for nausea or vomiting. 07/30/15   Henreitta Leber, MD  simvastatin (ZOCOR) 20 MG tablet Take 20 mg by mouth at bedtime.     Historical Provider, MD  traZODone (DESYREL) 150 MG tablet Take 150 mg by mouth at bedtime.    Historical Provider, MD    Allergies Ampicillin and Penicillins  Family History  Problem Relation Age of Onset  . Heart disease Mother   . Breast cancer Sister     Social History Social History  Substance Use Topics  . Smoking status: Former Smoker    Packs/day: 1.50    Years: 45.00    Types: Cigarettes    Quit date: 04/29/2015  . Smokeless tobacco: Never Used     Comment: Quit 2 months ago  . Alcohol use No    Review of Systems Constitutional: No fever/chills Eyes: No visual changes. ENT: No sore throat. Cardiovascular:  chest pain. Respiratory: Mild shortness of breath. Gastrointestinal: No abdominal pain.  No nausea, no vomiting.  No diarrhea.  No constipation. Genitourinary: Negative for dysuria. Musculoskeletal:  back pain. Skin: Negative for rash. Neurological: Negative for headaches, focal weakness or numbness.  10-point ROS otherwise negative.  ____________________________________________   PHYSICAL EXAM:  VITAL SIGNS: ED Triage Vitals  Enc Vitals Group     BP 11/14/15 0224 116/80     Pulse Rate 11/14/15 0224 (!) 116     Resp 11/14/15 0224 (!) 22     Temp 11/14/15 0224 98.3 F (36.8 C)     Temp Source 11/14/15 0224 Oral     SpO2 11/14/15 0224 95 %     Weight 11/14/15 0226 155 lb (70.3 kg)     Height 11/14/15 0226 '5\' 3"'$  (1.6 m)     Head Circumference --      Peak Flow --      Pain Score 11/14/15 0224 10     Pain Loc --      Pain Edu? --      Excl. in [place]? --     Constitutional: Alert and oriented. Well appearing and in Severe distress. Eyes: Conjunctivae are normal. PERRL. EOMI. Head: Atraumatic. Nose: No congestion/rhinnorhea. Mouth/Throat: Mucous membranes are moist.  Oropharynx  non-erythematous. Cardiovascular: Normal rate, regular rhythm. Grossly normal heart sounds.  Good peripheral circulation. Respiratory: Normal respiratory effort.  No retractions. Lungs CTAB. Gastrointestinal: Soft and nontender. No distention. Positive bowel sounds Musculoskeletal: No lower extremity tenderness nor edema.  Neurologic:  Normal speech and language.  Skin:  Skin is warm, dry and intact.  Psychiatric: Mood and affect are normal.   ____________________________________________   LABS (all labs ordered are listed, but only abnormal results are displayed)  Labs Reviewed  CBC - Abnormal; Notable for the following:       Result Value   WBC 20.1 (*)    Hemoglobin 10.7 (*)    HCT 32.2 (*)    RDW 22.7 (*)    All other components within normal limits  BASIC METABOLIC PANEL - Abnormal; Notable for the following:    Sodium 134 (*)    Potassium 3.3 (*)    Chloride 94 (*)    Glucose, Bld 118 (*)    Calcium 10.6 (*)    All other components within normal limits  TROPONIN I - Abnormal; Notable for the following:    Troponin I 0.11 (*)    All other components within normal limits  URINALYSIS COMPLETEWITH MICROSCOPIC ([facility] ONLY) - Abnormal; Notable for the following:    Color, Urine YELLOW (*)    APPearance CLEAR (*)    Ketones, ur 1+ (*)    Hgb urine dipstick 1+ (*)    Protein, ur 30 (*)    All other components within normal limits   ____________________________________________  EKG  ED ECG REPORT I, Loney Hering, the attending physician, personally viewed and interpreted this ECG.   Date: 11/14/2015  EKG Time: 330  Rate: 76  Rhythm: normal sinus rhythm  Axis: normal  Intervals:none  ST&T Change: none  ____________________________________________  RADIOLOGY  Chest x-ray Thoracic spine x-ray ____________________________________________   PROCEDURES  Procedure(s) performed: None  Procedures  Critical Care performed: Yes, see critical care  note(s)  CRITICAL CARE Performed by: Charlesetta Ivory P   Total critical care time: 35 minutes  Critical care time was exclusive of separately billable procedures and treating other patients.  Critical care was necessary to treat or prevent imminent or life-threatening deterioration.  Critical care was time spent personally by me on the following activities: development of treatment plan with patient and/or surrogate as well as nursing, discussions with consultants, evaluation of patient's response to treatment, examination of patient, obtaining history from patient or surrogate, ordering and performing treatments and interventions, ordering and review of laboratory studies, ordering and review of radiographic studies, pulse oximetry and re-evaluation of patient's condition.   ____________________________________________   INITIAL IMPRESSION / ASSESSMENT AND PLAN / ED COURSE  Pertinent labs & imaging results that were available during my care of the patient were reviewed by me and considered in my medical decision making (see chart for details).  This is a 60 year old female who comes into the hospital today with some chest pain and back pain. 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 I will give the patient a dose of Dilaudid and check some blood work including a urine as her family is concerned she is been acting strange. I will reassess the patient.  Clinical Course  Value Comment By Time  DG Thoracic Spine 2 View No acute fracture or dislocation is identified. Multiple osseous metastasis are better characterized on the prior CT of the chest   Loney Hering, MD 08/22 0548  DG Chest 2 View 1. Stable elevated left hemidiaphragm, left mid and lower lung zone consolidations, and left-sided pleural thickening. 2. Mildly displaced right lateral T4 and T6 rib fractures are new. Additional pathologic fractures identified on prior  CT may are not clearly  appreciated.   Loney Hering, MD 08/22 [phone]  CT Head Wo Contrast 1. No acute intracranial abnormality. Stable small chronic right parietal infarct. 2. 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 The patient's heart rate would jump into the 170s and then go back down to normal. It would continuously do this. Initially give the patient a dose of magnesium sulfate 2 g IV 1. She continued to have these arrhythmias so I then gave her a dose of amiodarone 150 mg. After the initial amiodarone the patient's heart rate seemed more stabilized but it started to have some arrhythmias again. The patient had either sinus tachycardia, ventricular tachycardia or atrial fibrillation. The patient's urine is unremarkable. She did receive a second liter of normal saline. Given the results of the patient's x-ray showing increased fractures as well as her elevated troponin and arrhythmias I feel that the patient needs to be admitted to the hospitalist service. She was placed on an amiodarone drip as well. The patient also received a dose of aspirin. ____________________________________________   FINAL CLINICAL IMPRESSION(S) / ED DIAGNOSES  Final diagnoses:  Thoracic back pain, unspecified back pain laterality  Rib fracture, left, closed, initial encounter  Ventricular tachycardia (HCC)  Chest pain, unspecified chest pain type  Elevated troponin      NEW MEDICATIONS STARTED DURING THIS VISIT:  New Prescriptions   No medications on file     Note:  This document was prepared using Dragon voice recognition software and may include unintentional dictation errors.    Loney Hering, MD 11/14/15 [phone]

## 2015-11-14 NOTE — ED Notes (Signed)
pt c/o left sided chest pain starting today, pt reports having cancer in spine, ribs, and skull. States took dilaudid at home without relief. Pt had fentanyl patch on back, pt reports pain has not relieved. Daughter reports altered mental status for the past couple of days. During assessment, questions must be repeated so patient will answer, pt is slow to respond.

## 2015-11-14 NOTE — Progress Notes (Signed)
ANTICOAGULATION CONSULT NOTE - Initial Consult  Pharmacy Consult for Lovenox Indication: chest pain/ACS  Allergies  Allergen Reactions  . Ampicillin Hives and Other (See Comments)    Has patient had a PCN reaction causing immediate rash, facial/tongue/throat swelling, SOB or lightheadedness with hypotension: No Has patient had a PCN reaction causing severe rash involving mucus membranes or skin necrosis: No Has patient had a PCN reaction that required hospitalization No Has patient had a PCN reaction occurring within the last 10 years: Yes If all of the above answers are "NO", then may proceed with Cephalosporin use.  Marland Kitchen Penicillins Hives and Other (See Comments)    Has patient had a PCN reaction causing immediate rash, facial/tongue/throat swelling, SOB or lightheadedness with hypotension: No Has patient had a PCN reaction causing severe rash involving mucus membranes or skin necrosis: No Has patient had a PCN reaction that required hospitalization No Has patient had a PCN reaction occurring within the last 10 years: Yes If all of the above answers are "NO", then may proceed with Cephalosporin use.    Patient Measurements: Height: '5\' 2"'$  (157.5 cm) Weight: 145 lb 15.1 oz (66.2 kg) IBW/kg (Calculated) : 50.1  Vital Signs: Temp: 97.6 F (36.4 C) (08/22 1030) Temp Source: Oral (08/22 1030) BP: 130/77 (08/22 0900) Pulse Rate: 31 (08/22 1300)  Labs:  Recent Labs  11/14/15 0337 11/14/15 1002  HGB 10.7*  --   HCT 32.2*  --   PLT 242  --   APTT  --  45*  HEPARINUNFRC  --  <0.10*  CREATININE 0.65  --   TROPONINI 0.11* 0.09*    Estimated Creatinine Clearance: 65.9 mL/min (by C-G formula based on SCr of 0.8 mg/dL).   Medical History: Past Medical History:  Diagnosis Date  . Aneurysm ([place])   . Chest pain, pleuritic   . COPD (chronic obstructive pulmonary disease) ([place])    on home oxygen  . Depression   . Diabetes mellitus without complication ([place])   . Family history of  chronic pain 01/17/2015  . Hiatal hernia   . Hypercholesteremia   . Hypertension   . Illicit drug use   . Lung cancer ([place])    adenocarcinoma left lung  . Migraines   . Sleep apnea   . Spine disorder     Medications:  Prescriptions Prior to Admission  Medication Sig Dispense Refill Last Dose  . albuterol (PROVENTIL HFA;VENTOLIN HFA) 108 (90 Base) MCG/ACT inhaler Inhale 2 puffs into the lungs every 6 (six) hours as needed for wheezing or shortness of breath. 1 Inhaler 2 prn at prn  . alendronate (FOSAMAX) 70 MG tablet Take 70 mg by mouth once a week. Pt takes on Tuesday.   Take with a full glass of water on an empty stomach.   Past Week at Unknown time  . apixaban (ELIQUIS) 5 MG TABS tablet Take 1 tablet (5 mg total) by mouth 2 (two) times daily. 60 tablet 1 10/29/2015 at Unknown time  . aspirin EC 81 MG tablet Take 81 mg by mouth daily.   10/29/2015 at unknown  . cetirizine (ZYRTEC) 10 MG tablet Take 10 mg by mouth at bedtime.   10/29/2015 at Unknown time  . docusate sodium (COLACE) 100 MG capsule Take 1 capsule (100 mg total) by mouth 2 (two) times daily. 10 capsule 0   . DULoxetine (CYMBALTA) 30 MG capsule Take 90 mg by mouth daily.    10/29/2015 at unknown  . fentaNYL (DURAGESIC - DOSED MCG/HR) 75 MCG/HR  Place 1 patch (75 mcg total) onto the skin every 3 (three) days. 5 patch 0   . fluticasone (FLOVENT HFA) 220 MCG/ACT inhaler Inhale 1 puff into the lungs 2 (two) times daily.   10/29/2015 at unknown  . folic acid (FOLVITE) 1 MG tablet Take 1 mg by mouth daily.   10/29/2015 at unknown  . gabapentin (NEURONTIN) 800 MG tablet Take 800 mg by mouth 3 (three) times daily.   10/29/2015 at unknown  . HYDROcodone-acetaminophen (NORCO/VICODIN) 5-325 MG tablet Take 1-2 tablets by mouth every 4 (four) hours as needed for moderate pain. 30 tablet 0 prn at prn  . HYDROmorphone (DILAUDID) 2 MG tablet Take 1 tablet (2 mg total) by mouth every 4 (four) hours as needed for severe pain. 30 tablet 0   .  lidocaine-prilocaine (EMLA) cream Apply 1 application topically as needed (prior to accessing port).   prn at prn  . magnesium oxide (MAG-OX) 400 (241.3 Mg) MG tablet Take 400 mg by mouth at bedtime.   10/29/2015 at unknown  . metFORMIN (GLUCOPHAGE-XR) 750 MG 24 hr tablet Take 750 mg by mouth daily with breakfast.   10/29/2015 at unknown  . metoprolol tartrate (LOPRESSOR) 25 MG tablet Take 1 tablet (25 mg total) by mouth 2 (two) times daily. 60 tablet 0 10/29/2015 at Unknown time  . mirtazapine (REMERON) 45 MG tablet Take 45 mg by mouth at bedtime.   10/29/2015 at unknown  . omeprazole (PRILOSEC) 20 MG capsule Take 20 mg by mouth daily before breakfast.    10/29/2015 at unknown  . ondansetron (ZOFRAN) 8 MG tablet Take 8 mg by mouth every 8 (eight) hours as needed for nausea or vomiting.   prn at prn  . polyethylene glycol (MIRALAX / GLYCOLAX) packet Take 17 g by mouth daily. 14 each 0   . prochlorperazine (COMPAZINE) 10 MG tablet TAKE 1 TABLET ('10MG'$ ) BY MOUTH EVERY SIX HOURS AS NEEDED FOR NAUSEA OR VOMITING 30 tablet 1 prn at prn  . promethazine (PHENERGAN) 25 MG suppository Place 1 suppository (25 mg total) rectally every 6 (six) hours as needed for nausea or vomiting. 12 each 0 prn at prn  . simvastatin (ZOCOR) 20 MG tablet Take 20 mg by mouth at bedtime.    10/29/2015 at unknown  . traZODone (DESYREL) 150 MG tablet Take 150 mg by mouth at bedtime.   10/29/2015 at unknown   Scheduled:  . antiseptic oral rinse  7 mL Mouth Rinse BID  . [START ON 11/15/2015] aspirin EC  81 mg Oral Daily  . budesonide (PULMICORT) nebulizer solution  0.5 mg Nebulization BID  . docusate sodium  100 mg Oral BID  . DULoxetine  90 mg Oral Daily  . enoxaparin (LOVENOX) injection  70 mg Subcutaneous Q12H  . fentaNYL  75 mcg Transdermal Q72H  . folic acid  1 mg Oral Daily  . gabapentin  800 mg Oral TID  . insulin aspart  0-15 Units Subcutaneous TID WC  . insulin aspart  0-5 Units Subcutaneous QHS  . [START ON 11/15/2015] levofloxacin  (LEVAQUIN) IV  750 mg Intravenous Q24H  . levofloxacin (LEVAQUIN) IV  750 mg Intravenous Once  . magnesium oxide  400 mg Oral QHS  . metoprolol tartrate  25 mg Oral BID  . mirtazapine  45 mg Oral QHS  . pantoprazole  40 mg Oral Daily  . polyethylene glycol  17 g Oral Daily  . simvastatin  20 mg Oral QHS  . traZODone  150 mg Oral QHS  .  vancomycin  750 mg Intravenous Q12H    Assessment: 60 y/o F with a h/o PE in 5/17 on apixaban but not compliant possibly due to cost admitted with possible NSTEMI.   Goal of Therapy:  Anti-Xa level 0.6-1 units/ml 4hrs after LMWH dose given Monitor platelets by anticoagulation protocol: Yes   Plan:  Lovenox 1 mg/kg q 12 hours.   Ulice Dash D 11/14/2015,2:12 PM

## 2015-11-14 NOTE — Progress Notes (Signed)
Admitting physician, Dr. Adrian Prince, initially stated that pt could be appropriately admitted to telemetry; however, felt more comfortable with the pt coming to the ICU since there are physicians on the unit and she would not be able to assess the pt's condition until later in the morning. Pt noted to be on an ammio drip and stable at this time.

## 2015-11-14 NOTE — ED Notes (Signed)
Patient transported to CT 

## 2015-11-14 NOTE — Progress Notes (Signed)
[facility] NAME: Charlene Williams    MR#:  [id number]  DATE OF BIRTH:  [birth year]  SUBJECTIVE:  CHIEF COMPLAINT:   Chief Complaint  Patient presents with  . Back Pain  The patient is 60 year old female with past medical history significant for history of metastatic lung cancer, COPD, on home oxygen, obese mellitus, hyperlipidemia, hiatal hernia, hypertension, obstructive sleep apnea, who presents to the hospital with complaints of chest and back pain. The patient was also noted to be confused. Labs revealed hyponatremia, hypercalcemia, hypokalemia, elevated troponin, leukocytosis. Patient was noted to be in V. tach, nonsustained, initiated on amiodarone intravenous drip. She remains confused. Radiologic studies revealed no PE, pulmonary opacities in the right lung, worrisome for inflammatory process, questionable atypical 8 edema, malignancy in left hemithorax and metastatic disease was also noted, had a CT without contrast revealed no acute intracranial abnormality, skull metastases were noted. Patient complains of severe abdominal and chest pain.  Review of Systems  Unable to perform ROS: Mental status change    VITAL SIGNS: Blood pressure 130/77, pulse (!) 31, temperature 97.6 F (36.4 C), temperature source Oral, resp. rate 19, height '5\' 2"'$  (1.575 m), weight 66.2 kg (145 lb 15.1 oz), SpO2 100 %.  PHYSICAL EXAMINATION:   GENERAL:  60 y.o.-year-old patient lying in the bed in mild to moderate distress due to pain in chest and abdomen, tachypneic, uncomfortable, restless.  EYES: Pupils equal, round, reactive to light and accommodation. No scleral icterus. Extraocular muscles intact.  HEENT: Head atraumatic, normocephalic. Oropharynx and nasopharynx clear.  NECK:  Supple, no jugular venous distention. No thyroid enlargement, no tenderness.  LUNGS: Diminished breath sounds bilaterally, no wheezing, few rales,rhonchi , but no  crepitations noted. Intermittent use of accessory muscles of respiration, tachypneic, restless.  CARDIOVASCULAR: S1, S2 normal. No murmurs, rubs, or gallops.  ABDOMEN: Soft, diffusely tender, no rebound or guarding was noted, nondistended. Bowel sounds present. No organomegaly or mass.  EXTREMITIES: No pedal edema, cyanosis, or clubbing.  NEUROLOGIC: Cranial nerves II through XII are intact. Muscle strength 5/5 in all extremities. Sensation intact. Gait not checked.  PSYCHIATRIC: The patient is alert , disoriented, confused, restless  SKIN: No obvious rash, lesion, or ulcer.   ORDERS/RESULTS REVIEWED:   CBC  Recent Labs Lab 11/14/15 0337  WBC 20.1*  HGB 10.7*  HCT 32.2*  PLT 242  MCV 80.9  MCH 26.9  MCHC 33.2  RDW 22.7*   ------------------------------------------------------------------------------------------------------------------  Chemistries   Recent Labs Lab 11/14/15 0337 11/14/15 1002  NA 134*  --   K 3.3*  --   CL 94*  --   CO2 25  --   GLUCOSE 118*  --   BUN 18  --   CREATININE 0.65  --   CALCIUM 10.6*  --   MG  --  2.1   ------------------------------------------------------------------------------------------------------------------ estimated creatinine clearance is 65.9 mL/min (by C-G formula based on SCr of 0.8 mg/dL). ------------------------------------------------------------------------------------------------------------------ No results for input(s): TSH, T4TOTAL, T3FREE, THYROIDAB in the last 72 hours.  Invalid input(s): FREET3  Cardiac Enzymes  Recent Labs Lab 11/14/15 0337 11/14/15 1002  TROPONINI 0.11* 0.09*   ------------------------------------------------------------------------------------------------------------------ Invalid input(s): POCBNP ---------------------------------------------------------------------------------------------------------------  RADIOLOGY: Dg Chest 2 View  Result Date: 11/14/2015 CLINICAL DATA:  60  y/o F; lung cancer with metastasis to spine. Back pain without relief. EXAM: CHEST  2 VIEW COMPARISON:  Concurrent thoracic spine radiographs. Chest CT dated 10/31/2015. FINDINGS: Elevated left hemidiaphragm and consolidation of the left  mid and lower lung zones as well as diffuse left-sided pleural thickening is without significant interval change in comparison with the prior CT of the chest. Cardiac silhouette is stable. Port catheter with tip projecting over the mid SVC. No consolidation in the right lung. There are several lucencies in the ribs bilaterally corresponding to osseous metastasis on prior CT. New mildly displaced right lateral fourth and sixth rib fractures. Healed right posterior fifth rib fracture. Previously identified pathologic fractures at right 10 and left 3 and 7 are not clearly appreciated. IMPRESSION: 1. Stable elevated left hemidiaphragm, left mid and lower lung zone consolidations, and left-sided pleural thickening. 2. Mildly displaced right lateral T4 and T6 rib fractures are new. Additional pathologic fractures identified on prior CT may are not clearly appreciated. Electronically Signed   By: Kristine Garbe M.D.   On: 11/14/2015 04:26   Dg Thoracic Spine 2 View  Result Date: 11/14/2015 CLINICAL DATA:  60 y/o F; lung cancer with metastasis to the spine. Back pain without relief. EXAM: THORACIC SPINE 2 VIEWS COMPARISON:  Chest CT dated 10/31/2015. Concurrent radiographs of the chest. FINDINGS: Vertebral body heights are preserved. No acute fracture is identified. Multiple osseous lesions are better characterized on the prior CT of the chest. Port catheter with tip projecting over the mid SVC. Moderate degenerative changes of the mid thoracic spine. IMPRESSION: No acute fracture or dislocation is identified. Multiple osseous metastasis are better characterized on the prior CT of the chest. Electronically Signed   By: Kristine Garbe M.D.   On: 11/14/2015 04:18    Dg Abd 1 View  Result Date: 11/14/2015 CLINICAL DATA:  Left upper quadrant pain onset today. EXAM: ABDOMEN - 1 VIEW COMPARISON:  None. FINDINGS: Nonobstructive bowel gas pattern. Oral contrast material noted within the renal collecting systems and bladder. Bilateral tubal ligation clips noted. No free air organomegaly. IMPRESSION: No acute findings. Electronically Signed   By: Rolm Baptise M.D.   On: 11/14/2015 11:46   Ct Head Wo Contrast  Result Date: 11/14/2015 CLINICAL DATA:  60 y/o F; history of lung cancer with metastasis to the spine. Patient is presenting with confusion. EXAM: CT HEAD WITHOUT CONTRAST TECHNIQUE: Contiguous axial images were obtained from the base of the skull through the vertex without intravenous contrast. COMPARISON:  MRI of the brain dated 11/01/2015 and CT of the head dated 07/29/2015. FINDINGS: Brain: No evidence of acute infarction, hemorrhage, hydrocephalus, extra-axial collection or mass lesion/mass effect. 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 Vascular: No hyperdense vessel. Mild internal carotid artery calcifications. Skull: There are multiple lucent lesions to the skull the largest of which are seen in the right parietal bone and within the clivus consistent with osseous metastatic disease. No gross interval change in comparison with prior MRI given differences in technique. Sinuses/Orbits: No acute finding. Other: None. IMPRESSION: 1. No acute intracranial abnormality. Stable small chronic right parietal infarct. 2. 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 Electronically Signed   By: Kristine Garbe M.D.   On: 11/14/2015 05:24   Ct Angio Chest Pe W Or Wo Contrast  Result Date: 11/14/2015 CLINICAL DATA:  Chest pain EXAM: CT ANGIOGRAPHY CHEST WITH CONTRAST TECHNIQUE: Multidetector CT imaging of the chest was performed using the standard protocol during bolus administration of intravenous contrast. Multiplanar CT image  reconstructions and MIPs were obtained to evaluate the vascular anatomy. CONTRAST:  75 cc Isovue 300 COMPARISON:  10/31/2015 FINDINGS: Motion artifact degrades the study. There are no  obvious filling defects in the pulmonary arterial tree to suggest acute pulmonary thromboembolism. Pleural and parenchymal disease throughout the left hemi thorax is stable compatible with advanced malignancy. Left subclavian Port-A-Cath with its tip in the SVC is stable Patchy indeterminate pulmonary opacities have developed throughout the right mid and lower lung. Bony metastatic disease is stable. No pneumothorax. Atherosclerotic changes throughout the mediastinum are stable. Review of the MIP images confirms the above findings. IMPRESSION: No evidence of acute pulmonary thromboembolism. Exam is limited by motion artifact Patchy pulmonary opacities of developed in the right lung worrisome for an inflammatory process. Also consider atypical edema. Stable advance malignancy within the left hemi thorax and metastatic disease. Electronically Signed   By: Marybelle Killings M.D.   On: 11/14/2015 07:59    EKG:  Orders placed or performed during the hospital encounter of 11/14/15  . ED EKG  . ED EKG    ASSESSMENT AND PLAN:  Principal Problem:   Non-STEMI (non-ST elevated myocardial infarction) [facility]) Active Problems:   Community acquired pneumonia #1. Elevated troponin, likely demand ischemia due to nonsustained V. tach, continue patient on amiodarone, heart rate fluctuates between 30s to 120s, get cardiologist involved for recommendations #2. Abdominal and chest pain, likely due to metastatic disease, supportive therapy , CT chest did show possible inflammation versus atypical edema, questionable due to V. tach, abdominal x-ray revealed no constipation, patient had CT scan of abdomen and pelvis recently in August 2017, revealing progression metastatic skeletal disease, fractured ribs, possible gastritis, initiate patient on PPI,  Carafate. Get palliative care involved for recommendations as well.  #3. Bacterial pneumonia, continue patient on levofloxacin and vancomycin, get sputum cultures if possible #4. Leukocytosis, follow with therapy   #5. Hypokalemia, supplementing orally, magnesium level was checked, normal #6. 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   DRUG ALLERGIES:  Allergies  Allergen Reactions  . Ampicillin Hives and Other (See Comments)    Has patient had a PCN reaction causing immediate rash, facial/tongue/throat swelling, SOB or lightheadedness with hypotension: No Has patient had a PCN reaction causing severe rash involving mucus membranes or skin necrosis: No Has patient had a PCN reaction that required hospitalization No Has patient had a PCN reaction occurring within the last 10 years: Yes If all of the above answers are "NO", then may proceed with Cephalosporin use.  Marland Kitchen Penicillins Hives and Other (See Comments)    Has patient had a PCN reaction causing immediate rash, facial/tongue/throat swelling, SOB or lightheadedness with hypotension: No Has patient had a PCN reaction causing severe rash involving mucus membranes or skin necrosis: No Has patient had a PCN reaction that required hospitalization No Has patient had a PCN reaction occurring within the last 10 years: Yes If all of the above answers are "NO", then may proceed with Cephalosporin use.    CODE STATUS:     Code Status Orders        Start     Ordered   11/14/15 0950  Full code  Continuous     11/14/15 0949    Code Status History    Date Active Date Inactive Code Status Order ID Comments User Context   10/31/2015  5:51 PM 11/02/2015 12:06 PM Full Code [id number]  Henreitta Leber, MD Inpatient   10/24/2015 10:09 AM 10/25/2015  4:43 PM Full Code [id number]  Harrie Foreman, MD ED   08/10/2015  1:44 AM 08/12/2015  4:45 PM Full Code [id number]  Lance Coon, MD Inpatient   07/29/2015  5:56 PM 07/30/2015  4:42 PM Full Code [id number]  Gladstone Lighter, MD Inpatient   07/11/2015  5:25 PM 07/19/2015  6:21 PM Full Code [id number]  Henreitta Leber, MD Inpatient   06/25/2015  6:35 PM 06/27/2015  4:00 PM Full Code [id number]  Theodoro Grist, MD ED      TOTAL TIME TAKING CARE OF THIS PATIENT: 45  minutes.    Theodoro Grist M.D on 11/14/2015 at 2:49 PM  Between 7am to 6pm - Pager - [phone]  After 6pm go to [email or web address] - password [email or web address] [facility]  [place] [facility]  Office  [phone]  CC: Primary care physician; Kasandra Knudsen, NP

## 2015-11-14 NOTE — Progress Notes (Signed)
Charlene Williams is a 60 y.o. female  [id number]  Primary Cardiologist: Neoma Laming Reason for Consultation: Elevated troponin and non-STEMI  HPI: This is a 60 year old African-American female with past medical history of metastatic lung cancer, COPD, on home oxygen and type 2 diabetes mellitus hyperlipidemia hypertension sleep apnea presented to the hospital with back pain. Blood test was done which showed mildly elevated troponin and some pleuritic chest pain.   Review of Systems: Patient does have some pleuritic chest pain but no orthopnea or PND. Patient does have some back pain.   Past Medical History:  Diagnosis Date  . Aneurysm ([place])   . Chest pain, pleuritic   . COPD (chronic obstructive pulmonary disease) ([place])    on home oxygen  . Depression   . Diabetes mellitus without complication ([place])   . Family history of chronic pain 01/17/2015  . Hiatal hernia   . Hypercholesteremia   . Hypertension   . Illicit drug use   . Lung cancer ([place])    adenocarcinoma left lung  . Migraines   . Sleep apnea   . Spine disorder     Medications Prior to Admission  Medication Sig Dispense Refill  . albuterol (PROVENTIL HFA;VENTOLIN HFA) 108 (90 Base) MCG/ACT inhaler Inhale 2 puffs into the lungs every 6 (six) hours as needed for wheezing or shortness of breath. 1 Inhaler 2  . alendronate (FOSAMAX) 70 MG tablet Take 70 mg by mouth once a week. Pt takes on Tuesday.   Take with a full glass of water on an empty stomach.    Marland Kitchen apixaban (ELIQUIS) 5 MG TABS tablet Take 1 tablet (5 mg total) by mouth 2 (two) times daily. 60 tablet 1  . aspirin EC 81 MG tablet Take 81 mg by mouth daily.    . cetirizine (ZYRTEC) 10 MG tablet Take 10 mg by mouth at bedtime.    . docusate sodium (COLACE) 100 MG capsule Take 1 capsule (100 mg total) by mouth 2 (two) times daily. 10 capsule 0  . DULoxetine (CYMBALTA) 30 MG capsule Take 90 mg by mouth daily.     . fentaNYL (DURAGESIC - DOSED MCG/HR) 75 MCG/HR  Place 1 patch (75 mcg total) onto the skin every 3 (three) days. 5 patch 0  . fluticasone (FLOVENT HFA) 220 MCG/ACT inhaler Inhale 1 puff into the lungs 2 (two) times daily.    . folic acid (FOLVITE) 1 MG tablet Take 1 mg by mouth daily.    Marland Kitchen gabapentin (NEURONTIN) 800 MG tablet Take 800 mg by mouth 3 (three) times daily.    Marland Kitchen HYDROcodone-acetaminophen (NORCO/VICODIN) 5-325 MG tablet Take 1-2 tablets by mouth every 4 (four) hours as needed for moderate pain. 30 tablet 0  . HYDROmorphone (DILAUDID) 2 MG tablet Take 1 tablet (2 mg total) by mouth every 4 (four) hours as needed for severe pain. 30 tablet 0  . lidocaine-prilocaine (EMLA) cream Apply 1 application topically as needed (prior to accessing port).    . magnesium oxide (MAG-OX) 400 (241.3 Mg) MG tablet Take 400 mg by mouth at bedtime.    . metFORMIN (GLUCOPHAGE-XR) 750 MG 24 hr tablet Take 750 mg by mouth daily with breakfast.    . metoprolol tartrate (LOPRESSOR) 25 MG tablet Take 1 tablet (25 mg total) by mouth 2 (two) times daily. 60 tablet 0  . mirtazapine (REMERON) 45 MG tablet Take 45 mg by mouth at bedtime.    Marland Kitchen omeprazole (PRILOSEC) 20 MG capsule Take 20 mg  by mouth daily before breakfast.     . ondansetron (ZOFRAN) 8 MG tablet Take 8 mg by mouth every 8 (eight) hours as needed for nausea or vomiting.    . polyethylene glycol (MIRALAX / GLYCOLAX) packet Take 17 g by mouth daily. 14 each 0  . prochlorperazine (COMPAZINE) 10 MG tablet TAKE 1 TABLET ('10MG'$ ) BY MOUTH EVERY SIX HOURS AS NEEDED FOR NAUSEA OR VOMITING 30 tablet 1  . promethazine (PHENERGAN) 25 MG suppository Place 1 suppository (25 mg total) rectally every 6 (six) hours as needed for nausea or vomiting. 12 each 0  . simvastatin (ZOCOR) 20 MG tablet Take 20 mg by mouth at bedtime.     . traZODone (DESYREL) 150 MG tablet Take 150 mg by mouth at bedtime.       Marland Kitchen antiseptic oral rinse  7 mL Mouth Rinse BID  . [START ON 11/15/2015] aspirin EC  81 mg Oral Daily  . budesonide  (PULMICORT) nebulizer solution  0.5 mg Nebulization BID  . docusate sodium  100 mg Oral BID  . DULoxetine  90 mg Oral Daily  . enoxaparin (LOVENOX) injection  70 mg Subcutaneous Q12H  . fentaNYL  75 mcg Transdermal Q72H  . folic acid  1 mg Oral Daily  . gabapentin  800 mg Oral TID  . insulin aspart  0-15 Units Subcutaneous TID WC  . insulin aspart  0-5 Units Subcutaneous QHS  . [START ON 11/15/2015] levofloxacin (LEVAQUIN) IV  750 mg Intravenous Q24H  . magnesium oxide  400 mg Oral QHS  . metoprolol tartrate  25 mg Oral BID  . mirtazapine  45 mg Oral QHS  . pantoprazole  40 mg Oral Daily  . polyethylene glycol  17 g Oral Daily  . simvastatin  20 mg Oral QHS  . traZODone  150 mg Oral QHS  . vancomycin  750 mg Intravenous Q12H    Infusions: . sodium chloride 75 mL/hr at 11/14/15 1100  . amiodarone 30 mg/hr (11/14/15 1200)    Allergies  Allergen Reactions  . Ampicillin Hives and Other (See Comments)    Has patient had a PCN reaction causing immediate rash, facial/tongue/throat swelling, SOB or lightheadedness with hypotension: No Has patient had a PCN reaction causing severe rash involving mucus membranes or skin necrosis: No Has patient had a PCN reaction that required hospitalization No Has patient had a PCN reaction occurring within the last 10 years: Yes If all of the above answers are "NO", then may proceed with Cephalosporin use.  Marland Kitchen Penicillins Hives and Other (See Comments)    Has patient had a PCN reaction causing immediate rash, facial/tongue/throat swelling, SOB or lightheadedness with hypotension: No Has patient had a PCN reaction causing severe rash involving mucus membranes or skin necrosis: No Has patient had a PCN reaction that required hospitalization No Has patient had a PCN reaction occurring within the last 10 years: Yes If all of the above answers are "NO", then may proceed with Cephalosporin use.    Social History   Social History  . Marital status:  Divorced    Spouse name: N/A  . Number of children: N/A  . Years of education: N/A   Occupational History  . disabled    Social History Main Topics  . Smoking status: Former Smoker    Packs/day: 1.50    Years: 45.00    Types: Cigarettes    Quit date: 04/29/2015  . Smokeless tobacco: Never Used     Comment: Quit 2 months ago  .  Alcohol use No  . Drug use: No  . Sexual activity: Not on file   Other Topics Concern  . Not on file   Social History Narrative  . No narrative on file    Family History  Problem Relation Age of Onset  . Heart disease Mother   . Breast cancer Sister     PHYSICAL EXAM: Vitals:   11/14/15 1200 11/14/15 1300  BP:    Pulse: (!) 114 (!) 31  Resp: (!) 23 19  Temp:       Intake/Output Summary (Last 24 hours) at 11/14/15 1450 Last data filed at 11/14/15 1100  Gross per 24 hour  Intake           277.08 ml  Output              350 ml  Net           -72.92 ml    General:  Well appearing. No respiratory difficulty HEENT: normal Neck: supple. no JVD. Carotids 2+ bilat; no bruits. No lymphadenopathy or thryomegaly appreciated. Cor: PMI nondisplaced. Regular rate & rhythm. No rubs, gallops or murmurs. Lungs: clear Abdomen: soft, nontender, nondistended. No hepatosplenomegaly. No bruits or masses. Good bowel sounds. Extremities: no cyanosis, clubbing, rash, edema Neuro: alert & oriented x 3, cranial nerves grossly intact. moves all 4 extremities w/o difficulty. Affect pleasant.  DPO:EUMPN rhythm with no acute changes  Results for orders placed or performed during the hospital encounter of 11/14/15 (from the past 24 hour(s))  CBC     Status: Abnormal   Collection Time: 11/14/15  3:37 AM  Result Value Ref Range   WBC 20.1 (H) 3.6 - 11.0 K/uL   RBC 3.98 3.80 - 5.20 MIL/uL   Hemoglobin 10.7 (L) 12.0 - 16.0 g/dL   HCT 32.2 (L) 35.0 - 47.0 %   MCV 80.9 80.0 - 100.0 fL   MCH 26.9 26.0 - 34.0 pg   MCHC 33.2 32.0 - 36.0 g/dL   RDW 22.7 (H) 11.5 -  14.5 %   Platelets 242 150 - 440 K/uL  Basic metabolic panel     Status: Abnormal   Collection Time: 11/14/15  3:37 AM  Result Value Ref Range   Sodium 134 (L) 135 - 145 mmol/L   Potassium 3.3 (L) 3.5 - 5.1 mmol/L   Chloride 94 (L) 101 - 111 mmol/L   CO2 25 22 - 32 mmol/L   Glucose, Bld 118 (H) 65 - 99 mg/dL   BUN 18 6 - 20 mg/dL   Creatinine, Ser 0.65 0.44 - 1.00 mg/dL   Calcium 10.6 (H) 8.9 - 10.3 mg/dL   GFR calc non Af Amer >60 >60 mL/min   GFR calc Af Amer >60 >60 mL/min   Anion gap 15 5 - 15  Troponin I     Status: Abnormal   Collection Time: 11/14/15  3:37 AM  Result Value Ref Range   Troponin I 0.11 (HH) <0.03 ng/mL  Urinalysis complete, with microscopic ([facility] only)     Status: Abnormal   Collection Time: 11/14/15  5:33 AM  Result Value Ref Range   Color, Urine YELLOW (A) YELLOW   APPearance CLEAR (A) CLEAR   Glucose, UA NEGATIVE NEGATIVE mg/dL   Bilirubin Urine NEGATIVE NEGATIVE   Ketones, ur 1+ (A) NEGATIVE mg/dL   Specific Gravity, Urine 1.020 1.005 - 1.030   Hgb urine dipstick 1+ (A) NEGATIVE   pH 5.0 5.0 - 8.0   Protein, ur 30 (A) NEGATIVE  mg/dL   Nitrite NEGATIVE NEGATIVE   Leukocytes, UA NEGATIVE NEGATIVE   RBC / HPF 0-5 0 - 5 RBC/hpf   WBC, UA 0-5 0 - 5 WBC/hpf   Bacteria, UA NONE SEEN NONE SEEN   Squamous Epithelial / LPF NONE SEEN NONE SEEN  Glucose, capillary     Status: Abnormal   Collection Time: 11/14/15  9:21 AM  Result Value Ref Range   Glucose-Capillary 122 (H) 65 - 99 mg/dL  Troponin I     Status: Abnormal   Collection Time: 11/14/15 10:02 AM  Result Value Ref Range   Troponin I 0.09 (HH) <0.03 ng/mL  APTT     Status: Abnormal   Collection Time: 11/14/15 10:02 AM  Result Value Ref Range   aPTT 45 (H) 24 - 36 seconds  Heparin level (unfractionated)     Status: Abnormal   Collection Time: 11/14/15 10:02 AM  Result Value Ref Range   Heparin Unfractionated <0.10 (L) 0.30 - 0.70 IU/mL  Magnesium     Status: None   Collection Time: 11/14/15  10:02 AM  Result Value Ref Range   Magnesium 2.1 1.7 - 2.4 mg/dL  Glucose, capillary     Status: Abnormal   Collection Time: 11/14/15 12:33 PM  Result Value Ref Range   Glucose-Capillary 138 (H) 65 - 99 mg/dL   Dg Chest 2 View  Result Date: 11/14/2015 CLINICAL DATA:  60 y/o F; lung cancer with metastasis to spine. Back pain without relief. EXAM: CHEST  2 VIEW COMPARISON:  Concurrent thoracic spine radiographs. Chest CT dated 10/31/2015. FINDINGS: Elevated left hemidiaphragm and consolidation of the left mid and lower lung zones as well as diffuse left-sided pleural thickening is without significant interval change in comparison with the prior CT of the chest. Cardiac silhouette is stable. Port catheter with tip projecting over the mid SVC. No consolidation in the right lung. There are several lucencies in the ribs bilaterally corresponding to osseous metastasis on prior CT. New mildly displaced right lateral fourth and sixth rib fractures. Healed right posterior fifth rib fracture. Previously identified pathologic fractures at right 10 and left 3 and 7 are not clearly appreciated. IMPRESSION: 1. Stable elevated left hemidiaphragm, left mid and lower lung zone consolidations, and left-sided pleural thickening. 2. Mildly displaced right lateral T4 and T6 rib fractures are new. Additional pathologic fractures identified on prior CT may are not clearly appreciated. Electronically Signed   By: Kristine Garbe M.D.   On: 11/14/2015 04:26   Dg Thoracic Spine 2 View  Result Date: 11/14/2015 CLINICAL DATA:  60 y/o F; lung cancer with metastasis to the spine. Back pain without relief. EXAM: THORACIC SPINE 2 VIEWS COMPARISON:  Chest CT dated 10/31/2015. Concurrent radiographs of the chest. FINDINGS: Vertebral body heights are preserved. No acute fracture is identified. Multiple osseous lesions are better characterized on the prior CT of the chest. Port catheter with tip projecting over the mid SVC.  Moderate degenerative changes of the mid thoracic spine. IMPRESSION: No acute fracture or dislocation is identified. Multiple osseous metastasis are better characterized on the prior CT of the chest. Electronically Signed   By: Kristine Garbe M.D.   On: 11/14/2015 04:18   Dg Abd 1 View  Result Date: 11/14/2015 CLINICAL DATA:  Left upper quadrant pain onset today. EXAM: ABDOMEN - 1 VIEW COMPARISON:  None. FINDINGS: Nonobstructive bowel gas pattern. Oral contrast material noted within the renal collecting systems and bladder. Bilateral tubal ligation clips noted. No free air organomegaly. IMPRESSION:  No acute findings. Electronically Signed   By: Rolm Baptise M.D.   On: 11/14/2015 11:46   Ct Head Wo Contrast  Result Date: 11/14/2015 CLINICAL DATA:  60 y/o F; history of lung cancer with metastasis to the spine. Patient is presenting with confusion. EXAM: CT HEAD WITHOUT CONTRAST TECHNIQUE: Contiguous axial images were obtained from the base of the skull through the vertex without intravenous contrast. COMPARISON:  MRI of the brain dated 11/01/2015 and CT of the head dated 07/29/2015. FINDINGS: Brain: No evidence of acute infarction, hemorrhage, hydrocephalus, extra-axial collection or mass lesion/mass effect. 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 Vascular: No hyperdense vessel. Mild internal carotid artery calcifications. Skull: There are multiple lucent lesions to the skull the largest of which are seen in the right parietal bone and within the clivus consistent with osseous metastatic disease. No gross interval change in comparison with prior MRI given differences in technique. Sinuses/Orbits: No acute finding. Other: None. IMPRESSION: 1. No acute intracranial abnormality. Stable small chronic right parietal infarct. 2. 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 Electronically Signed   By: Kristine Garbe M.D.   On: 11/14/2015 05:24   Ct Angio Chest  Pe W Or Wo Contrast  Result Date: 11/14/2015 CLINICAL DATA:  Chest pain EXAM: CT ANGIOGRAPHY CHEST WITH CONTRAST TECHNIQUE: Multidetector CT imaging of the chest was performed using the standard protocol during bolus administration of intravenous contrast. Multiplanar CT image reconstructions and MIPs were obtained to evaluate the vascular anatomy. CONTRAST:  75 cc Isovue 300 COMPARISON:  10/31/2015 FINDINGS: Motion artifact degrades the study. There are no obvious filling defects in the pulmonary arterial tree to suggest acute pulmonary thromboembolism. Pleural and parenchymal disease throughout the left hemi thorax is stable compatible with advanced malignancy. Left subclavian Port-A-Cath with its tip in the SVC is stable Patchy indeterminate pulmonary opacities have developed throughout the right mid and lower lung. Bony metastatic disease is stable. No pneumothorax. Atherosclerotic changes throughout the mediastinum are stable. Review of the MIP images confirms the above findings. IMPRESSION: No evidence of acute pulmonary thromboembolism. Exam is limited by motion artifact Patchy pulmonary opacities of developed in the right lung worrisome for an inflammatory process. Also consider atypical edema. Stable advance malignancy within the left hemi thorax and metastatic disease. Electronically Signed   By: Marybelle Killings M.D.   On: 11/14/2015 07:59     ASSESSMENT AND PLAN: Atypical chest pain with mildly elevated troponin in a setting with the patient having terminal metastatic lung cancer involving multi-organs and bones. Echocardiogram was done which showed normal left reticular systolic function and normal wall motion. I discussed with the daughter and it was decided that conservative management without invasive procedure would be done. Patient will have comfort care.  KHAN,SHAUKAT A

## 2015-11-14 NOTE — ED Notes (Signed)
Pt heart rate elevated 150s-170s, pt not complaining of chest pain, Dr. Dahlia Client notified of heart rate. Awaiting new orders.

## 2015-11-14 NOTE — H&P (Signed)
[facility] NAME: Charlene Williams    MR#:  [id number]  DATE OF BIRTH:  [birth year]  DATE OF ADMISSION:  11/14/2015  PRIMARY CARE PHYSICIAN: Kasandra Knudsen, NP   REQUESTING/REFERRING PHYSICIAN:   CHIEF COMPLAINT:   Chief Complaint  Patient presents with  . Back Pain    HISTORY OF PRESENT ILLNESS: Charlene Williams  is a 60 y.o. female with a known history of Metastatic lung cancer, COPD on home oxygen, diabetes mellitus type 2, hyperlipidemia, hiatal hernia, hypertension, sleep apnea presented to the emergency room with chest pain and back pain. Patient has back pain present as 7 days and see aching in nature 5 out of 10 on a scale of 1-10. 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 She also complains of pain in the chest which is located in the substernal area was sharp in nature 6 out of 10 on a scale of 1-10. In the emergency room patient was evaluated and her first set of troponin was abnormal around 0.11. Patient's heart rate was fluctuating anywhere between 80 and 130 bpm. She had runs of  ventricular tachycardia which was nonsustained. Patient was started on IV amiodarone drip after a loading dose in the emergency room. Chest x-ray during the workup showed consolidation in the left lung. No complaints of any fever or chills. Patient has increased pain in chest on palpation.Has occasional cough. No complaints of orthopnea or proximal nocturnal dyspnea. No bowel or bladder incontinence. Hospitalist service was consulted for further care of the patient.  PAST MEDICAL HISTORY:   Past Medical History:  Diagnosis Date  . Aneurysm ([place])   . Chest pain, pleuritic   . COPD (chronic obstructive pulmonary disease) ([place])    on home oxygen  . Depression   . Diabetes mellitus without complication ([place])   . Family history of chronic pain 01/17/2015  . Hiatal hernia   . Hypercholesteremia   . Hypertension   .  Illicit drug use   . Lung cancer ([place])    adenocarcinoma left lung  . Migraines   . Sleep apnea   . Spine disorder     PAST SURGICAL HISTORY: Past Surgical History:  Procedure Laterality Date  . ABDOMINAL HYSTERECTOMY    . EYE SURGERY    . HERNIA REPAIR    . PORTACATH PLACEMENT Left 07/14/2015   Procedure: INSERTION PORT-A-CATH;  Surgeon: Nestor Lewandowsky, MD;  Location: [facility] ORS;  Service: General;  Laterality: Left;  . REMOVAL OF PLEURAL DRAINAGE CATHETER N/A 07/18/2015   Procedure: REMOVAL OF PLEURAL DRAINAGE CATHETER;  Surgeon: Nestor Lewandowsky, MD;  Location: [facility] ORS;  Service: Thoracic;  Laterality: N/A;  . VIDEO ASSISTED THORACOSCOPY (VATS)/THOROCOTOMY Left 07/14/2015   Procedure: VIDEO ASSISTED THORACOSCOPY (VATS), pleural biopsy;  Surgeon: Nestor Lewandowsky, MD;  Location: [facility] ORS;  Service: General;  Laterality: Left;    SOCIAL HISTORY:  Social History  Substance Use Topics  . Smoking status: Former Smoker    Packs/day: 1.50    Years: 45.00    Types: Cigarettes    Quit date: 04/29/2015  . Smokeless tobacco: Never Used     Comment: Quit 2 months ago  . Alcohol use No    FAMILY HISTORY:  Family History  Problem Relation Age of Onset  . Heart disease Mother   . Breast cancer Sister     DRUG ALLERGIES:  Allergies  Allergen Reactions  . Ampicillin Hives and Other (See Comments)  Has patient had a PCN reaction causing immediate rash, facial/tongue/throat swelling, SOB or lightheadedness with hypotension: No Has patient had a PCN reaction causing severe rash involving mucus membranes or skin necrosis: No Has patient had a PCN reaction that required hospitalization No Has patient had a PCN reaction occurring within the last 10 years: Yes If all of the above answers are "NO", then may proceed with Cephalosporin use.  Marland Kitchen Penicillins Hives and Other (See Comments)    Has patient had a PCN reaction causing immediate rash, facial/tongue/throat swelling, SOB or lightheadedness with  hypotension: No Has patient had a PCN reaction causing severe rash involving mucus membranes or skin necrosis: No Has patient had a PCN reaction that required hospitalization No Has patient had a PCN reaction occurring within the last 10 years: Yes If all of the above answers are "NO", then may proceed with Cephalosporin use.    REVIEW OF SYSTEMS:   CONSTITUTIONAL: No fever, has weakness.  EYES: No blurred or double vision.  EARS, NOSE, AND THROAT: No tinnitus or ear pain.  RESPIRATORY: occasional  cough, no shortness of breath, wheezing or hemoptysis.  CARDIOVASCULAR: has chest pain, no orthopnea, edema.  GASTROINTESTINAL: No nausea, vomiting, diarrhea or abdominal pain.  GENITOURINARY: No dysuria, hematuria.  ENDOCRINE: No polyuria, nocturia,  HEMATOLOGY: No anemia, easy bruising or bleeding SKIN: No rash or lesion. MUSCULOSKELETAL: Has back pain NEUROLOGIC: No tingling, numbness, weakness.  PSYCHIATRY: No anxiety or depression.   MEDICATIONS AT HOME:  Prior to Admission medications   Medication Sig Start Date End Date Taking? Authorizing Provider  albuterol (PROVENTIL HFA;VENTOLIN HFA) 108 (90 Base) MCG/ACT inhaler Inhale 2 puffs into the lungs every 6 (six) hours as needed for wheezing or shortness of breath. 08/12/15   Gladstone Lighter, MD  alendronate (FOSAMAX) 70 MG tablet Take 70 mg by mouth once a week. Pt takes on Tuesday.   Take with a full glass of water on an empty stomach.    Historical Provider, MD  apixaban (ELIQUIS) 5 MG TABS tablet Take 1 tablet (5 mg total) by mouth 2 (two) times daily. 07/30/15   Henreitta Leber, MD  aspirin EC 81 MG tablet Take 81 mg by mouth daily.    Historical Provider, MD  cetirizine (ZYRTEC) 10 MG tablet Take 10 mg by mouth at bedtime.    Historical Provider, MD  docusate sodium (COLACE) 100 MG capsule Take 1 capsule (100 mg total) by mouth 2 (two) times daily. 11/02/15   Fritzi Mandes, MD  DULoxetine (CYMBALTA) 30 MG capsule Take 90 mg by mouth  daily.     Historical Provider, MD  fentaNYL (DURAGESIC - DOSED MCG/HR) 75 MCG/HR Place 1 patch (75 mcg total) onto the skin every 3 (three) days. 11/02/15   Fritzi Mandes, MD  fluticasone (FLOVENT HFA) 220 MCG/ACT inhaler Inhale 1 puff into the lungs 2 (two) times daily.    Historical Provider, MD  folic acid (FOLVITE) 1 MG tablet Take 1 mg by mouth daily.    Historical Provider, MD  gabapentin (NEURONTIN) 800 MG tablet Take 800 mg by mouth 3 (three) times daily.    Historical Provider, MD  HYDROcodone-acetaminophen (NORCO/VICODIN) 5-325 MG tablet Take 1-2 tablets by mouth every 4 (four) hours as needed for moderate pain. 10/25/15   Henreitta Leber, MD  HYDROmorphone (DILAUDID) 2 MG tablet Take 1 tablet (2 mg total) by mouth every 4 (four) hours as needed for severe pain. 11/02/15   Fritzi Mandes, MD  lidocaine-prilocaine (EMLA) cream Apply  1 application topically as needed (prior to accessing port).    Historical Provider, MD  magnesium oxide (MAG-OX) 400 (241.3 Mg) MG tablet Take 400 mg by mouth at bedtime.    Historical Provider, MD  metFORMIN (GLUCOPHAGE-XR) 750 MG 24 hr tablet Take 750 mg by mouth daily with breakfast.    Historical Provider, MD  metoprolol tartrate (LOPRESSOR) 25 MG tablet Take 1 tablet (25 mg total) by mouth 2 (two) times daily. 06/27/15   Bettey Costa, MD  mirtazapine (REMERON) 45 MG tablet Take 45 mg by mouth at bedtime.    Historical Provider, MD  omeprazole (PRILOSEC) 20 MG capsule Take 20 mg by mouth daily before breakfast.     Historical Provider, MD  ondansetron (ZOFRAN) 8 MG tablet Take 8 mg by mouth every 8 (eight) hours as needed for nausea or vomiting.    Historical Provider, MD  polyethylene glycol (MIRALAX / GLYCOLAX) packet Take 17 g by mouth daily. 11/02/15   Fritzi Mandes, MD  prochlorperazine (COMPAZINE) 10 MG tablet TAKE 1 TABLET ('10MG'$ ) BY MOUTH EVERY SIX HOURS AS NEEDED FOR NAUSEA OR VOMITING 09/01/15   Cammie Sickle, MD  promethazine (PHENERGAN) 25 MG suppository Place  1 suppository (25 mg total) rectally every 6 (six) hours as needed for nausea or vomiting. 07/30/15   Henreitta Leber, MD  simvastatin (ZOCOR) 20 MG tablet Take 20 mg by mouth at bedtime.     Historical Provider, MD  traZODone (DESYREL) 150 MG tablet Take 150 mg by mouth at bedtime.    Historical Provider, MD      PHYSICAL EXAMINATION:   VITAL SIGNS: Blood pressure (!) 169/99, pulse (!) 102, temperature 98.3 F (36.8 C), temperature source Oral, resp. rate (!) 23, height '5\' 3"'$  (1.6 m), weight 70.3 kg (155 lb), SpO2 95 %.  GENERAL:  60 y.o.-year-old patient lying in the bed in mild distress.  EYES: Pupils equal, round, reactive to light and accommodation. No scleral icterus. Extraocular muscles intact.  HEENT: Head atraumatic, normocephalic. Oropharynx and nasopharynx clear.  NECK:  Supple, no jugular venous distention. No thyroid enlargement, no tenderness.  LUNGS: Normal breath sounds bilaterally, no wheezing, scattered rales in left lung. No use of accessory muscles of respiration.  CARDIOVASCULAR: S1, S2 normal. No murmurs, rubs, or gallops.  ABDOMEN: Soft, nontender, nondistended. Bowel sounds present. No organomegaly or mass.  EXTREMITIES: No pedal edema, cyanosis, or clubbing.  Trunk : tenderness over mid thoracic area of spine. No swelling or redness noted. NEUROLOGIC: Cranial nerves II through XII are intact. Muscle strength 5/5 in all extremities. Sensation intact. Gait not checked.  PSYCHIATRIC: The patient is alert and oriented x 3.  SKIN: No obvious rash, lesion, or ulcer.   LABORATORY PANEL:   CBC  Recent Labs Lab 11/14/15 0337  WBC 20.1*  HGB 10.7*  HCT 32.2*  PLT 242  MCV 80.9  MCH 26.9  MCHC 33.2  RDW 22.7*   ------------------------------------------------------------------------------------------------------------------  Chemistries   Recent Labs Lab 11/14/15 0337  NA 134*  K 3.3*  CL 94*  CO2 25  GLUCOSE 118*  BUN 18  CREATININE 0.65  CALCIUM  10.6*   ------------------------------------------------------------------------------------------------------------------ estimated creatinine clearance is 69.5 mL/min (by C-G formula based on SCr of 0.8 mg/dL). ------------------------------------------------------------------------------------------------------------------ No results for input(s): TSH, T4TOTAL, T3FREE, THYROIDAB in the last 72 hours.  Invalid input(s): FREET3   Coagulation profile No results for input(s): INR, PROTIME in the last 168 hours. ------------------------------------------------------------------------------------------------------------------- No results for input(s): DDIMER in the last 72 hours. -------------------------------------------------------------------------------------------------------------------  Cardiac Enzymes  Recent Labs Lab 11/14/15 0337  TROPONINI 0.11*   ------------------------------------------------------------------------------------------------------------------ Invalid input(s): POCBNP  ---------------------------------------------------------------------------------------------------------------  Urinalysis    Component Value Date/Time   COLORURINE YELLOW (A) 11/14/2015 0533   APPEARANCEUR CLEAR (A) 11/14/2015 0533   APPEARANCEUR Clear 07/18/2012 1355   LABSPEC 1.020 11/14/2015 0533   LABSPEC >1.060 07/18/2012 1355   PHURINE 5.0 11/14/2015 0533   GLUCOSEU NEGATIVE 11/14/2015 0533   GLUCOSEU Negative 07/18/2012 1355   HGBUR 1+ (A) 11/14/2015 0533   BILIRUBINUR NEGATIVE 11/14/2015 0533   BILIRUBINUR Negative 07/18/2012 1355   KETONESUR 1+ (A) 11/14/2015 0533   PROTEINUR 30 (A) 11/14/2015 0533   NITRITE NEGATIVE 11/14/2015 0533   LEUKOCYTESUR NEGATIVE 11/14/2015 0533   LEUKOCYTESUR Negative 07/18/2012 1355     RADIOLOGY: Dg Chest 2 View  Result Date: 11/14/2015 CLINICAL DATA:  60 y/o F; lung cancer with metastasis to spine. Back pain without relief. EXAM:  CHEST  2 VIEW COMPARISON:  Concurrent thoracic spine radiographs. Chest CT dated 10/31/2015. FINDINGS: Elevated left hemidiaphragm and consolidation of the left mid and lower lung zones as well as diffuse left-sided pleural thickening is without significant interval change in comparison with the prior CT of the chest. Cardiac silhouette is stable. Port catheter with tip projecting over the mid SVC. No consolidation in the right lung. There are several lucencies in the ribs bilaterally corresponding to osseous metastasis on prior CT. New mildly displaced right lateral fourth and sixth rib fractures. Healed right posterior fifth rib fracture. Previously identified pathologic fractures at right 10 and left 3 and 7 are not clearly appreciated. IMPRESSION: 1. Stable elevated left hemidiaphragm, left mid and lower lung zone consolidations, and left-sided pleural thickening. 2. Mildly displaced right lateral T4 and T6 rib fractures are new. Additional pathologic fractures identified on prior CT may are not clearly appreciated. Electronically Signed   By: Kristine Garbe M.D.   On: 11/14/2015 04:26   Dg Thoracic Spine 2 View  Result Date: 11/14/2015 CLINICAL DATA:  60 y/o F; lung cancer with metastasis to the spine. Back pain without relief. EXAM: THORACIC SPINE 2 VIEWS COMPARISON:  Chest CT dated 10/31/2015. Concurrent radiographs of the chest. FINDINGS: Vertebral body heights are preserved. No acute fracture is identified. Multiple osseous lesions are better characterized on the prior CT of the chest. Port catheter with tip projecting over the mid SVC. Moderate degenerative changes of the mid thoracic spine. IMPRESSION: No acute fracture or dislocation is identified. Multiple osseous metastasis are better characterized on the prior CT of the chest. Electronically Signed   By: Kristine Garbe M.D.   On: 11/14/2015 04:18   Ct Head Wo Contrast  Result Date: 11/14/2015 CLINICAL DATA:  61 y/o F;  history of lung cancer with metastasis to the spine. Patient is presenting with confusion. EXAM: CT HEAD WITHOUT CONTRAST TECHNIQUE: Contiguous axial images were obtained from the base of the skull through the vertex without intravenous contrast. COMPARISON:  MRI of the brain dated 11/01/2015 and CT of the head dated 07/29/2015. FINDINGS: Brain: No evidence of acute infarction, hemorrhage, hydrocephalus, extra-axial collection or mass lesion/mass effect. 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 Vascular: No hyperdense vessel. Mild internal carotid artery calcifications. Skull: There are multiple lucent lesions to the skull the largest of which are seen in the right parietal bone and within the clivus consistent with osseous metastatic disease. No gross interval change in comparison with prior MRI given differences in technique. Sinuses/Orbits: No acute finding. Other: None. IMPRESSION:  1. No acute intracranial abnormality. Stable small chronic right parietal infarct. 2. 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 Electronically Signed   By: Kristine Garbe M.D.   On: 11/14/2015 05:24    EKG: Orders placed or performed during the hospital encounter of 11/14/15  . ED EKG  . ED EKG    IMPRESSION AND PLAN: 60 year old African can female patient with history of metastatic lung cancer, COPD on home oxygen, diabetes mellitus type 2, hypertension, hyperlipidemia presented to the emergency room with chest pain, back pain. Had runs of ventricular tachycardia in ER and started on amiodarone drip. Admitting diagnosis 1. Non ST elevation myocardial infarction 2. Back pain secondary to metastasis 3. Left lung pneumonia 4. Leukocytosis 5. Hypokalemia 6. Nonsustained ventricular tachycardia 7. Mild hyponatremia 8. Metastatic lung cancer Treatment plan Admit patient to stepdown unit Aspirin 81 mg orally daily Anticoagulate patient with full dose Lovenox  subcutaneously IV amiodarone drip for erythremia Cardiology consultation Start patient on IV vancomycin and IV Levaquin antibiotics Follow-up WBC count and potassium level Potassium supplementation IV fluid hydration Follow up sodium level Pain management.   All the records are reviewed and case discussed with ED provider. Management plans discussed with the patient, family and they are in agreement.  CODE STATUS:FULL Code Status History    Date Active Date Inactive Code Status Order ID Comments User Context   10/31/2015  5:51 PM 11/02/2015 12:06 PM Full Code [id number]  Henreitta Leber, MD Inpatient   10/24/2015 10:09 AM 10/25/2015  4:43 PM Full Code [id number]  Harrie Foreman, MD ED   08/10/2015  1:44 AM 08/12/2015  4:45 PM Full Code [id number]  Lance Coon, MD Inpatient   07/29/2015  5:56 PM 07/30/2015  4:42 PM Full Code [id number]  Gladstone Lighter, MD Inpatient   07/11/2015  5:25 PM 07/19/2015  6:21 PM Full Code [id number]  Henreitta Leber, MD Inpatient   06/25/2015  6:35 PM 06/27/2015  4:00 PM Full Code [id number]  Theodoro Grist, MD ED       TOTAL CRITICAL CARE TIME TAKING CARE OF THIS PATIENT: 57 minutes.    Saundra Shelling M.D on 11/14/2015 at 6:35 AM  Between 7am to 6pm - Pager - [phone]  After 6pm go to [email or web address] - password [email or web address] [facility]  [place] [facility] [place] [facility]  Office  [phone]  CC: Primary care physician; Kasandra Knudsen, NP

## 2015-11-14 NOTE — Progress Notes (Signed)
*  PRELIMINARY RESULTS* Echocardiogram 2D Echocardiogram has been performed.  Sherrie Sport 11/14/2015, 1:41 PM

## 2015-11-14 NOTE — Progress Notes (Signed)
Pharmacy Antibiotic Note  Charlene Williams is a 60 y.o. female admitted on 11/14/2015 with pneumonia.  Pharmacy has been consulted for vancomycin and Levaquin dosing.  Plan: DW 60kg  Vd 42L kei 0.063 hr-1  t1/12 11 hours Vancomycin 750 mg q 12 hours ordered with stacked dosing. Level before 5th dose. Goal trough 15-20.  Levaquin 750 mg q 24 hours ordered.  Height: '5\' 3"'$  (160 cm) Weight: 155 lb (70.3 kg) IBW/kg (Calculated) : 52.4  Temp (24hrs), Avg:98.3 F (36.8 C), Min:98.3 F (36.8 C), Max:98.3 F (36.8 C)   Recent Labs Lab 11/14/15 0337  WBC 20.1*  CREATININE 0.65    Estimated Creatinine Clearance: 69.5 mL/min (by C-G formula based on SCr of 0.8 mg/dL).    Allergies  Allergen Reactions  . Ampicillin Hives and Other (See Comments)    Has patient had a PCN reaction causing immediate rash, facial/tongue/throat swelling, SOB or lightheadedness with hypotension: No Has patient had a PCN reaction causing severe rash involving mucus membranes or skin necrosis: No Has patient had a PCN reaction that required hospitalization No Has patient had a PCN reaction occurring within the last 10 years: Yes If all of the above answers are "NO", then may proceed with Cephalosporin use.  Marland Kitchen Penicillins Hives and Other (See Comments)    Has patient had a PCN reaction causing immediate rash, facial/tongue/throat swelling, SOB or lightheadedness with hypotension: No Has patient had a PCN reaction causing severe rash involving mucus membranes or skin necrosis: No Has patient had a PCN reaction that required hospitalization No Has patient had a PCN reaction occurring within the last 10 years: Yes If all of the above answers are "NO", then may proceed with Cephalosporin use.    Antimicrobials this admission: vancomycin  >>  Levaquin  >>   Dose adjustments this admission:   Microbiology results: No micro  8/22 CXR: Left mid and lower consolidation.  Thank you for allowing pharmacy to  be a part of this patient's care.  Brendon Christoffel S 11/14/2015 6:52 AM

## 2015-11-14 NOTE — ED Triage Notes (Signed)
Pt to triage via w/c; lung CA with metastasis to spine; taking dilaudid without relief for back pain

## 2015-11-14 NOTE — Progress Notes (Signed)
Pt tx to RM 239 from CCU. Report received from [place], [place]. Pt and family oriented to room and unit. Tele box verified by RN and transporting NT. Sitter in place and family at bedside. No complaints of pain at this time just nausea upon arrival in which PRN med was given. Amio gtt remains at 16.85m/hr and HR 70-80s. Nursing will continue to assess.

## 2015-11-14 NOTE — Care Management Note (Addendum)
Case Management Note  Patient Details  Name: Charlene Williams MRN: [id number] Date of Birth: 1955-11-29  Subjective/Objective:                  Met with patient, her daughter Aniceto Boss [phone], and her sister Lenell Antu [phone] to discuss discharge planning. Patient is from home with her daughter Aniceto Boss. She just finished chemo at [facility] and has started 10- rounds of radiation/daily also at [facility] for lung cancer. Patient is on chronic O2 at home through [place]. She states she is followed by home health agency for PT but unsure what company- Aniceto Boss will call RNCM with that information. Patient usually ambulates with a cane; she does not have a walker available at home. Her PCP is with [facility]. She has private duty care 80 hours per week.  Action/Plan:   RNCM to continue to follow.   Expected Discharge Date:                  Expected Discharge Plan:     In-House Referral:     Discharge planning Services  CM Consult  Post Acute Care Choice:  Home Health Choice offered to:  Patient, Adult Children, Sibling  DME Arranged:    DME Agency:     HH Arranged:    [place] Agency:     Status of Service:  In process, will continue to follow  If discussed at Long Length of Stay Meetings, dates discussed:    Additional Comments: Patient is open to [facility] home health RN and PT.   Marshell Garfinkel, RN 11/14/2015, 11:30 AM

## 2015-11-15 ENCOUNTER — Inpatient Hospital Stay: Admit: 2015-11-15 | Payer: Medicare Other

## 2015-11-15 ENCOUNTER — Ambulatory Visit: Payer: Medicare Other

## 2015-11-15 ENCOUNTER — Inpatient Hospital Stay: Payer: Medicare Other | Attending: Internal Medicine

## 2015-11-15 DIAGNOSIS — Z803 Family history of malignant neoplasm of breast: Secondary | ICD-10-CM

## 2015-11-15 DIAGNOSIS — C3490 Malignant neoplasm of unspecified part of unspecified bronchus or lung: Secondary | ICD-10-CM

## 2015-11-15 DIAGNOSIS — Z789 Other specified health status: Secondary | ICD-10-CM

## 2015-11-15 DIAGNOSIS — F329 Major depressive disorder, single episode, unspecified: Secondary | ICD-10-CM

## 2015-11-15 DIAGNOSIS — J449 Chronic obstructive pulmonary disease, unspecified: Secondary | ICD-10-CM

## 2015-11-15 DIAGNOSIS — R634 Abnormal weight loss: Secondary | ICD-10-CM

## 2015-11-15 DIAGNOSIS — I10 Essential (primary) hypertension: Secondary | ICD-10-CM

## 2015-11-15 DIAGNOSIS — R0602 Shortness of breath: Secondary | ICD-10-CM

## 2015-11-15 DIAGNOSIS — R079 Chest pain, unspecified: Secondary | ICD-10-CM

## 2015-11-15 DIAGNOSIS — E7800 Pure hypercholesterolemia, unspecified: Secondary | ICD-10-CM

## 2015-11-15 DIAGNOSIS — R630 Anorexia: Secondary | ICD-10-CM

## 2015-11-15 DIAGNOSIS — C799 Secondary malignant neoplasm of unspecified site: Secondary | ICD-10-CM

## 2015-11-15 DIAGNOSIS — I214 Non-ST elevation (NSTEMI) myocardial infarction: Secondary | ICD-10-CM

## 2015-11-15 DIAGNOSIS — E119 Type 2 diabetes mellitus without complications: Secondary | ICD-10-CM

## 2015-11-15 DIAGNOSIS — K449 Diaphragmatic hernia without obstruction or gangrene: Secondary | ICD-10-CM

## 2015-11-15 DIAGNOSIS — Z8669 Personal history of other diseases of the nervous system and sense organs: Secondary | ICD-10-CM

## 2015-11-15 DIAGNOSIS — M539 Dorsopathy, unspecified: Secondary | ICD-10-CM

## 2015-11-15 DIAGNOSIS — G4730 Sleep apnea, unspecified: Secondary | ICD-10-CM

## 2015-11-15 DIAGNOSIS — Z87891 Personal history of nicotine dependence: Secondary | ICD-10-CM

## 2015-11-15 DIAGNOSIS — I472 Ventricular tachycardia: Secondary | ICD-10-CM

## 2015-11-15 DIAGNOSIS — C3492 Malignant neoplasm of unspecified part of left bronchus or lung: Secondary | ICD-10-CM

## 2015-11-15 LAB — LIPID PANEL
Cholesterol: 71 mg/dL (ref 0–200)
HDL: 27 mg/dL — AB (ref >40)
LDL CALC: 24 mg/dL (ref 0–99)
TRIGLYCERIDES: 102 mg/dL (ref <150)
Total CHOL/HDL Ratio: 2.6 RATIO
VLDL: 20 mg/dL (ref 0–40)

## 2015-11-15 LAB — CBC
HEMATOCRIT: 28.1 % — AB (ref 35.0–47.0)
Hemoglobin: 9.1 g/dL — ABNORMAL LOW (ref 12.0–16.0)
MCH: 26.8 pg (ref 26.0–34.0)
MCHC: 32.6 g/dL (ref 32.0–36.0)
MCV: 82.2 fL (ref 80.0–100.0)
Platelets: 219 10*3/uL (ref 150–440)
RBC: 3.41 MIL/uL — ABNORMAL LOW (ref 3.80–5.20)
RDW: 23 % — AB (ref 11.5–14.5)
WBC: 13.7 10*3/uL — ABNORMAL HIGH (ref 3.6–11.0)

## 2015-11-15 LAB — GLUCOSE, CAPILLARY
Glucose-Capillary: 99 mg/dL (ref 65–99)
Glucose-Capillary: 100 mg/dL — ABNORMAL HIGH (ref 65–99)
Glucose-Capillary: 97 mg/dL (ref 65–99)
Glucose-Capillary: 104 mg/dL — ABNORMAL HIGH (ref 65–99)

## 2015-11-15 LAB — BASIC METABOLIC PANEL
Anion gap: 8 (ref 5–15)
BUN: 11 mg/dL (ref 6–20)
CALCIUM: 9.4 mg/dL (ref 8.9–10.3)
CHLORIDE: 101 mmol/L (ref 101–111)
CO2: 26 mmol/L (ref 22–32)
Creatinine, Ser: 0.56 mg/dL (ref 0.44–1.00)
GFR calc Af Amer: >60 mL/min (ref >60)
GLUCOSE: 90 mg/dL (ref 65–99)
POTASSIUM: 4 mmol/L (ref 3.5–5.1)
Sodium: 135 mmol/L (ref 135–145)

## 2015-11-15 MED ORDER — MORPHINE SULFATE (CONCENTRATE) 10 MG/0.5ML PO SOLN: 5.0000 mg | ORAL | Status: DC | PRN

## 2015-11-15 MED ORDER — SODIUM CHLORIDE 0.9 % IV SOLN
INTRAVENOUS | Status: DC
  Administered 2015-11-15: 14:00:00 via INTRAVENOUS

## 2015-11-15 MED ORDER — LORAZEPAM 1 MG PO TABS: 1.0000 mg | ORAL_TABLET | Freq: Four times a day (QID) | ORAL | Status: DC | PRN

## 2015-11-15 MED ORDER — ENOXAPARIN SODIUM 80 MG/0.8ML ~~LOC~~ SOLN
65.0000 mg | Freq: Two times a day (BID) | SUBCUTANEOUS | Status: DC
  Administered 2015-11-15: 65 mg via SUBCUTANEOUS
  Filled 2015-11-15: qty 0.8

## 2015-11-15 MED ORDER — AMIODARONE HCL 200 MG PO TABS
400.0000 mg | ORAL_TABLET | Freq: Two times a day (BID) | ORAL | Status: DC
  Administered 2015-11-15 – 2015-11-16 (×3): 400 mg via ORAL
  Filled 2015-11-15 (×2): qty 2

## 2015-11-15 NOTE — Progress Notes (Addendum)
[facility] NAME: Charlene Williams    MR#:  [id number]  DATE OF BIRTH:  10/26/1955  SUBJECTIVE:  CHIEF COMPLAINT:   Chief Complaint  Patient presents with  . Back Pain  The patient is 60 year old female with past medical history significant for history of metastatic lung cancer, COPD, on home oxygen, obese mellitus, hyperlipidemia, hiatal hernia, hypertension, obstructive sleep apnea, who presents to the hospital with complaints of chest and back pain. The patient was also noted to be confused. Labs revealed hyponatremia, hypercalcemia, hypokalemia, elevated troponin, leukocytosis. Patient was noted to be in V. tach, nonsustained, initiated on amiodarone intravenous drip. She remains confused. Radiologic studies revealed no PE, pulmonary opacities in the right lung, worrisome for inflammatory process, questionable atypical  edema, malignancy in left hemithorax and metastatic disease was also noted, had a CT Head without contrast revealed no acute intracranial abnormality, skull metastases were noted. Patient complained of severe abdominal and chest pain, resolved now. Patient feels better today. She was seen by palliative care, decided to go home with hospice tomorrow.  Review of Systems  Unable to perform ROS: Mental status change  Constitutional: Negative for chills, fever and weight loss.  HENT: Negative for congestion.   Eyes: Negative for blurred vision and double vision.  Respiratory: Negative for cough, sputum production, shortness of breath and wheezing.   Cardiovascular: Negative for chest pain, palpitations, orthopnea, leg swelling and PND.  Gastrointestinal: Negative for abdominal pain, blood in stool, constipation, diarrhea, nausea and vomiting.  Genitourinary: Negative for dysuria, frequency, hematuria and urgency.  Musculoskeletal: Negative for falls.  Neurological: Negative for dizziness, tremors, focal weakness and  headaches.  Endo/Heme/Allergies: Does not bruise/bleed easily.  Psychiatric/Behavioral: Negative for depression. The patient does not have insomnia.     VITAL SIGNS: Blood pressure 95/67, pulse 78, temperature 98 F (36.7 C), temperature source Oral, resp. rate 20, height '5\' 2"'$  (1.575 m), weight 66.2 kg (145 lb 15.1 oz), SpO2 91 %.  PHYSICAL EXAMINATION:   GENERAL:  60 y.o.-year-old patient lying in the bed in no significant distress , more comfortable and conversive, smiles  EYES: Pupils equal, round, reactive to light and accommodation. No scleral icterus. Extraocular muscles intact.  HEENT: Head atraumatic, normocephalic. Oropharynx and nasopharynx clear.  NECK:  Supple, no jugular venous distention. No thyroid enlargement, no tenderness.  LUNGS: Diminished breath sounds bilaterally, no wheezing, few rales,rhonchi , but no crepitations noted. Not using accessory muscles of respiration, comfortable.  CARDIOVASCULAR: S1, S2 normal. No murmurs, rubs, or gallops.  ABDOMEN: Soft, diffusely tender, no rebound or guarding was noted, nondistended. Bowel sounds present. No organomegaly or mass.  EXTREMITIES: No pedal edema, cyanosis, or clubbing.  NEUROLOGIC: Cranial nerves II through XII are intact. Muscle strength 5/5 in all extremities. Sensation intact. Gait not checked.  PSYCHIATRIC: The patient is alert , oriented 3 SKIN: No obvious rash, lesion, or ulcer.   ORDERS/RESULTS REVIEWED:   CBC  Recent Labs Lab 11/14/15 0337 11/15/15 0417  WBC 20.1* 13.7*  HGB 10.7* 9.1*  HCT 32.2* 28.1*  PLT 242 219  MCV 80.9 82.2  MCH 26.9 26.8  MCHC 33.2 32.6  RDW 22.7* 23.0*   ------------------------------------------------------------------------------------------------------------------  Chemistries   Recent Labs Lab 11/14/15 0337 11/14/15 1002 11/15/15 0417  NA 134*  --  135  K 3.3*  --  4.0  CL 94*  --  101  CO2 25  --  26  GLUCOSE 118*  --  90  BUN 18  --  11  CREATININE  0.65  --  0.56  CALCIUM 10.6*  --  9.4  MG  --  2.1  --    ------------------------------------------------------------------------------------------------------------------ estimated creatinine clearance is 65.9 mL/min (by C-G formula based on SCr of 0.8 mg/dL). ------------------------------------------------------------------------------------------------------------------ No results for input(s): TSH, T4TOTAL, T3FREE, THYROIDAB in the last 72 hours.  Invalid input(s): FREET3  Cardiac Enzymes  Recent Labs Lab 11/14/15 1002 11/14/15 1554 11/14/15 2139  TROPONINI 0.09* 0.09* 0.07*   ------------------------------------------------------------------------------------------------------------------ Invalid input(s): POCBNP ---------------------------------------------------------------------------------------------------------------  RADIOLOGY: Dg Chest 2 View  Result Date: 11/14/2015 CLINICAL DATA:  60 y/o F; lung cancer with metastasis to spine. Back pain without relief. EXAM: CHEST  2 VIEW COMPARISON:  Concurrent thoracic spine radiographs. Chest CT dated 10/31/2015. FINDINGS: Elevated left hemidiaphragm and consolidation of the left mid and lower lung zones as well as diffuse left-sided pleural thickening is without significant interval change in comparison with the prior CT of the chest. Cardiac silhouette is stable. Port catheter with tip projecting over the mid SVC. No consolidation in the right lung. There are several lucencies in the ribs bilaterally corresponding to osseous metastasis on prior CT. New mildly displaced right lateral fourth and sixth rib fractures. Healed right posterior fifth rib fracture. Previously identified pathologic fractures at right 10 and left 3 and 7 are not clearly appreciated. IMPRESSION: 1. Stable elevated left hemidiaphragm, left mid and lower lung zone consolidations, and left-sided pleural thickening. 2. Mildly displaced right lateral T4 and T6 rib  fractures are new. Additional pathologic fractures identified on prior CT may are not clearly appreciated. Electronically Signed   By: Kristine Garbe M.D.   On: 11/14/2015 04:26   Dg Thoracic Spine 2 View  Result Date: 11/14/2015 CLINICAL DATA:  60 y/o F; lung cancer with metastasis to the spine. Back pain without relief. EXAM: THORACIC SPINE 2 VIEWS COMPARISON:  Chest CT dated 10/31/2015. Concurrent radiographs of the chest. FINDINGS: Vertebral body heights are preserved. No acute fracture is identified. Multiple osseous lesions are better characterized on the prior CT of the chest. Port catheter with tip projecting over the mid SVC. Moderate degenerative changes of the mid thoracic spine. IMPRESSION: No acute fracture or dislocation is identified. Multiple osseous metastasis are better characterized on the prior CT of the chest. Electronically Signed   By: Kristine Garbe M.D.   On: 11/14/2015 04:18   Dg Abd 1 View  Result Date: 11/14/2015 CLINICAL DATA:  Left upper quadrant pain onset today. EXAM: ABDOMEN - 1 VIEW COMPARISON:  None. FINDINGS: Nonobstructive bowel gas pattern. Oral contrast material noted within the renal collecting systems and bladder. Bilateral tubal ligation clips noted. No free air organomegaly. IMPRESSION: No acute findings. Electronically Signed   By: Rolm Baptise M.D.   On: 11/14/2015 11:46   Ct Head Wo Contrast  Result Date: 11/14/2015 CLINICAL DATA:  60 y/o F; history of lung cancer with metastasis to the spine. Patient is presenting with confusion. EXAM: CT HEAD WITHOUT CONTRAST TECHNIQUE: Contiguous axial images were obtained from the base of the skull through the vertex without intravenous contrast. COMPARISON:  MRI of the brain dated 11/01/2015 and CT of the head dated 07/29/2015. FINDINGS: Brain: No evidence of acute infarction, hemorrhage, hydrocephalus, extra-axial collection or mass lesion/mass effect. 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 Vascular: No hyperdense vessel. Mild internal carotid artery calcifications. Skull: There are multiple lucent lesions to the skull the largest of which are seen in the right parietal  bone and within the clivus consistent with osseous metastatic disease. No gross interval change in comparison with prior MRI given differences in technique. Sinuses/Orbits: No acute finding. Other: None. IMPRESSION: 1. No acute intracranial abnormality. Stable small chronic right parietal infarct. 2. 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 Electronically Signed   By: Kristine Garbe M.D.   On: 11/14/2015 05:24   Ct Angio Chest Pe W Or Wo Contrast  Result Date: 11/14/2015 CLINICAL DATA:  Chest pain EXAM: CT ANGIOGRAPHY CHEST WITH CONTRAST TECHNIQUE: Multidetector CT imaging of the chest was performed using the standard protocol during bolus administration of intravenous contrast. Multiplanar CT image reconstructions and MIPs were obtained to evaluate the vascular anatomy. CONTRAST:  75 cc Isovue 300 COMPARISON:  10/31/2015 FINDINGS: Motion artifact degrades the study. There are no obvious filling defects in the pulmonary arterial tree to suggest acute pulmonary thromboembolism. Pleural and parenchymal disease throughout the left hemi thorax is stable compatible with advanced malignancy. Left subclavian Port-A-Cath with its tip in the SVC is stable Patchy indeterminate pulmonary opacities have developed throughout the right mid and lower lung. Bony metastatic disease is stable. No pneumothorax. Atherosclerotic changes throughout the mediastinum are stable. Review of the MIP images confirms the above findings. IMPRESSION: No evidence of acute pulmonary thromboembolism. Exam is limited by motion artifact Patchy pulmonary opacities of developed in the right lung worrisome for an inflammatory process. Also consider atypical edema. Stable advance malignancy within the left hemi thorax and metastatic  disease. Electronically Signed   By: Marybelle Killings M.D.   On: 11/14/2015 07:59    EKG:  Orders placed or performed during the hospital encounter of 11/14/15  . ED EKG  . ED EKG    ASSESSMENT AND PLAN:  Principal Problem:   Non-STEMI (non-ST elevated myocardial infarction) [facility]) Active Problems:   Community acquired pneumonia #1. Elevated troponin, likely demand ischemia due to nonsustained V. tach, continue patient on amiodarone, Changed to oral, patient is in sinus rhythm now in the 70s to 80s, appreciate cardiologist recommendations, no interventions but palliative care. CT angiogram was negative for pulmonary embolism #2. Abdominal and chest pain, due to metastatic disease, supportive therapy is given, patient improved , CT chest did show possible inflammation versus atypical edema, likely due to V. tach, abdominal x-ray revealed no constipation, patient had CT scan of abdomen and pelvis recently in August 2017, revealing progression metastatic skeletal disease, fractured ribs, possible gastritis, continue patient on PPI, Carafate. Palliative care discussed care with family and patient and decided on return back home tomorrow with hospice.CT angiogram was negative for pulmonary embolism.  #3. Bacterial pneumonia, continue patient on levofloxacin, unable to get sputum cultures, MRSA PCR is negative, discontinued vancomycin #4. Leukocytosis, improved with therapy   #5. Hypokalemia, supplementede orally, resolved, magnesium level was checked, normal #6. 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 Hypotension, continue patient on low rate IV fluids at 40 cc an hour   Management plans discussed with the patient, family and they are in agreement.   DRUG ALLERGIES:  Allergies  Allergen Reactions  . Ampicillin Hives and Other (See Comments)    Has patient had a PCN reaction causing immediate rash,  facial/tongue/throat swelling, SOB or lightheadedness with hypotension: No Has patient had a PCN reaction causing severe rash involving mucus membranes or skin necrosis: No Has patient had a PCN reaction that required hospitalization No Has patient had a  PCN reaction occurring within the last 10 years: Yes If all of the above answers are "NO", then may proceed with Cephalosporin use.  Marland Kitchen Penicillins Hives and Other (See Comments)    Has patient had a PCN reaction causing immediate rash, facial/tongue/throat swelling, SOB or lightheadedness with hypotension: No Has patient had a PCN reaction causing severe rash involving mucus membranes or skin necrosis: No Has patient had a PCN reaction that required hospitalization No Has patient had a PCN reaction occurring within the last 10 years: Yes If all of the above answers are "NO", then may proceed with Cephalosporin use.    CODE STATUS:     Code Status Orders        Start     Ordered   11/14/15 0950  Full code  Continuous     11/14/15 0949    Code Status History    Date Active Date Inactive Code Status Order ID Comments User Context   10/31/2015  5:51 PM 11/02/2015 12:06 PM Full Code [id number]  Henreitta Leber, MD Inpatient   10/24/2015 10:09 AM 10/25/2015  4:43 PM Full Code [id number]  Harrie Foreman, MD ED   08/10/2015  1:44 AM 08/12/2015  4:45 PM Full Code [id number]  Lance Coon, MD Inpatient   07/29/2015  5:56 PM 07/30/2015  4:42 PM Full Code [id number]  Gladstone Lighter, MD Inpatient   07/11/2015  5:25 PM 07/19/2015  6:21 PM Full Code [id number]  Henreitta Leber, MD Inpatient   06/25/2015  6:35 PM 06/27/2015  4:00 PM Full Code [id number]  Theodoro Grist, MD ED      TOTAL TIME TAKING CARE OF THIS PATIENT: 40 minutes.  Emotional Support was provided, all questions were answered  Keerthana Vanrossum M.D on 11/15/2015 at 3:28 PM  Between 7am to 6pm - Pager - [phone]  After 6pm go to [email or web address] - password [email or web address] [facility]  [facility]   Office  [phone]  CC: Primary care physician; Kasandra Knudsen, NP

## 2015-11-15 NOTE — Progress Notes (Signed)
SUBJECTIVE: Patient is resting comfortably in bed, states she had a good night, and denies discomfort this morning.   Vitals:   11/14/15 1523 11/14/15 2009 11/14/15 2017 11/15/15 0729  BP: (!) 172/96  136/80   Pulse: 82  78   Resp: 20  18   Temp: 97.6 F (36.4 C)  98 F (36.7 C)   TempSrc: Oral     SpO2: 95% 93% 99% 98%  Weight:      Height:        Intake/Output Summary (Last 24 hours) at 11/15/15 0855 Last data filed at 11/14/15 1842  Gross per 24 hour  Intake          1238.48 ml  Output              350 ml  Net           888.48 ml    LABS: Basic Metabolic Panel:  Recent Labs  11/14/15 0337 11/14/15 1002 11/15/15 0417  NA 134*  --  135  K 3.3*  --  4.0  CL 94*  --  101  CO2 25  --  26  GLUCOSE 118*  --  90  BUN 18  --  11  CREATININE 0.65  --  0.56  CALCIUM 10.6*  --  9.4  MG  --  2.1  --    Liver Function Tests: No results for input(s): AST, ALT, ALKPHOS, BILITOT, PROT, ALBUMIN in the last 72 hours. No results for input(s): LIPASE, AMYLASE in the last 72 hours. CBC:  Recent Labs  11/14/15 0337 11/15/15 0417  WBC 20.1* 13.7*  HGB 10.7* 9.1*  HCT 32.2* 28.1*  MCV 80.9 82.2  PLT 242 219   Cardiac Enzymes:  Recent Labs  11/14/15 1002 11/14/15 1554 11/14/15 2139  TROPONINI 0.09* 0.09* 0.07*   BNP: Invalid input(s): POCBNP D-Dimer: No results for input(s): DDIMER in the last 72 hours. Hemoglobin A1C: No results for input(s): HGBA1C in the last 72 hours. Fasting Lipid Panel:  Recent Labs  11/15/15 0417  CHOL 71  HDL 27*  LDLCALC 24  TRIG 102  CHOLHDL 2.6   Thyroid Function Tests: No results for input(s): TSH, T4TOTAL, T3FREE, THYROIDAB in the last 72 hours.  Invalid input(s): FREET3 Anemia Panel: No results for input(s): VITAMINB12, FOLATE, FERRITIN, TIBC, IRON, RETICCTPCT in the last 72 hours.   PHYSICAL EXAM General: Well developed, well nourished, in no acute distress HEENT:  Normocephalic and atramatic Neck:  No JVD.   Lungs: Clear bilaterally to auscultation and percussion. Heart: HRRR . Normal S1 and S2 without gallops or murmurs.  Abdomen: Bowel sounds are positive, abdomen soft and non-tender  Msk:  Back normal, normal gait. Normal strength and tone for age. Extremities: No clubbing, cyanosis or edema.   Neuro: Alert and oriented X 3. Psych:  Good affect, responds appropriately  TELEMETRY: Normal sinus rhythm with rates in the 70s and 80s  ASSESSMENT AND PLAN: Atypical chest pain with mildly elevated troponins in the setting of the patient with terminal metastatic lung cancer involving multi organs and bones. Echocardiogram was done which showed normal LV systolic function and normal wall motion. She had a brief episode of nonsustained V. tach and also some bursts of atrial fibrillation which may contributed to the elevated troponins. She is now on amiodarone drip and rhythm is normal sinus. Advised to complete the IV load of amiodarone and switched to oral dosing at 400 mg daily. The patient is a poor candidate for  invasive procedures at this time and conservative management has been decided. Comfort care is indicated.  Principal Problem:   Non-STEMI (non-ST elevated myocardial infarction) [facility]) Active Problems:   Community acquired pneumonia    Daune Perch, NP 11/15/2015 8:55 AM

## 2015-11-15 NOTE — Consult Note (Signed)
Consultation Note Date: 11/15/2015   Patient Name: Charlene Williams  DOB: [birth year]  MRN: [id number]  Age / Sex: 60 y.o., female  PCP: Kasandra Knudsen, NP Referring Physician: Theodoro Grist, MD  Reason for Consultation: Establishing goals of care and Psychosocial/spiritual support  HPI/Patient Profile: 60 y.o. female   admitted on 11/14/2015 60 y.o. female with a known history of Metastatic lung cancer (skull, vertebrae and ribs), COPD on home oxygen, diabetes mellitus type 2, hyperlipidemia, hiatal hernia, hypertension, sleep apnea presented to the emergency room with chest pain and back pain.  Admitted through the ER with uncontrolled pain and cardiac arrhythmias.   unfortunately, per oncology   patient  Is unable to tolerate any further chemotherapy/radiation therapies and is recommending hospice.  Patient faces advanced directive decisions and anticipatory care needs.   Clinical Assessment and Goals of Care:   This NP Wadie Lessen reviewed medical records, received report from team, assessed the patient and then meet at the patient's bedside along with her daughter, father and grandson to discuss diagnosis, prognosis, GOC, EOL wishes disposition and options.  A detailed discussion was had today regarding advanced directives.  Concepts specific to code status, artifical feeding and hydration, continued IV antibiotics and rehospitalization was had.  The difference between a aggressive medical intervention path  and a palliative comfort care path for this patient at this time was had.  Values and goals of care important to patient and family were attempted to be elicited.  Concept of Hospice and Palliative Care were discussed  Natural trajectory and expectations at EOL were discussed.  Questions and concerns addressed.   Family encouraged to call with questions or concerns.  PMT will continue to support  holistically.    SUMMARY OF RECOMMENDATIONS    Code Status/Advance Care Planning:  DNR-docuemnted today    Symptom Management:   Pain/: Fentanyl patch and oxycodone   Palliative Prophylaxis:   Aspiration, Bowel Regimen, Frequent Pain Assessment and Oral Care   Psycho-social/Spiritual:   Desire for further Chaplaincy support:yes Additional Recommendations: information on hospice  Prognosis:   Less than six months  Discharge Planning: Home with hospice     Primary Diagnoses: Present on Admission: . Non-STEMI (non-ST elevated myocardial infarction) ([place]) . Community acquired pneumonia   I have reviewed the medical record, interviewed the patient and family, and examined the patient. The following aspects are pertinent.  Past Medical History:  Diagnosis Date  . Aneurysm ([place])   . Chest pain, pleuritic   . COPD (chronic obstructive pulmonary disease) ([place])    on home oxygen  . Depression   . Diabetes mellitus without complication ([place])   . Family history of chronic pain 01/17/2015  . Hiatal hernia   . Hypercholesteremia   . Hypertension   . Illicit drug use   . Lung cancer ([place])    adenocarcinoma left lung  . Migraines   . Sleep apnea   . Spine disorder    Social History   Social History  . Marital status: Divorced  Spouse name: N/A  . Number of children: N/A  . Years of education: N/A   Occupational History  . disabled    Social History Main Topics  . Smoking status: Former Smoker    Packs/day: 1.50    Years: 45.00    Types: Cigarettes    Quit date: 04/29/2015  . Smokeless tobacco: Never Used     Comment: Quit 2 months ago  . Alcohol use No  . Drug use: No  . Sexual activity: Not Asked   Other Topics Concern  . None   Social History Narrative  . None   Family History  Problem Relation Age of Onset  . Heart disease Mother   . Breast cancer Sister    Scheduled Meds: . amiodarone  400 mg Oral BID  . antiseptic oral rinse  7 mL  Mouth Rinse BID  . aspirin EC  81 mg Oral Daily  . budesonide (PULMICORT) nebulizer solution  0.5 mg Nebulization BID  . docusate sodium  100 mg Oral BID  . DULoxetine  90 mg Oral Daily  . enoxaparin (LOVENOX) injection  65 mg Subcutaneous Q12H  . fentaNYL  75 mcg Transdermal Q72H  . folic acid  1 mg Oral Daily  . gabapentin  800 mg Oral TID  . insulin aspart  0-15 Units Subcutaneous TID WC  . insulin aspart  0-5 Units Subcutaneous QHS  . levofloxacin (LEVAQUIN) IV  750 mg Intravenous Q24H  . magnesium oxide  400 mg Oral QHS  . metoprolol tartrate  25 mg Oral BID  . mirtazapine  45 mg Oral QHS  . pantoprazole  40 mg Oral Daily  . polyethylene glycol  17 g Oral Daily  . simvastatin  20 mg Oral QHS  . sucralfate  1 g Oral TID WC & HS  . traZODone  150 mg Oral QHS  . vancomycin  750 mg Intravenous Q12H   Continuous Infusions: . 0.9 % NaCl with KCl 20 mEq / L 75 mL/hr at 11/15/15 0552   PRN Meds:.acetaminophen, albuterol, HYDROcodone-acetaminophen, nitroGLYCERIN, ondansetron (ZOFRAN) IV Medications Prior to Admission:  Prior to Admission medications   Medication Sig Start Date End Date Taking? Authorizing Provider  albuterol (PROVENTIL HFA;VENTOLIN HFA) 108 (90 Base) MCG/ACT inhaler Inhale 2 puffs into the lungs every 6 (six) hours as needed for wheezing or shortness of breath. 08/12/15   Gladstone Lighter, MD  alendronate (FOSAMAX) 70 MG tablet Take 70 mg by mouth once a week. Pt takes on Tuesday.   Take with a full glass of water on an empty stomach.    Historical Provider, MD  apixaban (ELIQUIS) 5 MG TABS tablet Take 1 tablet (5 mg total) by mouth 2 (two) times daily. 07/30/15   Henreitta Leber, MD  aspirin EC 81 MG tablet Take 81 mg by mouth daily.    Historical Provider, MD  cetirizine (ZYRTEC) 10 MG tablet Take 10 mg by mouth at bedtime.    Historical Provider, MD  docusate sodium (COLACE) 100 MG capsule Take 1 capsule (100 mg total) by mouth 2 (two) times daily. 11/02/15   Fritzi Mandes, MD  DULoxetine (CYMBALTA) 30 MG capsule Take 90 mg by mouth daily.     Historical Provider, MD  fentaNYL (DURAGESIC - DOSED MCG/HR) 75 MCG/HR Place 1 patch (75 mcg total) onto the skin every 3 (three) days. 11/02/15   Fritzi Mandes, MD  fluticasone (FLOVENT HFA) 220 MCG/ACT inhaler Inhale 1 puff into the lungs 2 (two) times daily.  Historical Provider, MD  folic acid (FOLVITE) 1 MG tablet Take 1 mg by mouth daily.    Historical Provider, MD  gabapentin (NEURONTIN) 800 MG tablet Take 800 mg by mouth 3 (three) times daily.    Historical Provider, MD  HYDROcodone-acetaminophen (NORCO/VICODIN) 5-325 MG tablet Take 1-2 tablets by mouth every 4 (four) hours as needed for moderate pain. 10/25/15   Henreitta Leber, MD  HYDROmorphone (DILAUDID) 2 MG tablet Take 1 tablet (2 mg total) by mouth every 4 (four) hours as needed for severe pain. 11/02/15   Fritzi Mandes, MD  lidocaine-prilocaine (EMLA) cream Apply 1 application topically as needed (prior to accessing port).    Historical Provider, MD  magnesium oxide (MAG-OX) 400 (241.3 Mg) MG tablet Take 400 mg by mouth at bedtime.    Historical Provider, MD  metFORMIN (GLUCOPHAGE-XR) 750 MG 24 hr tablet Take 750 mg by mouth daily with breakfast.    Historical Provider, MD  metoprolol tartrate (LOPRESSOR) 25 MG tablet Take 1 tablet (25 mg total) by mouth 2 (two) times daily. 06/27/15   Bettey Costa, MD  mirtazapine (REMERON) 45 MG tablet Take 45 mg by mouth at bedtime.    Historical Provider, MD  omeprazole (PRILOSEC) 20 MG capsule Take 20 mg by mouth daily before breakfast.     Historical Provider, MD  ondansetron (ZOFRAN) 8 MG tablet Take 8 mg by mouth every 8 (eight) hours as needed for nausea or vomiting.    Historical Provider, MD  polyethylene glycol (MIRALAX / GLYCOLAX) packet Take 17 g by mouth daily. 11/02/15   Fritzi Mandes, MD  prochlorperazine (COMPAZINE) 10 MG tablet TAKE 1 TABLET ('10MG'$ ) BY MOUTH EVERY SIX HOURS AS NEEDED FOR NAUSEA OR VOMITING 09/01/15   Cammie Sickle, MD  promethazine (PHENERGAN) 25 MG suppository Place 1 suppository (25 mg total) rectally every 6 (six) hours as needed for nausea or vomiting. 07/30/15   Henreitta Leber, MD  simvastatin (ZOCOR) 20 MG tablet Take 20 mg by mouth at bedtime.     Historical Provider, MD  traZODone (DESYREL) 150 MG tablet Take 150 mg by mouth at bedtime.    Historical Provider, MD   Allergies  Allergen Reactions  . Ampicillin Hives and Other (See Comments)    Has patient had a PCN reaction causing immediate rash, facial/tongue/throat swelling, SOB or lightheadedness with hypotension: No Has patient had a PCN reaction causing severe rash involving mucus membranes or skin necrosis: No Has patient had a PCN reaction that required hospitalization No Has patient had a PCN reaction occurring within the last 10 years: Yes If all of the above answers are "NO", then may proceed with Cephalosporin use.  Marland Kitchen Penicillins Hives and Other (See Comments)    Has patient had a PCN reaction causing immediate rash, facial/tongue/throat swelling, SOB or lightheadedness with hypotension: No Has patient had a PCN reaction causing severe rash involving mucus membranes or skin necrosis: No Has patient had a PCN reaction that required hospitalization No Has patient had a PCN reaction occurring within the last 10 years: Yes If all of the above answers are "NO", then may proceed with Cephalosporin use.   Review of Systems  Constitutional: Positive for activity change, fatigue and unexpected weight change.  Neurological: Positive for weakness.    Physical Exam  Constitutional: She appears cachectic. She appears ill.  Cardiovascular: Normal rate, regular rhythm and normal heart sounds.   Pulmonary/Chest: She has decreased breath sounds in the right lower field and the left  lower field.  Musculoskeletal:  -generalized BLE  weakness  Neurological: She is alert.  Skin: Skin is warm and dry.    Vital Signs: BP 95/67 (BP  Location: Right Arm)   Pulse 78   Temp 98 F (36.7 C) (Oral)   Resp 20   Ht '5\' 2"'$  (1.575 m)   Wt 66.2 kg (145 lb 15.1 oz)   SpO2 91%   BMI 26.69 kg/m  Pain Assessment: No/denies pain   Pain Score: 0-No pain   SpO2: SpO2: 91 % O2 Device:SpO2: 91 % O2 Flow Rate: .O2 Flow Rate (L/min): 2 L/min  IO: Intake/output summary:  Intake/Output Summary (Last 24 hours) at 11/15/15 1350 Last data filed at 11/15/15 1100  Gross per 24 hour  Intake           1051.4 ml  Output                0 ml  Net           1051.4 ml    LBM: Last BM Date: 11/14/15 Baseline Weight: Weight: 70.3 kg (155 lb) Most recent weight: Weight: 66.2 kg (145 lb 15.1 oz)     Palliative Assessment/Data:   Flowsheet Rows   Flowsheet Row Most Recent Value  Intake Tab  Referral Department  Hospitalist  Unit at Time of Referral  Cardiac/Telemetry Unit  Palliative Care Primary Diagnosis  Cancer  Date Notified  11/14/15  Palliative Care Type  New Palliative care  Reason for referral  Clarify Goals of Care  Date of Admission  11/14/15  # of days IP prior to Palliative referral  0  Clinical Assessment  Psychosocial & Spiritual Assessment  Palliative Care Outcomes     Discussed with Dr Ether Griffins and oncology Time In: 1515 Time Out: 1630 Time Total: 75 min Greater than 50%  of this time was spent counseling and coordinating care related to the above assessment and plan.  Signed by: Wadie Lessen, NP   Please contact Palliative Medicine Team phone at [phone] for questions and concerns.  For individual provider: See Shea Evans

## 2015-11-15 NOTE — Progress Notes (Signed)
amio gtt discontinued.

## 2015-11-15 NOTE — Care Management (Signed)
Open to [facility] home health for SN and PT.

## 2015-11-15 NOTE — Progress Notes (Signed)
Patient resting in bed a'@o'$ . C/o chest pain. Was medicationed at 1713 however one tablet given will given an additional tablet. No respiratory distress noted on ra. Currently no tele monitoring. Will continue to monitor

## 2015-11-15 NOTE — Consult Note (Signed)
[place] provided pastoral care and prayer as requested.

## 2015-11-15 NOTE — Progress Notes (Signed)
Pt's HR has been in the 60-70's most of the night, Pt has converted to NSR, MD Hugelmeyer made aware, orders received to continue to monitor patient and stop Amiodarone if HR is lower than 55. Amio gtt will remain at 16.11m/hr for now, will continue to monitor pt.

## 2015-11-15 NOTE — Progress Notes (Signed)
.[place] CONSULT NOTE  Patient Care Team: Kasandra Knudsen, NP as PCP - General (Nurse Practitioner)  CHIEF COMPLAINTS/PURPOSE OF CONSULTATION: Metastatic lung cancer/chest pain   HISTORY OF PRESENTING ILLNESS:  Charlene Williams 60 y.o.  female with history of left-sided adenocarcinoma the lung metastatic- status post left-sided pleurodesis currently on first an therapy with Keytruda Carbo and Alimta status post 3 cycles is currently admitted to the hospital for worsening chest pain/shortness of breath.   Patient was noted to have nonsustained V. Tach-evaluated by cardiology/currently on amiodarone drip. Also noted to have slight bump in troponin- however cardiac catheter/further cardiology evaluation hold given patient's poor prognosis.  Patient complains of poor appetite. Complains of continued weight loss. Overall feeling poorly. Denies any headaches or double vision.  ROS: A complete 10 point review of system is done which is negative except mentioned above in history of present illness  MEDICAL HISTORY:  Past Medical History:  Diagnosis Date  . Aneurysm ([place])   . Chest pain, pleuritic   . COPD (chronic obstructive pulmonary disease) ([place])    on home oxygen  . Depression   . Diabetes mellitus without complication ([place])   . Family history of chronic pain 01/17/2015  . Hiatal hernia   . Hypercholesteremia   . Hypertension   . Illicit drug use   . Lung cancer ([place])    adenocarcinoma left lung  . Migraines   . Sleep apnea   . Spine disorder     SURGICAL HISTORY: Past Surgical History:  Procedure Laterality Date  . ABDOMINAL HYSTERECTOMY    . EYE SURGERY    . HERNIA REPAIR    . PORTACATH PLACEMENT Left 07/14/2015   Procedure: INSERTION PORT-A-CATH;  Surgeon: Nestor Lewandowsky, MD;  Location: [facility] ORS;  Service: General;  Laterality: Left;  . REMOVAL OF PLEURAL DRAINAGE CATHETER N/A 07/18/2015   Procedure: REMOVAL OF PLEURAL DRAINAGE CATHETER;  Surgeon:  Nestor Lewandowsky, MD;  Location: [facility] ORS;  Service: Thoracic;  Laterality: N/A;  . VIDEO ASSISTED THORACOSCOPY (VATS)/THOROCOTOMY Left 07/14/2015   Procedure: VIDEO ASSISTED THORACOSCOPY (VATS), pleural biopsy;  Surgeon: Nestor Lewandowsky, MD;  Location: [facility] ORS;  Service: General;  Laterality: Left;    SOCIAL HISTORY: Social History   Social History  . Marital status: Divorced    Spouse name: N/A  . Number of children: N/A  . Years of education: N/A   Occupational History  . disabled    Social History Main Topics  . Smoking status: Former Smoker    Packs/day: 1.50    Years: 45.00    Types: Cigarettes    Quit date: 04/29/2015  . Smokeless tobacco: Never Used     Comment: Quit 2 months ago  . Alcohol use No  . Drug use: No  . Sexual activity: Not on file   Other Topics Concern  . Not on file   Social History Narrative  . No narrative on file    FAMILY HISTORY: Family History  Problem Relation Age of Onset  . Heart disease Mother   . Breast cancer Sister     ALLERGIES:  is allergic to ampicillin and penicillins.  MEDICATIONS:  Current Facility-Administered Medications  Medication Dose Route Frequency Provider Last Rate Last Dose  . 0.9 %  sodium chloride infusion   Intravenous Continuous Theodoro Grist, MD 40 mL/hr at 11/15/15 1534    . acetaminophen (TYLENOL) tablet 650 mg  650 mg Oral Q4H PRN Saundra Shelling, MD      .  albuterol (PROVENTIL) (2.5 MG/3ML) 0.083% nebulizer solution 2.5 mg  2.5 mg Nebulization Q6H PRN Theodoro Grist, MD      . amiodarone (PACERONE) tablet 400 mg  400 mg Oral BID Theodoro Grist, MD   400 mg at 11/15/15 1150  . antiseptic oral rinse (CPC / CETYLPYRIDINIUM CHLORIDE 0.05%) solution 7 mL  7 mL Mouth Rinse BID Theodoro Grist, MD   7 mL at 11/15/15 1000  . aspirin EC tablet 81 mg  81 mg Oral Daily Saundra Shelling, MD   81 mg at 11/15/15 0846  . budesonide (PULMICORT) nebulizer solution 0.5 mg  0.5 mg Nebulization BID Theodoro Grist, MD   0.5 mg at 11/15/15  0729  . docusate sodium (COLACE) capsule 100 mg  100 mg Oral BID Saundra Shelling, MD   100 mg at 11/14/15 2253  . DULoxetine (CYMBALTA) DR capsule 90 mg  90 mg Oral Daily Saundra Shelling, MD   90 mg at 11/15/15 0846  . enoxaparin (LOVENOX) injection 65 mg  65 mg Subcutaneous Q12H Theodoro Grist, MD   65 mg at 11/15/15 1149  . fentaNYL (DURAGESIC - dosed mcg/hr) 75 mcg  75 mcg Transdermal Q72H Saundra Shelling, MD   75 mcg at 11/14/15 1230  . folic acid (FOLVITE) tablet 1 mg  1 mg Oral Daily Saundra Shelling, MD   1 mg at 11/15/15 0846  . gabapentin (NEURONTIN) 800 mg  800 mg Oral TID Saundra Shelling, MD   800 mg at 11/15/15 0846  . HYDROcodone-acetaminophen (NORCO/VICODIN) 5-325 MG per tablet 1-2 tablet  1-2 tablet Oral Q4H PRN Saundra Shelling, MD   1 tablet at 11/15/15 1755  . insulin aspart (novoLOG) injection 0-15 Units  0-15 Units Subcutaneous TID WC Theodoro Grist, MD   2 Units at 11/14/15 1300  . insulin aspart (novoLOG) injection 0-5 Units  0-5 Units Subcutaneous QHS Theodoro Grist, MD      . levofloxacin (LEVAQUIN) IVPB 750 mg  750 mg Intravenous Q24H Saundra Shelling, MD   750 mg at 11/15/15 0847  . LORazepam (ATIVAN) tablet 1 mg  1 mg Oral Q6H PRN Knox Royalty, NP      . magnesium oxide (MAG-OX) tablet 400 mg  400 mg Oral QHS Saundra Shelling, MD   400 mg at 11/14/15 2253  . metoprolol tartrate (LOPRESSOR) tablet 25 mg  25 mg Oral BID Saundra Shelling, MD   25 mg at 11/15/15 0846  . mirtazapine (REMERON) tablet 45 mg  45 mg Oral QHS Saundra Shelling, MD   45 mg at 11/14/15 2253  . morphine CONCENTRATE 10 MG/0.5ML oral solution 5 mg  5 mg Oral Q2H PRN Knox Royalty, NP      . nitroGLYCERIN (NITROSTAT) SL tablet 0.4 mg  0.4 mg Sublingual Q5 Min x 3 PRN Saundra Shelling, MD      . ondansetron (ZOFRAN) injection 4 mg  4 mg Intravenous Q6H PRN Saundra Shelling, MD   4 mg at 11/14/15 1632  . pantoprazole (PROTONIX) EC tablet 40 mg  40 mg Oral Daily Saundra Shelling, MD   40 mg at 11/15/15 0846  . polyethylene glycol (MIRALAX /  GLYCOLAX) packet 17 g  17 g Oral Daily Pavan Pyreddy, MD   17 g at 11/14/15 1037  . simvastatin (ZOCOR) tablet 20 mg  20 mg Oral QHS Saundra Shelling, MD   20 mg at 11/14/15 2253  . sucralfate (CARAFATE) 1 GM/10ML suspension 1 g  1 g Oral TID WC & HS Theodoro Grist, MD  1 g at 11/15/15 1150  . traZODone (DESYREL) tablet 150 mg  150 mg Oral QHS Saundra Shelling, MD   150 mg at 11/14/15 2258      .  PHYSICAL EXAMINATION:  Vitals:   11/15/15 1128 11/15/15 1150  BP: 95/67 108/62  Pulse: 78   Resp: 20   Temp: 98 F (36.7 C)    Filed Weights   11/14/15 0226 11/14/15 1030  Weight: 155 lb (70.3 kg) 145 lb 15.1 oz (66.2 kg)    GENERAL: Well-nourished well-developed; Alert, no distress and comfortable.   Accompanied by daughter. EYES: no pallor or icterus OROPHARYNX: no thrush or ulceration. NECK: supple, no masses felt LYMPH:  no palpable lymphadenopathy in the cervical, axillary or inguinal regions LUNGS: decreased breath sounds to auscultation at bases and  No wheeze or crackles HEART/CVS: regular rate & rhythm and no murmurs; No lower extremity edema ABDOMEN: abdomen soft, non-tender and normal bowel sounds Musculoskeletal:no cyanosis of digits and no clubbing  PSYCH: alert & oriented x 3 with fluent speech NEURO: no focal motor/sensory deficits SKIN:  no rashes or significant lesions  LABORATORY DATA:  I have reviewed the data as listed Lab Results  Component Value Date   WBC 13.7 (H) 11/15/2015   HGB 9.1 (L) 11/15/2015   HCT 28.1 (L) 11/15/2015   MCV 82.2 11/15/2015   PLT 219 11/15/2015    Recent Labs  10/04/15 0830  10/24/15 0412 10/31/15 0804 11/01/15 0459 11/14/15 0337 11/15/15 0417  NA 135  < > 133* 136 133* 134* 135  K 4.4  < > 4.0 3.3* 3.4* 3.3* 4.0  CL 103  < > 96* 99* 95* 94* 101  CO2 26  < > '22 27 28 25 26  '$ GLUCOSE 105*  < > 80 116* 91 118* 90  BUN 14  < > '12 11 10 18 11  '$ CREATININE 0.94  < > 0.68 0.54 0.82 0.65 0.56  CALCIUM 9.5  < > 9.8 10.2 10.0 10.6*  9.4  GFRNONAA >60  < > >60 >60 >60 >60 >60  GFRAA >60  < > >60 >60 >60 >60 >60  PROT 7.9  --  8.2* 7.5  --   --   --   ALBUMIN 3.5  --  3.6 3.2*  --   --   --   AST 19  --  21 16  --   --   --   ALT 15  --  16 11*  --   --   --   ALKPHOS 130*  --  125 125  --   --   --   BILITOT 0.3  --  1.0 0.5  --   --   --   < > = values in this interval not displayed.  RADIOGRAPHIC STUDIES: I have personally reviewed the radiological images as listed and agreed with the findings in the report. Dg Chest 1 View  Result Date: 10/24/2015 CLINICAL DATA:  60 year old female status post left thoracentesis. EXAM: CHEST 1 VIEW COMPARISON:  Preoperative chest x-ray 10/24/2015 at 4:39 a.m. FINDINGS: Stable position of left subclavian approach single-lumen power injectable port catheter with the catheter tip overlying the mid SVC. Stable extensive left pleural thickening and loculated left lower lobe pleural effusion. No evidence of pneumothorax. Persistent volume loss in the left hemi thorax. The cardiac and mediastinal contours remain unchanged. Aortic atherosclerosis is similar compared to prior. Slightly low inspiratory volumes with right basilar atelectasis. No acute osseous abnormality. Lytic lesion  in the lateral aspect of the left seventh rib. IMPRESSION: No evidence of pneumothorax status post left thoracentesis. Electronically Signed   By: Jacqulynn Cadet M.D.   On: 10/24/2015 17:11   Dg Chest 2 View  Result Date: 11/14/2015 CLINICAL DATA:  60 y/o F; lung cancer with metastasis to spine. Back pain without relief. EXAM: CHEST  2 VIEW COMPARISON:  Concurrent thoracic spine radiographs. Chest CT dated 10/31/2015. FINDINGS: Elevated left hemidiaphragm and consolidation of the left mid and lower lung zones as well as diffuse left-sided pleural thickening is without significant interval change in comparison with the prior CT of the chest. Cardiac silhouette is stable. Port catheter with tip projecting over the mid  SVC. No consolidation in the right lung. There are several lucencies in the ribs bilaterally corresponding to osseous metastasis on prior CT. New mildly displaced right lateral fourth and sixth rib fractures. Healed right posterior fifth rib fracture. Previously identified pathologic fractures at right 10 and left 3 and 7 are not clearly appreciated. IMPRESSION: 1. Stable elevated left hemidiaphragm, left mid and lower lung zone consolidations, and left-sided pleural thickening. 2. Mildly displaced right lateral T4 and T6 rib fractures are new. Additional pathologic fractures identified on prior CT may are not clearly appreciated. Electronically Signed   By: Kristine Garbe M.D.   On: 11/14/2015 04:26   Dg Chest 2 View  Result Date: 10/31/2015 CLINICAL DATA:  Shortness of breath and left-sided pain EXAM: CHEST  2 VIEW COMPARISON:  10/24/2015 FINDINGS: Complex left pleural effusion with lower lobe mass and diffuse pleural thickening. No pneumothorax or interval collapse. Clear right lung. Normal heart size and stable mediastinal contours. Porta catheter on the left with tip at the SVC level. IMPRESSION: No acute finding. Left pleural thickening and loculated fluid is stable compared 10/24/2015. Electronically Signed   By: Monte Fantasia M.D.   On: 10/31/2015 07:03   Dg Chest 2 View  Result Date: 10/24/2015 CLINICAL DATA:  Severe LEFT lower back pain beginning last week, with vomiting and diaphoresis. History of lung cancer, on chemotherapy. EXAM: CHEST  2 VIEW COMPARISON:  Chest radiograph Aug 12, 2015 FINDINGS: Large LEFT pleural effusion, increased from prior examination with underlying consolidation/mass. RIGHT lung is clear. The cardiac silhouette is mildly enlarged. Calcified aortic knob. Single lumen LEFT chest port with distal tip projecting in proximal superior vena cava. No pneumothorax. Soft tissue planes included osseous structures are nonsuspicious. IMPRESSION: Large LEFT pleural effusion  with underlying consolidation/mass. Electronically Signed   By: Elon Alas M.D.   On: 10/24/2015 05:05   Dg Thoracic Spine 2 View  Result Date: 11/14/2015 CLINICAL DATA:  60 y/o F; lung cancer with metastasis to the spine. Back pain without relief. EXAM: THORACIC SPINE 2 VIEWS COMPARISON:  Chest CT dated 10/31/2015. Concurrent radiographs of the chest. FINDINGS: Vertebral body heights are preserved. No acute fracture is identified. Multiple osseous lesions are better characterized on the prior CT of the chest. Port catheter with tip projecting over the mid SVC. Moderate degenerative changes of the mid thoracic spine. IMPRESSION: No acute fracture or dislocation is identified. Multiple osseous metastasis are better characterized on the prior CT of the chest. Electronically Signed   By: Kristine Garbe M.D.   On: 11/14/2015 04:18   Dg Abd 1 View  Result Date: 11/14/2015 CLINICAL DATA:  Left upper quadrant pain onset today. EXAM: ABDOMEN - 1 VIEW COMPARISON:  None. FINDINGS: Nonobstructive bowel gas pattern. Oral contrast material noted within the renal collecting systems  and bladder. Bilateral tubal ligation clips noted. No free air organomegaly. IMPRESSION: No acute findings. Electronically Signed   By: Rolm Baptise M.D.   On: 11/14/2015 11:46   Ct Head Wo Contrast  Result Date: 11/14/2015 CLINICAL DATA:  60 y/o F; history of lung cancer with metastasis to the spine. Patient is presenting with confusion. EXAM: CT HEAD WITHOUT CONTRAST TECHNIQUE: Contiguous axial images were obtained from the base of the skull through the vertex without intravenous contrast. COMPARISON:  MRI of the brain dated 11/01/2015 and CT of the head dated 07/29/2015. FINDINGS: Brain: No evidence of acute infarction, hemorrhage, hydrocephalus, extra-axial collection or mass lesion/mass effect. 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 Vascular: No hyperdense vessel. Mild internal carotid artery  calcifications. Skull: There are multiple lucent lesions to the skull the largest of which are seen in the right parietal bone and within the clivus consistent with osseous metastatic disease. No gross interval change in comparison with prior MRI given differences in technique. Sinuses/Orbits: No acute finding. Other: None. IMPRESSION: 1. No acute intracranial abnormality. Stable small chronic right parietal infarct. 2. 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 Electronically Signed   By: Kristine Garbe M.D.   On: 11/14/2015 05:24   Ct Angio Chest Pe W Or Wo Contrast  Result Date: 11/14/2015 CLINICAL DATA:  Chest pain EXAM: CT ANGIOGRAPHY CHEST WITH CONTRAST TECHNIQUE: Multidetector CT imaging of the chest was performed using the standard protocol during bolus administration of intravenous contrast. Multiplanar CT image reconstructions and MIPs were obtained to evaluate the vascular anatomy. CONTRAST:  75 cc Isovue 300 COMPARISON:  10/31/2015 FINDINGS: Motion artifact degrades the study. There are no obvious filling defects in the pulmonary arterial tree to suggest acute pulmonary thromboembolism. Pleural and parenchymal disease throughout the left hemi thorax is stable compatible with advanced malignancy. Left subclavian Port-A-Cath with its tip in the SVC is stable Patchy indeterminate pulmonary opacities have developed throughout the right mid and lower lung. Bony metastatic disease is stable. No pneumothorax. Atherosclerotic changes throughout the mediastinum are stable. Review of the MIP images confirms the above findings. IMPRESSION: No evidence of acute pulmonary thromboembolism. Exam is limited by motion artifact Patchy pulmonary opacities of developed in the right lung worrisome for an inflammatory process. Also consider atypical edema. Stable advance malignancy within the left hemi thorax and metastatic disease. Electronically Signed   By: Marybelle Killings M.D.   On:  11/14/2015 07:59   Ct Angio Chest Pe W/cm &/or Wo Cm  Result Date: 10/31/2015 CLINICAL DATA:  60 year old female with known carcinoma, metastatic. History of lung carcinoma New complaints of back pain in chills generalized body aches and fevers. EXAM: CT CHEST, ABDOMEN, AND PELVIS WITH CONTRAST TECHNIQUE: Multidetector CT imaging of the chest, abdomen and pelvis was performed following the standard protocol during bolus administration of intravenous contrast. CONTRAST:  85 cc Isovue 370 COMPARISON:  CT 10/05/2015, 07/29/2015 FINDINGS: CT CHEST FINDINGS Port catheter on the left chest wall via subclavian vein. No axillary or supraclavicular adenopathy. Unremarkable appearance of the thoracic inlet, including the visualized thyroid. Several mediastinal lymph nodes present, similar to comparison CT. None of these are enlarged by CT size criteria. Small hiatal hernia. Calcifications of the ascending and descending thoracic aorta. No dissection. No periaortic fluid. No aneurysm. Calcifications of the branch vessels. No central, lobar, segmental, or proximal subsegmental filling defects to indicate pulmonary emboli. Heart size unchanged, within normal limits. Pericardial fluid along the left lateral pericardium associated  with the left-sided pleural disease extending into the mediastinum. Soft tissue extends from the left-sided pleural disease to the posterior aspect of the left lateral ventricle, with poor visualization of the pericardium. Calcifications of native coronary vessels including left main, left anterior descending, circumflex, right coronary arteries. Similar appearance of circumferential soft tissue/fluid of the left pleura, with complete involvement of the parietal pleura. Left upper lobe relatively well aerated, with increasing atelectasis/scarring of the lingula. Complete collapse of the left lower lobe again with the greatest diameter of mass measuring 4.3 cm x 4.9 cm, similar to the comparison CT.  Moderate volume of pleural fluid at the left base. Loss of volume on the left contributes to slight right to left shift of the mediastinal structures. No right-sided confluent airspace disease. No pleural effusion. Nodule within the superior segment of the right lower lobe measures 8 -9 mm, unchanged from the comparison CT. Small nodule within the posterior aspect of the right upper lobe (image 35). Small nodule within the fissure on image 33. Nodules are new from the CT dated 07/29/2015. Musculoskeletal: Lucent lesions involving the left third rib, fifth rib, seventh rib, eighth rib, ninth rib, tenth rib. Lucent lesions involving the right fourth rib, fifth rib, sixth rib, ninth rib, tenth rib. Pathologic fracture at the posterior tenth rib. Progressing lucent lesions of the manubrium, sternum, as well as the thoracic vertebral bodies of T2, T3, T4, T5, T7, T8, T9, T10, T12. CT ABDOMEN PELVIS FINDINGS Abdomen/pelvis: Unremarkable appearance of liver and spleen. Unremarkable appearance of bilateral adrenal glands. No peripancreatic or pericholecystic fluid or inflammatory changes. No radio-opaque gallstones. No intrahepatic or extrahepatic biliary ductal dilatation. No intra-peritoneal free air or significant free-fluid. 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 Appendix is not visualized, however, no inflammatory changes are present adjacent to the cecum to indicate an appendicitis. Circumferential thickening of the soft tissue involving the antrum of the stomach in the pylorus. No inflammatory changes of the mesenteric fat. No transition point or dilated bowel. Right Kidney/Ureter: No hydronephrosis. No nephrolithiasis. No perinephric stranding. Unremarkable course of the right ureter. Left Kidney/Ureter: No hydronephrosis. No nephrolithiasis. No perinephric stranding. Unremarkable course of the left ureter. Cystic lesion at the inferior left kidney again noted. Unremarkable appearance of the  urinary bladder. Hysterectomy. Calcifications of the abdominal aorta and iliac vasculature. Musculoskeletal: Interval progression of disease of the lumbar spine with new metastatic lucent lesions involving L1, L2, L3, L4, and L5. New lesions of the bilateral sacrum, sacral base, and bilateral pelvic bones. New lesions involving the proximal femurs. No significant degenerative changes of the spine. IMPRESSION: Significant progression of skeletal metastatic disease, with new and enlarging lesions throughout the majority of the thoracic and lumbar spine, bilateral ribs, sternum, sacrum, bilateral pelvis, left humerus, left scapula, and bilateral femurs. Pathologic fracture of the posterior right tenth rib, as well as the left third rib and left seventh rib. Similar appearance of left thoracic metastatic disease with circumferential soft tissue/fluid of the parietal and visceral pleura, complete volume loss of the left lower lobe, small pleural effusion, and invasion of the mediastinal structures. Small nodules of the right upper lobe and left lower lobe, concerning for progression of pulmonary Mets in the right chest. Circumferential soft tissue thickening in the distal stomach at the pylorus. This may represent gastritis, and correlation with symptoms and potentially upper endoscopy may be considered. Signed, Dulcy Fanny. Earleen Newport, DO [facility] Electronically Signed   By: Corrie Mckusick D.O.  On: 10/31/2015 12:34   Mr Jeri Cos TT Contrast  Result Date: 11/01/2015 CLINICAL DATA:  Metastatic lung cancer. EXAM: MRI HEAD WITHOUT AND WITH CONTRAST TECHNIQUE: Multiplanar, multiecho pulse sequences of the brain and surrounding structures were obtained without and with intravenous contrast. CONTRAST:  37m MULTIHANCE GADOBENATE DIMEGLUMINE 529 MG/ML IV SOLN COMPARISON:  08/14/2015 FINDINGS: Interval development of multiple enhancing bone lesions compatible with  metastatic disease. There is a 15 mm lesion in the right clivus which was not present previously. This may be eroding the floor of the sella. No significant invasion of the cavernous sinus. 15 mm enhancing lesion in the right parietal bone is new. Smaller enhancing lesions in the parietal bone bilaterally all compatible with metastatic disease. Postcontrast imaging demonstrates mild motion degrading image quality. Allowing for this, no metastatic disease is present in the brain. No enhancing mass lesion in the brain. Leptomeningeal enhancement normal. Ventricle size normal. Cerebral volume normal. Negative for acute infarct. Mild chronic microvascular ischemic change in the white matter similar to the prior study. Mild mucosal edema in the paranasal sinuses. No orbital mass lesion. IMPRESSION: Interval development of metastatic disease to the skull and skullbase compared with the MRI of 08/14/2015. Findings indicate rapid tumor growth. Negative for metastatic disease to the brain. Electronically Signed   By: CFranchot GalloM.D.   On: 11/01/2015 11:26   Ct Abdomen Pelvis W Contrast  Result Date: 10/31/2015 CLINICAL DATA:  60year old female with known carcinoma, metastatic. History of lung carcinoma New complaints of back pain in chills generalized body aches and fevers. EXAM: CT CHEST, ABDOMEN, AND PELVIS WITH CONTRAST TECHNIQUE: Multidetector CT imaging of the chest, abdomen and pelvis was performed following the standard protocol during bolus administration of intravenous contrast. CONTRAST:  85 cc Isovue 370 COMPARISON:  CT 10/05/2015, 07/29/2015 FINDINGS: CT CHEST FINDINGS Port catheter on the left chest wall via subclavian vein. No axillary or supraclavicular adenopathy. Unremarkable appearance of the thoracic inlet, including the visualized thyroid. Several mediastinal lymph nodes present, similar to comparison CT. None of these are enlarged by CT size criteria. Small hiatal hernia. Calcifications of the  ascending and descending thoracic aorta. No dissection. No periaortic fluid. No aneurysm. Calcifications of the branch vessels. No central, lobar, segmental, or proximal subsegmental filling defects to indicate pulmonary emboli. Heart size unchanged, within normal limits. Pericardial fluid along the left lateral pericardium associated with the left-sided pleural disease extending into the mediastinum. Soft tissue extends from the left-sided pleural disease to the posterior aspect of the left lateral ventricle, with poor visualization of the pericardium. Calcifications of native coronary vessels including left main, left anterior descending, circumflex, right coronary arteries. Similar appearance of circumferential soft tissue/fluid of the left pleura, with complete involvement of the parietal pleura. Left upper lobe relatively well aerated, with increasing atelectasis/scarring of the lingula. Complete collapse of the left lower lobe again with the greatest diameter of mass measuring 4.3 cm x 4.9 cm, similar to the comparison CT. Moderate volume of pleural fluid at the left base. Loss of volume on the left contributes to slight right to left shift of the mediastinal structures. No right-sided confluent airspace disease. No pleural effusion. Nodule within the superior segment of the right lower lobe measures 8 -9 mm, unchanged from the comparison CT. Small nodule within the posterior aspect of the right upper lobe (image 35). Small nodule within the fissure on image 33. Nodules are new from the CT dated 07/29/2015. Musculoskeletal: Lucent lesions involving the left third rib, fifth  rib, seventh rib, eighth rib, ninth rib, tenth rib. Lucent lesions involving the right fourth rib, fifth rib, sixth rib, ninth rib, tenth rib. Pathologic fracture at the posterior tenth rib. Progressing lucent lesions of the manubrium, sternum, as well as the thoracic vertebral bodies of T2, T3, T4, T5, T7, T8, T9, T10, T12. CT ABDOMEN  PELVIS FINDINGS Abdomen/pelvis: Unremarkable appearance of liver and spleen. Unremarkable appearance of bilateral adrenal glands. No peripancreatic or pericholecystic fluid or inflammatory changes. No radio-opaque gallstones. No intrahepatic or extrahepatic biliary ductal dilatation. No intra-peritoneal free air or significant free-fluid. 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 Appendix is not visualized, however, no inflammatory changes are present adjacent to the cecum to indicate an appendicitis. Circumferential thickening of the soft tissue involving the antrum of the stomach in the pylorus. No inflammatory changes of the mesenteric fat. No transition point or dilated bowel. Right Kidney/Ureter: No hydronephrosis. No nephrolithiasis. No perinephric stranding. Unremarkable course of the right ureter. Left Kidney/Ureter: No hydronephrosis. No nephrolithiasis. No perinephric stranding. Unremarkable course of the left ureter. Cystic lesion at the inferior left kidney again noted. Unremarkable appearance of the urinary bladder. Hysterectomy. Calcifications of the abdominal aorta and iliac vasculature. Musculoskeletal: Interval progression of disease of the lumbar spine with new metastatic lucent lesions involving L1, L2, L3, L4, and L5. New lesions of the bilateral sacrum, sacral base, and bilateral pelvic bones. New lesions involving the proximal femurs. No significant degenerative changes of the spine. IMPRESSION: Significant progression of skeletal metastatic disease, with new and enlarging lesions throughout the majority of the thoracic and lumbar spine, bilateral ribs, sternum, sacrum, bilateral pelvis, left humerus, left scapula, and bilateral femurs. Pathologic fracture of the posterior right tenth rib, as well as the left third rib and left seventh rib. Similar appearance of left thoracic metastatic disease with circumferential soft tissue/fluid of the parietal and visceral pleura,  complete volume loss of the left lower lobe, small pleural effusion, and invasion of the mediastinal structures. Small nodules of the right upper lobe and left lower lobe, concerning for progression of pulmonary Mets in the right chest. Circumferential soft tissue thickening in the distal stomach at the pylorus. This may represent gastritis, and correlation with symptoms and potentially upper endoscopy may be considered. Signed, Dulcy Fanny. Earleen Newport, DO [facility] Electronically Signed   By: Corrie Mckusick D.O.   On: 10/31/2015 12:34   Dg Abd Acute W/chest  Result Date: 10/31/2015 CLINICAL DATA:  Generalized body aches, sick to her stomach, mid back pain, history lung cancer, COPD, diabetes mellitus, hypertension, smoking EXAM: DG ABDOMEN ACUTE W/ 1V CHEST COMPARISON:  Earlier chest radiograph of 10/31/2015, abdominal radiograph 07/24/2012 FINDINGS: LEFT subclavian Port-A-Cath with tip projecting over SVC. Enlargement of cardiac silhouette with slight vascular congestion. Atherosclerotic calcification aorta. Persistent LEFT pleural effusion and LEFT lung atelectasis greatest at bases. Minimal atelectasis RIGHT lung base with RIGHT lung otherwise clear. No pneumothorax. Bones demineralized. Nonobstructive bowel gas pattern. Air-filled normal upper normal caliber loops of small bowel in the LEFT mid abdomen. No bowel dilatation, bowel wall thickening, or free air. Surgical clips in pelvis bilaterally. No urinary tract calcification definitely visualized. IMPRESSION: Nonobstructive bowel gas pattern. Persistent LEFT pleural effusion and LEFT lung atelectasis greatest at LEFT base. Electronically Signed   By: Lavonia Dana M.D.   On: 10/31/2015 08:11   US Thoracentesis Asp Pleural Space W/img Guide  Result Date: 10/24/2015 INDICATION: 60 year old female with history of left lung cancer and persistent loculated left pleural  effusion. EXAM: ULTRASOUND GUIDED LEFT  THORACENTESIS MEDICATIONS: None. COMPLICATIONS: None immediate. PROCEDURE: An ultrasound guided thoracentesis was thoroughly discussed with the patient and questions answered. The benefits, risks, alternatives and complications were also discussed. The patient understands and wishes to proceed with the procedure. Written consent was obtained. Ultrasound was performed to localize and mark an adequate pocket of fluid in the left chest. The area was then prepped and draped in the normal sterile fashion. 1% Lidocaine was used for local anesthesia. Under ultrasound guidance a 19 gauge, 7-cm, Yueh catheter was introduced. Thoracentesis was performed. The catheter was removed and a dressing applied. FINDINGS: A total of approximately 70 mL of reddish brown pleural fluid was removed. Samples were sent to the laboratory as requested by the clinical team. IMPRESSION: Successful ultrasound guided left thoracentesis yielding 70 mL of pleural fluid. Of note, the left basilar pleural effusion is loculated and highly complex. Further aspiration is unlikely to yield significantly more fluid. Electronically Signed   By: Jacqulynn Cadet M.D.   On: 10/24/2015 17:12    ASSESSMENT & PLAN:   # Metastatic adenocarcinoma the lung currently status post 3 cycles of carboplatin and Alimta Keytruda- currently for increasing pain/nonsustained V. Tach.  # Metastatic adenocarcinoma the lung- post-3 cycles-with significant progression of disease noted on imaging with multiple lytic lesions in vertebral bodies/pelvis. Given the significant decline in performance status; and progression of disease I would not think patient is a candidate for any further chemotherapy or immunotherapy. This was discussed multiple times with the family/and with the patient. Patient would be hospice candidate.  # CODE STATUS- recommend DNR/DNI.   # Also discussed the patient family [father daughter]- at length given the overall poor prognosis would  recommend discharge hospice at home. Patient initially reluctant- however was open to the idea of keeping her comfortable at home.   # Also spoke to [facility] care nurse- regarding the above plan.   Thank you Dr.Vaickute  for allowing me to participate in the care of your pleasant patient. Please do not hesitate to contact me with questions or concerns in the interim.    Cammie Sickle, MD 11/15/2015 6:03 PM

## 2015-11-16 ENCOUNTER — Ambulatory Visit: Payer: Medicare Other

## 2015-11-16 DIAGNOSIS — G893 Neoplasm related pain (acute) (chronic): Secondary | ICD-10-CM

## 2015-11-16 DIAGNOSIS — K5900 Constipation, unspecified: Secondary | ICD-10-CM

## 2015-11-16 DIAGNOSIS — Z794 Long term (current) use of insulin: Secondary | ICD-10-CM

## 2015-11-16 DIAGNOSIS — Z7982 Long term (current) use of aspirin: Secondary | ICD-10-CM

## 2015-11-16 DIAGNOSIS — C7951 Secondary malignant neoplasm of bone: Principal | ICD-10-CM

## 2015-11-16 DIAGNOSIS — Z79899 Other long term (current) drug therapy: Secondary | ICD-10-CM

## 2015-11-16 DIAGNOSIS — Z515 Encounter for palliative care: Secondary | ICD-10-CM

## 2015-11-16 DIAGNOSIS — Z66 Do not resuscitate: Secondary | ICD-10-CM

## 2015-11-16 DIAGNOSIS — C3490 Malignant neoplasm of unspecified part of unspecified bronchus or lung: Secondary | ICD-10-CM

## 2015-11-16 DIAGNOSIS — Z7984 Long term (current) use of oral hypoglycemic drugs: Secondary | ICD-10-CM

## 2015-11-16 LAB — GLUCOSE, CAPILLARY
GLUCOSE-CAPILLARY: 91 mg/dL (ref 65–99)
GLUCOSE-CAPILLARY: 81 mg/dL (ref 65–99)

## 2015-11-16 MED ORDER — AMIODARONE HCL 400 MG PO TABS: 400.0000 mg | ORAL_TABLET | Freq: Every day | ORAL | 0 refills | Status: AC

## 2015-11-16 MED ORDER — LEVOFLOXACIN 750 MG PO TABS: 750.0000 mg | ORAL_TABLET | Freq: Every day | ORAL | 0 refills | Status: AC

## 2015-11-16 MED ORDER — LEVOFLOXACIN 500 MG PO TABS: 750.0000 mg | ORAL_TABLET | ORAL | Status: DC

## 2015-11-16 NOTE — Progress Notes (Signed)
Pt discharged to home via wc with 3LO2 per [place].  Instructions and rx(amiodarone, Levaquin) given to pt and daughter.  Questions answered.  No distress.

## 2015-11-16 NOTE — Progress Notes (Signed)
SUBJECTIVE: Charlene Williams is resting comfortably in bed. She admits to some chest discomfort with movement. No shortness of breath.   Vitals:   11/15/15 1950 11/15/15 1952 11/15/15 2007 11/16/15 0400  BP: (!) 81/48 (!) 100/57  116/64  Pulse: 80 78  76  Resp: 20   20  Temp: 98.4 F (36.9 C)   98.6 F (37 C)  TempSrc: Oral   Oral  SpO2: 96% 91% 91% 90%  Weight:      Height:        Intake/Output Summary (Last 24 hours) at 11/16/15 0812 Last data filed at 11/16/15 0740  Gross per 24 hour  Intake              240 ml  Output              400 ml  Net             -160 ml    LABS: Basic Metabolic Panel:  Recent Labs  11/14/15 0337 11/14/15 1002 11/15/15 0417  NA 134*  --  135  K 3.3*  --  4.0  CL 94*  --  101  CO2 25  --  26  GLUCOSE 118*  --  90  BUN 18  --  11  CREATININE 0.65  --  0.56  CALCIUM 10.6*  --  9.4  MG  --  2.1  --    Liver Function Tests: No results for input(s): AST, ALT, ALKPHOS, BILITOT, PROT, ALBUMIN in the last 72 hours. No results for input(s): LIPASE, AMYLASE in the last 72 hours. CBC:  Recent Labs  11/14/15 0337 11/15/15 0417  WBC 20.1* 13.7*  HGB 10.7* 9.1*  HCT 32.2* 28.1*  MCV 80.9 82.2  PLT 242 219   Cardiac Enzymes:  Recent Labs  11/14/15 1002 11/14/15 1554 11/14/15 2139  TROPONINI 0.09* 0.09* 0.07*   BNP: Invalid input(s): POCBNP D-Dimer: No results for input(s): DDIMER in the last 72 hours. Hemoglobin A1C: No results for input(s): HGBA1C in the last 72 hours. Fasting Lipid Panel:  Recent Labs  11/15/15 0417  CHOL 71  HDL 27*  LDLCALC 24  TRIG 102  CHOLHDL 2.6   Thyroid Function Tests: No results for input(s): TSH, T4TOTAL, T3FREE, THYROIDAB in the last 72 hours.  Invalid input(s): FREET3 Anemia Panel: No results for input(s): VITAMINB12, FOLATE, FERRITIN, TIBC, IRON, RETICCTPCT in the last 72 hours.   PHYSICAL EXAM General: Well developed, well nourished, in no acute distress HEENT:  Normocephalic and  atramatic Neck:  No JVD.  Lungs: Clear bilaterally to auscultation and percussion. Heart: HRRR . Normal S1 and S2 without gallops or murmurs.  Abdomen: Bowel sounds are positive, abdomen soft and non-tender  Msk:  Back normal, normal gait. Normal strength and tone for age. Extremities: No clubbing, cyanosis or edema.   Neuro: Alert and oriented X 3. Psych:  Good affect, responds appropriately  TELEMETRY: Non-tele  ASSESSMENT AND PLAN:  Atypical chest pain with mildly elevated troponins in the setting of the patient with terminal metastatic lung cancer involving multi organs and bones. Echocardiogram was done which showed normal LV systolic function and normal wall motion. She had a brief episode of nonsustained V. tach and also some bursts of atrial fibrillation which may contributed to the elevated troponins. She is now on amiodarone 400 mg daily and heart rate is regular. The patient is a poor candidate for invasive procedures at this time and conservative management has been decided. Comfort care is  indicated.  There is plan to go home today with hospice.   Principal Problem:   Non-STEMI (non-ST elevated myocardial infarction) [facility]) Active Problems:   Community acquired pneumonia   DNR (do not resuscitate)   Palliative care by specialist   Metastatic lung cancer (metastasis from lung to other site) [facility])    Daune Perch, NP 11/16/2015 8:12 AM

## 2015-11-16 NOTE — Progress Notes (Signed)
.[facility] CONSULT NOTE  Patient Care Team: Kasandra Knudsen, NP as PCP - General (Nurse Practitioner)  CHIEF COMPLAINTS/PURPOSE OF CONSULTATION: Metastatic lung cancer/chest pain  CC; continues to complain of pain in her chest and the back. However currently improved. Poor appetite. No nausea. Positive for constipation.   MEDICAL HISTORY:  Past Medical History:  Diagnosis Date  . Aneurysm ([place])   . Chest pain, pleuritic   . COPD (chronic obstructive pulmonary disease) ([place])    on home oxygen  . Depression   . Diabetes mellitus without complication ([place])   . Family history of chronic pain 01/17/2015  . Hiatal hernia   . Hypercholesteremia   . Hypertension   . Illicit drug use   . Lung cancer ([place])    adenocarcinoma left lung  . Migraines   . Sleep apnea   . Spine disorder     SURGICAL HISTORY: Past Surgical History:  Procedure Laterality Date  . ABDOMINAL HYSTERECTOMY    . EYE SURGERY    . HERNIA REPAIR    . PORTACATH PLACEMENT Left 07/14/2015   Procedure: INSERTION PORT-A-CATH;  Surgeon: Nestor Lewandowsky, MD;  Location: [facility] ORS;  Service: General;  Laterality: Left;  . REMOVAL OF PLEURAL DRAINAGE CATHETER N/A 07/18/2015   Procedure: REMOVAL OF PLEURAL DRAINAGE CATHETER;  Surgeon: Nestor Lewandowsky, MD;  Location: [facility] ORS;  Service: Thoracic;  Laterality: N/A;  . VIDEO ASSISTED THORACOSCOPY (VATS)/THOROCOTOMY Left 07/14/2015   Procedure: VIDEO ASSISTED THORACOSCOPY (VATS), pleural biopsy;  Surgeon: Nestor Lewandowsky, MD;  Location: [facility] ORS;  Service: General;  Laterality: Left;    SOCIAL HISTORY: Social History   Social History  . Marital status: Divorced    Spouse name: N/A  . Number of children: N/A  . Years of education: N/A   Occupational History  . disabled    Social History Main Topics  . Smoking status: Former Smoker    Packs/day: 1.50    Years: 45.00    Types: Cigarettes    Quit date: 04/29/2015  . Smokeless tobacco: Never Used     Comment:  Quit 2 months ago  . Alcohol use No  . Drug use: No  . Sexual activity: Not on file   Other Topics Concern  . Not on file   Social History Narrative  . No narrative on file    FAMILY HISTORY: Family History  Problem Relation Age of Onset  . Heart disease Mother   . Breast cancer Sister     ALLERGIES:  is allergic to ampicillin and penicillins.  MEDICATIONS:  Current Facility-Administered Medications  Medication Dose Route Frequency Provider Last Rate Last Dose  . 0.9 %  sodium chloride infusion   Intravenous Continuous Theodoro Grist, MD 40 mL/hr at 11/15/15 1534    . acetaminophen (TYLENOL) tablet 650 mg  650 mg Oral Q4H PRN Saundra Shelling, MD   650 mg at 11/16/15 0820  . albuterol (PROVENTIL) (2.5 MG/3ML) 0.083% nebulizer solution 2.5 mg  2.5 mg Nebulization Q6H PRN Theodoro Grist, MD      . amiodarone (PACERONE) tablet 400 mg  400 mg Oral BID Theodoro Grist, MD   400 mg at 11/16/15 0819  . antiseptic oral rinse (CPC / CETYLPYRIDINIUM CHLORIDE 0.05%) solution 7 mL  7 mL Mouth Rinse BID Theodoro Grist, MD   7 mL at 11/16/15 1000  . aspirin EC tablet 81 mg  81 mg Oral Daily Saundra Shelling, MD   81 mg at 11/16/15 0820  . budesonide (PULMICORT) nebulizer  solution 0.5 mg  0.5 mg Nebulization BID Theodoro Grist, MD   0.5 mg at 11/16/15 0727  . docusate sodium (COLACE) capsule 100 mg  100 mg Oral BID Saundra Shelling, MD   100 mg at 11/15/15 2120  . DULoxetine (CYMBALTA) DR capsule 90 mg  90 mg Oral Daily Saundra Shelling, MD   90 mg at 11/16/15 0820  . enoxaparin (LOVENOX) injection 65 mg  65 mg Subcutaneous Q12H Theodoro Grist, MD   65 mg at 11/15/15 1149  . fentaNYL (DURAGESIC - dosed mcg/hr) 75 mcg  75 mcg Transdermal Q72H Saundra Shelling, MD   75 mcg at 11/14/15 1230  . folic acid (FOLVITE) tablet 1 mg  1 mg Oral Daily Saundra Shelling, MD   1 mg at 11/16/15 0820  . gabapentin (NEURONTIN) 800 mg  800 mg Oral TID Saundra Shelling, MD   800 mg at 11/16/15 0819  . HYDROcodone-acetaminophen  (NORCO/VICODIN) 5-325 MG per tablet 1-2 tablet  1-2 tablet Oral Q4H PRN Saundra Shelling, MD   1 tablet at 11/16/15 1201  . insulin aspart (novoLOG) injection 0-15 Units  0-15 Units Subcutaneous TID WC Theodoro Grist, MD   2 Units at 11/14/15 1300  . insulin aspart (novoLOG) injection 0-5 Units  0-5 Units Subcutaneous QHS Theodoro Grist, MD      . Derrill Memo ON 11/17/2015] levofloxacin (LEVAQUIN) tablet 750 mg  750 mg Oral Q24H Vaughan Basta, MD      . LORazepam (ATIVAN) tablet 1 mg  1 mg Oral Q6H PRN Knox Royalty, NP      . magnesium oxide (MAG-OX) tablet 400 mg  400 mg Oral QHS Saundra Shelling, MD   400 mg at 11/15/15 2120  . metoprolol tartrate (LOPRESSOR) tablet 25 mg  25 mg Oral BID Saundra Shelling, MD   25 mg at 11/16/15 0820  . mirtazapine (REMERON) tablet 45 mg  45 mg Oral QHS Saundra Shelling, MD   45 mg at 11/15/15 2120  . morphine CONCENTRATE 10 MG/0.5ML oral solution 5 mg  5 mg Oral Q2H PRN Knox Royalty, NP      . nitroGLYCERIN (NITROSTAT) SL tablet 0.4 mg  0.4 mg Sublingual Q5 Min x 3 PRN Saundra Shelling, MD      . ondansetron (ZOFRAN) injection 4 mg  4 mg Intravenous Q6H PRN Saundra Shelling, MD   4 mg at 11/14/15 1632  . pantoprazole (PROTONIX) EC tablet 40 mg  40 mg Oral Daily Saundra Shelling, MD   40 mg at 11/15/15 0846  . polyethylene glycol (MIRALAX / GLYCOLAX) packet 17 g  17 g Oral Daily Pavan Pyreddy, MD   17 g at 11/14/15 1037  . simvastatin (ZOCOR) tablet 20 mg  20 mg Oral QHS Saundra Shelling, MD   20 mg at 11/15/15 2120  . sucralfate (CARAFATE) 1 GM/10ML suspension 1 g  1 g Oral TID WC & HS Theodoro Grist, MD   1 g at 11/16/15 1307  . traZODone (DESYREL) tablet 150 mg  150 mg Oral QHS Saundra Shelling, MD   150 mg at 11/15/15 2120   Current Outpatient Prescriptions  Medication Sig Dispense Refill  . albuterol (PROVENTIL HFA;VENTOLIN HFA) 108 (90 Base) MCG/ACT inhaler Inhale 2 puffs into the lungs every 6 (six) hours as needed for wheezing or shortness of breath. 1 Inhaler 2  . alendronate  (FOSAMAX) 70 MG tablet Take 70 mg by mouth once a week. Pt takes on Tuesday.   Take with a full glass of water on an empty  stomach.    Marland Kitchen amiodarone (PACERONE) 400 MG tablet Take 1 tablet (400 mg total) by mouth daily. 30 tablet 0  . apixaban (ELIQUIS) 5 MG TABS tablet Take 1 tablet (5 mg total) by mouth 2 (two) times daily. 60 tablet 1  . aspirin EC 81 MG tablet Take 81 mg by mouth daily.    . cetirizine (ZYRTEC) 10 MG tablet Take 10 mg by mouth at bedtime.    . docusate sodium (COLACE) 100 MG capsule Take 1 capsule (100 mg total) by mouth 2 (two) times daily. 10 capsule 0  . DULoxetine (CYMBALTA) 30 MG capsule Take 90 mg by mouth daily.     . fentaNYL (DURAGESIC - DOSED MCG/HR) 75 MCG/HR Place 1 patch (75 mcg total) onto the skin every 3 (three) days. 5 patch 0  . fluticasone (FLOVENT HFA) 220 MCG/ACT inhaler Inhale 1 puff into the lungs 2 (two) times daily.    . folic acid (FOLVITE) 1 MG tablet Take 1 mg by mouth daily.    Marland Kitchen gabapentin (NEURONTIN) 800 MG tablet Take 800 mg by mouth 3 (three) times daily.    Marland Kitchen HYDROcodone-acetaminophen (NORCO/VICODIN) 5-325 MG tablet Take 1-2 tablets by mouth every 4 (four) hours as needed for moderate pain. 30 tablet 0  . HYDROmorphone (DILAUDID) 2 MG tablet Take 1 tablet (2 mg total) by mouth every 4 (four) hours as needed for severe pain. 30 tablet 0  . levofloxacin (LEVAQUIN) 750 MG tablet Take 1 tablet (750 mg total) by mouth daily. 3 tablet 0  . lidocaine-prilocaine (EMLA) cream Apply 1 application topically as needed (prior to accessing port).    . magnesium oxide (MAG-OX) 400 (241.3 Mg) MG tablet Take 400 mg by mouth at bedtime.    . metFORMIN (GLUCOPHAGE-XR) 750 MG 24 hr tablet Take 750 mg by mouth daily with breakfast.    . metoprolol tartrate (LOPRESSOR) 25 MG tablet Take 1 tablet (25 mg total) by mouth 2 (two) times daily. 60 tablet 0  . mirtazapine (REMERON) 45 MG tablet Take 45 mg by mouth at bedtime.        Marland Kitchen  PHYSICAL EXAMINATION:  Vitals:    11/16/15 0400 11/16/15 1159  BP: 116/64 104/64  Pulse: 76 84  Resp: 20 17  Temp: 98.6 F (37 C) 99 F (37.2 C)   Filed Weights   11/14/15 0226 11/14/15 1030  Weight: 155 lb (70.3 kg) 145 lb 15.1 oz (66.2 kg)    GENERAL: Well-nourished well-developed; Alert, no distress and comfortable.   Accompanied by daughter. EYES: no pallor or icterus OROPHARYNX: no thrush or ulceration. NECK: supple, no masses felt LYMPH:  no palpable lymphadenopathy in the cervical, axillary or inguinal regions LUNGS: decreased breath sounds to auscultation at bases and  No wheeze or crackles HEART/CVS: regular rate & rhythm and no murmurs; No lower extremity edema ABDOMEN: abdomen soft, non-tender and normal bowel sounds Musculoskeletal:no cyanosis of digits and no clubbing  PSYCH: alert & oriented x 3 with fluent speech NEURO: no focal motor/sensory deficits SKIN:  no rashes or significant lesions  LABORATORY DATA:  I have reviewed the data as listed Lab Results  Component Value Date   WBC 13.7 (H) 11/15/2015   HGB 9.1 (L) 11/15/2015   HCT 28.1 (L) 11/15/2015   MCV 82.2 11/15/2015   PLT 219 11/15/2015    Recent Labs  10/04/15 0830  10/24/15 0412 10/31/15 0804 11/01/15 0459 11/14/15 0337 11/15/15 0417  NA 135  < > 133* 136 133* 134*  135  K 4.4  < > 4.0 3.3* 3.4* 3.3* 4.0  CL 103  < > 96* 99* 95* 94* 101  CO2 26  < > 22 27 28 25 26   GLUCOSE 105*  < > 80 116* 91 118* 90  BUN 14  < > 12 11 10 18 11   CREATININE 0.94  < > 0.68 0.54 0.82 0.65 0.56  CALCIUM 9.5  < > 9.8 10.2 10.0 10.6* 9.4  GFRNONAA >60  < > >60 >60 >60 >60 >60  GFRAA >60  < > >60 >60 >60 >60 >60  PROT 7.9  --  8.2* 7.5  --   --   --   ALBUMIN 3.5  --  3.6 3.2*  --   --   --   AST 19  --  21 16  --   --   --   ALT 15  --  16 11*  --   --   --   ALKPHOS 130*  --  125 125  --   --   --   BILITOT 0.3  --  1.0 0.5  --   --   --   < > = values in this interval not displayed.  RADIOGRAPHIC STUDIES: I have personally  reviewed the radiological images as listed and agreed with the findings in the report. Dg Chest 1 View  Result Date: 10/24/2015 CLINICAL DATA:  60 year old female status post left thoracentesis. EXAM: CHEST 1 VIEW COMPARISON:  Preoperative chest x-ray 10/24/2015 at 4:39 a.m. FINDINGS: Stable position of left subclavian approach single-lumen power injectable port catheter with the catheter tip overlying the mid SVC. Stable extensive left pleural thickening and loculated left lower lobe pleural effusion. No evidence of pneumothorax. Persistent volume loss in the left hemi thorax. The cardiac and mediastinal contours remain unchanged. Aortic atherosclerosis is similar compared to prior. Slightly low inspiratory volumes with right basilar atelectasis. No acute osseous abnormality. Lytic lesion in the lateral aspect of the left seventh rib. IMPRESSION: No evidence of pneumothorax status post left thoracentesis. Electronically Signed   By: Jacqulynn Cadet M.D.   On: 10/24/2015 17:11   Dg Chest 2 View  Result Date: 11/14/2015 CLINICAL DATA:  60 y/o F; lung cancer with metastasis to spine. Back pain without relief. EXAM: CHEST  2 VIEW COMPARISON:  Concurrent thoracic spine radiographs. Chest CT dated 10/31/2015. FINDINGS: Elevated left hemidiaphragm and consolidation of the left mid and lower lung zones as well as diffuse left-sided pleural thickening is without significant interval change in comparison with the prior CT of the chest. Cardiac silhouette is stable. Port catheter with tip projecting over the mid SVC. No consolidation in the right lung. There are several lucencies in the ribs bilaterally corresponding to osseous metastasis on prior CT. New mildly displaced right lateral fourth and sixth rib fractures. Healed right posterior fifth rib fracture. Previously identified pathologic fractures at right 10 and left 3 and 7 are not clearly appreciated. IMPRESSION: 1. Stable elevated left hemidiaphragm, left mid  and lower lung zone consolidations, and left-sided pleural thickening. 2. Mildly displaced right lateral T4 and T6 rib fractures are new. Additional pathologic fractures identified on prior CT may are not clearly appreciated. Electronically Signed   By: Kristine Garbe M.D.   On: 11/14/2015 04:26   Dg Chest 2 View  Result Date: 10/31/2015 CLINICAL DATA:  Shortness of breath and left-sided pain EXAM: CHEST  2 VIEW COMPARISON:  10/24/2015 FINDINGS: Complex left pleural effusion with lower lobe  mass and diffuse pleural thickening. No pneumothorax or interval collapse. Clear right lung. Normal heart size and stable mediastinal contours. Porta catheter on the left with tip at the SVC level. IMPRESSION: No acute finding. Left pleural thickening and loculated fluid is stable compared 10/24/2015. Electronically Signed   By: Monte Fantasia M.D.   On: 10/31/2015 07:03   Dg Chest 2 View  Result Date: 10/24/2015 CLINICAL DATA:  Severe LEFT lower back pain beginning last week, with vomiting and diaphoresis. History of lung cancer, on chemotherapy. EXAM: CHEST  2 VIEW COMPARISON:  Chest radiograph Aug 12, 2015 FINDINGS: Large LEFT pleural effusion, increased from prior examination with underlying consolidation/mass. RIGHT lung is clear. The cardiac silhouette is mildly enlarged. Calcified aortic knob. Single lumen LEFT chest port with distal tip projecting in proximal superior vena cava. No pneumothorax. Soft tissue planes included osseous structures are nonsuspicious. IMPRESSION: Large LEFT pleural effusion with underlying consolidation/mass. Electronically Signed   By: Elon Alas M.D.   On: 10/24/2015 05:05   Dg Thoracic Spine 2 View  Result Date: 11/14/2015 CLINICAL DATA:  60 y/o F; lung cancer with metastasis to the spine. Back pain without relief. EXAM: THORACIC SPINE 2 VIEWS COMPARISON:  Chest CT dated 10/31/2015. Concurrent radiographs of the chest. FINDINGS: Vertebral body heights are preserved.  No acute fracture is identified. Multiple osseous lesions are better characterized on the prior CT of the chest. Port catheter with tip projecting over the mid SVC. Moderate degenerative changes of the mid thoracic spine. IMPRESSION: No acute fracture or dislocation is identified. Multiple osseous metastasis are better characterized on the prior CT of the chest. Electronically Signed   By: Kristine Garbe M.D.   On: 11/14/2015 04:18   Dg Abd 1 View  Result Date: 11/14/2015 CLINICAL DATA:  Left upper quadrant pain onset today. EXAM: ABDOMEN - 1 VIEW COMPARISON:  None. FINDINGS: Nonobstructive bowel gas pattern. Oral contrast material noted within the renal collecting systems and bladder. Bilateral tubal ligation clips noted. No free air organomegaly. IMPRESSION: No acute findings. Electronically Signed   By: Rolm Baptise M.D.   On: 11/14/2015 11:46   Ct Head Wo Contrast  Result Date: 11/14/2015 CLINICAL DATA:  60 y/o F; history of lung cancer with metastasis to the spine. Patient is presenting with confusion. EXAM: CT HEAD WITHOUT CONTRAST TECHNIQUE: Contiguous axial images were obtained from the base of the skull through the vertex without intravenous contrast. COMPARISON:  MRI of the brain dated 11/01/2015 and CT of the head dated 07/29/2015. FINDINGS: Brain: No evidence of acute infarction, hemorrhage, hydrocephalus, extra-axial collection or mass lesion/mass effect. 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 Vascular: No hyperdense vessel. Mild internal carotid artery calcifications. Skull: There are multiple lucent lesions to the skull the largest of which are seen in the right parietal bone and within the clivus consistent with osseous metastatic disease. No gross interval change in comparison with prior MRI given differences in technique. Sinuses/Orbits: No acute finding. Other: None. IMPRESSION: 1. No acute intracranial abnormality. Stable small chronic right parietal infarct.  2. 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 Electronically Signed   By: Kristine Garbe M.D.   On: 11/14/2015 05:24   Ct Angio Chest Pe W Or Wo Contrast  Result Date: 11/14/2015 CLINICAL DATA:  Chest pain EXAM: CT ANGIOGRAPHY CHEST WITH CONTRAST TECHNIQUE: Multidetector CT imaging of the chest was performed using the standard protocol during bolus administration of intravenous contrast. Multiplanar CT image reconstructions and  MIPs were obtained to evaluate the vascular anatomy. CONTRAST:  75 cc Isovue 300 COMPARISON:  10/31/2015 FINDINGS: Motion artifact degrades the study. There are no obvious filling defects in the pulmonary arterial tree to suggest acute pulmonary thromboembolism. Pleural and parenchymal disease throughout the left hemi thorax is stable compatible with advanced malignancy. Left subclavian Port-A-Cath with its tip in the SVC is stable Patchy indeterminate pulmonary opacities have developed throughout the right mid and lower lung. Bony metastatic disease is stable. No pneumothorax. Atherosclerotic changes throughout the mediastinum are stable. Review of the MIP images confirms the above findings. IMPRESSION: No evidence of acute pulmonary thromboembolism. Exam is limited by motion artifact Patchy pulmonary opacities of developed in the right lung worrisome for an inflammatory process. Also consider atypical edema. Stable advance malignancy within the left hemi thorax and metastatic disease. Electronically Signed   By: Marybelle Killings M.D.   On: 11/14/2015 07:59   Ct Angio Chest Pe W/cm &/or Wo Cm  Result Date: 10/31/2015 CLINICAL DATA:  60 year old female with known carcinoma, metastatic. History of lung carcinoma New complaints of back pain in chills generalized body aches and fevers. EXAM: CT CHEST, ABDOMEN, AND PELVIS WITH CONTRAST TECHNIQUE: Multidetector CT imaging of the chest, abdomen and pelvis was performed following the standard protocol during bolus  administration of intravenous contrast. CONTRAST:  85 cc Isovue 370 COMPARISON:  CT 10/05/2015, 07/29/2015 FINDINGS: CT CHEST FINDINGS Port catheter on the left chest wall via subclavian vein. No axillary or supraclavicular adenopathy. Unremarkable appearance of the thoracic inlet, including the visualized thyroid. Several mediastinal lymph nodes present, similar to comparison CT. None of these are enlarged by CT size criteria. Small hiatal hernia. Calcifications of the ascending and descending thoracic aorta. No dissection. No periaortic fluid. No aneurysm. Calcifications of the branch vessels. No central, lobar, segmental, or proximal subsegmental filling defects to indicate pulmonary emboli. Heart size unchanged, within normal limits. Pericardial fluid along the left lateral pericardium associated with the left-sided pleural disease extending into the mediastinum. Soft tissue extends from the left-sided pleural disease to the posterior aspect of the left lateral ventricle, with poor visualization of the pericardium. Calcifications of native coronary vessels including left main, left anterior descending, circumflex, right coronary arteries. Similar appearance of circumferential soft tissue/fluid of the left pleura, with complete involvement of the parietal pleura. Left upper lobe relatively well aerated, with increasing atelectasis/scarring of the lingula. Complete collapse of the left lower lobe again with the greatest diameter of mass measuring 4.3 cm x 4.9 cm, similar to the comparison CT. Moderate volume of pleural fluid at the left base. Loss of volume on the left contributes to slight right to left shift of the mediastinal structures. No right-sided confluent airspace disease. No pleural effusion. Nodule within the superior segment of the right lower lobe measures 8 -9 mm, unchanged from the comparison CT. Small nodule within the posterior aspect of the right upper lobe (image 35). Small nodule within the  fissure on image 33. Nodules are new from the CT dated 07/29/2015. Musculoskeletal: Lucent lesions involving the left third rib, fifth rib, seventh rib, eighth rib, ninth rib, tenth rib. Lucent lesions involving the right fourth rib, fifth rib, sixth rib, ninth rib, tenth rib. Pathologic fracture at the posterior tenth rib. Progressing lucent lesions of the manubrium, sternum, as well as the thoracic vertebral bodies of T2, T3, T4, T5, T7, T8, T9, T10, T12. CT ABDOMEN PELVIS FINDINGS Abdomen/pelvis: Unremarkable appearance of liver and spleen. Unremarkable appearance of bilateral adrenal glands.  No peripancreatic or pericholecystic fluid or inflammatory changes. No radio-opaque gallstones. No intrahepatic or extrahepatic biliary ductal dilatation. No intra-peritoneal free air or significant free-fluid. 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 Appendix is not visualized, however, no inflammatory changes are present adjacent to the cecum to indicate an appendicitis. Circumferential thickening of the soft tissue involving the antrum of the stomach in the pylorus. No inflammatory changes of the mesenteric fat. No transition point or dilated bowel. Right Kidney/Ureter: No hydronephrosis. No nephrolithiasis. No perinephric stranding. Unremarkable course of the right ureter. Left Kidney/Ureter: No hydronephrosis. No nephrolithiasis. No perinephric stranding. Unremarkable course of the left ureter. Cystic lesion at the inferior left kidney again noted. Unremarkable appearance of the urinary bladder. Hysterectomy. Calcifications of the abdominal aorta and iliac vasculature. Musculoskeletal: Interval progression of disease of the lumbar spine with new metastatic lucent lesions involving L1, L2, L3, L4, and L5. New lesions of the bilateral sacrum, sacral base, and bilateral pelvic bones. New lesions involving the proximal femurs. No significant degenerative changes of the spine. IMPRESSION: Significant  progression of skeletal metastatic disease, with new and enlarging lesions throughout the majority of the thoracic and lumbar spine, bilateral ribs, sternum, sacrum, bilateral pelvis, left humerus, left scapula, and bilateral femurs. Pathologic fracture of the posterior right tenth rib, as well as the left third rib and left seventh rib. Similar appearance of left thoracic metastatic disease with circumferential soft tissue/fluid of the parietal and visceral pleura, complete volume loss of the left lower lobe, small pleural effusion, and invasion of the mediastinal structures. Small nodules of the right upper lobe and left lower lobe, concerning for progression of pulmonary Mets in the right chest. Circumferential soft tissue thickening in the distal stomach at the pylorus. This may represent gastritis, and correlation with symptoms and potentially upper endoscopy may be considered. Signed, Dulcy Fanny. Earleen Newport, DO [facility] Electronically Signed   By: Corrie Mckusick D.O.   On: 10/31/2015 12:34   Mr Jeri Cos VH Contrast  Result Date: 11/01/2015 CLINICAL DATA:  Metastatic lung cancer. EXAM: MRI HEAD WITHOUT AND WITH CONTRAST TECHNIQUE: Multiplanar, multiecho pulse sequences of the brain and surrounding structures were obtained without and with intravenous contrast. CONTRAST:  38m MULTIHANCE GADOBENATE DIMEGLUMINE 529 MG/ML IV SOLN COMPARISON:  08/14/2015 FINDINGS: Interval development of multiple enhancing bone lesions compatible with metastatic disease. There is a 15 mm lesion in the right clivus which was not present previously. This may be eroding the floor of the sella. No significant invasion of the cavernous sinus. 15 mm enhancing lesion in the right parietal bone is new. Smaller enhancing lesions in the parietal bone bilaterally all compatible with metastatic disease. Postcontrast imaging demonstrates mild motion degrading image quality. Allowing for  this, no metastatic disease is present in the brain. No enhancing mass lesion in the brain. Leptomeningeal enhancement normal. Ventricle size normal. Cerebral volume normal. Negative for acute infarct. Mild chronic microvascular ischemic change in the white matter similar to the prior study. Mild mucosal edema in the paranasal sinuses. No orbital mass lesion. IMPRESSION: Interval development of metastatic disease to the skull and skullbase compared with the MRI of 08/14/2015. Findings indicate rapid tumor growth. Negative for metastatic disease to the brain. Electronically Signed   By: CFranchot GalloM.D.   On: 11/01/2015 11:26   Ct Abdomen Pelvis W Contrast  Result Date: 10/31/2015 CLINICAL DATA:  60year old female with known carcinoma, metastatic. History of lung carcinoma New complaints of back pain in chills generalized  body aches and fevers. EXAM: CT CHEST, ABDOMEN, AND PELVIS WITH CONTRAST TECHNIQUE: Multidetector CT imaging of the chest, abdomen and pelvis was performed following the standard protocol during bolus administration of intravenous contrast. CONTRAST:  85 cc Isovue 370 COMPARISON:  CT 10/05/2015, 07/29/2015 FINDINGS: CT CHEST FINDINGS Port catheter on the left chest wall via subclavian vein. No axillary or supraclavicular adenopathy. Unremarkable appearance of the thoracic inlet, including the visualized thyroid. Several mediastinal lymph nodes present, similar to comparison CT. None of these are enlarged by CT size criteria. Small hiatal hernia. Calcifications of the ascending and descending thoracic aorta. No dissection. No periaortic fluid. No aneurysm. Calcifications of the branch vessels. No central, lobar, segmental, or proximal subsegmental filling defects to indicate pulmonary emboli. Heart size unchanged, within normal limits. Pericardial fluid along the left lateral pericardium associated with the left-sided pleural disease extending into the mediastinum. Soft tissue extends from the  left-sided pleural disease to the posterior aspect of the left lateral ventricle, with poor visualization of the pericardium. Calcifications of native coronary vessels including left main, left anterior descending, circumflex, right coronary arteries. Similar appearance of circumferential soft tissue/fluid of the left pleura, with complete involvement of the parietal pleura. Left upper lobe relatively well aerated, with increasing atelectasis/scarring of the lingula. Complete collapse of the left lower lobe again with the greatest diameter of mass measuring 4.3 cm x 4.9 cm, similar to the comparison CT. Moderate volume of pleural fluid at the left base. Loss of volume on the left contributes to slight right to left shift of the mediastinal structures. No right-sided confluent airspace disease. No pleural effusion. Nodule within the superior segment of the right lower lobe measures 8 -9 mm, unchanged from the comparison CT. Small nodule within the posterior aspect of the right upper lobe (image 35). Small nodule within the fissure on image 33. Nodules are new from the CT dated 07/29/2015. Musculoskeletal: Lucent lesions involving the left third rib, fifth rib, seventh rib, eighth rib, ninth rib, tenth rib. Lucent lesions involving the right fourth rib, fifth rib, sixth rib, ninth rib, tenth rib. Pathologic fracture at the posterior tenth rib. Progressing lucent lesions of the manubrium, sternum, as well as the thoracic vertebral bodies of T2, T3, T4, T5, T7, T8, T9, T10, T12. CT ABDOMEN PELVIS FINDINGS Abdomen/pelvis: Unremarkable appearance of liver and spleen. Unremarkable appearance of bilateral adrenal glands. No peripancreatic or pericholecystic fluid or inflammatory changes. No radio-opaque gallstones. No intrahepatic or extrahepatic biliary ductal dilatation. No intra-peritoneal free air or significant free-fluid. 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 Appendix is not visualized,  however, no inflammatory changes are present adjacent to the cecum to indicate an appendicitis. Circumferential thickening of the soft tissue involving the antrum of the stomach in the pylorus. No inflammatory changes of the mesenteric fat. No transition point or dilated bowel. Right Kidney/Ureter: No hydronephrosis. No nephrolithiasis. No perinephric stranding. Unremarkable course of the right ureter. Left Kidney/Ureter: No hydronephrosis. No nephrolithiasis. No perinephric stranding. Unremarkable course of the left ureter. Cystic lesion at the inferior left kidney again noted. Unremarkable appearance of the urinary bladder. Hysterectomy. Calcifications of the abdominal aorta and iliac vasculature. Musculoskeletal: Interval progression of disease of the lumbar spine with new metastatic lucent lesions involving L1, L2, L3, L4, and L5. New lesions of the bilateral sacrum, sacral base, and bilateral pelvic bones. New lesions involving the proximal femurs. No significant degenerative changes of the spine. IMPRESSION: Significant progression of skeletal metastatic disease, with new and enlarging lesions throughout the majority  of the thoracic and lumbar spine, bilateral ribs, sternum, sacrum, bilateral pelvis, left humerus, left scapula, and bilateral femurs. Pathologic fracture of the posterior right tenth rib, as well as the left third rib and left seventh rib. Similar appearance of left thoracic metastatic disease with circumferential soft tissue/fluid of the parietal and visceral pleura, complete volume loss of the left lower lobe, small pleural effusion, and invasion of the mediastinal structures. Small nodules of the right upper lobe and left lower lobe, concerning for progression of pulmonary Mets in the right chest. Circumferential soft tissue thickening in the distal stomach at the pylorus. This may represent gastritis, and correlation with symptoms and potentially upper endoscopy may be considered. Signed,  Dulcy Fanny. Earleen Newport, DO [facility] Electronically Signed   By: Corrie Mckusick D.O.   On: 10/31/2015 12:34   Dg Abd Acute W/chest  Result Date: 10/31/2015 CLINICAL DATA:  Generalized body aches, sick to her stomach, mid back pain, history lung cancer, COPD, diabetes mellitus, hypertension, smoking EXAM: DG ABDOMEN ACUTE W/ 1V CHEST COMPARISON:  Earlier chest radiograph of 10/31/2015, abdominal radiograph 07/24/2012 FINDINGS: LEFT subclavian Port-A-Cath with tip projecting over SVC. Enlargement of cardiac silhouette with slight vascular congestion. Atherosclerotic calcification aorta. Persistent LEFT pleural effusion and LEFT lung atelectasis greatest at bases. Minimal atelectasis RIGHT lung base with RIGHT lung otherwise clear. No pneumothorax. Bones demineralized. Nonobstructive bowel gas pattern. Air-filled normal upper normal caliber loops of small bowel in the LEFT mid abdomen. No bowel dilatation, bowel wall thickening, or free air. Surgical clips in pelvis bilaterally. No urinary tract calcification definitely visualized. IMPRESSION: Nonobstructive bowel gas pattern. Persistent LEFT pleural effusion and LEFT lung atelectasis greatest at LEFT base. Electronically Signed   By: Lavonia Dana M.D.   On: 10/31/2015 08:11   US Thoracentesis Asp Pleural Space W/img Guide  Result Date: 10/24/2015 INDICATION: 60 year old female with history of left lung cancer and persistent loculated left pleural effusion. EXAM: ULTRASOUND GUIDED LEFT THORACENTESIS MEDICATIONS: None. COMPLICATIONS: None immediate. PROCEDURE: An ultrasound guided thoracentesis was thoroughly discussed with the patient and questions answered. The benefits, risks, alternatives and complications were also discussed. The patient understands and wishes to proceed with the procedure. Written consent was obtained. Ultrasound was performed to localize and mark an adequate pocket of fluid in the left  chest. The area was then prepped and draped in the normal sterile fashion. 1% Lidocaine was used for local anesthesia. Under ultrasound guidance a 19 gauge, 7-cm, Yueh catheter was introduced. Thoracentesis was performed. The catheter was removed and a dressing applied. FINDINGS: A total of approximately 70 mL of reddish brown pleural fluid was removed. Samples were sent to the laboratory as requested by the clinical team. IMPRESSION: Successful ultrasound guided left thoracentesis yielding 70 mL of pleural fluid. Of note, the left basilar pleural effusion is loculated and highly complex. Further aspiration is unlikely to yield significantly more fluid. Electronically Signed   By: Jacqulynn Cadet M.D.   On: 10/24/2015 17:12    ASSESSMENT & PLAN:   # Metastatic adenocarcinoma the lung currently status post 3 cycles of carboplatin and Alimta Keytruda- currently for increasing pain/nonsustained V. Tach.  # Metastatic adenocarcinoma the lung- post-3 cycles-with significant progression of disease noted on imaging with multiple lytic lesions in vertebral bodies/pelvis. Met with palliative care; patient agrees to hospice. We will plan hospice at home.  # CODE STATUS-  DNR/DNI.   # Discussed with the patient that goal of care is to keep her  comfortable. She will follow-up with me in the office only as needed. Continue hospice care at home; if significant decline in her clinical status recommend hospice home.   # Also spoke to [facility] care nurse- regarding the above plan.      Cammie Sickle, MD 11/16/2015 5:55 PM

## 2015-11-16 NOTE — Care Management Note (Addendum)
Case Management Note  Patient Details  Name: Charlene Williams MRN: [id number] Date of Birth: [birth year]  Subjective/Objective:  Reviewed palliative care note.  Met with patient at bedside. She is agreeable to proceed with hospice referral. TC to daughter, she prefers [facility]. DME needed: hospital bed, shower chair and walker.   Action/Plan: Referral to Santiago Glad with [facility]. It is anticipated that patient will discharge today.  Notified [facility] of POC.                 Expected Discharge Date:  11/15/15               Expected Discharge Plan:  Home w Hospice Care  In-House Referral:  Hospice / Palliative Care  Discharge planning Services  CM Consult  Post Acute Care Choice:    Choice offered to:  Patient, Adult Children  DME Arranged:    DME Agency:     HH Arranged:    [place] Agency:  [facility]  Status of Service:  In process, will continue to follow  If discussed at Long Length of Stay Meetings, dates discussed:    Additional Comments:  Jolly Mango, RN 11/16/2015, 10:17 AM

## 2015-11-16 NOTE — Progress Notes (Signed)
New referral for [facility] services at home following discharge received from [facility]. Writer met in the room with patient briefly this morning, she was having nausea/vomiting. Staff RN Amy notified. Writer contacted patient's daughter Roderic Ovens via phone, DME requested includes a hospital bed, shower chair and front wheeled walker. Patient has oxygen in place at home through [facility]. Patient information faxed to hospice referral. Plan is for discharge home via car today. DNR in place. Full note to follow. Thank you. Flo Shanks RN, BSN, [facility], [facility] [phone] c

## 2015-11-17 ENCOUNTER — Telehealth: Payer: Self-pay | Admitting: *Deleted

## 2015-11-17 ENCOUNTER — Ambulatory Visit: Payer: Medicare Other

## 2015-11-17 NOTE — Telephone Encounter (Signed)
Called to report that her O2 sat on 3 L/m of nasal O2 was 77%. She increased flow rate to 4 L/m and only got her to low to mid 80's. She is lethargic, but arouses easily. She is not in any distress. Any orders?

## 2015-11-17 NOTE — Telephone Encounter (Signed)
Per Dr B, she is hospice, this is expected and she should be given morphine to relax

## 2015-11-20 ENCOUNTER — Ambulatory Visit: Payer: Medicare Other

## 2015-11-21 ENCOUNTER — Ambulatory Visit: Payer: Medicare Other

## 2015-11-22 ENCOUNTER — Ambulatory Visit: Payer: Medicare Other

## 2015-11-23 ENCOUNTER — Ambulatory Visit: Payer: Medicare Other

## 2015-11-24 ENCOUNTER — Ambulatory Visit: Payer: Medicare Other

## 2015-11-24 DEATH — deceased

## 2015-11-28 ENCOUNTER — Ambulatory Visit: Payer: Medicare Other

## 2015-11-29 NOTE — Discharge Summary (Signed)
[facility] [place] NAME: Charlene Williams    MR#:  [id number]  DATE OF BIRTH:  1955/11/05  DATE OF ADMISSION:  11/14/2015 ADMITTING PHYSICIAN: Saundra Shelling, MD  DATE OF DISCHARGE: 11/16/2015  4:44 PM  PRIMARY CARE PHYSICIAN: HOLLAND, [place], NP    ADMISSION DIAGNOSIS:  Ventricular tachycardia ([place]) [I47.2] Hypoxia [R09.02] Elevated troponin [R79.89] Rib fracture, left, closed, initial encounter [S22.32XA] Chest pain, unspecified chest pain type [R07.9] Thoracic back pain, unspecified back pain laterality [M54.6]  DISCHARGE DIAGNOSIS:  Principal Problem:   Non-STEMI (non-ST elevated myocardial infarction) ([place]) Active Problems:   Community acquired pneumonia   DNR (do not resuscitate)   Palliative care by specialist   Metastatic lung cancer (metastasis from lung to other site) [facility])   SECONDARY DIAGNOSIS:   Past Medical History:  Diagnosis Date  . Aneurysm ([place])   . Chest pain, pleuritic   . COPD (chronic obstructive pulmonary disease) ([place])    on home oxygen  . Depression   . Diabetes mellitus without complication ([place])   . Family history of chronic pain 01/17/2015  . Hiatal hernia   . Hypercholesteremia   . Hypertension   . Illicit drug use   . Lung cancer ([place])    adenocarcinoma left lung  . Migraines   . Sleep apnea   . Spine disorder     HOSPITAL COURSE:     Community acquired pneumonia #1. Elevated troponin, likely demand ischemia due to nonsustained V. tach, continue patient on amiodarone, Changed to oral, patient is in sinus rhythm now in the 70s to 80s, appreciate cardiologist recommendations, no interventions but palliative care. CT angiogram was negative for pulmonary embolism #2. Abdominal and chest pain, due to metastatic disease, supportive therapy is given, patient improved , CT chest did show possible inflammation versus atypical edema, likely due to V. tach, abdominal x-ray revealed no  constipation, patient had CT scan of abdomen and pelvis recently in August 2017, revealing progression metastatic skeletal disease, fractured ribs, possible gastritis, continue patient on PPI, Carafate. Palliative care discussed care with family and patient and decided on return back home  with hospice.CT angiogram was negative for pulmonary embolism.  #3. Bacterial pneumonia, continue patient on levofloxacin, unable to get sputum cultures, MRSA PCR is negative, discontinued vancomycin #4. Leukocytosis, improved with therapy   #5. Hypokalemia, supplementede orally, resolved, magnesium level was checked, normal #6. 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 #7. Hypotension, continue patient on low rate IV fluids at 40 cc an hour   later BP was stable.  DISCHARGE CONDITIONS:   Stable.  CONSULTS OBTAINED:  Treatment Team:  Dionisio David, MD  DRUG ALLERGIES:   Allergies  Allergen Reactions  . Ampicillin Hives and Other (See Comments)    Has patient had a PCN reaction causing immediate rash, facial/tongue/throat swelling, SOB or lightheadedness with hypotension: No Has patient had a PCN reaction causing severe rash involving mucus membranes or skin necrosis: No Has patient had a PCN reaction that required hospitalization No Has patient had a PCN reaction occurring within the last 10 years: Yes If all of the above answers are "NO", then may proceed with Cephalosporin use.  Marland Kitchen Penicillins Hives and Other (See Comments)    Has patient had a PCN reaction causing immediate rash, facial/tongue/throat swelling, SOB or lightheadedness with hypotension: No Has patient had a PCN reaction  causing severe rash involving mucus membranes or skin necrosis: No Has patient had a PCN reaction that required hospitalization No Has patient had a PCN reaction occurring within the last 10  years: Yes If all of the above answers are "NO", then may proceed with Cephalosporin use.    DISCHARGE MEDICATIONS:   Discharge Medication List as of 11/16/2015  3:12 PM    START taking these medications   Details  amiodarone (PACERONE) 400 MG tablet Take 1 tablet (400 mg total) by mouth daily., Starting Thu 11/16/2015, Print    levofloxacin (LEVAQUIN) 750 MG tablet Take 1 tablet (750 mg total) by mouth daily., Starting Thu 11/16/2015, Print      CONTINUE these medications which have NOT CHANGED   Details  albuterol (PROVENTIL HFA;VENTOLIN HFA) 108 (90 Base) MCG/ACT inhaler Inhale 2 puffs into the lungs every 6 (six) hours as needed for wheezing or shortness of breath., Starting Sat 08/12/2015, Normal    alendronate (FOSAMAX) 70 MG tablet Take 70 mg by mouth once a week. Pt takes on Tuesday.   Take with a full glass of water on an empty stomach., Historical Med    apixaban (ELIQUIS) 5 MG TABS tablet Take 1 tablet (5 mg total) by mouth 2 (two) times daily., Starting Sun 07/30/2015, Print    aspirin EC 81 MG tablet Take 81 mg by mouth daily., Historical Med    cetirizine (ZYRTEC) 10 MG tablet Take 10 mg by mouth at bedtime., Historical Med    docusate sodium (COLACE) 100 MG capsule Take 1 capsule (100 mg total) by mouth 2 (two) times daily., Starting Thu 11/02/2015, Normal    DULoxetine (CYMBALTA) 30 MG capsule Take 90 mg by mouth daily. , Historical Med    fentaNYL (DURAGESIC - DOSED MCG/HR) 75 MCG/HR Place 1 patch (75 mcg total) onto the skin every 3 (three) days., Starting Thu 11/02/2015, Print    fluticasone (FLOVENT HFA) 220 MCG/ACT inhaler Inhale 1 puff into the lungs 2 (two) times daily., Historical Med    folic acid (FOLVITE) 1 MG tablet Take 1 mg by mouth daily., Historical Med    gabapentin (NEURONTIN) 800 MG tablet Take 800 mg by mouth 3 (three) times daily., Historical Med    HYDROcodone-acetaminophen (NORCO/VICODIN) 5-325 MG tablet Take 1-2 tablets by mouth every 4 (four)  hours as needed for moderate pain., Starting Wed 10/25/2015, Print    HYDROmorphone (DILAUDID) 2 MG tablet Take 1 tablet (2 mg total) by mouth every 4 (four) hours as needed for severe pain., Starting Thu 11/02/2015, Print    lidocaine-prilocaine (EMLA) cream Apply 1 application topically as needed (prior to accessing port)., Historical Med    magnesium oxide (MAG-OX) 400 (241.3 Mg) MG tablet Take 400 mg by mouth at bedtime., Historical Med    metFORMIN (GLUCOPHAGE-XR) 750 MG 24 hr tablet Take 750 mg by mouth daily with breakfast., Historical Med    metoprolol tartrate (LOPRESSOR) 25 MG tablet Take 1 tablet (25 mg total) by mouth 2 (two) times daily., Starting Tue 06/27/2015, Normal    mirtazapine (REMERON) 45 MG tablet Take 45 mg by mouth at bedtime., Historical Med      STOP taking these medications     omeprazole (PRILOSEC) 20 MG capsule      ondansetron (ZOFRAN) 8 MG tablet      polyethylene glycol (MIRALAX / GLYCOLAX) packet      prochlorperazine (COMPAZINE) 10 MG tablet      promethazine (PHENERGAN) 25 MG suppository  simvastatin (ZOCOR) 20 MG tablet      traZODone (DESYREL) 150 MG tablet          DISCHARGE INSTRUCTIONS:    Follow with PMD , as needed.  If you experience worsening of your admission symptoms, develop shortness of breath, life threatening emergency, suicidal or homicidal thoughts you must seek medical attention immediately by calling 911 or calling your MD immediately  if symptoms less severe.  You Must read complete instructions/literature along with all the possible adverse reactions/side effects for all the Medicines you take and that have been prescribed to you. Take any new Medicines after you have completely understood and accept all the possible adverse reactions/side effects.   Please note  You were cared for by a hospitalist during your hospital stay. If you have any questions about your discharge medications or the care you received while  you were in the hospital after you are discharged, you can call the unit and asked to speak with the hospitalist on call if the hospitalist that took care of you is not available. Once you are discharged, your primary care physician will handle any further medical issues. Please note that NO REFILLS for any discharge medications will be authorized once you are discharged, as it is imperative that you return to your primary care physician (or establish a relationship with a primary care physician if you do not have one) for your aftercare needs so that they can reassess your need for medications and monitor your lab values.    Today   CHIEF COMPLAINT:   Chief Complaint  Patient presents with  . Back Pain    HISTORY OF PRESENT ILLNESS:  Charlene Williams  is a 60 y.o. female with a known history of Metastatic lung cancer, COPD on home oxygen, diabetes mellitus type 2, hyperlipidemia, hiatal hernia, hypertension, sleep apnea presented to the emergency room with chest pain and back pain. Patient has back pain present as 7 days and see aching in nature 5 out of 10 on a scale of 1-10. 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 She also complains of pain in the chest which is located in the substernal area was sharp in nature 6 out of 10 on a scale of 1-10. In the emergency room patient was evaluated and her first set of troponin was abnormal around 0.11. Patient's heart rate was fluctuating anywhere between 80 and 130 bpm. She had runs of  ventricular tachycardia which was nonsustained. Patient was started on IV amiodarone drip after a loading dose in the emergency room. Chest x-ray during the workup showed consolidation in the left lung. No complaints of any fever or chills. Patient has increased pain in chest on palpation.Has occasional cough. No complaints of orthopnea or proximal nocturnal dyspnea. No bowel or bladder incontinence. Hospitalist service was consulted for further care  of the patient.   VITAL SIGNS:  Blood pressure 104/64, pulse 84, temperature 99 F (37.2 C), temperature source Oral, resp. rate 17, height '5\' 2"'$  (1.575 m), weight 66.2 kg (145 lb 15.1 oz), SpO2 90 %.  I/O:  No intake or output data in the 24 hours ending 11/29/15 1019  PHYSICAL EXAMINATION:   GENERAL:  60 y.o.-year-old patient lying in the bed in no significant distress , more comfortable and conversive, smiles  EYES: Pupils equal, round, reactive to light and accommodation. No scleral icterus. Extraocular muscles intact.  HEENT: Head atraumatic, normocephalic. Oropharynx and nasopharynx clear.  NECK:  Supple, no jugular venous  distention. No thyroid enlargement, no tenderness.  LUNGS: Diminished breath sounds bilaterally, no wheezing, few rales,rhonchi , but no crepitations noted. Not using accessory muscles of respiration, comfortable.  CARDIOVASCULAR: S1, S2 normal. No murmurs, rubs, or gallops.  ABDOMEN: Soft, diffusely tender, no rebound or guarding was noted, nondistended. Bowel sounds present. No organomegaly or mass.  EXTREMITIES: No pedal edema, cyanosis, or clubbing.  NEUROLOGIC: Cranial nerves II through XII are intact. Muscle strength 5/5 in all extremities. Sensation intact. Gait not checked.  PSYCHIATRIC: The patient is alert , oriented 3 SKIN: No obvious rash, lesion, or ulcer.   DATA REVIEW:   CBC No results for input(s): WBC, HGB, HCT, PLT in the last 168 hours.  Chemistries  No results for input(s): NA, K, CL, CO2, GLUCOSE, BUN, CREATININE, CALCIUM, MG, AST, ALT, ALKPHOS, BILITOT in the last 168 hours.  Invalid input(s): GFRCGP  Cardiac Enzymes No results for input(s): TROPONINI in the last 168 hours.  Microbiology Results  Results for orders placed or performed during the hospital encounter of 11/14/15  MRSA PCR Screening     Status: None   Collection Time: 11/14/15  9:30 AM  Result Value Ref Range Status   MRSA by PCR NEGATIVE NEGATIVE Final     Comment:        The GeneXpert MRSA Assay (FDA approved for NASAL specimens only), is one component of a comprehensive MRSA colonization surveillance program. It is not intended to diagnose MRSA infection nor to guide or monitor treatment for MRSA infections.     RADIOLOGY:  No results found.  EKG:   Orders placed or performed during the hospital encounter of 11/14/15  . ED EKG  . ED EKG      Management plans discussed with the patient, family and they are in agreement.  CODE STATUS:  Code Status History    Date Active Date Inactive Code Status Order ID Comments User Context   11/15/2015  1:50 PM 11/16/2015  7:49 PM DNR [id number]  Knox Royalty, NP Inpatient   11/14/2015  9:49 AM 11/15/2015  7:00 AM Full Code [id number]  Saundra Shelling, MD Inpatient   10/31/2015  5:51 PM 11/02/2015 12:06 PM Full Code [id number]  Henreitta Leber, MD Inpatient   10/24/2015 10:09 AM 10/25/2015  4:43 PM Full Code [id number]  Harrie Foreman, MD ED   08/10/2015  1:44 AM 08/12/2015  4:45 PM Full Code [id number]  Lance Coon, MD Inpatient   07/29/2015  5:56 PM 07/30/2015  4:42 PM Full Code [id number]  Gladstone Lighter, MD Inpatient   07/11/2015  5:25 PM 07/19/2015  6:21 PM Full Code [id number]  Henreitta Leber, MD Inpatient   06/25/2015  6:35 PM 06/27/2015  4:00 PM Full Code [id number]  Theodoro Grist, MD ED    Questions for Most Recent Historical Code Status (Order [id number])    Question Answer Comment   In the event of cardiac or respiratory ARREST Do not call a "code blue"    In the event of cardiac or respiratory ARREST Do not perform Intubation, CPR, defibrillation or ACLS    In the event of cardiac or respiratory ARREST Use medication by any route, position, wound care, and other measures to relive pain and suffering. May use oxygen, suction and manual treatment of airway obstruction as needed for comfort.       TOTAL TIME TAKING CARE OF THIS PATIENT: 35 minutes.    Vaughan Basta M.D on 11/29/2015 at 10:19  AM  Between 7am to 6pm -  Pager - [phone]  After 6pm go to [email or web address] - password [email or web address] [facility]  Office  [phone]  CC: Primary care physician; Kasandra Knudsen, NP   Note: This dictation was prepared with Dragon dictation along with smaller phrase technology. Any transcriptional errors that result from this process are unintentional.

## 2016-03-02 ENCOUNTER — Other Ambulatory Visit: Payer: Self-pay | Admitting: Nurse Practitioner

## 2016-06-10 IMAGING — CR DG CHEST 2V
1 series · 2 of 2 positions shown · non-contrast
Comparison: 08/09/2015 chest radiograph.

CLINICAL DATA: Pneumonia.  Lung cancer.

EXAM:
CHEST  2 VIEW

[Series 1: dg chest 2 view · 0.14mm/px · 2 of 2 slices shown]
[im 1/2]
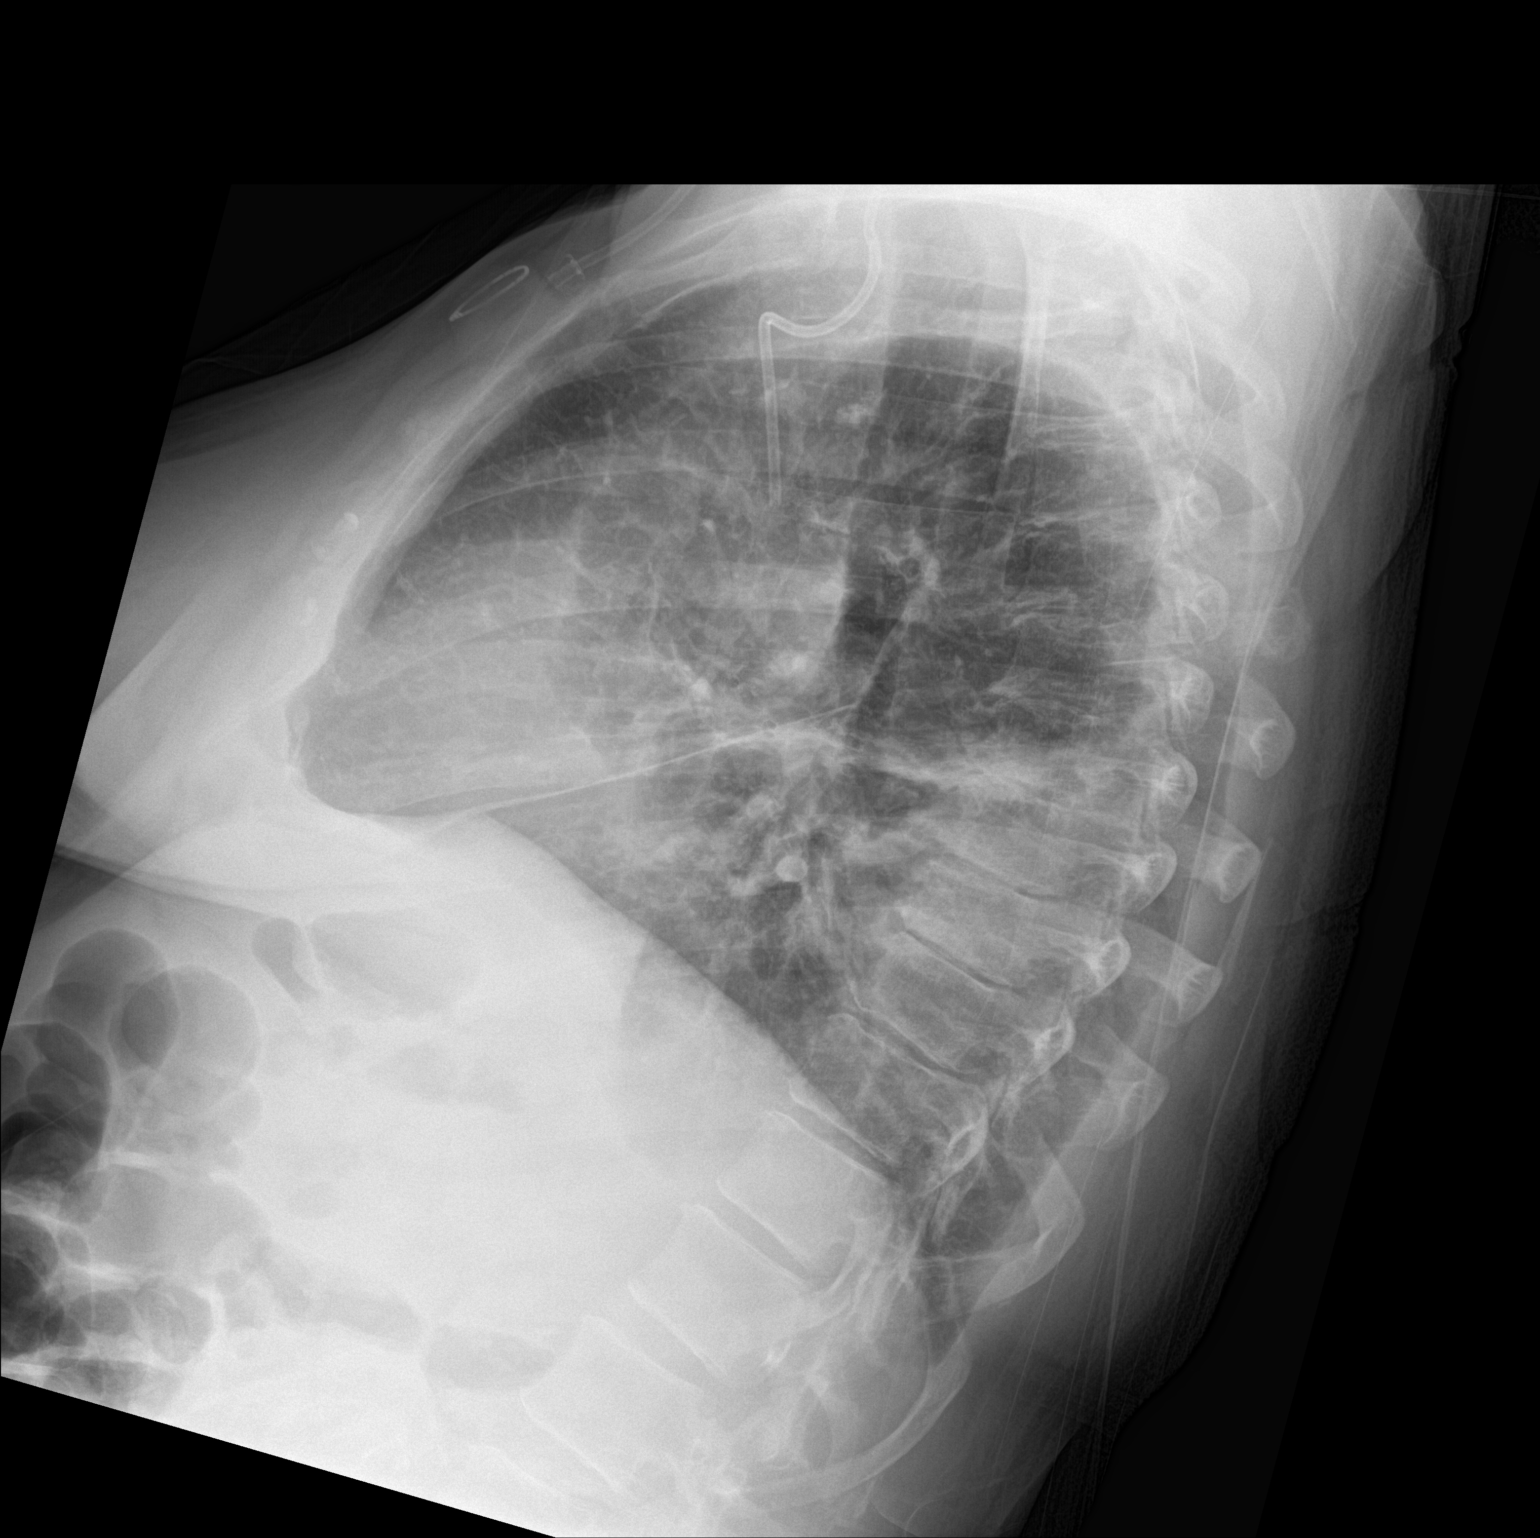
[im 2/2]
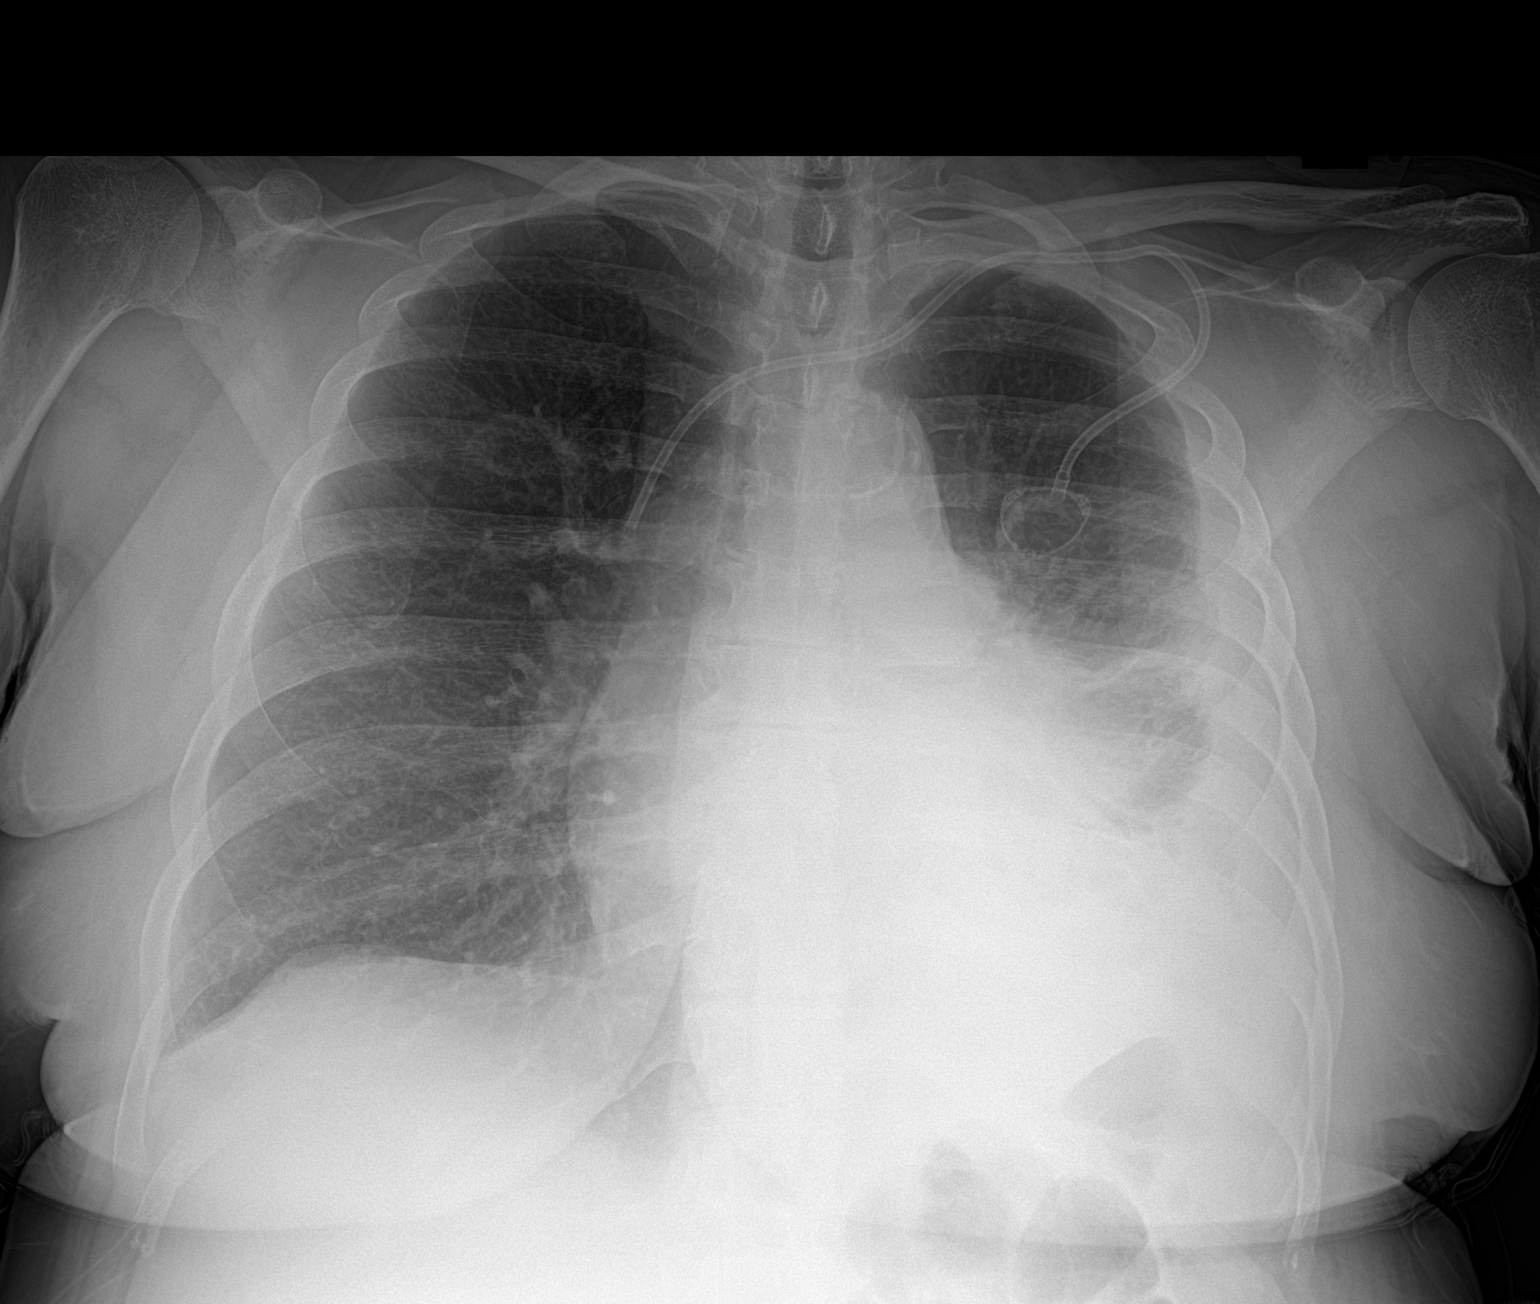

[2 of 2 positions shown; findings below may reference images not displayed]

FINDINGS: Left subclavian MediPort terminates in the upper third of the
superior vena cava. Stable cardiomediastinal silhouette with
top-normal heart size. No pneumothorax. Stable moderate left pleural
effusion. No right pleural effusion. Stable patchy consolidation at
the left lung base. No new consolidative airspace disease.
IMPRESSION: 1. Stable moderate left pleural effusion.
2. Stable patchy left lung base consolidation, either atelectasis,
pneumonia and/or tumor.

## 2016-08-22 IMAGING — CR DG CHEST 1V
1 series · 1 of 1 positions shown · non-contrast
Comparison: Preoperative chest x-ray 10/24/2015 at [DATE] a.m.

CLINICAL DATA: 59-year-old female status post left thoracentesis.

EXAM:
CHEST 1 VIEW

[x chest ap]
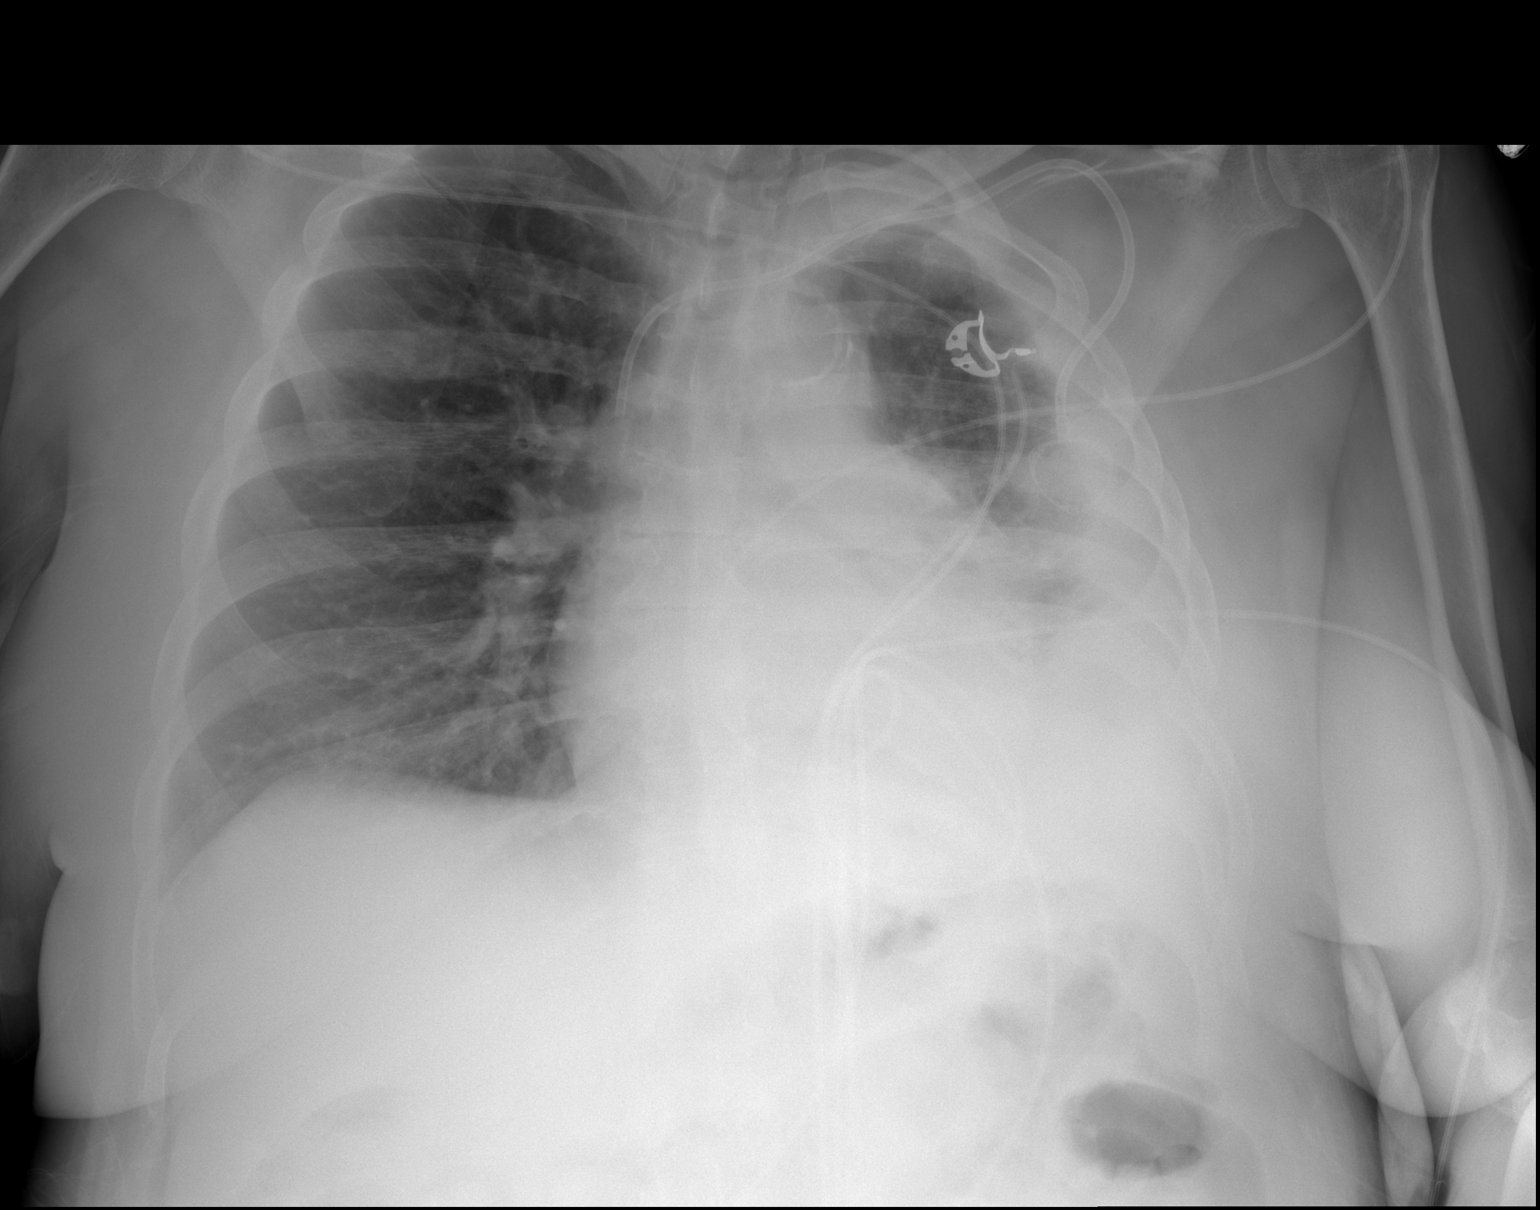

[1 of 1 positions shown; findings below may reference images not displayed]

FINDINGS: Stable position of left subclavian approach single-lumen power
injectable port catheter with the catheter tip overlying the mid
SVC. Stable extensive left pleural thickening and loculated left
lower lobe pleural effusion. No evidence of pneumothorax. Persistent
volume loss in the left hemi thorax. The cardiac and mediastinal
contours remain unchanged. Aortic atherosclerosis is similar
compared to prior. Slightly low inspiratory volumes with right
basilar atelectasis. No acute osseous abnormality. Lytic lesion in
the lateral aspect of the left seventh rib.
IMPRESSION: No evidence of pneumothorax status post left thoracentesis.

## 2016-08-22 IMAGING — CR DG CHEST 2V
1 series · 2 of 2 positions shown · non-contrast
Comparison: Chest radiograph August 12, 2015

CLINICAL DATA: Severe LEFT lower back pain beginning last week,
with vomiting and diaphoresis. History of lung cancer, on
chemotherapy.

EXAM:
CHEST  2 VIEW

[Series 1: dg chest 2 view · 0.14mm/px · 2 of 2 slices shown]
[im 1/2]
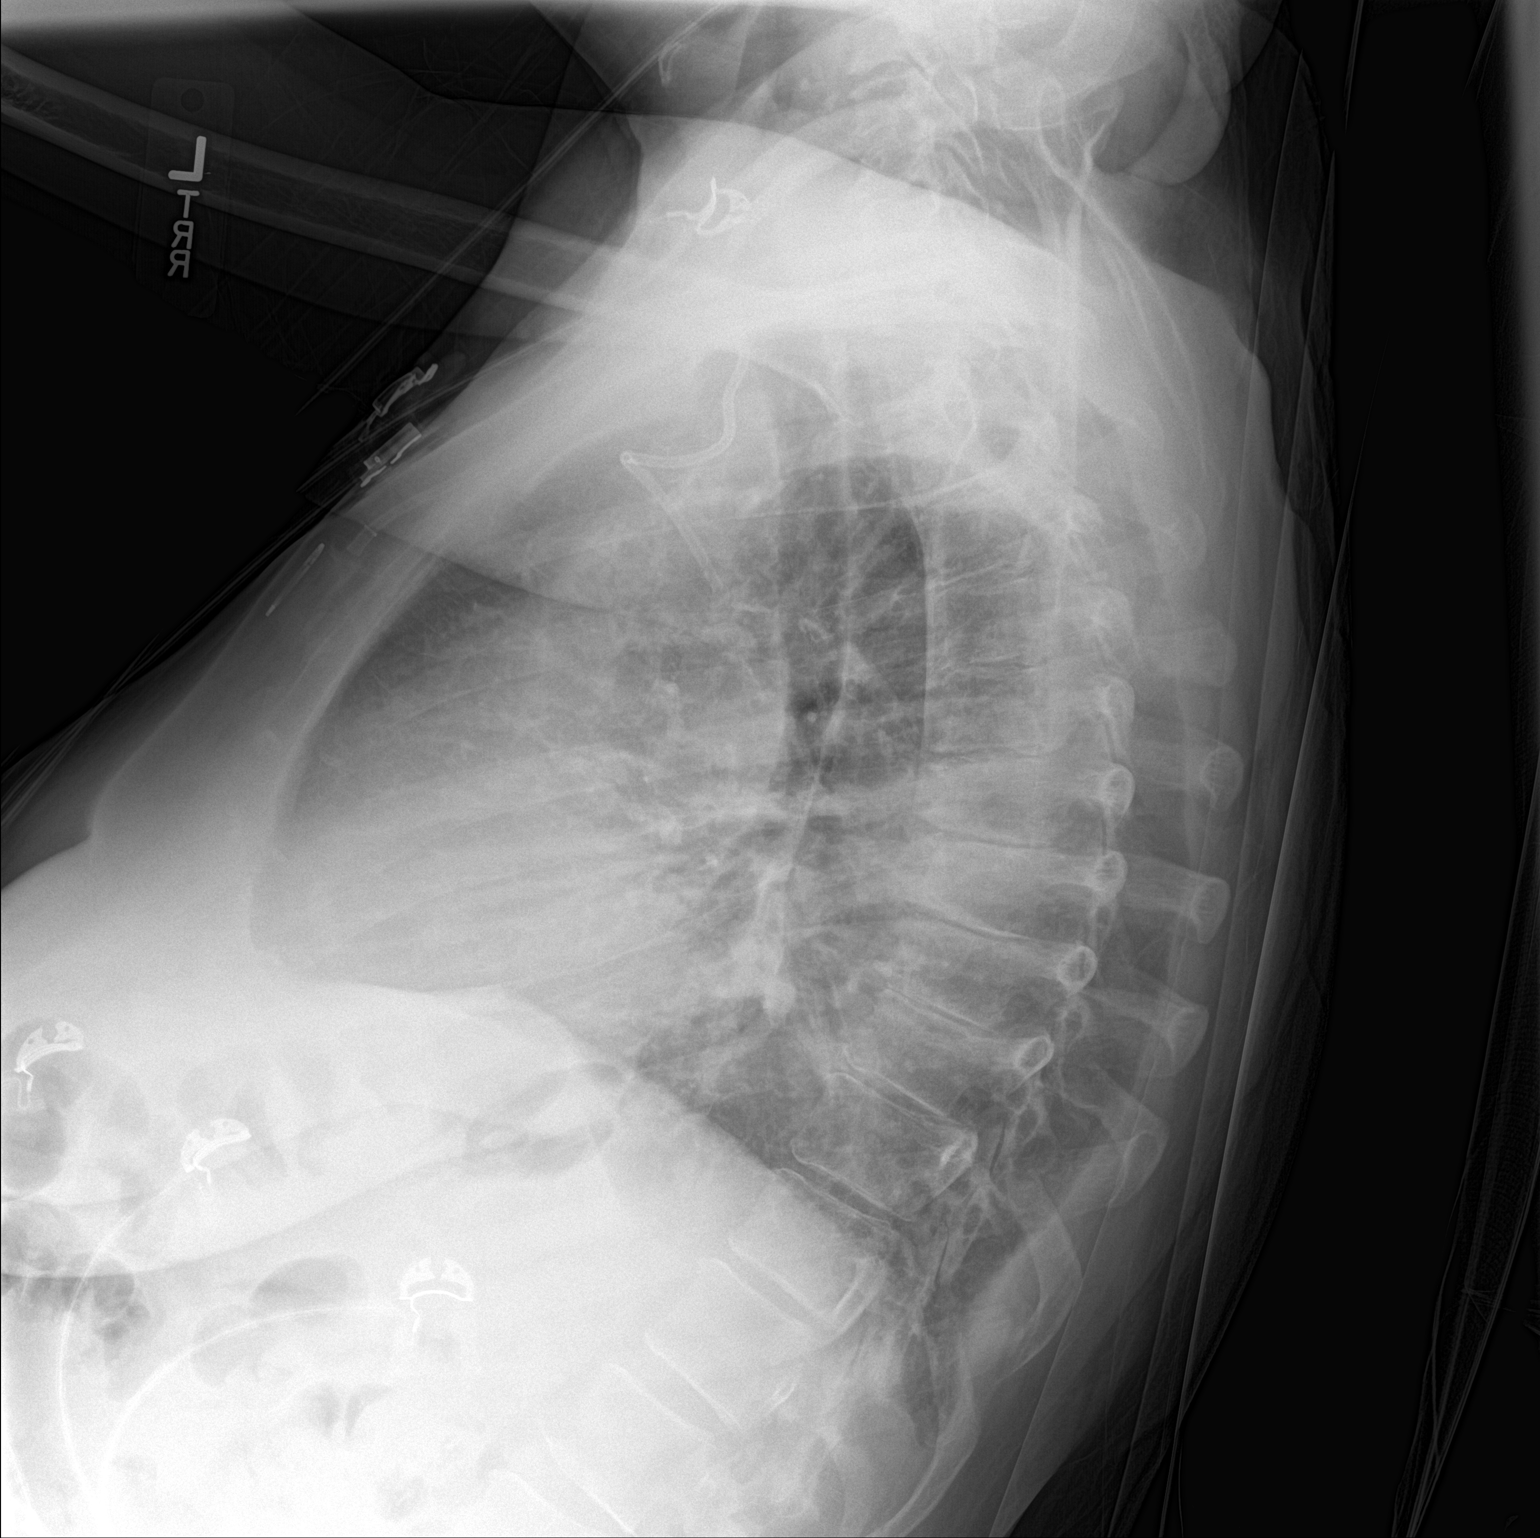
[im 2/2]
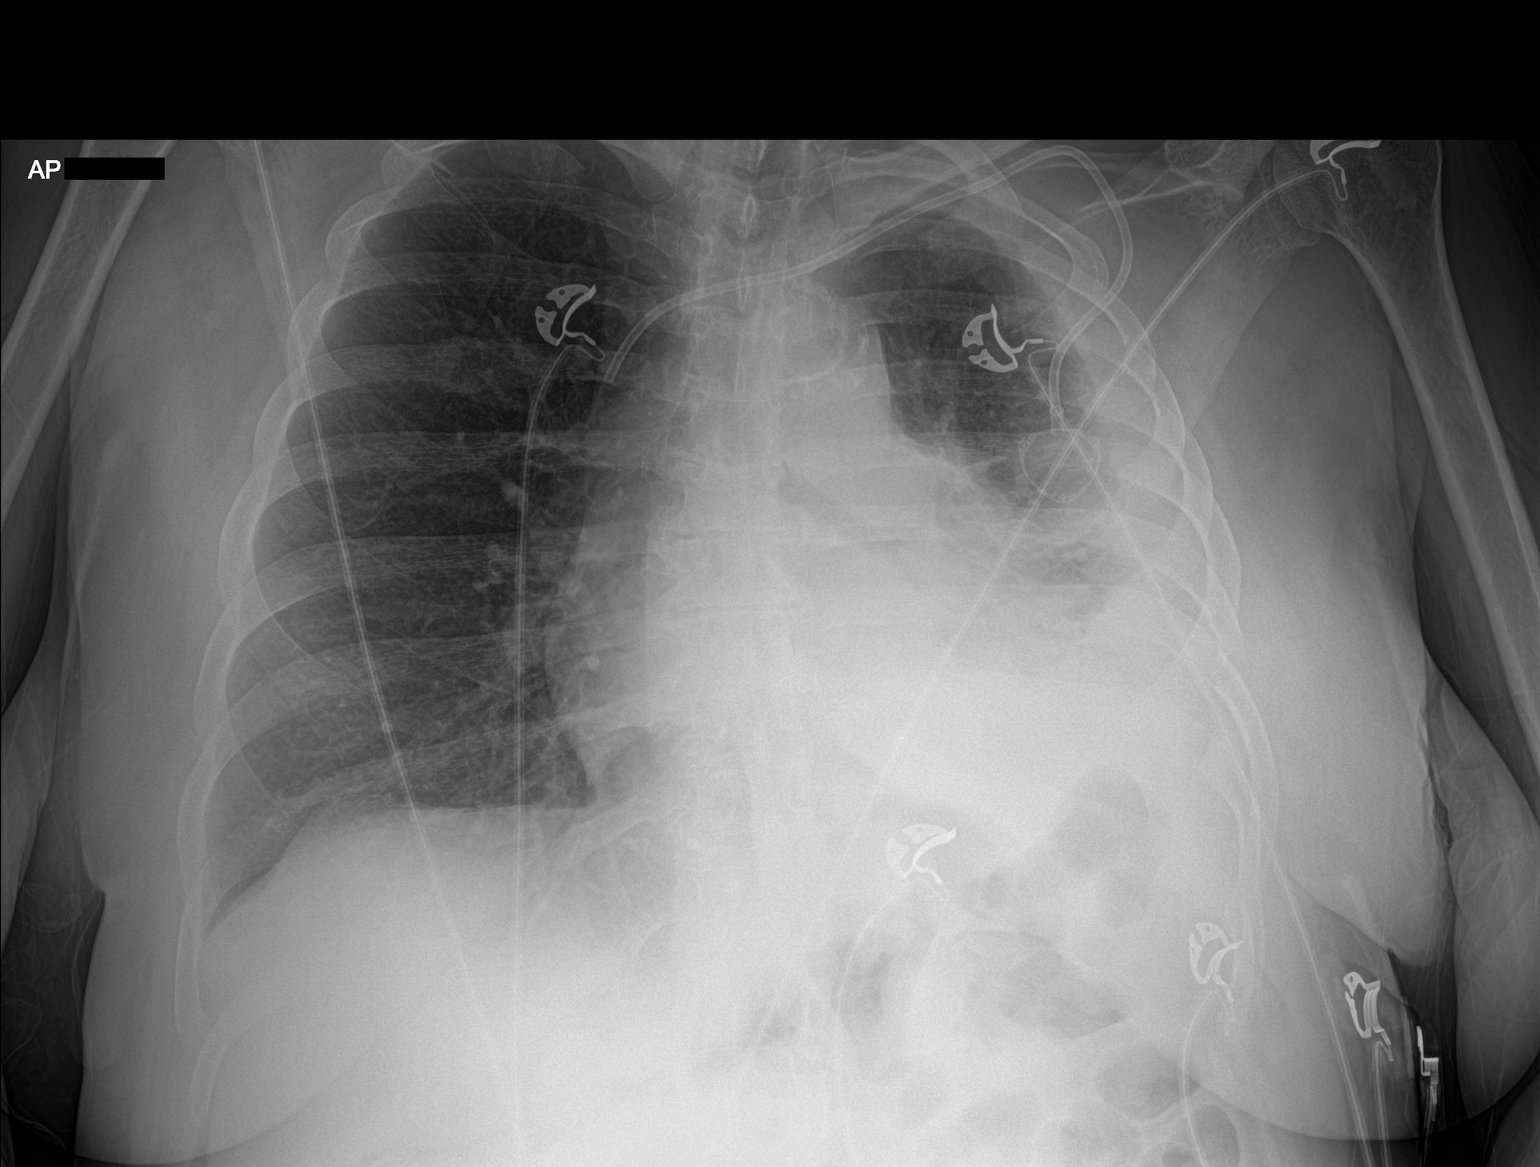

[2 of 2 positions shown; findings below may reference images not displayed]

FINDINGS: Large LEFT pleural effusion, increased from prior examination with
underlying consolidation/mass. RIGHT lung is clear. The cardiac
silhouette is mildly enlarged. Calcified aortic knob. Single lumen
LEFT chest port with distal tip projecting in proximal superior vena
cava. No pneumothorax. Soft tissue planes included osseous
structures are nonsuspicious.
IMPRESSION: Large LEFT pleural effusion with underlying consolidation/mass.

## 2016-09-12 IMAGING — CR DG THORACIC SPINE 2V
1 series · 3 of 3 positions shown · non-contrast
Comparison: Chest CT dated 10/31/2015. Concurrent radiographs of
the chest.

CLINICAL DATA: 59 y/o F; lung cancer with metastasis to the spine.
Back pain without relief.

EXAM:
THORACIC SPINE 2 VIEWS

[Series 1: dg thoracic spine 2 view · 0.14mm/px · 3 of 3 slices shown]
[im 1/3]
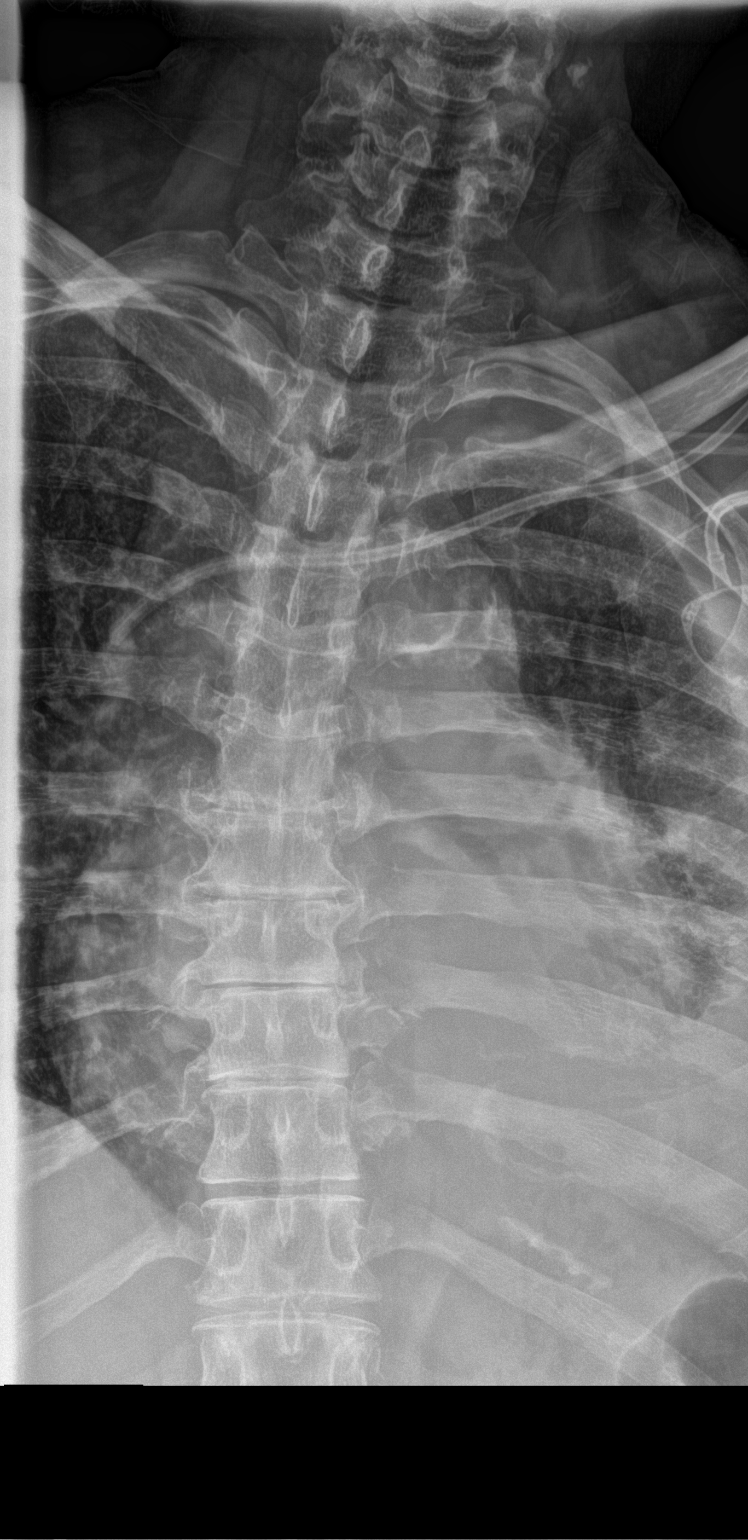
[im 2/3]
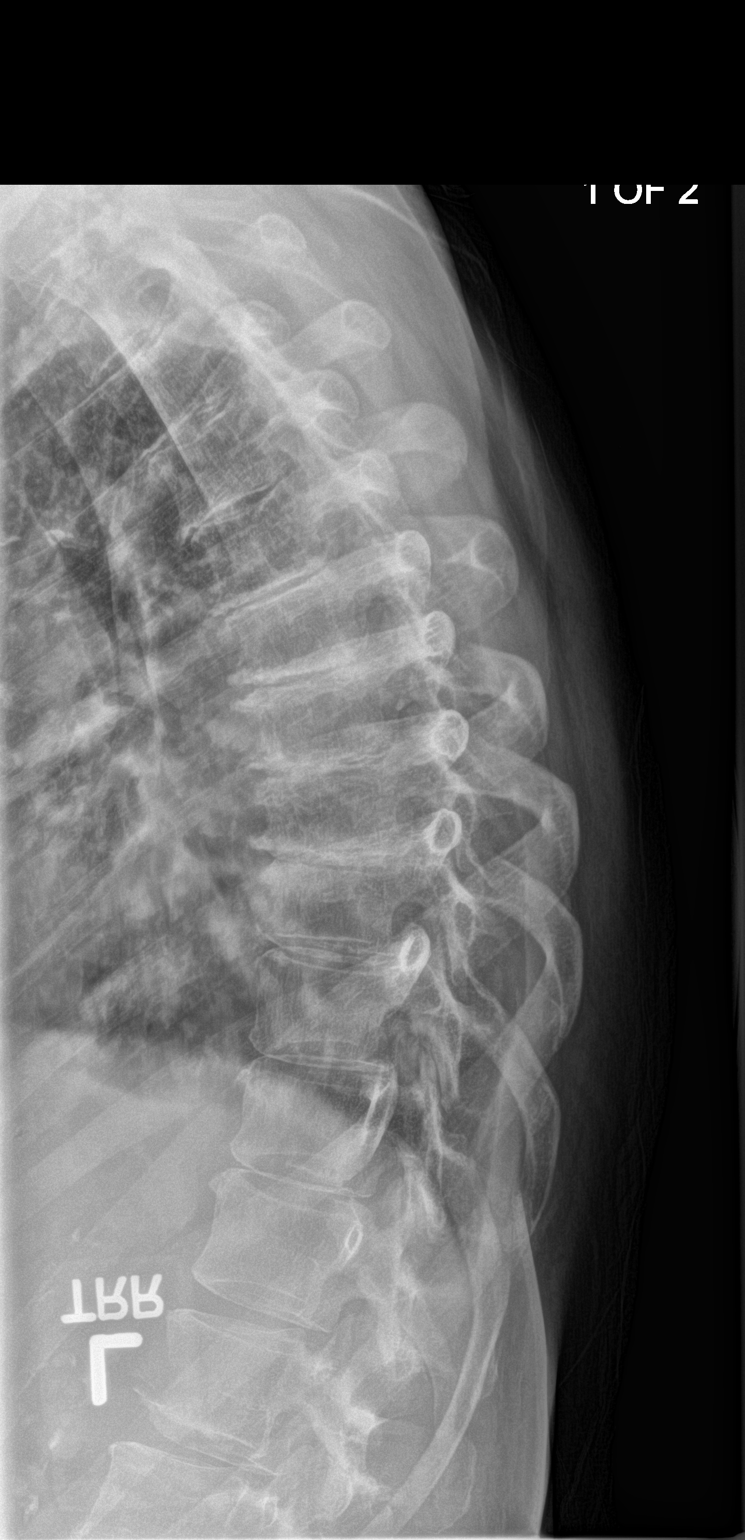
[im 3/3]
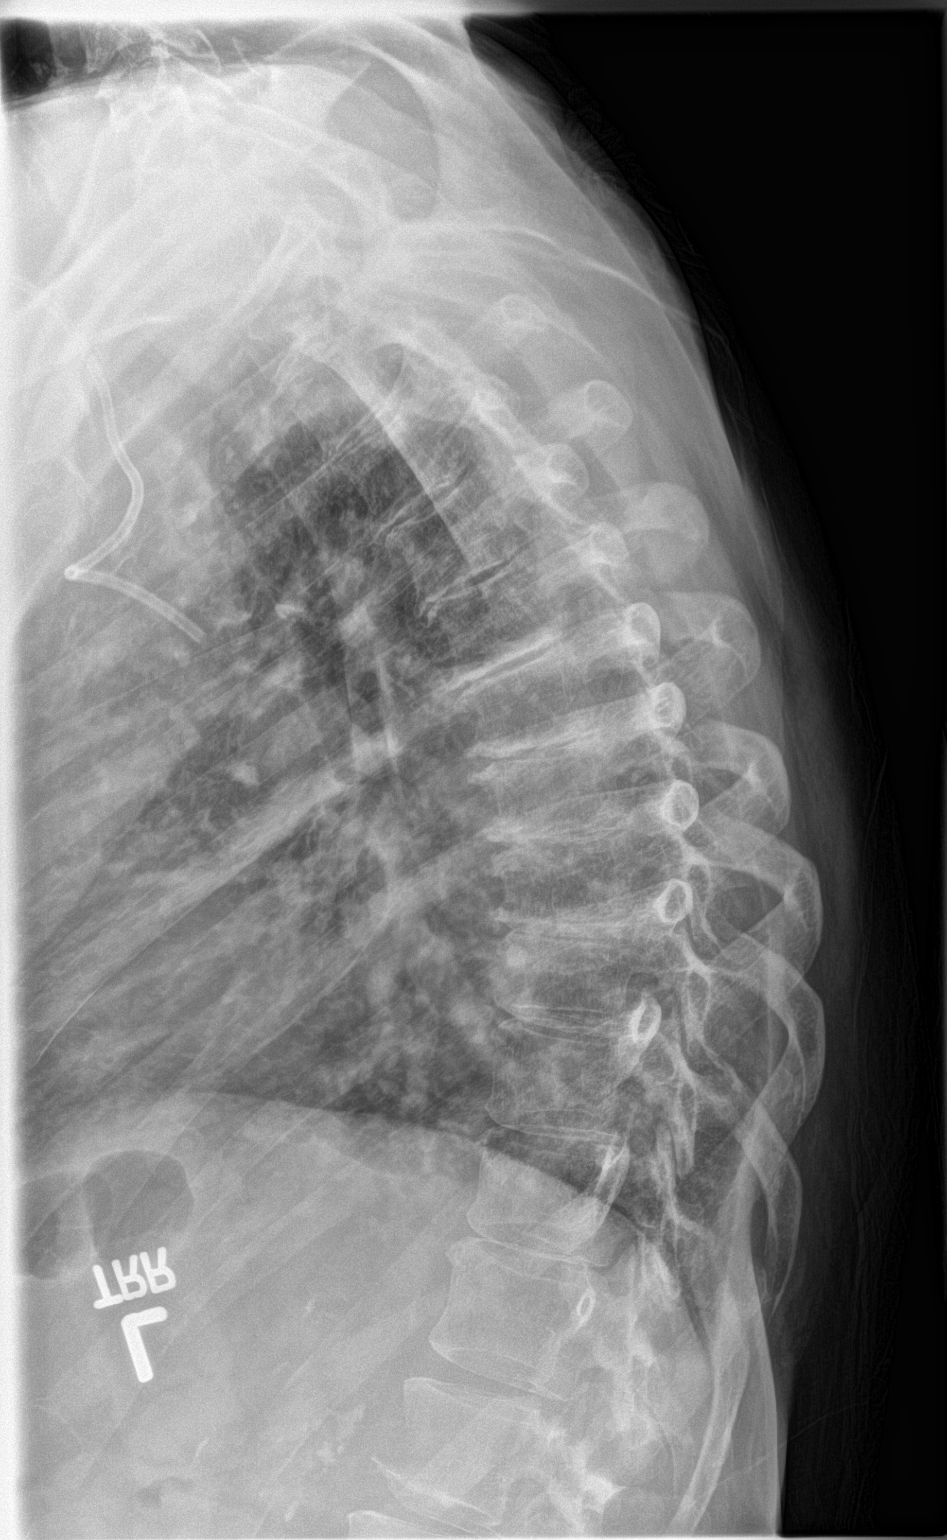

[3 of 3 positions shown; findings below may reference images not displayed]

FINDINGS: Vertebral body heights are preserved. No acute fracture is
identified. Multiple osseous lesions are better characterized on the
prior CT of the chest. Port catheter with tip projecting over the
mid SVC. Moderate degenerative changes of the mid thoracic spine.
IMPRESSION: No acute fracture or dislocation is identified. Multiple osseous
metastasis are better characterized on the prior CT of the chest.

By: Cerciz Gachechiladze M.D.

## 2016-09-12 IMAGING — CR DG CHEST 2V
1 series · 2 of 2 positions shown · non-contrast
Comparison: Concurrent thoracic spine radiographs. Chest CT dated
10/31/2015.

CLINICAL DATA: 59 y/o F; lung cancer with metastasis to spine. Back
pain without relief.

EXAM:
CHEST  2 VIEW

[Series 1: dg chest 2 view · 0.14mm/px · 2 of 2 slices shown]
[im 1/2]
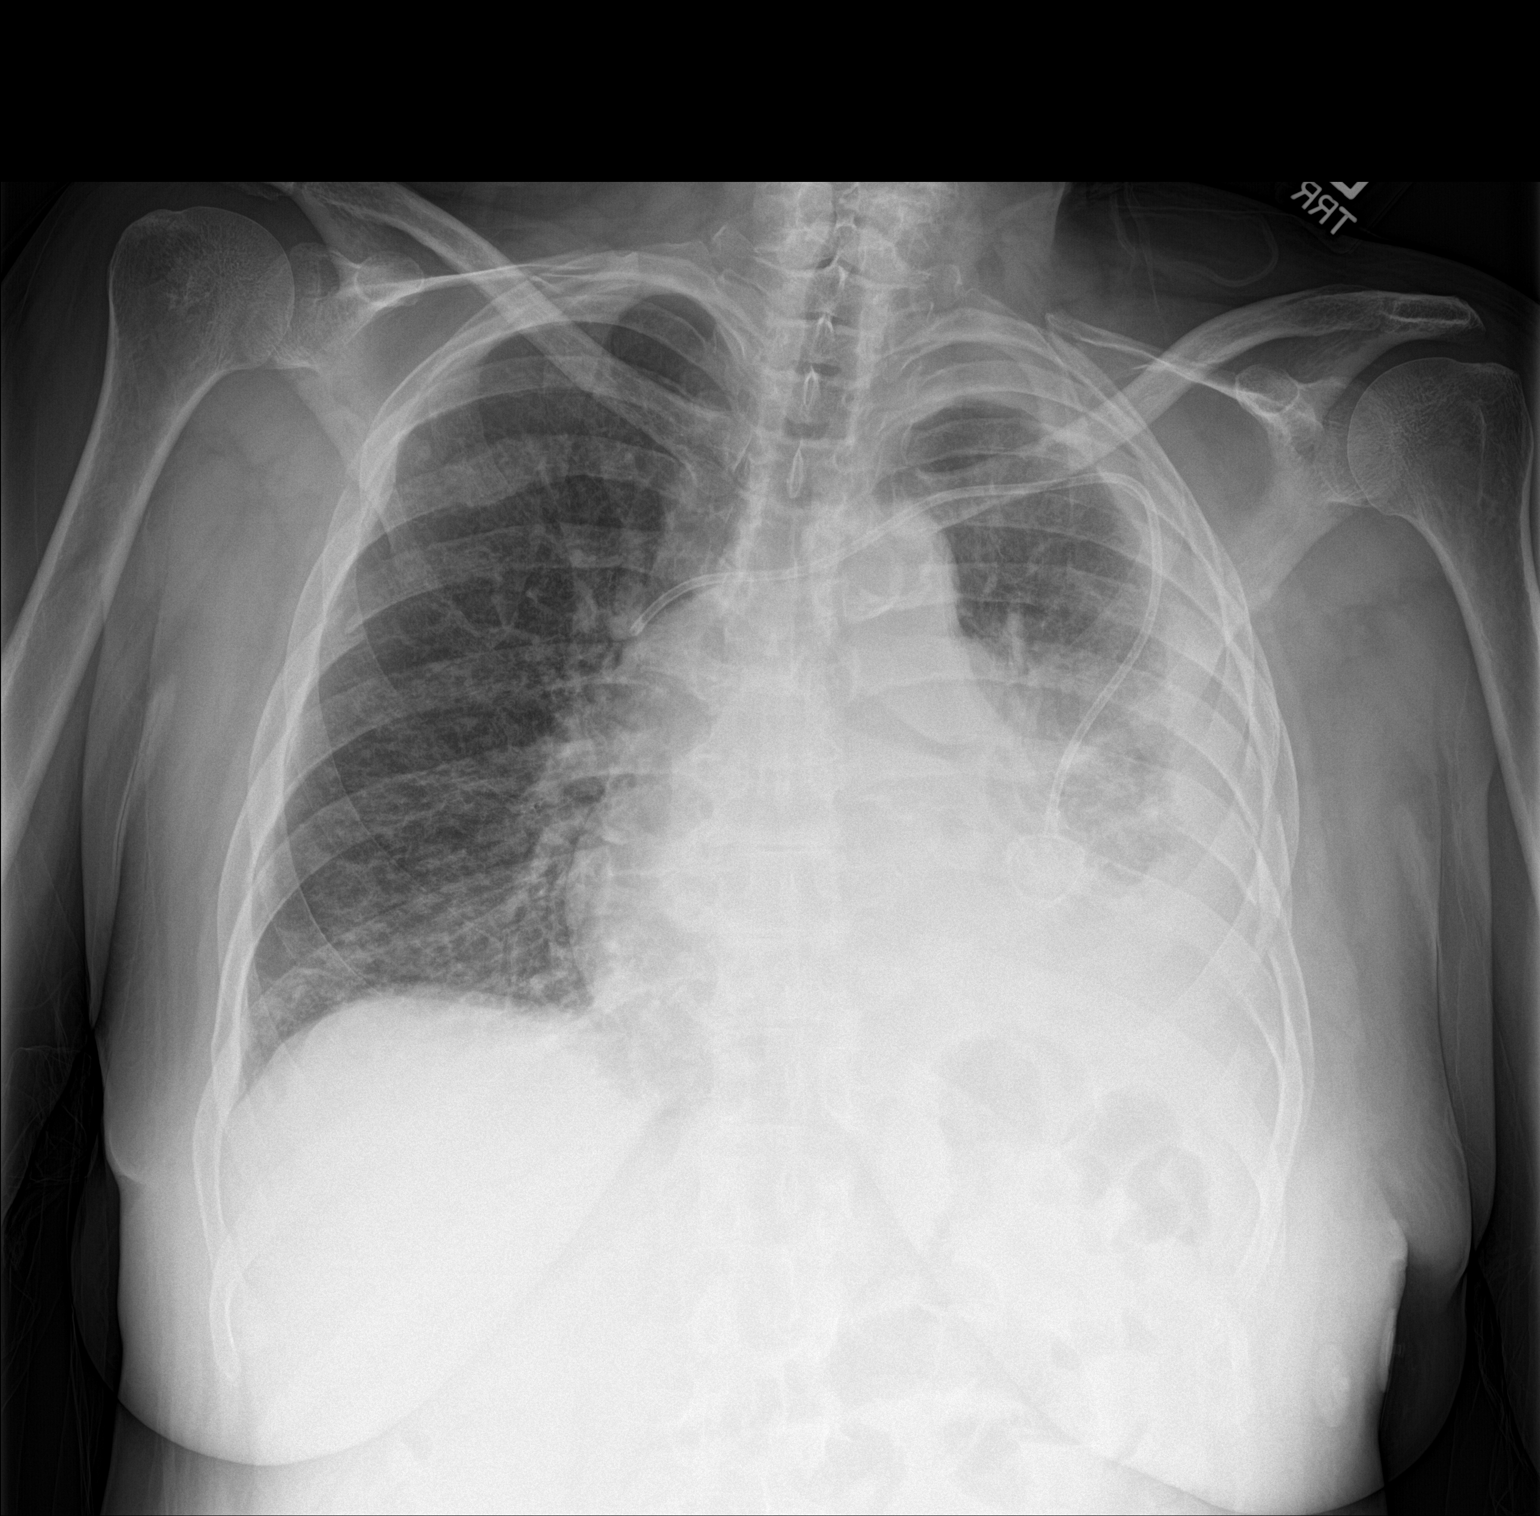
[im 2/2]
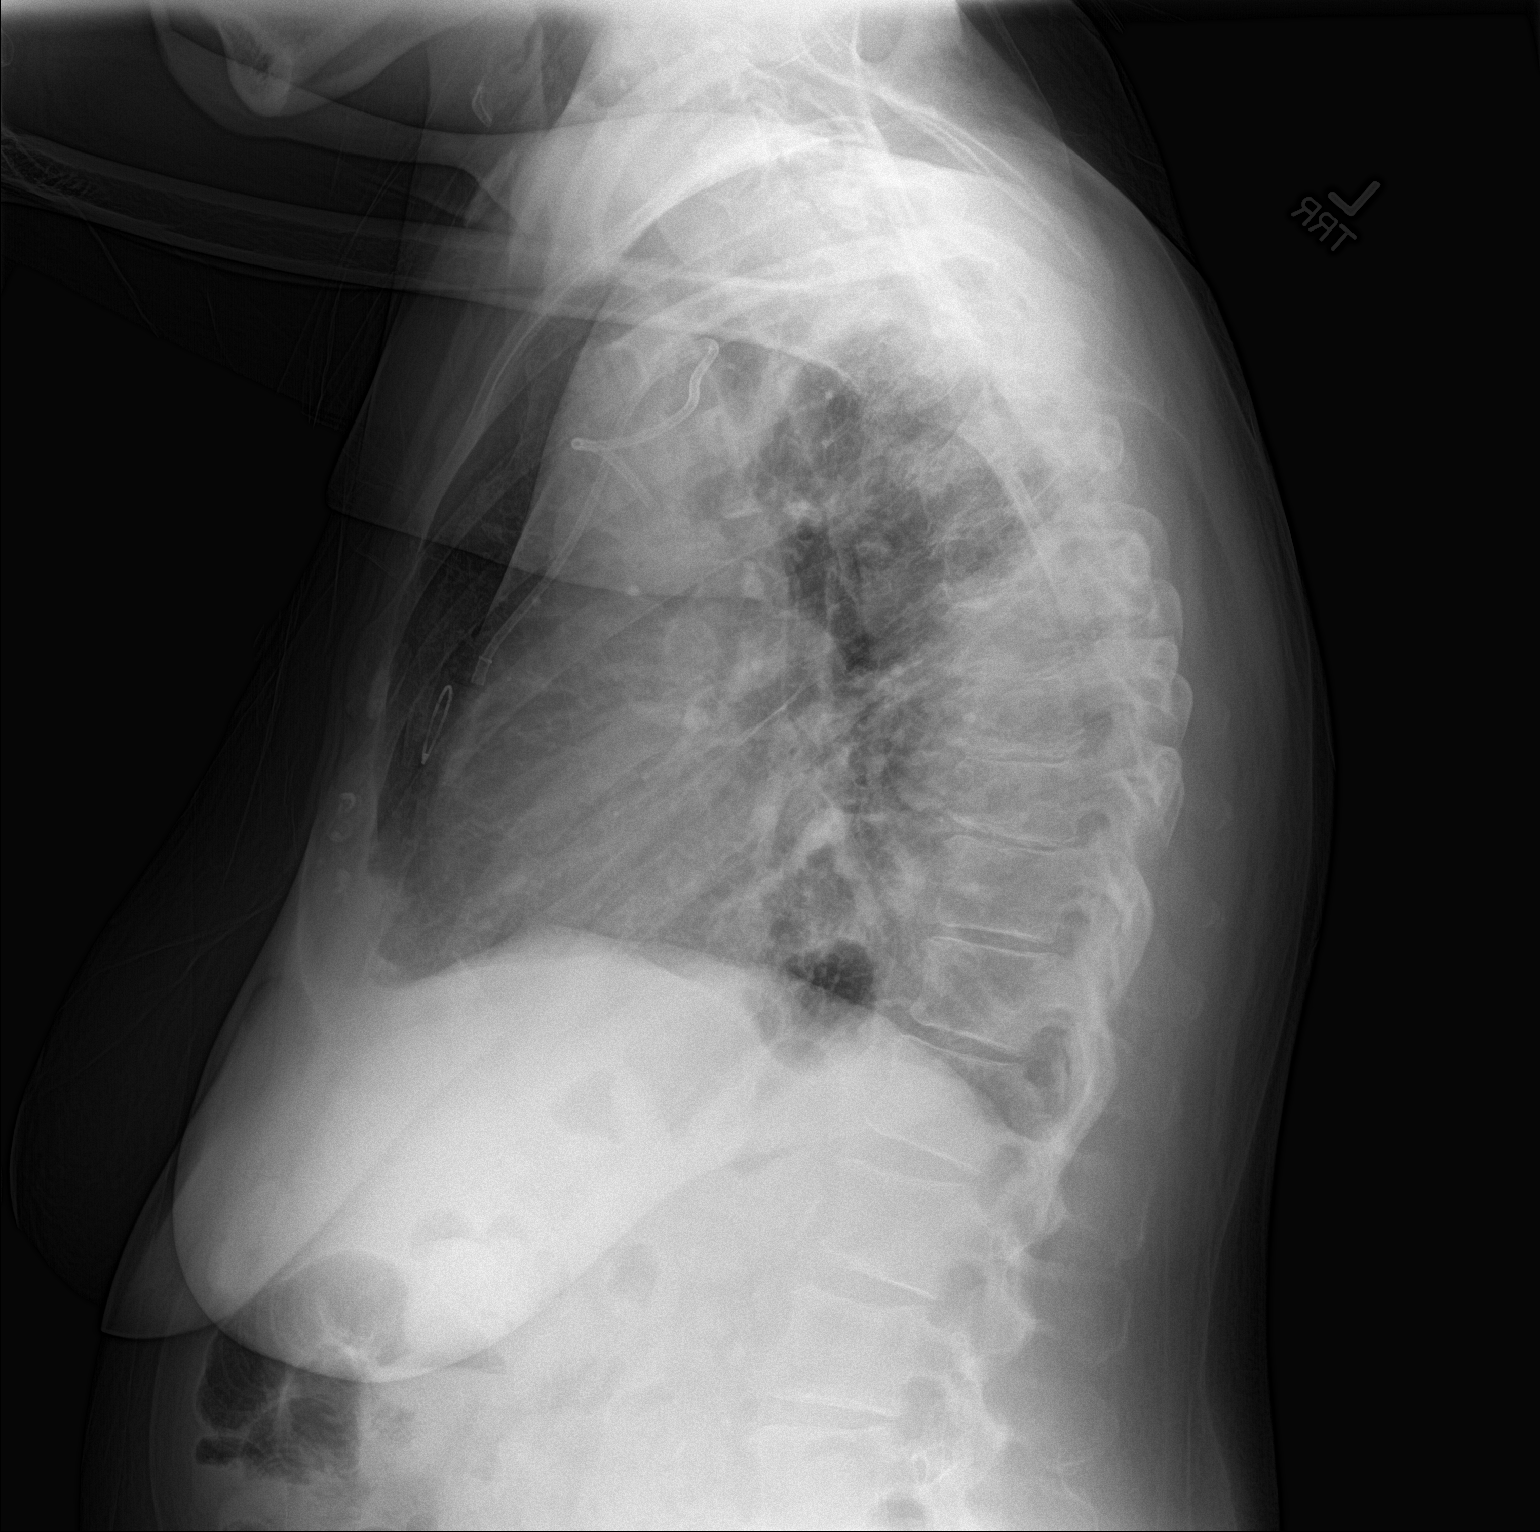

[2 of 2 positions shown; findings below may reference images not displayed]

FINDINGS: Elevated left hemidiaphragm and consolidation of the left mid and
lower lung zones as well as diffuse left-sided pleural thickening is
without significant interval change in comparison with the prior CT
of the chest. Cardiac silhouette is stable. Port catheter with tip
projecting over the mid SVC. No consolidation in the right lung.
There are several lucencies in the ribs bilaterally corresponding to
osseous metastasis on prior CT.

New mildly displaced right lateral fourth and sixth rib fractures.
Healed right posterior fifth rib fracture. Previously identified
pathologic fractures at right 10 and left 3 and 7 are not clearly
appreciated.
IMPRESSION: 1. Stable elevated left hemidiaphragm, left mid and lower lung zone
consolidations, and left-sided pleural thickening.
2. Mildly displaced right lateral T4 and T6 rib fractures are new.
Additional pathologic fractures identified on prior CT may are not
clearly appreciated.

By: Reed Strayer M.D.
